# Patient Record
Sex: Male | Born: 1945
Health system: Southern US, Community
[De-identification: ages and names within clinical notes are randomized; demographics above are authoritative.]

## PROBLEM LIST (undated history)

## (undated) DIAGNOSIS — C44300 Unspecified malignant neoplasm of skin of unspecified part of face: Secondary | ICD-10-CM

## (undated) DIAGNOSIS — E785 Hyperlipidemia, unspecified: Secondary | ICD-10-CM

## (undated) DIAGNOSIS — I509 Heart failure, unspecified: Secondary | ICD-10-CM

## (undated) DIAGNOSIS — I4891 Unspecified atrial fibrillation: Secondary | ICD-10-CM

## (undated) DIAGNOSIS — N529 Male erectile dysfunction, unspecified: Secondary | ICD-10-CM

## (undated) DIAGNOSIS — I10 Essential (primary) hypertension: Secondary | ICD-10-CM

## (undated) DIAGNOSIS — K7460 Unspecified cirrhosis of liver: Secondary | ICD-10-CM

## (undated) DIAGNOSIS — R739 Hyperglycemia, unspecified: Secondary | ICD-10-CM

## (undated) DIAGNOSIS — K648 Other hemorrhoids: Secondary | ICD-10-CM

## (undated) DIAGNOSIS — C4492 Squamous cell carcinoma of skin, unspecified: Secondary | ICD-10-CM

## (undated) DIAGNOSIS — I219 Acute myocardial infarction, unspecified: Secondary | ICD-10-CM

## (undated) DIAGNOSIS — E079 Disorder of thyroid, unspecified: Secondary | ICD-10-CM

## (undated) HISTORY — DX: Hyperglycemia, unspecified: R73.9

## (undated) HISTORY — DX: Squamous cell carcinoma of skin, unspecified: C44.92

## (undated) HISTORY — DX: Essential (primary) hypertension: I10

## (undated) HISTORY — DX: Unspecified cirrhosis of liver: K74.60

## (undated) HISTORY — DX: Male erectile dysfunction, unspecified: N52.9

## (undated) HISTORY — DX: Hyperlipidemia, unspecified: E78.5

## (undated) HISTORY — DX: Acute myocardial infarction, unspecified: I21.9

## (undated) HISTORY — DX: Heart failure, unspecified: I50.9

## (undated) HISTORY — DX: Disorder of thyroid, unspecified: E07.9

## (undated) HISTORY — DX: Other hemorrhoids: K64.8

## (undated) HISTORY — DX: Unspecified malignant neoplasm of skin of unspecified part of face: C44.300

## (undated) HISTORY — DX: Unspecified atrial fibrillation: I48.91

---

## 1999-02-06 ENCOUNTER — Encounter: Payer: Self-pay | Admitting: Family Medicine

## 1999-02-06 LAB — CONVERTED CEMR LAB: PSA: 1.7 ng/mL

## 1999-03-31 LAB — HM COLONOSCOPY: HM Colonoscopy: NEGATIVE

## 1999-04-10 ENCOUNTER — Other Ambulatory Visit: Admission: RE | Admit: 1999-04-10 | Discharge: 1999-04-10 | Payer: Self-pay | Admitting: Gastroenterology

## 1999-04-10 ENCOUNTER — Encounter (INDEPENDENT_AMBULATORY_CARE_PROVIDER_SITE_OTHER): Payer: Self-pay | Admitting: Specialist

## 2006-10-29 HISTORY — PX: OTHER SURGICAL HISTORY: SHX169

## 2006-10-30 ENCOUNTER — Other Ambulatory Visit: Payer: Self-pay

## 2006-10-30 ENCOUNTER — Inpatient Hospital Stay: Payer: Self-pay | Admitting: Internal Medicine

## 2006-10-31 ENCOUNTER — Other Ambulatory Visit: Payer: Self-pay

## 2006-10-31 HISTORY — PX: DOPPLER ECHOCARDIOGRAPHY: SHX263

## 2006-11-08 ENCOUNTER — Ambulatory Visit: Payer: Self-pay | Admitting: Family Medicine

## 2006-11-08 LAB — CONVERTED CEMR LAB
Albumin: 3.8 g/dL (ref 3.5–5.2)
Basophils Absolute: 0.1 10*3/uL (ref 0.0–0.1)
Bilirubin, Direct: 0.2 mg/dL (ref 0.0–0.3)
Creatinine, Ser: 1.2 mg/dL (ref 0.4–1.5)
Eosinophils Absolute: 0.3 10*3/uL (ref 0.0–0.6)
Eosinophils Relative: 3.7 % (ref 0.0–5.0)
Glucose, Bld: 102 mg/dL — ABNORMAL HIGH (ref 70–99)
HCT: 44.9 % (ref 39.0–52.0)
HDL: 23.4 mg/dL — ABNORMAL LOW (ref >39.0)
Hemoglobin: 15.2 g/dL (ref 13.0–17.0)
MCHC: 33.9 g/dL (ref 30.0–36.0)
MCV: 89.9 fL (ref 78.0–100.0)
Magnesium: 2.3 mg/dL (ref 1.5–2.5)
Monocytes Absolute: 0.7 10*3/uL (ref 0.2–0.7)
Neutrophils Relative %: 61.3 % (ref 43.0–77.0)
PSA: 0.87 ng/mL (ref 0.10–4.00)
Potassium: 4.7 meq/L (ref 3.5–5.1)
RDW: 12.7 % (ref 11.5–14.6)
Sodium: 140 meq/L (ref 135–145)
Total Bilirubin: 1.4 mg/dL — ABNORMAL HIGH (ref 0.3–1.2)
Total Protein: 6.6 g/dL (ref 6.0–8.3)
Triglycerides: 129 mg/dL (ref 0–149)

## 2007-01-12 ENCOUNTER — Encounter: Payer: Self-pay | Admitting: Family Medicine

## 2007-01-12 DIAGNOSIS — I509 Heart failure, unspecified: Secondary | ICD-10-CM | POA: Insufficient documentation

## 2007-01-12 DIAGNOSIS — F528 Other sexual dysfunction not due to a substance or known physiological condition: Secondary | ICD-10-CM | POA: Insufficient documentation

## 2007-01-12 DIAGNOSIS — I4891 Unspecified atrial fibrillation: Secondary | ICD-10-CM | POA: Insufficient documentation

## 2007-02-01 ENCOUNTER — Ambulatory Visit: Payer: Self-pay | Admitting: Family Medicine

## 2007-02-01 DIAGNOSIS — I10 Essential (primary) hypertension: Secondary | ICD-10-CM | POA: Insufficient documentation

## 2007-02-22 ENCOUNTER — Ambulatory Visit: Payer: Self-pay | Admitting: Family Medicine

## 2007-02-22 LAB — CONVERTED CEMR LAB
CO2: 31 meq/L (ref 19–32)
Chloride: 108 meq/L (ref 96–112)
Creatinine, Ser: 1.2 mg/dL (ref 0.4–1.5)
Glucose, Bld: 100 mg/dL — ABNORMAL HIGH (ref 70–99)
Potassium: 4.7 meq/L (ref 3.5–5.1)
Sodium: 143 meq/L (ref 135–145)

## 2007-03-29 ENCOUNTER — Encounter: Payer: Self-pay | Admitting: Family Medicine

## 2007-05-24 ENCOUNTER — Ambulatory Visit: Payer: Self-pay | Admitting: Family Medicine

## 2007-08-16 ENCOUNTER — Ambulatory Visit: Payer: Self-pay | Admitting: Family Medicine

## 2007-08-16 LAB — CONVERTED CEMR LAB
CO2: 31 meq/L (ref 19–32)
Creatinine, Ser: 1 mg/dL (ref 0.4–1.5)
GFR calc Af Amer: 98 mL/min
Glucose, Bld: 105 mg/dL — ABNORMAL HIGH (ref 70–99)
Potassium: 4 meq/L (ref 3.5–5.1)
Sodium: 140 meq/L (ref 135–145)

## 2007-08-23 ENCOUNTER — Ambulatory Visit: Payer: Self-pay | Admitting: Family Medicine

## 2007-08-23 DIAGNOSIS — D485 Neoplasm of uncertain behavior of skin: Secondary | ICD-10-CM | POA: Insufficient documentation

## 2007-11-01 ENCOUNTER — Encounter: Payer: Self-pay | Admitting: Family Medicine

## 2007-12-20 ENCOUNTER — Ambulatory Visit: Payer: Self-pay | Admitting: Family Medicine

## 2007-12-20 LAB — CONVERTED CEMR LAB
ALT: 25 units/L (ref 0–53)
AST: 19 units/L (ref 0–37)
Albumin: 3.7 g/dL (ref 3.5–5.2)
Alkaline Phosphatase: 60 units/L (ref 39–117)
BUN: 18 mg/dL (ref 6–23)
Basophils Absolute: 0.1 10*3/uL (ref 0.0–0.1)
Basophils Relative: 0.9 % (ref 0.0–1.0)
Bilirubin, Direct: 0.2 mg/dL (ref 0.0–0.3)
CO2: 33 meq/L — ABNORMAL HIGH (ref 19–32)
Calcium: 9.2 mg/dL (ref 8.4–10.5)
Chloride: 100 meq/L (ref 96–112)
Cholesterol: 210 mg/dL (ref 0–200)
Creatinine, Ser: 1 mg/dL (ref 0.4–1.5)
Direct LDL: 127.3 mg/dL
Eosinophils Absolute: 0.4 10*3/uL (ref 0.0–0.6)
Eosinophils Relative: 5.1 % — ABNORMAL HIGH (ref 0.0–5.0)
GFR calc Af Amer: 98 mL/min
GFR calc non Af Amer: 81 mL/min
Glucose, Bld: 96 mg/dL (ref 70–99)
HCT: 42.5 % (ref 39.0–52.0)
HDL: 30 mg/dL — ABNORMAL LOW (ref >39.0)
Hemoglobin: 14 g/dL (ref 13.0–17.0)
Lymphocytes Relative: 26.3 % (ref 12.0–46.0)
MCHC: 33 g/dL (ref 30.0–36.0)
MCV: 89.2 fL (ref 78.0–100.0)
Monocytes Absolute: 0.6 10*3/uL (ref 0.2–0.7)
Monocytes Relative: 8.1 % (ref 3.0–11.0)
Neutro Abs: 4.6 10*3/uL (ref 1.4–7.7)
Neutrophils Relative %: 59.6 % (ref 43.0–77.0)
Platelets: 178 10*3/uL (ref 150–400)
Potassium: 3.9 meq/L (ref 3.5–5.1)
RBC: 4.77 M/uL (ref 4.22–5.81)
RDW: 12.6 % (ref 11.5–14.6)
Sodium: 139 meq/L (ref 135–145)
TSH: 5.1 microintl units/mL (ref 0.35–5.50)
Total Bilirubin: 2 mg/dL — ABNORMAL HIGH (ref 0.3–1.2)
Total CHOL/HDL Ratio: 7
Total Protein: 6.5 g/dL (ref 6.0–8.3)
Triglycerides: 149 mg/dL (ref 0–149)
VLDL: 30 mg/dL (ref 0–40)
WBC: 7.8 10*3/uL (ref 4.5–10.5)

## 2007-12-26 ENCOUNTER — Ambulatory Visit: Payer: Self-pay | Admitting: Family Medicine

## 2008-02-06 ENCOUNTER — Ambulatory Visit: Payer: Self-pay | Admitting: Family Medicine

## 2008-03-27 ENCOUNTER — Ambulatory Visit: Payer: Self-pay | Admitting: Family Medicine

## 2008-03-27 LAB — CONVERTED CEMR LAB
ALT: 26 units/L (ref 0–53)
AST: 21 units/L (ref 0–37)
HDL: 29.6 mg/dL — ABNORMAL LOW (ref >39.0)
Total CHOL/HDL Ratio: 5.6
VLDL: 37 mg/dL (ref 0–40)

## 2008-04-03 ENCOUNTER — Ambulatory Visit: Payer: Self-pay | Admitting: Family Medicine

## 2008-04-03 DIAGNOSIS — E7800 Pure hypercholesterolemia, unspecified: Secondary | ICD-10-CM | POA: Insufficient documentation

## 2008-05-07 ENCOUNTER — Encounter: Payer: Self-pay | Admitting: Family Medicine

## 2008-08-06 ENCOUNTER — Ambulatory Visit: Payer: Self-pay | Admitting: Family Medicine

## 2008-11-26 ENCOUNTER — Telehealth: Payer: Self-pay | Admitting: Family Medicine

## 2008-12-18 ENCOUNTER — Encounter: Payer: Self-pay | Admitting: Family Medicine

## 2009-01-01 ENCOUNTER — Encounter (INDEPENDENT_AMBULATORY_CARE_PROVIDER_SITE_OTHER): Payer: Self-pay | Admitting: *Deleted

## 2009-01-15 ENCOUNTER — Ambulatory Visit: Payer: Self-pay | Admitting: Family Medicine

## 2009-01-16 LAB — CONVERTED CEMR LAB
AST: 19 units/L (ref 0–37)
Albumin: 3.9 g/dL (ref 3.5–5.2)
BUN: 18 mg/dL (ref 6–23)
Basophils Absolute: 0 10*3/uL (ref 0.0–0.1)
CO2: 32 meq/L (ref 19–32)
Chloride: 103 meq/L (ref 96–112)
Direct LDL: 135.7 mg/dL
GFR calc non Af Amer: 72 mL/min (ref >60)
Glucose, Bld: 114 mg/dL — ABNORMAL HIGH (ref 70–99)
HCT: 42.6 % (ref 39.0–52.0)
Hemoglobin: 14.5 g/dL (ref 13.0–17.0)
Lymphs Abs: 2 10*3/uL (ref 0.7–4.0)
MCHC: 34 g/dL (ref 30.0–36.0)
MCV: 90.5 fL (ref 78.0–100.0)
Monocytes Absolute: 0.5 10*3/uL (ref 0.1–1.0)
Monocytes Relative: 6.6 % (ref 3.0–12.0)
Neutro Abs: 4.2 10*3/uL (ref 1.4–7.7)
Platelets: 166 10*3/uL (ref 150.0–400.0)
Potassium: 4 meq/L (ref 3.5–5.1)
Pro B Natriuretic peptide (BNP): 21 pg/mL (ref 0.0–100.0)
RDW: 12.4 % (ref 11.5–14.6)
Sodium: 140 meq/L (ref 135–145)
TSH: 5.65 microintl units/mL — ABNORMAL HIGH (ref 0.35–5.50)

## 2009-01-22 ENCOUNTER — Ambulatory Visit: Payer: Self-pay | Admitting: Family Medicine

## 2009-01-22 DIAGNOSIS — R739 Hyperglycemia, unspecified: Secondary | ICD-10-CM | POA: Insufficient documentation

## 2009-07-30 ENCOUNTER — Ambulatory Visit: Payer: Self-pay | Admitting: Family Medicine

## 2009-07-30 DIAGNOSIS — M65839 Other synovitis and tenosynovitis, unspecified forearm: Secondary | ICD-10-CM | POA: Insufficient documentation

## 2009-07-30 DIAGNOSIS — M65849 Other synovitis and tenosynovitis, unspecified hand: Secondary | ICD-10-CM

## 2010-01-21 ENCOUNTER — Encounter (INDEPENDENT_AMBULATORY_CARE_PROVIDER_SITE_OTHER): Payer: Self-pay | Admitting: *Deleted

## 2010-01-27 ENCOUNTER — Ambulatory Visit: Payer: Self-pay | Admitting: Family Medicine

## 2010-01-27 DIAGNOSIS — D126 Benign neoplasm of colon, unspecified: Secondary | ICD-10-CM | POA: Insufficient documentation

## 2010-02-04 ENCOUNTER — Ambulatory Visit: Payer: Self-pay | Admitting: Family Medicine

## 2010-02-05 LAB — CONVERTED CEMR LAB
ALT: 31 units/L (ref 0–53)
AST: 24 units/L (ref 0–37)
Albumin: 4.1 g/dL (ref 3.5–5.2)
Alkaline Phosphatase: 46 units/L (ref 39–117)
Chloride: 102 meq/L (ref 96–112)
Eosinophils Relative: 4.7 % (ref 0.0–5.0)
GFR calc non Af Amer: 77.41 mL/min (ref >60)
Glucose, Bld: 107 mg/dL — ABNORMAL HIGH (ref 70–99)
HCT: 40.8 % (ref 39.0–52.0)
Hemoglobin: 14.1 g/dL (ref 13.0–17.0)
LDL Cholesterol: 110 mg/dL — ABNORMAL HIGH (ref 0–99)
Lymphs Abs: 1.9 10*3/uL (ref 0.7–4.0)
Monocytes Relative: 8 % (ref 3.0–12.0)
Neutro Abs: 4.1 10*3/uL (ref 1.4–7.7)
Potassium: 4.1 meq/L (ref 3.5–5.1)
Sodium: 143 meq/L (ref 135–145)
Total Protein: 6.6 g/dL (ref 6.0–8.3)
VLDL: 29.8 mg/dL (ref 0.0–40.0)
WBC: 7 10*3/uL (ref 4.5–10.5)

## 2010-03-19 ENCOUNTER — Telehealth: Payer: Self-pay | Admitting: Family Medicine

## 2010-05-05 ENCOUNTER — Encounter (INDEPENDENT_AMBULATORY_CARE_PROVIDER_SITE_OTHER): Payer: Self-pay | Admitting: *Deleted

## 2010-06-30 ENCOUNTER — Encounter: Payer: Self-pay | Admitting: Internal Medicine

## 2010-09-25 ENCOUNTER — Telehealth: Payer: Self-pay | Admitting: Family Medicine

## 2010-10-28 NOTE — Letter (Signed)
Summary: Nadara Eaton letter  [facility]  [address] [place], [place] [address]   Phone: [phone]  Fax: [phone]       05/05/2010 MRN: [id number]  [facility] [address] [place], [place]  [address]  Dear Mr. Bluett,  [facility], and Eva announce the retirement of Arta Silence, M.D., from full-time practice at the [facility] office effective March 27, 2010 and his plans of returning part-time.  It is important to Dr. Hetty Ely and to our practice that you understand that [facility] has seven physicians in our office for your health care needs.  We will continue to offer the same exceptional care that you have today.    Dr. Hetty Ely has spoken to many of you about his plans for retirement and returning part-time in the fall.   We will continue to work with you through the transition to schedule appointments for you in the office and meet the high standards that [place] Dezza is committed to.   Again, it is with great pleasure that we share the news that Dr. Hetty Ely will return to [facility] in October of 2011 with a reduced schedule.    If you have any questions, or would like to request an appointment with one of our physicians, please call us at [phone] and press the option for Scheduling an appointment.  We take pleasure in providing you with excellent patient care and look forward to seeing you at your next office visit.  Our [facility] Physicians are:  Tillman Abide, M.D. Laurita Quint, M.D. Roxy Manns, M.D. Kerby Nora, M.D. Hannah Beat, M.D. Ruthe Mannan, M.D. We proudly welcomed Raechel Ache, M.D. and Eustaquio Boyden, M.D. to the practice in July/August 2011.  Sincerely,  [facility]

## 2010-10-28 NOTE — Assessment & Plan Note (Signed)
Summary: CPX/DLO   Vital Signs:  Patient profile:   65 year old male Height:      69 inches Weight:      193.50 pounds BMI:     28.68 Temp:     98.0 degrees F oral Pulse rate:   72 / minute Pulse rhythm:   irregular BP sitting:   120 / 70  (left arm) Cuff size:   regular  Vitals Entered By: Sydell Axon LPN (Jan 27, 1609 1:51 PM) CC: 30 Minute checkup, had a colonoscopy 10 years ago, due now   History of Present Illness: Pt here for Compm Exam. His son died last 2023-08-13..Marland KitchenLymphoma.  He feels well and has no complaints except his thumbs.   Preventive Screening-Counseling & Management  Alcohol-Tobacco     Alcohol drinks/day: <1     Alcohol type: Beer, occas wine     Smoking Status: never     Packs/Day: teen 2pyh     Passive Smoke Exposure: no  Caffeine-Diet-Exercise     Caffeine use/day: 2     Does Patient Exercise: yes     Type of exercise: walks 3-5 miles     Times/week: 4  Problems Prior to Update: 1)  Tendinitis, Left Thumb  (ICD-727.05) 2)  Hyperglycemia  (ICD-790.29) 3)  Hypercholesterolemia, Mixed  (ICD-272.0) 4)  Health Maintenance Exam  (ICD-V70.0) 5)  Neoplasm of Uncertain Behavior of Skin  (ICD-238.2) 6)  Hypertension, Benign Essential  (ICD-401.1) 7)  Alcohol Abuse, Hx Of, in Past, Quit 2/08  (ICD-V11.3) 8)  Erectile Dysfunction  (ICD-302.72) 9)  Hypertriglyceridemia/chol. 154/trig129/hdl28.4/ldl105  (ICD-272.1) 10)  Cardiomyopathy, Ef 25-35 %  (ICD-425.4) 11)  Congestive Heart Failure  (ICD-428.0) 12)  Atrial Fibrillation  (ICD-427.31)  Medications Prior to Update: 1)  Coreg 25 Mg Tabs (Carvedilol) .... One Tab By Mouth Two Times A Day 2)  Viagra 100 Mg  Tabs (Sildenafil Citrate) .... One Tab By Mouth One Hour Prior 3)  Hyzaar 100-25 Mg  Tabs (Losartan Potassium-Hctz) .[name].. 1 Daily By Mouth 4)  Furosemide 40 Mg  Tabs (Furosemide) .[name].. 1 Once Daily As Needed Edema 5)  Cardura 4 Mg  Tabs (Doxazosin Mesylate) .... Take One By Mouth Daily 6)  Eql Aspirin Ec  325 Mg  Tbec (Aspirin) .[name].. 1 Daily By Mouth 7)  Fish Oil 1200 Mg  Caps (Omega-3 Fatty Acids) .[name].. 1 Daily By Mouth 8)  Pravachol 40 Mg  Tabs (Pravastatin Sodium) .... One Tab By Mouth At Night 9)  Multivitamins   Tabs (Multiple Vitamin) .[name].. 1 Daily By Mouth 10)  Anusol-Hc 25 Mg Supp (Hydrocortisone Acetate) .... One Supp Per Rectum Three Times A Day As Needed.  Allergies: No Known Drug Allergies  Past History:  Past Medical History: Last updated: 01/12/2007 Atrial fibrillation Congestive heart failure  Past Surgical History: Last updated: 01/22/2009 Colonoscopy Polyp Bx. Negative, int. hemorrhoids 03/31/99 [facility] A-Fib Cardiomyopathy EF 25-35% 02/02-02/05/08 ECHO EF 25-35% Mod. M.R., mild-mod.TR 10/31/06  Family History: Last updated: 01/27/2010 Father dec 79 CHF S/P CABG(67) Pacer Incr BP Mother A 89 Htn Bilat Breast DZ s/p Mastectomy Bilat Colon Ca Brother A 60  Kathlene November)  Social History: Last updated: 01/27/2010 Occupation: [employer], Masonry business   [employer], [employer] Retired,Semi Married 2 sons   Son died 08-12-2009 from Lymphoma Former Smoker Alcohol use-yes abuse quit 10/2006 Alcohol use-no Drug use-no  Risk Factors: Alcohol Use: <1 (01/27/2010) Caffeine Use: 2 (01/27/2010) Exercise: yes (01/27/2010)  Risk Factors: Smoking Status: never (01/27/2010) Packs/Day:  teen 2pyh (01/27/2010) Passive Smoke Exposure: no (01/27/2010)  Family History: Father dec 79 CHF S/P CABG(67) Pacer Incr BP Mother A 89 Htn Bilat Breast DZ s/p Mastectomy Bilat Colon Ca Brother A 60  Kathlene November)  Social History: Occupation: Actor, Medical sales representative, [employer] Retired,Semi Married 2 sons   Son died 08/27/2009 from Lymphoma Former Smoker Alcohol use-yes abuse quit 10/2006 Alcohol use-no Drug use-no  Review of Systems General:  Complains of fatigue; denies chills, fever, sweats, weakness, and weight loss; mild. Eyes:   Complains of blurring; denies discharge and eye pain. ENT:  Denies decreased hearing, ear discharge, earache, and ringing in ears. CV:  Complains of shortness of breath with exertion; denies chest pain or discomfort, fainting, fatigue, palpitations, swelling of feet, and swelling of hands; depending on how much exertion. Resp:  Denies cough, shortness of breath, and wheezing. GI:  Complains of hemorrhoids; denies abdominal pain, bloody stools, change in bowel habits, constipation, dark tarry stools, diarrhea, indigestion, loss of appetite, nausea, vomiting, vomiting blood, and yellowish skin color; blood with hemms occas. GU:  Complains of nocturia; denies discharge, dysuria, and urinary frequency; stable. MS:  Denies joint pain, low back pain, muscle aches, and stiffness. Derm:  Denies dryness, itching, and rash; sees Derm. Neuro:  Denies numbness, poor balance, tingling, and tremors.  Physical Exam  General:  Well-developed,well-nourished,in no acute distress; alert,appropriate and cooperative throughout examination, pleasnt and conversant. Head:  Normocephalic and atraumatic without obvious abnormalities. No apparent alopecia or balding. Eyes:  Conjunctiva clear bilaterally.  Ears:  External ear exam shows no significant lesions or deformities.  Otoscopic examination reveals clear canals, tympanic membranes are intact bilaterally without bulging, retraction, inflammation or discharge. Hearing is grossly normal bilaterally. Nose:  External nasal examination shows no deformity or inflammation. Nasal mucosa are pink and moist without lesions or exudates. Mouth:  Oral mucosa and oropharynx without lesions or exudates.  Teeth in good repair. Neck:  No deformities, masses, or tenderness noted. Chest Wall:  No deformities, masses, tenderness or gynecomastia noted. Breasts:  No masses or gynecomastia noted Lungs:  Normal respiratory effort, chest expands symmetrically. Lungs are clear to  auscultation, no crackles or wheezes. Heart:  Irreg  rate and irregular rhythm. S1 and S2 normal without gallop, murmur, click, rub or other extra sounds. Abdomen:  Bowel sounds positive,abdomen soft and non-tender without masses, organomegaly or hernias noted. Rectal:  No external abnormalities noted. Normal sphincter tone. No rectal masses or tenderness. Mildly G pos. Mildly distended ext hemms. Genitalia:  Testes bilaterally descended without nodularity, tenderness or masses. No scrotal masses or lesions. No penis lesions or urethral discharge. Prostate:  Prostate gland firm and smooth, no enlargement, nodularity, tenderness, mass, asymmetry or induration. 20-30 gms. Msk:  No deformity or scoliosis noted of thoracic or lumbar spine.   Pulses:  R and L carotid,radial,femoral,dorsalis pedis and posterior tibial pulses are full and equal bilaterally Extremities:  Left thumb with prominence but no pain of the proximal head of the metacarpal. Tendons surrounding nonswollen but mildly tender. Neurologic:  No cranial nerve deficits noted. Station and gait are normal. Sensory, motor and coordinative functions appear intact. Skin:  Intact with multiple suspicious lesions. No  rash. He sees a Engineer, mining. Cervical Nodes:  No lymphadenopathy noted Inguinal Nodes:  No significant adenopathy Psych:  Cognition and judgment appear intact. Alert and cooperative with normal attention span and concentration. No apparent delusions, illusions, hallucinations. Mildly tearful talking about his son who died last Aug 28, 2023.  Impression & Recommendations:  Problem # 1:  HEALTH MAINTENANCE EXAM (ICD-V70.0) Assessment Comment Only  Reviewed preventive care protocols, scheduled due services, and updated immunizations. Will have labs done next week, report via phone tree.  Problem # 2:  COLONIC POLYPS (ICD-211.3) Assessment: Unchanged With mother's h/o colon ca and G pos today, refer to GI for redo  colonoscopy. Orders: Gastroenterology Referral (GI)  Problem # 3:  HYPERGLYCEMIA (ICD-790.29) Assessment: Unchanged  Will recheck fasting next week.  Labs Reviewed: Creat: 1.1 (01/15/2009)     Problem # 4:  HYPERCHOLESTEROLEMIA, MIXED (ICD-272.0) Assessment: Unchanged Will recheck next week. His updated medication list for this problem includes:    Pravachol 40 Mg Tabs (Pravastatin sodium) ..... One tab by mouth at night  Labs Reviewed: SGOT: 19 (01/15/2009)   SGPT: 26 (01/15/2009)   HDL:37.20 (01/15/2009), 29.6 (03/27/2008)  LDL:100 (03/27/2008), DEL (12/20/2007)  Chol:220 (01/15/2009), 166 (03/27/2008)  Trig:202.0 (01/15/2009), 184 (03/27/2008)  Problem # 5:  HYPERTENSION, BENIGN ESSENTIAL (ICD-401.1) Assessment: Improved Adequate. His updated medication list for this problem includes:    Coreg 25 Mg Tabs (Carvedilol) ..... One tab by mouth two times a day    Hyzaar 100-25 Mg Tabs (Losartan potassium-hctz) .[name]... 1 daily by mouth    Furosemide 40 Mg Tabs (Furosemide) .[name]... 1 once daily as needed edema    Cardura 4 Mg Tabs (Doxazosin mesylate) .[name]... Take one by mouth daily  BP today: 120/70 Prior BP: 150/84 (07/30/2009)  Labs Reviewed: K+: 4.0 (01/15/2009) Creat: : 1.1 (01/15/2009)   Chol: 220 (01/15/2009)   HDL: 37.20 (01/15/2009)   LDL: 100 (03/27/2008)   TG: 202.0 (01/15/2009)  Problem # 6:  HYPERTRIGLYCERIDEMIA/CHOL. 154/TRIG129/HDL28.4/LDL105 (ICD-272.1) Assessment: Unchanged Will recheck next week. His updated medication list for this problem includes:    Pravachol 40 Mg Tabs (Pravastatin sodium) ..... One tab by mouth at night  Labs Reviewed: SGOT: 19 (01/15/2009)   SGPT: 26 (01/15/2009)   HDL:37.20 (01/15/2009), 29.6 (03/27/2008)  LDL:100 (03/27/2008), DEL (12/20/2007)  Chol:220 (01/15/2009), 166 (03/27/2008)  Trig:202.0 (01/15/2009), 184 (03/27/2008)  Problem # 7:  CARDIOMYOPATHY, EF 25-35 % (ICD-425.4) Assessment: Unchanged Irreg rhythm today. EKG  done.  Problem # 8:  ATRIAL FIBRILLATION (ICD-427.31)  Appears back in Afib. Will send EKG to Dr Lady Gary...he is being seen next week. His updated medication list for this problem includes:    Coreg 25 Mg Tabs (Carvedilol) ..... One tab by mouth two times a day    Eql Aspirin Ec 325 Mg Tbec (Aspirin) .[name]... 1 daily by mouth  Complete Medication List: 1)  Coreg 25 Mg Tabs (Carvedilol) .... One tab by mouth two times a day 2)  Viagra 100 Mg Tabs (Sildenafil citrate) .... One tab by mouth one hour prior 3)  Hyzaar 100-25 Mg Tabs (Losartan potassium-hctz) .[name].. 1 daily by mouth 4)  Furosemide 40 Mg Tabs (Furosemide) .[name].. 1 once daily as needed edema 5)  Cardura 4 Mg Tabs (Doxazosin mesylate) .... Take one by mouth daily 6)  Eql Aspirin Ec 325 Mg Tbec (Aspirin) .[name].. 1 daily by mouth 7)  Fish Oil 1200 Mg Caps (Omega-3 fatty acids) .[name].. 1 daily by mouth 8)  Pravachol 40 Mg Tabs (Pravastatin sodium) .... One tab by mouth at night 9)  Multivitamins Tabs (Multiple vitamin) .[name].. 1 daily by mouth 10)  Anusol-hc 25 Mg Supp (Hydrocortisone acetate) .... One supp per rectum three times a day as needed.  Patient Instructions: 1)  Lab appt next Tues AM fasting. Report via phone tree. 2)  Refer  to GI  Current Allergies (reviewed today): No known allergies

## 2010-10-28 NOTE — Letter (Signed)
Summary: Pre Visit Letter Revised  [facility]  [address] [place], [place] [address]   Phone: [phone]  Fax: [phone]        06/30/2010 MRN: [id number]  [facility] [address] [place], [place]  [address]             Procedure Date:  11-21 AT 10:30am   Welcome to the Gastroenterology Division at [facility].    You are scheduled to see a nurse for your pre-procedure visit on 08-04-10 at 10:30am  on the 3rd floor at [facility], [address]  We ask that you try to arrive at our office 15 minutes prior to your appointment time to allow for check-in.  Please take a minute to review the attached form.  If you answer "Yes" to one or more of the questions on the first page, we ask that you call the person listed at your earliest opportunity.  If you answer "No" to all of the questions, please complete the rest of the form and bring it to your appointment.    Your nurse visit will consist of discussing your medical and surgical history, your immediate family medical history, and your medications.   If you are unable to list all of your medications on the form, please bring the medication bottles to your appointment and we will list them.  We will need to be aware of both prescribed and over the counter drugs.  We will need to know exact dosage information as well.    Please be prepared to read and sign documents such as consent forms, a financial agreement, and acknowledgement forms.  If necessary, and with your consent, a friend or relative is welcome to sit-in on the nurse visit with you.  Please bring your insurance card so that we may make a copy of it.  If your insurance requires a referral to see a specialist, please bring your referral form from your primary care physician.  No co-pay is required for this nurse visit.     If you cannot keep your appointment, please call [phone] to cancel or reschedule prior to your appointment date.  This allows  [place] the opportunity to schedule an appointment for another patient in need of care.    Thank you for choosing [facility] for your medical needs.  We appreciate the opportunity to care for you.  Please visit [place] at our website  to learn more about our practice.  Sincerely, The Gastroenterology Division

## 2010-10-28 NOTE — Letter (Signed)
Summary: [place] No Show Letter  [facility] [place]  [address] [place], [place] [address]   Phone: [phone]  Fax: [phone]    01/21/2010 MRN: [id number]  [facility] [address] [place], [place]  [address]   Dear [name] [name],   Our records indicate that you missed your scheduled appointment with ___lab__________________ on __4.26.11__________.  Please contact this office to reschedule your appointment as soon as possible.  It is important that you keep your scheduled appointments with your physician, so we can provide you the best care possible.  Please be advised that there may be a charge for "no show" appointments.    Sincerely,   [place] at [facility]

## 2010-10-28 NOTE — Progress Notes (Signed)
  Phone Note Call from Patient   Caller: Patient Details for Reason: Due for Colonoscopy Summary of Call: Patient was not ready back in MAY to schedule his Colonoscopy and asked me to call him back to schedule in August. I called patient to set up for August and now he wants to put it off a couple more months. Says he has some face cancers that he is dealing with right now and also Dr Lady Gary put him back on coumadin also for his Afib. I told him I would forward this back to me to get schedule in a month or two but that we really needed to get this scheduled. Will forward referral back and I will call the patient in one more month. Initial call taken by: Carlton Adam,  March 19, 2010 11:23 AM  Follow-up for Phone Call        Noted. Follow-up by: Shaune Leeks MD,  March 19, 2010 11:27 AM

## 2010-10-30 NOTE — Progress Notes (Signed)
Summary: Colonscopic appt declined...  Phone Note Outgoing Call   Summary of Call: Pt cancelled the Nov 2011 appt for Colonscopic.  Pt does not want to schedule the colonscopic.Marland KitchenMarland KitchenFYI to Dr. Hetty Ely.Daine Gip  September 25, 2010 4:20 PM  Initial call taken by: Daine Gip,  September 25, 2010 4:20 PM  Follow-up for Phone Call        noted. Follow-up by: Shaune Leeks MD,  September 25, 2010 4:53 PM

## 2010-11-26 ENCOUNTER — Encounter: Payer: Self-pay | Admitting: Family Medicine

## 2010-11-28 ENCOUNTER — Encounter: Payer: Self-pay | Admitting: Family Medicine

## 2010-12-09 NOTE — Letter (Signed)
Summary: [facility]  [facility]   Imported By: Maryln Gottron 12/04/2010 14:04:13  _____________________________________________________________________  External Attachment:    Type:   Image     Comment:   External Document

## 2010-12-09 NOTE — Letter (Signed)
Summary: [facility]   Imported By: Kassie Mends 12/01/2010 10:15:45  _____________________________________________________________________  External Attachment:    Type:   Image     Comment:   External Document

## 2011-02-13 NOTE — Assessment & Plan Note (Signed)
[facility]                           [place] OFFICE NOTE   NAME:[name] [name]                         MRN:          [id number]  DATE:11/08/2006                            DOB:          1946/01/21    A 65 year old, white male, here to reestablish, has not been for some  time now and is being seen in hospital followup. He was discharged from  [facility] recently for a cardiomyopathy with A fib presumably  secondary to alcohol use. He had congestive heart failure acutely when  he was seen. He was hospitalized February 2 and discharged on the 5th. I  was called by the hospitalist last week concerning him to let me know  that he would be coming here to followup. He has not been seen here  since August 2000. He has cough at night and has had it since he was  discharged from the hospital especially with lying down. He has  decreased his weight since being home and has been compliant with  everything he has been told to do including no alcohol intake since  discharge.   PAST MEDICAL HISTORY:  Significant for no hospitalizations or surgeries  prior. He denies rheumatic fever, Scarlet fever, strep infection,  pneumonia, diabetes, liver disease or thyroid disease. He has had  hypertension since 05-20-00diagnosed here and untreated until his  hospitalization. He has had asthma as a child. He has no known drug  allergies. Quit smoking as a teen and smoked minimally. He was drinking  6-8 beers a day prior to his hospitalization, none since, and does not  use drugs.   MEDICATIONS:  1. Altace 10 mg a day.  2. Coreg 6.25 mg b.i.d.  3. Lasix 40 mg a day as needed.  4. Coumadin 5 mg a day.  All started in the hospital. He also takes a baby aspirin and Tussionex  at night p.r.n.   REVIEW OF SYSTEMS:  Significant for wearing reading glasses for a long  time, over the counter. Had shortness of breath prior to his  hospitalization and palpitations  prior to knowing that he had atrial  fibrillation. He has occasional wheezing, has decreased appetite since  his hospitalization and has had bleeding from the rectum in the past  with known hemorrhoids. He has occasional rare reflux disease, uses  antacids, had a colonoscopy in 02-14-2002 which showed polyps and occasional  nocturia with decreased stream. Otherwise head, eyes, ears, nose and  throat, teeth and gums, cardiorespiratory, gastrointestinal and  genitourinary systems are noncontributory.   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 He is married, lives with his wife and has 2  children.   FAMILY HISTORY:  Father died at the age of 33 in 15-Feb-2003 of CHF. He had had  a bypass graft at 65 years of age. He had a pacer placed and had  elevated blood pressure. Mother is still alive at the  age of 67 with  hypertension and bilateral breast cancer in the past.   His last tetanus shot was March 2000.   PHYSICAL EXAMINATION:  GENERAL:  He is a well-developed, well-nourished,  65 year old, white male in no acute distress when seen.  VITAL SIGNS:  Temperature 96.7, pulse of 88, blood pressure 120/80,  weight of 182 at 69-1/4 inches with no known drug allergies.  HEENT:  Within normal limits except for mild cerumen, worse in the right  versus the left ear.  NECK:  Without adenopathy, thyroid is without nodularity.  LUNGS:  Clear to auscultation.  BACK:  Straight, nontender with no CVA tenderness.  HEART:  Regular rate and rhythm without murmur. Pulses are 2+  throughout. Carotids are without bruits. He does have a click and a left  upper sternal murmur heard as a 2/6 systolic murmur.  CHEST:  Symmetric.  ABDOMEN:  Soft, nontender, normal bowel sounds, no masses and mildly  obese.  Testicles are descended bilaterally without nodularity. No hernias or  adenopathy are  noted.  RECTAL:  Normal tone, guaiac negative stool and no mass with a  significant external hemorrhoid that does not bother him, 2-3  centimeters in circumference. Prostate is 30-40 grams, soft, smooth,  symmetric, raphe is intact without nodularity.  Muscle strength is 5/5 with range of motion, gait and mobility normal.  SKIN:  Without lesion except for actinic keratoses occasionally.  EXTREMITIES:  Without clubbing, cyanosis or edema.   ASSESSMENT:  Health maintenance physical examination with  cardiomyopathy, congestive heart failure, atrial fibrillation, alcohol  abuse now off all alcohol, elevated triglycerides and erectile  dysfunction.   PLAN:  Fasting labs today to be drawn. Will check his protime as well.To  ask Dr. Lady [name] about changing his Altace to an ARB for a possible ACE-  induced cough. Avoid salt. May use Viagra or Levitra if he so desires  and return here in 1-2 months. Return sooner if symptoms require.     [name] Silence, MD  Electronically Signed    RNS/MedQ  DD: 11/08/2006  DT: 11/09/2006  Job #: [id number]

## 2011-03-03 ENCOUNTER — Other Ambulatory Visit: Payer: Self-pay | Admitting: Family Medicine

## 2011-04-14 ENCOUNTER — Other Ambulatory Visit: Payer: Self-pay | Admitting: Family Medicine

## 2011-04-17 ENCOUNTER — Other Ambulatory Visit: Payer: Self-pay | Admitting: Family Medicine

## 2011-05-12 ENCOUNTER — Other Ambulatory Visit: Payer: Self-pay | Admitting: Family Medicine

## 2011-05-24 ENCOUNTER — Other Ambulatory Visit: Payer: Self-pay | Admitting: Family Medicine

## 2011-05-25 NOTE — Telephone Encounter (Signed)
Received refill request electronically from pharmacy. Is it okay to refill? Patient has not been seen in over a year. Patient has scheduled an appointment to see Dr. Para March and have lab work in October.

## 2011-05-25 NOTE — Telephone Encounter (Signed)
rx sent

## 2011-06-02 ENCOUNTER — Other Ambulatory Visit: Payer: Self-pay | Admitting: Family Medicine

## 2011-06-03 NOTE — Telephone Encounter (Signed)
OK for one month as long as pt keeps appt with Dr Para March next month for get aquainted and PE.

## 2011-06-29 ENCOUNTER — Other Ambulatory Visit: Payer: Self-pay

## 2011-07-07 ENCOUNTER — Encounter: Payer: Self-pay | Admitting: Family Medicine

## 2011-07-10 ENCOUNTER — Other Ambulatory Visit: Payer: Self-pay | Admitting: Family Medicine

## 2011-08-05 ENCOUNTER — Other Ambulatory Visit: Payer: Self-pay | Admitting: Family Medicine

## 2011-08-05 DIAGNOSIS — I4891 Unspecified atrial fibrillation: Secondary | ICD-10-CM

## 2011-08-11 ENCOUNTER — Other Ambulatory Visit (INDEPENDENT_AMBULATORY_CARE_PROVIDER_SITE_OTHER): Payer: Medicare Other

## 2011-08-11 DIAGNOSIS — Z79899 Other long term (current) drug therapy: Secondary | ICD-10-CM

## 2011-08-11 DIAGNOSIS — I4891 Unspecified atrial fibrillation: Secondary | ICD-10-CM

## 2011-08-11 LAB — COMPREHENSIVE METABOLIC PANEL
Alkaline Phosphatase: 46 U/L (ref 39–117)
BUN: 18 mg/dL (ref 6–23)
CO2: 31 mEq/L (ref 19–32)
Creatinine, Ser: 1 mg/dL (ref 0.4–1.5)
GFR: 81.6 mL/min (ref >60.00)
Glucose, Bld: 100 mg/dL — ABNORMAL HIGH (ref 70–99)
Total Bilirubin: 1.6 mg/dL — ABNORMAL HIGH (ref 0.3–1.2)
Total Protein: 6.9 g/dL (ref 6.0–8.3)

## 2011-08-11 LAB — LIPID PANEL
Cholesterol: 174 mg/dL (ref 0–200)
HDL: 44.9 mg/dL (ref >39.00)
Triglycerides: 133 mg/dL (ref 0.0–149.0)
VLDL: 26.6 mg/dL (ref 0.0–40.0)

## 2011-08-11 LAB — TSH: TSH: 4.55 u[IU]/mL (ref 0.35–5.50)

## 2011-08-13 ENCOUNTER — Encounter: Payer: Self-pay | Admitting: Family Medicine

## 2011-08-18 ENCOUNTER — Ambulatory Visit (INDEPENDENT_AMBULATORY_CARE_PROVIDER_SITE_OTHER): Payer: Medicare Other | Admitting: Family Medicine

## 2011-08-18 ENCOUNTER — Encounter: Payer: Self-pay | Admitting: Family Medicine

## 2011-08-18 ENCOUNTER — Encounter: Payer: Self-pay | Admitting: Gastroenterology

## 2011-08-18 VITALS — BP 144/80 | HR 55 | Temp 97.8°F | Wt 195.0 lb

## 2011-08-18 DIAGNOSIS — E7800 Pure hypercholesterolemia, unspecified: Secondary | ICD-10-CM

## 2011-08-18 DIAGNOSIS — F528 Other sexual dysfunction not due to a substance or known physiological condition: Secondary | ICD-10-CM

## 2011-08-18 DIAGNOSIS — Z0000 Encounter for general adult medical examination without abnormal findings: Secondary | ICD-10-CM | POA: Insufficient documentation

## 2011-08-18 DIAGNOSIS — D126 Benign neoplasm of colon, unspecified: Secondary | ICD-10-CM

## 2011-08-18 DIAGNOSIS — I10 Essential (primary) hypertension: Secondary | ICD-10-CM

## 2011-08-18 DIAGNOSIS — I4891 Unspecified atrial fibrillation: Secondary | ICD-10-CM

## 2011-08-18 DIAGNOSIS — Z1211 Encounter for screening for malignant neoplasm of colon: Secondary | ICD-10-CM

## 2011-08-18 DIAGNOSIS — R7309 Other abnormal glucose: Secondary | ICD-10-CM

## 2011-08-18 DIAGNOSIS — I509 Heart failure, unspecified: Secondary | ICD-10-CM

## 2011-08-18 DIAGNOSIS — N400 Enlarged prostate without lower urinary tract symptoms: Secondary | ICD-10-CM

## 2011-08-18 MED ORDER — PRAVASTATIN SODIUM 40 MG PO TABS: 40.0000 mg | ORAL_TABLET | Freq: Every day | ORAL | Status: DC

## 2011-08-18 MED ORDER — DOXAZOSIN MESYLATE 4 MG PO TABS: 4.0000 mg | ORAL_TABLET | Freq: Every day | ORAL | Status: DC

## 2011-08-18 NOTE — Assessment & Plan Note (Signed)
No change in meds.  Continue diet and exercise.

## 2011-08-18 NOTE — Assessment & Plan Note (Signed)
Rate controlled, feeling well.  Per cards.

## 2011-08-18 NOTE — Assessment & Plan Note (Signed)
Mild elevation, will follow, d/w pt about diet.

## 2011-08-18 NOTE — Assessment & Plan Note (Signed)
Referred.

## 2011-08-18 NOTE — Patient Instructions (Addendum)
See Shirlee Limerick about your referral before you leave today. Check with your insurance to see if they will cover the shingles shot. Check on the last date of your pneumonia shot.  You'll need it once after age 65.  Call me when you need refills.  Take care. Glad to see you.

## 2011-08-18 NOTE — Assessment & Plan Note (Addendum)
Flu and td up to date.  PNA at 65.  He'll check on shingles shot.  Refer for colonoscopy.  PSA declined.  Healthy habits encouraged.  I think the grief sx related to child's death are wnl and he doesn't appear truly depressed.

## 2011-08-18 NOTE — Assessment & Plan Note (Signed)
Sx controlled, mild enlargement on exam.  PSA declined and I think this is reasonable.

## 2011-08-18 NOTE — Progress Notes (Signed)
Welcome to medicare physical.    PMH and SH reviewed  ROS: See HPI, otherwise noncontributory.  Meds, vitals, and allergies reviewed.   Risk if depression reviewed with patient.  He doesn't appear to have depressive sx, but he does have grief sx related to the anniversary of his child's death this month.  Fall/safety/ADL data reviewed and he is low risk.  No vision or hearing problems known. Advance directive d/w pt.  He had talked with wife but doesn't have papers done.  I advised him to talk with her.  He's a full code based on our conversation.   BPH.  Minimal nocturia.  PSA prev neg.  No FH prostate cancer.  PSA options were discussed along with recent recs.  No indication for psa at this point, since patient is low risk and there is no FH of prosate CA.  He declined testing of PSA.  I clarifed with patient.  No FH colon cancer.  He had passed blood with known hemorrhoids.  He didn't have repeat colonoscopy last year.  We talked about this.  He would prefer referral to [facility] for convenience.    Hypertension:    Using medication without problems or lightheadedness: yes Chest pain with exertion:no Edema:no Short of breath:no  CHF.  1 pillow.  Doing well.  No CP/sob/ble edema.  Coumadin per cards.  Has f/u with cards in 3/12.    Elevated Cholesterol: Using medications without problems:yes Muscle aches: no Diet compliance:yes Exercise:yes  Hyperglycemia.  Mild, no dx DM2.  D/w pt about diet and exercise.    Meds, vitals, and allergies reviewed.   PMH and SH reviewed  ROS: See HPI.  Otherwise negative.    GEN: nad, alert and oriented, briefly tearful discussing his son but then regains composure.  HEENT: mucous membranes moist NECK: supple w/o LA CV: IRR. PULM: ctab, no inc wob ABD: soft, +bs EXT: no edema SKIN: no acute rash Prostate gland firm and smooth, mild enlargement, but nodularity, tenderness, mass, asymmetry or induration. Ext hemorrhoid noted.

## 2011-08-18 NOTE — Assessment & Plan Note (Signed)
No change in meds.  Continue diet and exercise.  

## 2011-08-18 NOTE — Assessment & Plan Note (Signed)
Cont current meds.

## 2011-08-18 NOTE — Assessment & Plan Note (Signed)
Compensated, per cards.

## 2011-09-08 ENCOUNTER — Encounter: Payer: Self-pay | Admitting: Gastroenterology

## 2011-09-08 ENCOUNTER — Ambulatory Visit (INDEPENDENT_AMBULATORY_CARE_PROVIDER_SITE_OTHER): Payer: Medicare Other | Admitting: Gastroenterology

## 2011-09-08 VITALS — BP 136/74 | HR 60 | Ht 71.0 in | Wt 195.0 lb

## 2011-09-08 DIAGNOSIS — Z800 Family history of malignant neoplasm of digestive organs: Secondary | ICD-10-CM

## 2011-09-08 DIAGNOSIS — K921 Melena: Secondary | ICD-10-CM

## 2011-09-08 DIAGNOSIS — Z7901 Long term (current) use of anticoagulants: Secondary | ICD-10-CM

## 2011-09-08 MED ORDER — PEG-KCL-NACL-NASULF-NA ASC-C 100 G PO SOLR: 1.0000 | Freq: Once | ORAL | Status: DC

## 2011-09-08 NOTE — Progress Notes (Signed)
History of Present Illness: This is a 65 year old male here today with his wife. He relates many years of intermittent bright red blood per rectum associated with bowel movements that he attributes to hemorrhoids. He previously underwent colonoscopy in July 2000 which revealed moderate size internal hemorrhoids, which were injected, and 2 sigmoid colon polyps-one hyperplastic and one hamartoma. He is maintained on warfarin anticoagulation for stable atrial fibrillation. His mother developed colon cancer in her late 46s. Denies weight loss, abdominal pain, constipation, diarrhea, change in stool caliber, melena, nausea, vomiting, dysphagia, reflux symptoms, chest pain.  Review of Systems: Pertinent positive and negative review of systems were noted in the above HPI section. All other review of systems were otherwise negative.  Current Medications, Allergies, Past Medical History, Past Surgical History, Family History and Social History were reviewed in [employer] record.  Physical Exam: General: Well developed , well nourished, no acute distress Head: Normocephalic and atraumatic Eyes:  sclerae anicteric, EOMI Ears: Normal auditory acuity Mouth: No deformity or lesions Neck: Supple, no masses or thyromegaly Lungs: Clear throughout to auscultation Heart: Regular rate and rhythm; no murmurs, rubs or bruits Abdomen: Soft, non tender and non distended. No masses, hepatosplenomegaly or hernias noted. Normal Bowel sounds Rectal: Deferred to colonoscopy Musculoskeletal: Symmetrical with no gross deformities  Skin: No lesions on visible extremities Pulses:  Normal pulses noted Extremities: No clubbing, cyanosis, edema or deformities noted Neurological: Alert oriented x 4, grossly nonfocal Cervical Nodes:  No significant cervical adenopathy Inguinal Nodes: No significant inguinal adenopathy Psychological:  Alert and cooperative. Normal mood and affect  Assessment and  Recommendations:  1. Long-term small-volume hematochezia likely secondary to hemorrhoids. Family history of colon cancer in his mother in her late 61s. Need to rule out colorectal neoplasms, proctitis and other disorders. The risks, benefits, and alternatives to colonoscopy with possible biopsy, possible destruction of internal hemorrhoids and possible polypectomy were discussed with the patient and they consent to proceed.   2. Long-term warfarin anticoagulation for atrial fibrillation. The risks, benefits and alternatives to a five-day hold of warfarin were discussed with the patient and he consents to proceed. Will obtain clearance for a five-day hold of warfarin from his prescribing cardiologist.

## 2011-09-08 NOTE — Patient Instructions (Addendum)
You have been scheduled for a Colonoscopy. See separate instructions.  Pick up your prep kit from your pharmacy.  You will be contaced by our office prior to your procedure for directions on holding your Coumadin/Warfarin.  If you do not hear from our office 1 week prior to your scheduled procedure, please call [phone] to discuss. cc: Crawford Givens, MD

## 2011-09-18 ENCOUNTER — Telehealth: Payer: Self-pay

## 2011-09-18 DIAGNOSIS — K921 Melena: Secondary | ICD-10-CM

## 2011-09-18 NOTE — Telephone Encounter (Signed)
We received a fax from Dr Lady Gary that it is ok to hold coumadin for 5 days prior to the colon scheduled for 10/27/11.  I have left him a voicemail to call back to discuss

## 2011-09-25 NOTE — Telephone Encounter (Signed)
Number busy

## 2011-09-28 MED ORDER — PEG-KCL-NACL-NASULF-NA ASC-C 100 G PO SOLR: 1.0000 | Freq: Once | ORAL | Status: DC

## 2011-09-28 NOTE — Telephone Encounter (Signed)
Notified patient's wife that her husband can come off coumadin 5 days prior to his procedure. She verbalized understanding and will tell her husband. Also she wanted [place] to sent the Movi prep to his pharmacy again or if we haven't already because the pharmacy states they have not received the request. Sent the prescription again to [facility].

## 2011-10-22 ENCOUNTER — Other Ambulatory Visit: Payer: Self-pay | Admitting: *Deleted

## 2011-10-22 ENCOUNTER — Other Ambulatory Visit: Payer: Self-pay

## 2011-10-22 MED ORDER — DOXAZOSIN MESYLATE 4 MG PO TABS: 4.0000 mg | ORAL_TABLET | Freq: Every day | ORAL | Status: DC

## 2011-10-22 MED ORDER — PRAVASTATIN SODIUM 40 MG PO TABS: 40.0000 mg | ORAL_TABLET | Freq: Every day | ORAL | Status: DC

## 2011-10-22 NOTE — Telephone Encounter (Signed)
Wife advised. 

## 2011-10-22 NOTE — Telephone Encounter (Signed)
pts wife said has changed insurance co and needs pravastatin 40 mg and doxazosin 4 mg (90 day supply ) faxed to [facility] pharmacy at [phone]. Must include member # [id number]. Needs to be sent today if possible. Explained to pts wife we request 24 hr call return. Pts wife wants call back at [phone] when sent in.

## 2011-10-26 ENCOUNTER — Other Ambulatory Visit: Payer: Self-pay | Admitting: Family Medicine

## 2011-10-26 MED ORDER — DOXAZOSIN MESYLATE 4 MG PO TABS: 4.0000 mg | ORAL_TABLET | Freq: Every day | ORAL | Status: DC

## 2011-10-26 MED ORDER — PRAVASTATIN SODIUM 40 MG PO TABS: 40.0000 mg | ORAL_TABLET | Freq: Every day | ORAL | Status: DC

## 2011-10-26 NOTE — Telephone Encounter (Signed)
Patient said called last week to ask about refills being sent to [facility] and that she called back and confirmed it had been faxed but [facility] stated they did not receive Friday or today.  Please call back at [phone]

## 2011-10-26 NOTE — Telephone Encounter (Signed)
Rx's re-sent.  Possibly could have been sent incorrectly to [facility].

## 2011-10-27 ENCOUNTER — Encounter: Payer: Self-pay | Admitting: Gastroenterology

## 2011-10-27 ENCOUNTER — Ambulatory Visit (AMBULATORY_SURGERY_CENTER): Payer: Medicare Other | Admitting: Gastroenterology

## 2011-10-27 DIAGNOSIS — Z800 Family history of malignant neoplasm of digestive organs: Secondary | ICD-10-CM

## 2011-10-27 DIAGNOSIS — K921 Melena: Secondary | ICD-10-CM

## 2011-10-27 DIAGNOSIS — K648 Other hemorrhoids: Secondary | ICD-10-CM

## 2011-10-27 DIAGNOSIS — K644 Residual hemorrhoidal skin tags: Secondary | ICD-10-CM

## 2011-10-27 MED ORDER — SODIUM CHLORIDE 0.9 % IV SOLN: 500.0000 mL | INTRAVENOUS | Status: DC

## 2011-10-27 NOTE — Op Note (Signed)
[facility] [address] [place], [place]  [address]  COLONOSCOPY PROCEDURE REPORT PATIENT:  [name], [name]  MR#:  [id number] BIRTHDATE:  1946-08-27, 65 yrs. old  GENDER:  male ENDOSCOPIST:  Judie Petit T. Russella Dar, MD, [facility]  PROCEDURE DATE:  10/27/2011 PROCEDURE:  Colon w/ inj sclerosis of hemorrhoids ASA CLASS:  Class III INDICATIONS:  1) hematochezia  2) family history of colon cancer: mother at 8. MEDICATIONS:   These medications were titrated to patient response per physician's verbal order, Fentanyl 100 mcg IV, Versed 10 mg IV DESCRIPTION OF PROCEDURE:   After the risks benefits and alternatives of the procedure were thoroughly explained, informed consent was obtained.  Digital rectal exam was performed and revealed external hemorrhoids.   The [id number] endoscope was introduced through the anus and advanced to the cecum, which was identified by both the appendix and ileocecal valve, without limitations.  The quality of the prep was excellent, using MoviPrep.  The instrument was then slowly withdrawn as the colon was fully examined. <<PROCEDUREIMAGES>> FINDINGS:  Moderate diverticulosis was found in the sigmoid colon. Otherwise normal colonoscopy without other polyps, masses, vascular ectasias, or inflammatory changes.  Internal hemorrhoids were found. They were moderately sized. Injection sclerosis of internal hemorrhoids with 4 cc of 23.4% saline injected well above the dentate line. Retroflexed views in the rectum revealed no other findings other than those already described.  The time to cecum =  1.75  minutes. The scope was then withdrawn (time = 10.25  min) from the patient and the procedure completed.  COMPLICATIONS:  None  ENDOSCOPIC IMPRESSION: 1) Moderate diverticulosis in the sigmoid colon 2) Internal and external hemorrhoids  RECOMMENDATIONS: 1) Hold aspirin, aspirin products, and anti-inflammatory medication for 2 weeks. 2) High fiber diet with  liberal fluid intake. 3) Resume Coumadin (warfarin) today and have your PT/INR checked within 1 week. 4) Repeat Colonoscopy in 5 years.  Venita Lick. Russella Dar, MD, [facility]  n. eSIGNED:   Venita Lick. Stark at 10/27/2011 09:39 AM  [facility], [phone]

## 2011-10-27 NOTE — Progress Notes (Signed)
Dr. Russella Dar notified that strip of a fib given to carepartner to take to cardiologist since pts wife thought pt had not been in a fib in a while. No orders received.Patient did not experience any of the following events: a burn prior to discharge; a fall within the facility; wrong site/side/patient/procedure/implant event; or a hospital transfer or hospital admission upon discharge from the facility. [phone]) Patient did not have preoperative order for IV antibiotic SSI prophylaxis. [phone])

## 2011-10-27 NOTE — Patient Instructions (Signed)
Hold aspirin, aspiring products and anti-inflam. meds fot 2 weeks.  Resume Coumadin today and have PT/INR checked within one week. FOLLOW DISCHARGE INSTRUCTIONS (BLUE AND GREEN SHEETS).[name]

## 2011-10-28 ENCOUNTER — Telehealth: Payer: Self-pay | Admitting: *Deleted

## 2011-10-28 NOTE — Telephone Encounter (Signed)
  Follow up Call-  Call back number 10/27/2011  Post procedure Call Back phone  # [phone]  Permission to leave phone message Yes     Patient questions:  Do you have a fever, pain , or abdominal swelling? no Pain Score  0 *  Have you tolerated food without any problems? yes  Have you been able to return to your normal activities? yes  Do you have any questions about your discharge instructions: Diet   no Medications  no Follow up visit  no  Do you have questions or concerns about your Care? no  Actions: * If pain score is 4 or above:

## 2012-01-12 ENCOUNTER — Encounter: Payer: Self-pay | Admitting: Family Medicine

## 2012-01-24 ENCOUNTER — Encounter: Payer: Self-pay | Admitting: Family Medicine

## 2012-01-27 ENCOUNTER — Telehealth: Payer: Self-pay

## 2012-01-27 NOTE — Telephone Encounter (Signed)
[name] [name] wife called to see what info she needed to get for [name] [name] to get shingles and pneumonia injections and does pneumonia shot have to be given prior to shingles injection. At [name] [name] 08/18/11 visit Dr Para March requested [name] [name] to ck with insurance co. to verify coverage of shingles injection and needed date of last pneumonia immunization. [name] [name] wife will get info and call back and ask at that time does pneumonia vaccine have to be given first.

## 2012-09-19 ENCOUNTER — Other Ambulatory Visit: Payer: Self-pay | Admitting: Family Medicine

## 2012-09-19 DIAGNOSIS — I4891 Unspecified atrial fibrillation: Secondary | ICD-10-CM

## 2012-09-19 DIAGNOSIS — I10 Essential (primary) hypertension: Secondary | ICD-10-CM

## 2012-09-27 ENCOUNTER — Other Ambulatory Visit: Payer: Medicare Other

## 2012-09-27 ENCOUNTER — Other Ambulatory Visit (INDEPENDENT_AMBULATORY_CARE_PROVIDER_SITE_OTHER): Payer: Medicare Other

## 2012-09-27 DIAGNOSIS — I10 Essential (primary) hypertension: Secondary | ICD-10-CM

## 2012-09-27 DIAGNOSIS — I4891 Unspecified atrial fibrillation: Secondary | ICD-10-CM

## 2012-09-27 LAB — LIPID PANEL
LDL Cholesterol: 117 mg/dL — ABNORMAL HIGH (ref 0–99)
Total CHOL/HDL Ratio: 5
Triglycerides: 131 mg/dL (ref 0.0–149.0)

## 2012-09-27 LAB — CBC WITH DIFFERENTIAL/PLATELET
Basophils Relative: 0.7 % (ref 0.0–3.0)
Eosinophils Relative: 4.4 % (ref 0.0–5.0)
Lymphocytes Relative: 31.2 % (ref 12.0–46.0)
Neutrophils Relative %: 54.3 % (ref 43.0–77.0)
Platelets: 148 10*3/uL — ABNORMAL LOW (ref 150.0–400.0)
RBC: 4.74 Mil/uL (ref 4.22–5.81)
WBC: 7.1 10*3/uL (ref 4.5–10.5)

## 2012-09-27 LAB — COMPREHENSIVE METABOLIC PANEL
ALT: 39 U/L (ref 0–53)
AST: 27 U/L (ref 0–37)
Calcium: 9.3 mg/dL (ref 8.4–10.5)
Chloride: 102 mEq/L (ref 96–112)
Creatinine, Ser: 1.2 mg/dL (ref 0.4–1.5)

## 2012-09-27 LAB — TSH: TSH: 3.2 u[IU]/mL (ref 0.35–5.50)

## 2012-10-04 ENCOUNTER — Encounter: Payer: Self-pay | Admitting: Family Medicine

## 2012-10-04 ENCOUNTER — Ambulatory Visit (INDEPENDENT_AMBULATORY_CARE_PROVIDER_SITE_OTHER): Payer: Medicare Other | Admitting: Family Medicine

## 2012-10-04 VITALS — BP 104/78 | HR 90 | Temp 97.7°F | Ht 69.25 in | Wt 197.8 lb

## 2012-10-04 DIAGNOSIS — Z0000 Encounter for general adult medical examination without abnormal findings: Secondary | ICD-10-CM

## 2012-10-04 DIAGNOSIS — I509 Heart failure, unspecified: Secondary | ICD-10-CM

## 2012-10-04 DIAGNOSIS — I10 Essential (primary) hypertension: Secondary | ICD-10-CM

## 2012-10-04 DIAGNOSIS — Z125 Encounter for screening for malignant neoplasm of prostate: Secondary | ICD-10-CM

## 2012-10-04 DIAGNOSIS — R7309 Other abnormal glucose: Secondary | ICD-10-CM

## 2012-10-04 DIAGNOSIS — E7800 Pure hypercholesterolemia, unspecified: Secondary | ICD-10-CM

## 2012-10-04 DIAGNOSIS — N400 Enlarged prostate without lower urinary tract symptoms: Secondary | ICD-10-CM

## 2012-10-04 LAB — PSA, MEDICARE: PSA: 1.04 ng/ml (ref 0.10–4.00)

## 2012-10-04 MED ORDER — PRAVASTATIN SODIUM 40 MG PO TABS: 40.0000 mg | ORAL_TABLET | Freq: Every day | ORAL | Status: DC

## 2012-10-04 MED ORDER — DOXAZOSIN MESYLATE 4 MG PO TABS: 4.0000 mg | ORAL_TABLET | Freq: Every day | ORAL | Status: DC

## 2012-10-04 MED ORDER — PNEUMOCOCCAL VAC POLYVALENT 25 MCG/0.5ML IJ INJ: 0.5000 mL | INJECTION | Freq: Once | INTRAMUSCULAR | Status: DC

## 2012-10-04 MED ORDER — ZOSTER VACCINE LIVE 19400 UNT/0.65ML ~~LOC~~ SOLR: 0.6500 mL | Freq: Once | SUBCUTANEOUS | Status: DC

## 2012-10-04 NOTE — Progress Notes (Signed)
I have personally reviewed the Medicare Annual Wellness questionnaire and have noted 1. The patient's medical and social history 2. Their use of alcohol, tobacco or illicit drugs 3. Their current medications and supplements 4. The patient's functional ability including ADL's, fall risks, home safety risks and hearing or visual             impairment. 5. Diet and physical activities 6. Evidence for depression or mood disorders  The patients weight, height, BMI have been recorded in the chart and visual acuity is per eye clinic.  I have made referrals, counseling and provided education to the patient based review of the above and I have provided the pt with a written personalized care plan for preventive services.  See scanned forms.  Routine anticipatory guidance given to patient.  See health maintenance. Flu prev done Shingles to be done at pharmacy PNA to be done at pharmacy Tetanus 2010 Colonoscopy 2013 Prostate cancer screening d/w pt.  Nocturia is at baseline, 0-1 per night.  Stream is slightly weaker overall, but not changed much in the last year.  It isn't more bothersome than it was.   Advance directive.  D/w pt about this.  Wife would be designated if incapacitated.   Cognitive function addressed- see scanned forms- and if abnormal then additional documentation follows.   Hyperglycemia d/w pt.  He'll work on cutting out sweets.  Labs d/w pt.    AF.  Per cards.  On coumadin.  No sig bleeding.  Hemoglobin wnl.  INRs per cards.    CHF, lasix added and much improved.  He takes it twice a week usually.  No CP, SOB, BLE edema.    Elevated Cholesterol: Using medications without problems:yes Muscle aches: no Diet compliance:yes Exercise: discussed, walking some, encouraged a little more  PMH and SH reviewed  Meds, vitals, and allergies reviewed.   ROS: See HPI.  Otherwise negative.    GEN: nad, alert and oriented HEENT: mucous membranes moist NECK: supple w/o LA CV: IRR,  not tachy PULM: ctab, no inc wob ABD: soft, +bs EXT: no edema SKIN: no acute rash

## 2012-10-04 NOTE — Patient Instructions (Addendum)
Go to the lab on the way out.  We'll contact you with your lab report.  Take care.  Work on cutting out sugars/sweets.  Glad to see you.  Recheck in 1 year.

## 2012-10-05 NOTE — Assessment & Plan Note (Signed)
Per cards.  Doing well.  No edema now.

## 2012-10-05 NOTE — Assessment & Plan Note (Signed)
Controlled, continue current meds.  Encouraged diet and exercise.

## 2012-10-05 NOTE — Assessment & Plan Note (Addendum)
D/w pt.  No meds.  Encouraged diet and exercise.  He understood.

## 2012-10-05 NOTE — Assessment & Plan Note (Signed)
Will continue current meds.  Encouraged diet and exercise.  He agrees.

## 2012-10-05 NOTE — Assessment & Plan Note (Signed)
  See scanned forms.  Routine anticipatory guidance given to patient.  See health maintenance. Flu prev done Shingles to be done at pharmacy PNA to be done at pharmacy Tetanus 2010 Colonoscopy 2013 Prostate cancer screening d/w pt.  Nocturia is at baseline, 0-1 per night.  Stream is slightly weaker overall, but not changed much in the last year.  It isn't more bothersome than it was.  PSA wnl.  Advance directive.  D/w pt about this.  Wife would be designated if incapacitated.   Cognitive function addressed- see scanned forms- and if abnormal then additional documentation follows.

## 2012-10-05 NOTE — Assessment & Plan Note (Signed)
psa wnl.  

## 2012-11-21 ENCOUNTER — Encounter: Payer: Self-pay | Admitting: Family Medicine

## 2013-07-04 ENCOUNTER — Encounter: Payer: Self-pay | Admitting: Family Medicine

## 2013-07-04 DIAGNOSIS — C4492 Squamous cell carcinoma of skin, unspecified: Secondary | ICD-10-CM | POA: Insufficient documentation

## 2013-08-21 ENCOUNTER — Encounter: Payer: Self-pay | Admitting: Family Medicine

## 2013-09-01 ENCOUNTER — Encounter: Payer: Self-pay | Admitting: Family Medicine

## 2013-09-24 ENCOUNTER — Other Ambulatory Visit: Payer: Self-pay | Admitting: Family Medicine

## 2013-09-24 DIAGNOSIS — I10 Essential (primary) hypertension: Secondary | ICD-10-CM

## 2013-09-24 DIAGNOSIS — I4891 Unspecified atrial fibrillation: Secondary | ICD-10-CM

## 2013-09-25 ENCOUNTER — Other Ambulatory Visit: Payer: Self-pay

## 2013-09-25 MED ORDER — DOXAZOSIN MESYLATE 4 MG PO TABS: 4.0000 mg | ORAL_TABLET | Freq: Every day | ORAL | Status: DC

## 2013-09-25 MED ORDER — PRAVASTATIN SODIUM 40 MG PO TABS: 40.0000 mg | ORAL_TABLET | Freq: Every day | ORAL | Status: DC

## 2013-09-25 NOTE — Telephone Encounter (Signed)
Pt's appt was changed to 10/31/2013, pt request refill # 30 of doxazosin and pravastatin to [facility] [address]. Advised pt done.

## 2013-09-29 ENCOUNTER — Other Ambulatory Visit: Payer: Self-pay | Admitting: *Deleted

## 2013-10-02 ENCOUNTER — Other Ambulatory Visit: Payer: Medicare Other

## 2013-10-09 ENCOUNTER — Encounter: Payer: Medicare Other | Admitting: Family Medicine

## 2013-10-17 ENCOUNTER — Ambulatory Visit: Payer: Self-pay | Admitting: Cardiology

## 2013-10-17 LAB — PROTIME-INR
INR: 1.7
PROTHROMBIN TIME: 19.8 s — AB (ref 11.5–14.7)

## 2013-10-24 ENCOUNTER — Other Ambulatory Visit (INDEPENDENT_AMBULATORY_CARE_PROVIDER_SITE_OTHER): Payer: Medicare HMO

## 2013-10-24 DIAGNOSIS — I10 Essential (primary) hypertension: Secondary | ICD-10-CM

## 2013-10-24 DIAGNOSIS — I4891 Unspecified atrial fibrillation: Secondary | ICD-10-CM

## 2013-10-24 LAB — COMPREHENSIVE METABOLIC PANEL
ALT: 24 U/L (ref 0–53)
AST: 20 U/L (ref 0–37)
Albumin: 4 g/dL (ref 3.5–5.2)
Alkaline Phosphatase: 112 U/L (ref 39–117)
BUN: 21 mg/dL (ref 6–23)
CHLORIDE: 103 meq/L (ref 96–112)
CO2: 31 meq/L (ref 19–32)
CREATININE: 1.3 mg/dL (ref 0.4–1.5)
Calcium: 9.2 mg/dL (ref 8.4–10.5)
GFR: 59.55 mL/min — AB (ref >60.00)
Glucose, Bld: 111 mg/dL — ABNORMAL HIGH (ref 70–99)
Potassium: 3.6 mEq/L (ref 3.5–5.1)
Sodium: 141 mEq/L (ref 135–145)
Total Bilirubin: 2.7 mg/dL — ABNORMAL HIGH (ref 0.3–1.2)
Total Protein: 7.3 g/dL (ref 6.0–8.3)

## 2013-10-24 LAB — LIPID PANEL
Cholesterol: 160 mg/dL (ref 0–200)
HDL: 38.9 mg/dL — AB (ref >39.00)
LDL Cholesterol: 106 mg/dL — ABNORMAL HIGH (ref 0–99)
TRIGLYCERIDES: 75 mg/dL (ref 0.0–149.0)
Total CHOL/HDL Ratio: 4
VLDL: 15 mg/dL (ref 0.0–40.0)

## 2013-10-24 LAB — CBC WITH DIFFERENTIAL/PLATELET
BASOS PCT: 0.7 % (ref 0.0–3.0)
Basophils Absolute: 0.1 10*3/uL (ref 0.0–0.1)
Eosinophils Absolute: 0.3 10*3/uL (ref 0.0–0.7)
Eosinophils Relative: 4.2 % (ref 0.0–5.0)
HCT: 37.6 % — ABNORMAL LOW (ref 39.0–52.0)
HEMOGLOBIN: 12.8 g/dL — AB (ref 13.0–17.0)
Lymphocytes Relative: 20.7 % (ref 12.0–46.0)
Lymphs Abs: 1.6 10*3/uL (ref 0.7–4.0)
MCHC: 34 g/dL (ref 30.0–36.0)
MCV: 89.5 fl (ref 78.0–100.0)
MONOS PCT: 8.2 % (ref 3.0–12.0)
Monocytes Absolute: 0.6 10*3/uL (ref 0.1–1.0)
NEUTROS ABS: 5.2 10*3/uL (ref 1.4–7.7)
Neutrophils Relative %: 66.2 % (ref 43.0–77.0)
Platelets: 144 10*3/uL — ABNORMAL LOW (ref 150.0–400.0)
RBC: 4.2 Mil/uL — AB (ref 4.22–5.81)
RDW: 14.6 % (ref 11.5–14.6)
WBC: 7.9 10*3/uL (ref 4.5–10.5)

## 2013-10-24 LAB — TSH: TSH: 12.96 u[IU]/mL — AB (ref 0.35–5.50)

## 2013-10-31 ENCOUNTER — Encounter: Payer: Self-pay | Admitting: Family Medicine

## 2013-10-31 ENCOUNTER — Ambulatory Visit (INDEPENDENT_AMBULATORY_CARE_PROVIDER_SITE_OTHER): Payer: Medicare HMO | Admitting: Family Medicine

## 2013-10-31 VITALS — BP 122/66 | HR 52 | Temp 97.9°F | Ht 68.25 in | Wt 191.2 lb

## 2013-10-31 DIAGNOSIS — I509 Heart failure, unspecified: Secondary | ICD-10-CM

## 2013-10-31 DIAGNOSIS — R7309 Other abnormal glucose: Secondary | ICD-10-CM

## 2013-10-31 DIAGNOSIS — I4891 Unspecified atrial fibrillation: Secondary | ICD-10-CM

## 2013-10-31 DIAGNOSIS — E7800 Pure hypercholesterolemia, unspecified: Secondary | ICD-10-CM

## 2013-10-31 DIAGNOSIS — Z0000 Encounter for general adult medical examination without abnormal findings: Secondary | ICD-10-CM

## 2013-10-31 DIAGNOSIS — I10 Essential (primary) hypertension: Secondary | ICD-10-CM

## 2013-10-31 MED ORDER — DOXAZOSIN MESYLATE 4 MG PO TABS: 4.0000 mg | ORAL_TABLET | Freq: Every day | ORAL | Status: DC

## 2013-10-31 MED ORDER — PRAVASTATIN SODIUM 40 MG PO TABS: 40.0000 mg | ORAL_TABLET | Freq: Every day | ORAL | Status: DC

## 2013-10-31 NOTE — Progress Notes (Signed)
Pre-visit discussion using our clinic review tool. No additional management support is needed unless otherwise documented below in the visit note.  I have personally reviewed the Medicare Annual Wellness questionnaire and have noted 1. The patient's medical and social history 2. Their use of alcohol, tobacco or illicit drugs 3. Their current medications and supplements 4. The patient's functional ability including ADL's, fall risks, home safety risks and hearing or visual             impairment. 5. Diet and physical activities 6. Evidence for depression or mood disorders  The patients weight, height, BMI have been recorded in the chart and visual acuity is per eye clinic.  I have made referrals, counseling and provided education to the patient based review of the above and I have provided the pt with a written personalized care plan for preventive services.  See scanned forms.  Routine anticipatory guidance given to patient.  See health maintenance. Flu 2014 Shingles 2014 PNA 2014 Tetanus 2010 Colonoscopy 2013 Prostate cancer screening and PSA options (with potential risks and benefits of testing vs not testing) were discussed along with recent recs/guidelines.  He declined testing PSA at this point. Advance directive d/w pt. Wife designated if incapacitated.  Cognitive function addressed- see scanned forms- and if abnormal then additional documentation follows.   Hypertension:    Using medication without problems or lightheadedness: yes Chest pain with exertion:no Edema:no Short of breath:no  AF s/p cardioversion and in NSR as far as patient knows. On amiodarone per cards, TSH d/w pt. No sx of hypothyroidism.   Sugar elevation, noted prev, pt knows he need to work on diet.  Discussed.    PMH and SH reviewed  Meds, vitals, and allergies reviewed.   ROS: See HPI.  Otherwise negative.    GEN: nad, alert and oriented HEENT: mucous membranes moist NECK: supple w/o LA CV:  rrr. PULM: ctab, no inc wob ABD: soft, +bs EXT: no edema SKIN: no acute rash

## 2013-10-31 NOTE — Patient Instructions (Signed)
Don't change your meds for now and I'll check on your thyroid test.   Glad to see you.

## 2013-11-01 ENCOUNTER — Telehealth: Payer: Self-pay | Admitting: Family Medicine

## 2013-11-01 NOTE — Telephone Encounter (Signed)
Relevant patient education mailed to patient.  

## 2013-11-01 NOTE — Assessment & Plan Note (Signed)
Appears euvolemic  

## 2013-11-01 NOTE — Assessment & Plan Note (Signed)
See scanned forms.  Routine anticipatory guidance given to patient.  See health maintenance. Flu 2014 Shingles 2014 PNA 2014 Tetanus 2010 Colonoscopy 2013 Prostate cancer screening and PSA options (with potential risks and benefits of testing vs not testing) were discussed along with recent recs/guidelines.  He declined testing PSA at this point. Advance directive d/w pt. Wife designated if incapacitated.  Cognitive function addressed- see scanned forms- and if abnormal then additional documentation follows.

## 2013-11-01 NOTE — Assessment & Plan Note (Signed)
Encouraged diet.  D/w pt.

## 2013-11-01 NOTE — Assessment & Plan Note (Signed)
Controlled, continue as is.  

## 2013-11-01 NOTE — Assessment & Plan Note (Signed)
Sounds to be NSR today, will d/w cards.  Pt aware. No TMG on exam.

## 2013-11-01 NOTE — Assessment & Plan Note (Signed)
Reasonable control, d/w pt.

## 2013-11-01 NOTE — Telephone Encounter (Signed)
Called and left message with staff for Dr. Ubaldo Glassing re:TSH.

## 2013-11-05 ENCOUNTER — Telehealth: Payer: Self-pay | Admitting: Family Medicine

## 2013-11-05 DIAGNOSIS — E039 Hypothyroidism, unspecified: Secondary | ICD-10-CM | POA: Insufficient documentation

## 2013-11-05 MED ORDER — LEVOTHYROXINE SODIUM 25 MCG PO TABS: 25.0000 ug | ORAL_TABLET | Freq: Every day | ORAL | Status: DC

## 2013-11-05 NOTE — Telephone Encounter (Signed)
Call pt. D/w cards. Would start treatment for hypothyroidism.  Start with 1 pill a day.  Recheck TSH in 8 weeks at a lab visit.  We'll likely have to adjust his dose mult times, but would start slowly.  Thanks.  rx sent.

## 2013-11-06 NOTE — Telephone Encounter (Signed)
Patient notified as instructed by telephone. Patient stated that he will call back after he gets the medication from his mail order and schedule the follow-up lab appointment. Patient stated that it takes about 10 days to get the medication.

## 2013-11-16 ENCOUNTER — Encounter: Payer: Self-pay | Admitting: Internal Medicine

## 2013-11-16 ENCOUNTER — Ambulatory Visit (INDEPENDENT_AMBULATORY_CARE_PROVIDER_SITE_OTHER): Payer: Medicare HMO | Admitting: Internal Medicine

## 2013-11-16 VITALS — BP 130/82 | HR 85 | Temp 98.2°F | Wt 194.8 lb

## 2013-11-16 DIAGNOSIS — J209 Acute bronchitis, unspecified: Secondary | ICD-10-CM

## 2013-11-16 MED ORDER — ALBUTEROL SULFATE HFA 108 (90 BASE) MCG/ACT IN AERS: 2.0000 | INHALATION_SPRAY | Freq: Four times a day (QID) | RESPIRATORY_TRACT | Status: DC | PRN

## 2013-11-16 MED ORDER — AMOXICILLIN 875 MG PO TABS: 875.0000 mg | ORAL_TABLET | Freq: Two times a day (BID) | ORAL | Status: DC

## 2013-11-16 MED ORDER — HYDROCODONE-HOMATROPINE 5-1.5 MG/5ML PO SYRP: 5.0000 mL | ORAL_SOLUTION | Freq: Three times a day (TID) | ORAL | Status: DC | PRN

## 2013-11-16 MED ORDER — PREDNISONE 10 MG PO TABS: ORAL_TABLET | ORAL | Status: DC

## 2013-11-16 MED ORDER — IPRATROPIUM-ALBUTEROL 0.5-2.5 (3) MG/3ML IN SOLN
3.0000 mL | Freq: Once | RESPIRATORY_TRACT | Status: AC
  Administered 2013-11-16: 3 mL via RESPIRATORY_TRACT

## 2013-11-16 NOTE — Progress Notes (Signed)
Pre-visit discussion using our clinic review tool. No additional management support is needed unless otherwise documented below in the visit note.  

## 2013-11-16 NOTE — Progress Notes (Signed)
HPI  Pt presents to the clinic today with concerns of chest congestion. Symptoms started yesterday with productive cough and shortness of breath. Over the nights symptoms increased with chest tightness, difficulty breathing, sore throat, and wheezing. He denies fever, chills, fatigue, malaise, nasal discharge, or nasal congestion. He did try taking an OTC tylenol with little relief.      Past Medical History  Diagnosis Date  . Atrial fibrillation   . CHF (congestive heart failure)   . Hyperlipidemia   . Hypertension   . Hyperglycemia   . ED (erectile dysfunction)   . Skin cancer of face     2012 basal cell  . Internal hemorrhoids   . SCCA (squamous cell carcinoma) of skin     Left ear.  MOHS surgery 2014 [facility]    Family History  Problem Relation Age of Onset  . Hypertension Mother   . Cancer Mother     Bilat. breast dz, s/p mastectomy, Bilat  . Colon cancer Mother   . Colon polyps Mother   . Heart disease Father     CHF, S/P CABG (67) Pacer Incr BP  . Prostate cancer Neg Hx   . Heart disease Brother     CAD- stent and ICD/pacer    History   Social History  . Marital Status: Married    Spouse Name: N/A    Number of Children: 2  . Years of Education: N/A   Occupational History  . [employer], [employer] business   . 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 Smoking status: Never Smoker   . Smokeless tobacco: Never Used  . Alcohol Use: 7.2 oz/week    12 Glasses of wine per week     Comment: h/o abuse, quit 2008, rare use as of 2014  . Drug Use: No  . Sexual Activity: Not on file   Other Topics Concern  . Not on file   Social History Narrative   2 sons initially, 1 died 2009-08-13 from [place].   From [place], [place]   Works 3 days a week at [employer]   Enjoys working in the yard   Married 1967   Caffeine daily    No Known Allergies   Constitutional: Denies headache, fatigue and fever.  Denies abrupt weight changes.  HEENT:  Positive sore throat. Denies eye redness, eye pain, pressure behind the eyes, facial pain, nasal congestion, ear pain, ringing in the ears, wax buildup, runny nose or bloody nose. Respiratory: Positive productive cough with yellow sputum , shortness of breath, difficulty breathing. Cardiovascular:Endorses chest tightness. Denies chest pain, palpitations or swelling in the hands or feet.   No other specific complaints in a complete review of systems (except as listed in HPI above).  Objective:   BP 130/82  Pulse 85  Temp(Src) 98.2 F (36.8 C) (Oral)  Wt 194 lb 12 oz (88.338 kg)  SpO2 98% Wt Readings from Last 3 Encounters:  11/16/13 194 lb 12 oz (88.338 kg)  10/31/13 191 lb 4 oz (86.75 kg)  10/04/12 197 lb 12 oz (89.699 kg)     General: Appears his stated age, well developed, well nourished in NAD. HEENT: Head: normal shape and size; Eyes: sclera white, no icterus, conjunctiva pink, PERRLA and EOMs intact; Ears: Tm's gray and intact, normal light reflex; Nose: mucosa pink and moist, septum midline; Throat/Mouth: + PND. Teeth present, mucosa erythematous and moist, no exudate noted, no lesions or  ulcerations noted.  Neck: Mild cervical lymphadenopathy. Neck supple, trachea midline. No massses, lumps or thyromegaly present.  Cardiovascular: Normal rate and rhythm. S1,S2 noted.  No murmur, rubs or gallops noted. No JVD or BLE edema. No carotid bruits noted. Pulmonary/Chest: Normal effort and positive vesicular breath sounds. Dsypnea at rest. Inspiratory and Expiratory wheezing throughout all lobes. Rhonchi/crackles to bilateral upper lobes.  No respiratory distress.      Assessment & Plan:   Acute Bronchitis: Duoneb in office for acute wheezing Rx for Amoxicillin times 10 days Rx Albuterol inhaler 2 puffs every 6hrs as needed for wheezing Rx Prednisone taper for 6 days Get some rest and drink plenty of water Follow up in 2-3 days if no symptom  improvement  Ertha Nabor S, Student-NP

## 2013-11-16 NOTE — Patient Instructions (Addendum)
Acute Bronchitis Bronchitis is inflammation of the airways that extend from the windpipe into the lungs (bronchi). The inflammation often causes mucus to develop. This leads to a cough, which is the most common symptom of bronchitis.  In acute bronchitis, the condition usually develops suddenly and goes away over time, usually in a couple weeks. Smoking, allergies, and asthma can make bronchitis worse. Repeated episodes of bronchitis may cause further lung problems.  CAUSES Acute bronchitis is most often caused by the same virus that causes a cold. The virus can spread from person to person (contagious).  SIGNS AND SYMPTOMS   Cough.   Fever.   Coughing up mucus.   Body aches.   Chest congestion.   Chills.   Shortness of breath.   Sore throat.  DIAGNOSIS  Acute bronchitis is usually diagnosed through a physical exam. Tests, such as chest X-rays, are sometimes done to rule out other conditions.  TREATMENT  Acute bronchitis usually goes away in a couple weeks. Often times, no medical treatment is necessary. Medicines are sometimes given for relief of fever or cough. Antibiotics are usually not needed but may be prescribed in certain situations. In some cases, an inhaler may be recommended to help reduce shortness of breath and control the cough. A cool mist vaporizer may also be used to help thin bronchial secretions and make it easier to clear the chest.  HOME CARE INSTRUCTIONS  Get plenty of rest.   Drink enough fluids to keep your urine clear or pale yellow (unless you have a medical condition that requires fluid restriction). Increasing fluids may help thin your secretions and will prevent dehydration.   Only take over-the-counter or prescription medicines as directed by your health care provider.   Avoid smoking and secondhand smoke. Exposure to cigarette smoke or irritating chemicals will make bronchitis worse. If you are a smoker, consider using nicotine gum or skin  patches to help control withdrawal symptoms. Quitting smoking will help your lungs heal faster.   Reduce the chances of another bout of acute bronchitis by washing your hands frequently, avoiding people with cold symptoms, and trying not to touch your hands to your mouth, nose, or eyes.   Follow up with your health care provider as directed.  SEEK MEDICAL CARE IF: Your symptoms do not improve after 1 week of treatment.  SEEK IMMEDIATE MEDICAL CARE IF:  You develop an increased fever or chills.   You have chest pain.   You have severe shortness of breath.  You have bloody sputum.   You develop dehydration.  You develop fainting.  You develop repeated vomiting.  You develop a severe headache. MAKE SURE YOU:   Understand these instructions.  Will watch your condition.  Will get help right away if you are not doing well or get worse. Document Released: 10/22/2004 Document Revised: 05/17/2013 Document Reviewed: 03/07/2013 ExitCare Patient Information 2014 [employer].  

## 2013-11-16 NOTE — Progress Notes (Signed)
HPI  Pt presents to the clinic today with c/o cough, chest congestion, wheezing and sore throat. He reports this started about 2 days ago. The cough is productive of yellow mucous. He does report that his chest feels heavy. He has taken OTC tylenol with some relief. He has no history of allergies or asthma. He does have a history of CHF.  Review of Systems      Past Medical History  Diagnosis Date  . Atrial fibrillation   . CHF (congestive heart failure)   . Hyperlipidemia   . Hypertension   . Hyperglycemia   . ED (erectile dysfunction)   . Skin cancer of face     2012 basal cell  . Internal hemorrhoids   . SCCA (squamous cell carcinoma) of skin     Left ear.  MOHS surgery 2014 [facility]    Family History  Problem Relation Age of Onset  . Hypertension Mother   . Cancer Mother     Bilat. breast dz, s/p mastectomy, Bilat  . Colon cancer Mother   . Colon polyps Mother   . Heart disease Father     CHF, S/P CABG (67) Pacer Incr BP  . Prostate cancer Neg Hx   . Heart disease Brother     CAD- stent and ICD/pacer    History   Social History  . Marital Status: Married    Spouse Name: N/A    Number of Children: 2  . Years of Education: N/A   Occupational History  . [employer], [employer] business   . 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 Smoking status: Never Smoker   . Smokeless tobacco: Never Used  . Alcohol Use: 7.2 oz/week    12 Glasses of wine per week     Comment: h/o abuse, quit 2008, rare use as of 2014  . Drug Use: No  . Sexual Activity: Not on file   Other Topics Concern  . Not on file   Social History Narrative   2 sons initially, 1 died 2009/08/20 from [place].   From [place], [place]   Works 3 days a week at [employer]   Enjoys working in the yard   Married 1967   Caffeine daily    No Known Allergies   Constitutional: Positive headache, fatigue. Denies fever or abrupt weight  changes.  HEENT:  Positive sore throat. Denies eye redness, eye pain, pressure behind the eyes, facial pain, nasal congestion, ear pain, ringing in the ears, wax buildup, runny nose or bloody nose. Respiratory: Positive cough. Denies difficulty breathing or shortness of breath.  Cardiovascular: Pt reports chest tightness. Denies chest pain,  palpitations or swelling in the hands or feet.   No other specific complaints in a complete review of systems (except as listed in HPI above).  Objective:   BP 130/82  Pulse 85  Temp(Src) 98.2 F (36.8 C) (Oral)  Wt 194 lb 12 oz (88.338 kg)  SpO2 98% Wt Readings from Last 3 Encounters:  11/16/13 194 lb 12 oz (88.338 kg)  10/31/13 191 lb 4 oz (86.75 kg)  10/04/12 197 lb 12 oz (89.699 kg)     General: Appears his stated age, well developed, well nourished in NAD. HEENT: Head: normal shape and size; Eyes: sclera white, no icterus, conjunctiva pink, PERRLA and EOMs intact; Ears: Tm's gray and intact, normal light reflex; Nose: mucosa pink and moist, septum midline; Throat/Mouth: + PND. Teeth present,  mucosa erythematous and moist, no exudate noted, no lesions or ulcerations noted.  Neck: Neck supple, trachea midline. No massses, lumps or thyromegaly present.  Cardiovascular: Normal rate and rhythm. S1,S2 noted.  No murmur, rubs or gallops noted. No JVD or BLE edema. No carotid bruits noted. Pulmonary/Chest: Normal effort and bilateral inspiratory and expiratory wheezing, rhonchi noted in the RUL. No respiratory distress.      Assessment & Plan:   Acute Bronchitis:  Get some rest and drink plenty of water Do salt water gargles for the sore throat eRx for Amoxil BID x 10 days eRx for albuterol inhaler Neb treatment in office eRx for pred taper  RTC as needed or if symptoms persist.

## 2013-11-20 NOTE — Telephone Encounter (Signed)
Pt called back and scheduled lab test TSH on 12/25/13 at 8:50 am.

## 2013-12-25 ENCOUNTER — Other Ambulatory Visit: Payer: Self-pay | Admitting: Family Medicine

## 2013-12-25 ENCOUNTER — Other Ambulatory Visit (INDEPENDENT_AMBULATORY_CARE_PROVIDER_SITE_OTHER): Payer: Medicare HMO

## 2013-12-25 DIAGNOSIS — E039 Hypothyroidism, unspecified: Secondary | ICD-10-CM

## 2013-12-25 LAB — TSH: TSH: 8.66 u[IU]/mL — AB (ref 0.35–5.50)

## 2013-12-25 MED ORDER — LEVOTHYROXINE SODIUM 25 MCG PO TABS: 50.0000 ug | ORAL_TABLET | Freq: Every day | ORAL | Status: DC

## 2013-12-26 ENCOUNTER — Other Ambulatory Visit: Payer: Self-pay | Admitting: *Deleted

## 2013-12-26 DIAGNOSIS — E039 Hypothyroidism, unspecified: Secondary | ICD-10-CM

## 2013-12-26 MED ORDER — LEVOTHYROXINE SODIUM 25 MCG PO TABS: 50.0000 ug | ORAL_TABLET | Freq: Every day | ORAL | Status: DC

## 2014-01-15 ENCOUNTER — Telehealth: Payer: Self-pay | Admitting: Family Medicine

## 2014-01-15 DIAGNOSIS — E039 Hypothyroidism, unspecified: Secondary | ICD-10-CM

## 2014-01-15 NOTE — Telephone Encounter (Signed)
Recheck TSH in 2 months.  Continue the levothyroxine as is for now.  Thanks. Order is in.

## 2014-01-15 NOTE — Telephone Encounter (Signed)
Pt's cardiologist took him off Amodarone. He is wanting to know if he needs to change his levothyroxine dosage because Amodarone is the medication that ran his thyroid up so high. Please advise.

## 2014-01-16 NOTE — Telephone Encounter (Signed)
Patient advised.  Lab appt scheduled.  

## 2014-02-20 ENCOUNTER — Other Ambulatory Visit: Payer: Medicare HMO

## 2014-03-19 ENCOUNTER — Other Ambulatory Visit (INDEPENDENT_AMBULATORY_CARE_PROVIDER_SITE_OTHER): Payer: Medicare HMO

## 2014-03-19 DIAGNOSIS — E039 Hypothyroidism, unspecified: Secondary | ICD-10-CM

## 2014-03-19 LAB — TSH: TSH: 11.93 u[IU]/mL — ABNORMAL HIGH (ref 0.35–4.50)

## 2014-03-21 ENCOUNTER — Other Ambulatory Visit: Payer: Self-pay | Admitting: *Deleted

## 2014-03-21 ENCOUNTER — Other Ambulatory Visit: Payer: Self-pay | Admitting: Family Medicine

## 2014-03-21 DIAGNOSIS — E039 Hypothyroidism, unspecified: Secondary | ICD-10-CM

## 2014-03-21 MED ORDER — LEVOTHYROXINE SODIUM 75 MCG PO TABS: 75.0000 ug | ORAL_TABLET | Freq: Every day | ORAL | Status: DC

## 2014-03-21 NOTE — Telephone Encounter (Signed)
Rx sent to pharmacy per Dr. Damita Dunnings and patient's request.

## 2014-05-15 ENCOUNTER — Other Ambulatory Visit (INDEPENDENT_AMBULATORY_CARE_PROVIDER_SITE_OTHER): Payer: Medicare HMO

## 2014-05-15 DIAGNOSIS — E039 Hypothyroidism, unspecified: Secondary | ICD-10-CM

## 2014-05-15 LAB — TSH: TSH: 9.23 u[IU]/mL — ABNORMAL HIGH (ref 0.35–4.50)

## 2014-05-16 ENCOUNTER — Other Ambulatory Visit: Payer: Self-pay | Admitting: Family Medicine

## 2014-05-16 DIAGNOSIS — E039 Hypothyroidism, unspecified: Secondary | ICD-10-CM

## 2014-05-16 MED ORDER — LEVOTHYROXINE SODIUM 75 MCG PO TABS: 75.0000 ug | ORAL_TABLET | Freq: Every day | ORAL | Status: DC

## 2014-06-15 ENCOUNTER — Ambulatory Visit (INDEPENDENT_AMBULATORY_CARE_PROVIDER_SITE_OTHER): Payer: Medicare HMO | Admitting: Family Medicine

## 2014-06-15 ENCOUNTER — Encounter: Payer: Self-pay | Admitting: Family Medicine

## 2014-06-15 VITALS — BP 124/84 | HR 82 | Temp 97.7°F | Wt 195.8 lb

## 2014-06-15 DIAGNOSIS — R5381 Other malaise: Secondary | ICD-10-CM

## 2014-06-15 DIAGNOSIS — R5383 Other fatigue: Secondary | ICD-10-CM

## 2014-06-15 DIAGNOSIS — E039 Hypothyroidism, unspecified: Secondary | ICD-10-CM

## 2014-06-15 LAB — CBC WITH DIFFERENTIAL/PLATELET
BASOS PCT: 0.5 % (ref 0.0–3.0)
Basophils Absolute: 0 10*3/uL (ref 0.0–0.1)
EOS ABS: 0.2 10*3/uL (ref 0.0–0.7)
Eosinophils Relative: 2.6 % (ref 0.0–5.0)
HCT: 38.1 % — ABNORMAL LOW (ref 39.0–52.0)
Hemoglobin: 12.7 g/dL — ABNORMAL LOW (ref 13.0–17.0)
LYMPHS ABS: 1.3 10*3/uL (ref 0.7–4.0)
Lymphocytes Relative: 21.4 % (ref 12.0–46.0)
MCHC: 33.4 g/dL (ref 30.0–36.0)
MCV: 93.5 fl (ref 78.0–100.0)
MONO ABS: 0.5 10*3/uL (ref 0.1–1.0)
Monocytes Relative: 7.6 % (ref 3.0–12.0)
Neutro Abs: 4.1 10*3/uL (ref 1.4–7.7)
Neutrophils Relative %: 67.9 % (ref 43.0–77.0)
Platelets: 111 10*3/uL — ABNORMAL LOW (ref 150.0–400.0)
RBC: 4.07 Mil/uL — AB (ref 4.22–5.81)
RDW: 14.4 % (ref 11.5–15.5)
WBC: 6 10*3/uL (ref 4.0–10.5)

## 2014-06-15 LAB — TSH: TSH: 11.03 u[IU]/mL — ABNORMAL HIGH (ref 0.35–4.50)

## 2014-06-15 NOTE — Progress Notes (Signed)
Pre visit review using our clinic review tool, if applicable. No additional management support is needed unless otherwise documented below in the visit note.  H/o hypothyroidism. Off amiodarone.  Less energy in the last month.  Sleepy.  Fatigued.  Less exercise tolerance.  This doesn't seem to be a fluid overload, since he had increased his lasix prev and his weight didn't change much. He has back on his regular dose, of lasix, 20mg  a day.  No CP.    He is back in AF and is off amiodarone.  He is considering ablation with cards.    No blood in stool other than from hemorrhoids, small amount of episodic blood loss with that.    Meds, vitals, and allergies reviewed.   ROS: See HPI.  Otherwise, noncontributory.  GEN: nad, alert and oriented HEENT: mucous membranes moist NECK: supple w/o LA CV: IRR, not tachy. PULM: ctab, no inc wob ABD: soft, +bs EXT: trace edema in BLE SKIN: no acute rash

## 2014-06-15 NOTE — Patient Instructions (Signed)
Don't change your meds for now.  Go to the lab on the way out.  We'll contact you with your lab report. We'll go from there. Take care.

## 2014-06-17 DIAGNOSIS — R5381 Other malaise: Secondary | ICD-10-CM | POA: Insufficient documentation

## 2014-06-17 DIAGNOSIS — R5383 Other fatigue: Secondary | ICD-10-CM

## 2014-06-17 MED ORDER — LEVOTHYROXINE SODIUM 75 MCG PO TABS: 75.0000 ug | ORAL_TABLET | Freq: Every day | ORAL | Status: DC

## 2014-06-17 NOTE — Assessment & Plan Note (Signed)
Could be a combination of Afib and thyroid disease. He'll f/u with cards re: afib.  See notes on labs re: TSH.  No other obvious sign/sx noted.  D/w pt.  He agrees.

## 2014-06-20 ENCOUNTER — Telehealth: Payer: Self-pay | Admitting: *Deleted

## 2014-06-20 NOTE — Telephone Encounter (Signed)
See TSH results and instructions from Dr. Damita Dunnings on 06/17/14. Results given to patient and follow-up lab appointment scheduled.

## 2014-06-28 ENCOUNTER — Other Ambulatory Visit: Payer: Self-pay

## 2014-06-28 MED ORDER — LEVOTHYROXINE SODIUM 75 MCG PO TABS: 75.0000 ug | ORAL_TABLET | Freq: Every day | ORAL | Status: DC

## 2014-07-02 ENCOUNTER — Other Ambulatory Visit: Payer: Self-pay | Admitting: Family Medicine

## 2014-07-02 MED ORDER — LEVOTHYROXINE SODIUM 75 MCG PO TABS: 75.0000 ug | ORAL_TABLET | Freq: Every day | ORAL | Status: DC

## 2014-07-13 ENCOUNTER — Other Ambulatory Visit: Payer: Medicare HMO

## 2014-07-13 ENCOUNTER — Other Ambulatory Visit (INDEPENDENT_AMBULATORY_CARE_PROVIDER_SITE_OTHER): Payer: Medicare HMO

## 2014-07-13 DIAGNOSIS — E039 Hypothyroidism, unspecified: Secondary | ICD-10-CM

## 2014-07-13 LAB — TSH: TSH: 7.54 u[IU]/mL — AB (ref 0.35–4.50)

## 2014-07-16 ENCOUNTER — Other Ambulatory Visit: Payer: Self-pay | Admitting: Family Medicine

## 2014-07-16 DIAGNOSIS — E039 Hypothyroidism, unspecified: Secondary | ICD-10-CM

## 2014-07-16 MED ORDER — LEVOTHYROXINE SODIUM 112 MCG PO TABS: 112.0000 ug | ORAL_TABLET | Freq: Every day | ORAL | Status: DC

## 2014-08-17 ENCOUNTER — Other Ambulatory Visit: Payer: Medicare HMO

## 2014-09-13 ENCOUNTER — Other Ambulatory Visit (INDEPENDENT_AMBULATORY_CARE_PROVIDER_SITE_OTHER): Payer: Medicare HMO

## 2014-09-13 DIAGNOSIS — E039 Hypothyroidism, unspecified: Secondary | ICD-10-CM

## 2014-09-13 LAB — TSH: TSH: 7.29 u[IU]/mL — ABNORMAL HIGH (ref 0.35–4.50)

## 2014-09-16 ENCOUNTER — Other Ambulatory Visit: Payer: Self-pay | Admitting: Family Medicine

## 2014-09-16 DIAGNOSIS — E039 Hypothyroidism, unspecified: Secondary | ICD-10-CM

## 2014-09-16 MED ORDER — LEVOTHYROXINE SODIUM 112 MCG PO TABS: 112.0000 ug | ORAL_TABLET | Freq: Every day | ORAL | Status: DC

## 2014-10-24 ENCOUNTER — Other Ambulatory Visit: Payer: Self-pay | Admitting: Family Medicine

## 2014-10-24 DIAGNOSIS — I10 Essential (primary) hypertension: Secondary | ICD-10-CM

## 2014-10-25 ENCOUNTER — Other Ambulatory Visit (INDEPENDENT_AMBULATORY_CARE_PROVIDER_SITE_OTHER): Payer: Commercial Managed Care - HMO

## 2014-10-25 DIAGNOSIS — I10 Essential (primary) hypertension: Secondary | ICD-10-CM

## 2014-10-25 LAB — CBC WITH DIFFERENTIAL/PLATELET
BASOS ABS: 0 10*3/uL (ref 0.0–0.1)
BASOS PCT: 0.5 % (ref 0.0–3.0)
Eosinophils Absolute: 0.1 10*3/uL (ref 0.0–0.7)
Eosinophils Relative: 2.6 % (ref 0.0–5.0)
HCT: 38.7 % — ABNORMAL LOW (ref 39.0–52.0)
HEMOGLOBIN: 13.2 g/dL (ref 13.0–17.0)
Lymphocytes Relative: 21.4 % (ref 12.0–46.0)
Lymphs Abs: 1.2 10*3/uL (ref 0.7–4.0)
MCHC: 34 g/dL (ref 30.0–36.0)
MCV: 87.8 fl (ref 78.0–100.0)
Monocytes Absolute: 0.5 10*3/uL (ref 0.1–1.0)
Monocytes Relative: 8.1 % (ref 3.0–12.0)
Neutro Abs: 3.8 10*3/uL (ref 1.4–7.7)
Neutrophils Relative %: 67.4 % (ref 43.0–77.0)
PLATELETS: 110 10*3/uL — AB (ref 150.0–400.0)
RBC: 4.42 Mil/uL (ref 4.22–5.81)
RDW: 16.3 % — AB (ref 11.5–15.5)
WBC: 5.7 10*3/uL (ref 4.0–10.5)

## 2014-10-25 LAB — LIPID PANEL
CHOLESTEROL: 120 mg/dL (ref 0–200)
HDL: 32.7 mg/dL — AB (ref >39.00)
LDL CALC: 67 mg/dL (ref 0–99)
NonHDL: 87.3
Total CHOL/HDL Ratio: 4
Triglycerides: 100 mg/dL (ref 0.0–149.0)
VLDL: 20 mg/dL (ref 0.0–40.0)

## 2014-10-25 LAB — COMPREHENSIVE METABOLIC PANEL
ALK PHOS: 123 U/L — AB (ref 39–117)
ALT: 17 U/L (ref 0–53)
AST: 18 U/L (ref 0–37)
Albumin: 4.3 g/dL (ref 3.5–5.2)
BUN: 17 mg/dL (ref 6–23)
CALCIUM: 9.6 mg/dL (ref 8.4–10.5)
CO2: 30 mEq/L (ref 19–32)
CREATININE: 1.22 mg/dL (ref 0.40–1.50)
Chloride: 102 mEq/L (ref 96–112)
GFR: 62.75 mL/min (ref >60.00)
Glucose, Bld: 108 mg/dL — ABNORMAL HIGH (ref 70–99)
Potassium: 3.6 mEq/L (ref 3.5–5.1)
Sodium: 143 mEq/L (ref 135–145)
Total Bilirubin: 4.5 mg/dL — ABNORMAL HIGH (ref 0.2–1.2)
Total Protein: 6.9 g/dL (ref 6.0–8.3)

## 2014-10-25 LAB — TSH: TSH: 6.83 u[IU]/mL — ABNORMAL HIGH (ref 0.35–4.50)

## 2014-10-29 LAB — HM DIABETES EYE EXAM

## 2014-11-01 ENCOUNTER — Ambulatory Visit (INDEPENDENT_AMBULATORY_CARE_PROVIDER_SITE_OTHER): Payer: Commercial Managed Care - HMO | Admitting: Family Medicine

## 2014-11-01 ENCOUNTER — Encounter: Payer: Self-pay | Admitting: Family Medicine

## 2014-11-01 VITALS — BP 110/60 | HR 69 | Temp 98.3°F | Ht 71.0 in | Wt 191.5 lb

## 2014-11-01 DIAGNOSIS — Z0000 Encounter for general adult medical examination without abnormal findings: Secondary | ICD-10-CM

## 2014-11-01 DIAGNOSIS — R739 Hyperglycemia, unspecified: Secondary | ICD-10-CM

## 2014-11-01 DIAGNOSIS — E039 Hypothyroidism, unspecified: Secondary | ICD-10-CM | POA: Diagnosis not present

## 2014-11-01 DIAGNOSIS — R7989 Other specified abnormal findings of blood chemistry: Secondary | ICD-10-CM

## 2014-11-01 DIAGNOSIS — R945 Abnormal results of liver function studies: Secondary | ICD-10-CM

## 2014-11-01 DIAGNOSIS — Z7189 Other specified counseling: Secondary | ICD-10-CM

## 2014-11-01 DIAGNOSIS — I10 Essential (primary) hypertension: Secondary | ICD-10-CM

## 2014-11-01 DIAGNOSIS — I482 Chronic atrial fibrillation, unspecified: Secondary | ICD-10-CM

## 2014-11-01 LAB — COMPREHENSIVE METABOLIC PANEL
ALT: 17 U/L (ref 0–53)
AST: 17 U/L (ref 0–37)
Albumin: 4.3 g/dL (ref 3.5–5.2)
Alkaline Phosphatase: 128 U/L — ABNORMAL HIGH (ref 39–117)
BUN: 24 mg/dL — ABNORMAL HIGH (ref 6–23)
CALCIUM: 9.7 mg/dL (ref 8.4–10.5)
CO2: 31 mEq/L (ref 19–32)
Chloride: 99 mEq/L (ref 96–112)
Creatinine, Ser: 1.15 mg/dL (ref 0.40–1.50)
GFR: 67.18 mL/min (ref >60.00)
Glucose, Bld: 109 mg/dL — ABNORMAL HIGH (ref 70–99)
POTASSIUM: 3.5 meq/L (ref 3.5–5.1)
Sodium: 138 mEq/L (ref 135–145)
TOTAL PROTEIN: 6.9 g/dL (ref 6.0–8.3)
Total Bilirubin: 4.7 mg/dL — ABNORMAL HIGH (ref 0.2–1.2)

## 2014-11-01 MED ORDER — DOXAZOSIN MESYLATE 4 MG PO TABS: 4.0000 mg | ORAL_TABLET | Freq: Every day | ORAL | Status: DC

## 2014-11-01 MED ORDER — PRAVASTATIN SODIUM 40 MG PO TABS: 40.0000 mg | ORAL_TABLET | Freq: Every day | ORAL | Status: DC

## 2014-11-01 MED ORDER — LEVOTHYROXINE SODIUM 112 MCG PO TABS: 112.0000 ug | ORAL_TABLET | Freq: Every day | ORAL | Status: DC

## 2014-11-01 NOTE — Patient Instructions (Signed)
I'll check with cardiology to see if you need a referral for the ablation.  Go to the lab on the way out.  We'll contact you with your lab report. Increase your thyroid replacement, 8 pills in 7 days.  Recheck TSH in about 2 months at a lab visit.  Take care.  Glad to see you.

## 2014-11-01 NOTE — Progress Notes (Signed)
Pre visit review using our clinic review tool, if applicable. No additional management support is needed unless otherwise documented below in the visit note.  I have personally reviewed the Medicare Annual Wellness questionnaire and have noted 1. The patient's medical and social history 2. Their use of alcohol, tobacco or illicit drugs 3. Their current medications and supplements 4. The patient's functional ability including ADL's, fall risks, home safety risks and hearing or visual             impairment. 5. Diet and physical activities 6. Evidence for depression or mood disorders  The patients weight, height, BMI have been recorded in the chart and visual acuity is per eye clinic.  I have made referrals, counseling and provided education to the patient based review of the above and I have provided the pt with a written personalized care plan for preventive services.  Provider list updated- see scanned forms.  Routine anticipatory guidance given to patient.  See health maintenance.  Flu 06/2014 Shingles 2014 PNA prev done Tetanus 2010 Colonoscopy 2013 Prostate cancer screening and PSA options (with potential risks and benefits of testing vs not testing) were discussed along with recent recs/guidelines.  He declined testing PSA at this point. Advance directive- wife designated if patient were incapacitated.  Cognitive function addressed- see scanned forms- and if abnormal then additional documentation follows.  Sugar elevation d/w pt.  D/w pt about diet and exercise.  Hypothyroid.  TSH some better, no neck mass, no dysphagia.  He has needed escalating dose of replacement.  D/w pt.  Complaint.  No neck pain.   TBIL and Alk phos elevation.  No abd pain, jaundice, NAV, stool changes.  No abd sx.  No known cause, he doesn't feel poorly.   Hypertension:    Using medication without problems or lightheadedness: yes Chest pain with exertion:no Edema:no Short of breath: likely from AF, with  exertion, at baseline.  Other issues: he had his AF ablation cancelled and he may need another referral.  D/w pt.  Followed by Dr. Ubaldo Glassing with cards.   H/o low PLT, at baseline, no bleeding. D/w pt.  Labs reviewed with patient.   PMH and SH reviewed  Meds, vitals, and allergies reviewed.   ROS: See HPI.  Otherwise negative.    GEN: nad, alert and oriented HEENT: mucous membranes moist NECK: supple w/o LA CV: IRR, not tachy PULM: ctab, no inc wob ABD: soft, +bs, not ttp EXT: no edema SKIN: no acute rash

## 2014-11-02 ENCOUNTER — Encounter: Payer: Self-pay | Admitting: Family Medicine

## 2014-11-02 DIAGNOSIS — R945 Abnormal results of liver function studies: Secondary | ICD-10-CM

## 2014-11-02 DIAGNOSIS — Z7189 Other specified counseling: Secondary | ICD-10-CM | POA: Insufficient documentation

## 2014-11-02 DIAGNOSIS — R7989 Other specified abnormal findings of blood chemistry: Secondary | ICD-10-CM | POA: Insufficient documentation

## 2014-11-02 NOTE — Assessment & Plan Note (Signed)
Inc replacement, recheck in about 2 months.  No tmg on exam.  He agrees.

## 2014-11-02 NOTE — Assessment & Plan Note (Signed)
D/w pt about diet and exercise, no new meds today.

## 2014-11-02 NOTE — Assessment & Plan Note (Signed)
I can check with Dr. Ubaldo Glassing about his referral for ablation, but we need to resolve the LFT issues first, see notes on repeat labs.  INR per cards.

## 2014-11-02 NOTE — Assessment & Plan Note (Signed)
Controlled, no change in meds.  D/w pt.  He agrees.  

## 2014-11-02 NOTE — Assessment & Plan Note (Signed)
Check abd u/s, since repeat TBIL still up.  Await imaging results and proceed from there.  Unclear source at this point.

## 2014-11-02 NOTE — Assessment & Plan Note (Signed)
Flu 06/2014 Shingles 2014 PNA prev done Tetanus 2010 Colonoscopy 2013 Prostate cancer screening and PSA options (with potential risks and benefits of testing vs not testing) were discussed along with recent recs/guidelines.  He declined testing PSA at this point. Advance directive- wife designated if patient were incapacitated.  Cognitive function addressed- see scanned forms- and if abnormal then additional documentation follows.  Sugar elevation d/w pt.  D/w pt about diet and exercise.  Hypothyroid.  TSH some better, no neck mass, no dysphagia.  He has needed escalating dose of replacement.  D/w pt.  Complaint.  No neck pain.

## 2014-11-05 ENCOUNTER — Encounter: Payer: Self-pay | Admitting: Family Medicine

## 2014-11-05 ENCOUNTER — Ambulatory Visit: Payer: Self-pay | Admitting: Family Medicine

## 2014-11-06 ENCOUNTER — Other Ambulatory Visit: Payer: Self-pay | Admitting: Family Medicine

## 2014-11-06 DIAGNOSIS — R7989 Other specified abnormal findings of blood chemistry: Secondary | ICD-10-CM

## 2014-11-06 DIAGNOSIS — R945 Abnormal results of liver function studies: Principal | ICD-10-CM

## 2014-11-08 ENCOUNTER — Other Ambulatory Visit (INDEPENDENT_AMBULATORY_CARE_PROVIDER_SITE_OTHER): Payer: Commercial Managed Care - HMO

## 2014-11-08 DIAGNOSIS — R7989 Other specified abnormal findings of blood chemistry: Secondary | ICD-10-CM

## 2014-11-08 DIAGNOSIS — R945 Abnormal results of liver function studies: Principal | ICD-10-CM

## 2014-11-08 LAB — HEPATIC FUNCTION PANEL
ALT: 20 U/L (ref 0–53)
AST: 18 U/L (ref 0–37)
Albumin: 4.2 g/dL (ref 3.5–5.2)
Alkaline Phosphatase: 126 U/L — ABNORMAL HIGH (ref 39–117)
BILIRUBIN DIRECT: 1 mg/dL — AB (ref 0.0–0.3)
Total Bilirubin: 4.4 mg/dL — ABNORMAL HIGH (ref 0.2–1.2)
Total Protein: 6.9 g/dL (ref 6.0–8.3)

## 2014-11-09 ENCOUNTER — Other Ambulatory Visit: Payer: Self-pay | Admitting: Family Medicine

## 2014-11-09 DIAGNOSIS — R945 Abnormal results of liver function studies: Principal | ICD-10-CM

## 2014-11-09 DIAGNOSIS — R7989 Other specified abnormal findings of blood chemistry: Secondary | ICD-10-CM

## 2014-11-09 LAB — HEPATITIS PANEL, ACUTE
HCV AB: NEGATIVE
Hep A IgM: NONREACTIVE
Hep B C IgM: NONREACTIVE
Hepatitis B Surface Ag: NEGATIVE

## 2014-11-20 ENCOUNTER — Encounter: Payer: Self-pay | Admitting: Family Medicine

## 2014-11-26 ENCOUNTER — Ambulatory Visit (INDEPENDENT_AMBULATORY_CARE_PROVIDER_SITE_OTHER): Payer: Commercial Managed Care - HMO | Admitting: Nurse Practitioner

## 2014-11-26 ENCOUNTER — Encounter: Payer: Self-pay | Admitting: Nurse Practitioner

## 2014-11-26 ENCOUNTER — Other Ambulatory Visit (INDEPENDENT_AMBULATORY_CARE_PROVIDER_SITE_OTHER): Payer: Commercial Managed Care - HMO

## 2014-11-26 VITALS — BP 108/68 | HR 60 | Ht 69.5 in | Wt 195.0 lb

## 2014-11-26 DIAGNOSIS — R945 Abnormal results of liver function studies: Principal | ICD-10-CM

## 2014-11-26 DIAGNOSIS — R160 Hepatomegaly, not elsewhere classified: Secondary | ICD-10-CM

## 2014-11-26 DIAGNOSIS — R7989 Other specified abnormal findings of blood chemistry: Secondary | ICD-10-CM

## 2014-11-26 LAB — PROTIME-INR
INR: 2 ratio — ABNORMAL HIGH (ref 0.8–1.0)
PROTHROMBIN TIME: 21.7 s — AB (ref 9.6–13.1)

## 2014-11-26 NOTE — Patient Instructions (Addendum)
Please go to the basement level to have your labs drawn.    You have been scheduled for a CT scan of the abdomen and pelvis at [facility] ([address]---this is in the same building as [facility]).   You are scheduled on Wed 11-28-2014 at 11:30 am . You should arrive at 11:15 AM  to your appointment time for registration. Please follow the written instructions below on the day of your exam:  WARNING: IF YOU ARE ALLERGIC TO IODINE/X-RAY DYE, PLEASE NOTIFY RADIOLOGY IMMEDIATELY AT [phone]! YOU WILL BE GIVEN A 13 HOUR PREMEDICATION PREP.  1) Do not eat or drink anything after 7:30 am  (4 hours prior to your test) 2) You have been given 2 bottles of oral contrast to drink. The solution may taste  better if refrigerated, but do NOT add ice or any other liquid to this solution. Shake  well before drinking.    Drink 1 bottle of contrast @ 9:30 am  (2 hours prior to your exam)  Drink 1 bottle of contrast @ 10:30 am  (1 hour prior to your exam)  You may take any medications as prescribed with a small amount of water except for the following: Metformin, Glucophage, Glucovance, Avandamet, Riomet, Fortamet, Actoplus Met, Janumet, Glumetza or Metaglip. The above medications must be held the day of the exam AND 48 hours after the exam.  The purpose of you drinking the oral contrast is to aid in the visualization of your intestinal tract. The contrast solution may cause some diarrhea. Before your exam is started, you will be given a small amount of fluid to drink. Depending on your individual set of symptoms, you may also receive an intravenous injection of x-ray contrast/dye. Plan on being at [facility] for 30 minutes or long, depending on the type of exam you are having performed.  If you have any questions regarding your exam or if you need to reschedule, you may call the CT department at [phone] between the hours of 8:00 am and 5:00 pm, Monday-Friday.  __We will  call you with the lab and CT scan results.______________________________________________________________________

## 2014-11-27 LAB — ANA: ANA: NEGATIVE

## 2014-11-28 ENCOUNTER — Encounter: Payer: Self-pay | Admitting: Nurse Practitioner

## 2014-11-28 ENCOUNTER — Ambulatory Visit (INDEPENDENT_AMBULATORY_CARE_PROVIDER_SITE_OTHER)
Admission: RE | Admit: 2014-11-28 | Discharge: 2014-11-28 | Disposition: A | Discharge status: Home / Self Care | Payer: Commercial Managed Care - HMO | Source: Ambulatory Visit | Attending: Nurse Practitioner | Admitting: Nurse Practitioner

## 2014-11-28 DIAGNOSIS — R7989 Other specified abnormal findings of blood chemistry: Secondary | ICD-10-CM

## 2014-11-28 DIAGNOSIS — R160 Hepatomegaly, not elsewhere classified: Secondary | ICD-10-CM | POA: Insufficient documentation

## 2014-11-28 DIAGNOSIS — R945 Abnormal results of liver function studies: Principal | ICD-10-CM

## 2014-11-28 MED ORDER — IOHEXOL 300 MG/ML  SOLN
100.0000 mL | Freq: Once | INTRAMUSCULAR | Status: AC | PRN
  Administered 2014-11-28: 100 mL via INTRAVENOUS

## 2014-11-28 NOTE — Progress Notes (Signed)
Reviewed and agree with management plan.  Jalaiyah Throgmorton T. Kentravious Lipford, MD FACG 

## 2014-11-28 NOTE — Progress Notes (Signed)
     History of Present Illness:   Patient is a 69 year old male known to Dr. Fuller Plan from previous colonoscopies. He is referred by PCP for evaluation of abnormal LFTs. Patient has no abdominal pain. He drank moderate to heavy amounts of ETOH for several years but much less ETOH over the last few years.   Current Medications, Allergies, Past Medical History, Past Surgical History, Family History and Social History were reviewed in [employer] record.  Physical Exam: General: Pleasant, well developed , white male in no acute distress Head: Normocephalic and atraumatic Eyes:  sclerae anicteric, conjunctiva pink  Ears: Normal auditory acuity Lungs: Clear throughout to auscultation Heart: Regular rate and rhythm Abdomen: Soft, non distended, non-tender. Left lobe of liver enlarged. Normal bowel sounds Musculoskeletal: Symmetrical with no gross deformities  Extremities: No edema  Neurological: Alert oriented x 4, grossly nonfocal Psychological:  Alert and cooperative. Normal mood and affect  Assessment and Recommendations:  69 year old male with abnormal LFTs. His alk phos is minimally elevated. Total bilirubin is 4.4 but mainly indirect. Though LFTs not overly impressive his ultrasound does suggest portal hypertension with hepatomegaly and splenomegaly. His INR is 2.0 but may not be reliable given recent anti-platelet medications. Viral hepatitis serologies negative.  Will obtain labs to evaluate for immune / genetic liver disease but ETOH and heart failure are definite risk factors   Additionally, will obtain CTscan to look for possible causes of portal HTN or even infiltrating disease of liver. Will call him with CTscan results and further recommendations. Encouraged him to discontinue all ETOH.

## 2014-11-30 LAB — MITOCHONDRIAL/SMOOTH MUSCLE AB PNL
Mitochondrial M2 Ab, IgG: 1.13 — ABNORMAL HIGH (ref <0.91)
Smooth Muscle Ab: 8 U (ref <20)

## 2014-12-04 ENCOUNTER — Telehealth: Payer: Self-pay | Admitting: Gastroenterology

## 2014-12-04 NOTE — Telephone Encounter (Signed)
Patient gave me verbal consent to speak with his wife.  All questions about CT results answered.  They are asked that he keep the appt for 01/02/15 with Dr. Fuller Plan

## 2015-01-02 ENCOUNTER — Encounter: Payer: Self-pay | Admitting: Gastroenterology

## 2015-01-02 ENCOUNTER — Ambulatory Visit (INDEPENDENT_AMBULATORY_CARE_PROVIDER_SITE_OTHER): Payer: Commercial Managed Care - HMO | Admitting: Gastroenterology

## 2015-01-02 VITALS — BP 118/68 | HR 68 | Ht 69.5 in | Wt 189.2 lb

## 2015-01-02 DIAGNOSIS — R7989 Other specified abnormal findings of blood chemistry: Secondary | ICD-10-CM

## 2015-01-02 DIAGNOSIS — R945 Abnormal results of liver function studies: Secondary | ICD-10-CM

## 2015-01-02 DIAGNOSIS — K7460 Unspecified cirrhosis of liver: Secondary | ICD-10-CM | POA: Diagnosis not present

## 2015-01-02 NOTE — Patient Instructions (Signed)
Please have labs drawn every 6 months with your Primary Care Physician: Hepatic function panel, PT/INR, CBC.  cc: Elsie Stain, MD

## 2015-01-02 NOTE — Progress Notes (Signed)
History of Present Illness: This is a 69 year old male accompanied by his wife. Recently diagnosed with cirrhosis based on imaging studies and blood work. I reviewed his abdominal ultrasound, CT scan and recent blood work. INR=2.0 on warfarin, AMA minimally elevated and other serologies were negative. Longstanding cardiomyopathy with CHF and EF of 30-35 % on echo in 2014. His appetite is good, his weight is stable.  Abd/pelvic CT IMPRESSION: 1. Fatty, enlarged and cirrhotic liver. 2. Small ascites. 3. Coronary artery calcification.  No Known Allergies Outpatient Prescriptions Prior to Visit  Medication Sig Dispense Refill  . carvedilol (COREG) 25 MG tablet Take 25 mg by mouth 2 (two) times daily with a meal.      . digoxin (LANOXIN) 0.125 MG tablet Take 0.125 mg by mouth daily.    [name] doxazosin (CARDURA) 4 MG tablet Take 1 tablet (4 mg total) by mouth at bedtime. 90 tablet 3  . fish oil-omega-3 fatty acids 1000 MG capsule Take 1 g by mouth daily.      . furosemide (LASIX) 20 MG tablet Take 20 mg by mouth daily as needed.    [name] levothyroxine (SYNTHROID, LEVOTHROID) 112 MCG tablet Take 1 tablet (112 mcg total) by mouth daily. Except for 2 tabs on Sundays.  8 tabs in 7 days.    . losartan-hydrochlorothiazide (HYZAAR) 100-25 MG per tablet Take 1 tablet by mouth daily.      . Multiple Vitamin (MULTIVITAMIN) tablet Take 1 tablet by mouth daily.      . pravastatin (PRAVACHOL) 40 MG tablet Take 1 tablet (40 mg total) by mouth daily. 90 tablet 3  . warfarin (COUMADIN) 5 MG tablet Take 5 mg by mouth daily.     No facility-administered medications prior to visit.   Past Medical History  Diagnosis Date  . Atrial fibrillation   . CHF (congestive heart failure)   . Hyperlipidemia   . Hypertension   . Hyperglycemia   . ED (erectile dysfunction)   . Skin cancer of face     20 12 basal cell  . Internal hemorrhoids   . SCCA (squamous cell carcinoma) of skin     Left ear.  MOHS surgery 2014 [facility]  . Cirrhosis    Past Surgical History  Procedure Laterality Date  . A-fib cardiomyopathy ([facility])  10/2006    EF 25 - 35%  . Doppler echocardiography  10/31/2006    EF 25-35% Mod. M.R., mild -mod TR  . Cardioversion  2015   History   Social History  . Marital Status: Married    Spouse Name: N/A  . Number of Children: 2  . Years of Education: N/A   Occupational History  . [employer], [employer] business   . 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 Smoking status: Never Smoker   . Smokeless tobacco: Never Used  . Alcohol Use: 7.2 oz/week    12 Glasses of wine per week     Comment: h/o abuse, quit 2008, rare use as of 2014  . Drug Use: No  . Sexual Activity: Not on file   Other Topics Concern  . None   Social History Narrative   2 sons initially, 1 died September 04, 2009 from [place].   From [place], [place]   Works some at [employer]   Enjoys working in the yard   Married 1967   Caffeine daily   Family History  Problem Relation Age of Onset  .  Hypertension Mother   . Cancer Mother     Bilat. breast dz, s/p mastectomy, Bilat  . Colon cancer Mother     possible  . Colon polyps Mother   . Heart disease Father     CHF, S/P CABG (67) Pacer Incr BP  . Prostate cancer Neg Hx   . Heart disease Brother     CAD- stent and ICD/pacer      Physical Exam: General: Well developed , well nourished, no acute distress Head: Normocephalic and atraumatic Eyes:  sclerae icteric, EOMI Ears: Normal auditory acuity Mouth: No deformity or lesions Lungs: Clear throughout to auscultation Heart: Regular rate and rhythm; no murmurs, rubs or bruits Abdomen: Soft, non tender and non distended. No masses, hepatosplenomegaly or hernias noted. Normal Bowel sounds Musculoskeletal: Symmetrical with no gross deformities  Pulses:  Normal pulses noted Extremities: No clubbing, cyanosis, edema or deformities noted Neurological:  Alert oriented x 4, grossly nonfocal Psychological:  Alert and cooperative. Normal mood and affect  Assessment and Recommendations:  1. Cirrhosis. Presumed secondary to EtOH, NASH or hepatic congestion from CHF.  Avoid all alcohol use. Only have a maximum of 2 g of acetaminophen daily. Active follow-up with his cardiologist for optimal management of CHF and afib. The minimally elevated AMA is not likely clinically significant however liver biopsy could be performed to further evaluate. We discussed the possibility of a liver biopsy but we will not pursue that at this time. We had a long discussion about cirrhosis, its prognosis and management. I recommended an endoscopy to assess for varices however he would like to postpone this for a few months. He should have LFTs, CBC and PT/INR performed every 6 months to assess liver disease. He will have PT/INRs performed routinely for warfarin management. He would like to have routine blood work performed through his primary care's office and see me at least once each year, every 6 months would be better. I spent 25 minutes of face-to-face time with the patient and his wife. Greater than 50% of the time was spent counseling and coordinating care.  2. Atrial fibrillation managed on long-term warfarin.  3. CHF, cardiomyopathy with EF 30-35%. Ongoing, active follow up with his Cardiologist at the [facility].

## 2015-01-03 ENCOUNTER — Other Ambulatory Visit (INDEPENDENT_AMBULATORY_CARE_PROVIDER_SITE_OTHER): Payer: Commercial Managed Care - HMO

## 2015-01-03 DIAGNOSIS — E039 Hypothyroidism, unspecified: Secondary | ICD-10-CM | POA: Diagnosis not present

## 2015-01-03 LAB — TSH: TSH: 3.77 u[IU]/mL (ref 0.35–4.50)

## 2015-01-13 ENCOUNTER — Telehealth: Payer: Self-pay | Admitting: Family Medicine

## 2015-01-13 NOTE — Telephone Encounter (Signed)
Please send a note or call to Dr. Ubaldo Glassing with cards.  Patient had his GI f/u recently.  What is Dr. Bethanne Ginger opinion about referral for ablation at this point?  Thanks.

## 2015-01-14 NOTE — Telephone Encounter (Signed)
Sent message via in-basket and also faxed copy of GI notes to Dr. Ubaldo Glassing office.

## 2015-01-14 NOTE — Telephone Encounter (Signed)
Thanks

## 2015-02-03 ENCOUNTER — Telehealth: Payer: Self-pay | Admitting: Family Medicine

## 2015-02-03 NOTE — Telephone Encounter (Signed)
Will await input from Dr. Ubaldo Glassing. See below.

## 2015-02-03 NOTE — Telephone Encounter (Signed)
-----   Message from Despina Hidden, [place] sent at 01/28/2015 10:07 AM EDT ----- Regarding: FW: OV note from GI and note from Dr. Damita Dunnings Please see note below from Dr. Ubaldo Glassing  ----- Message -----    From: Teodoro Spray, MD    Sent: 01/28/2015   9:59 AM      To: Despina Hidden, CMA Subject: RE: OV note from GI and note from Dr. Damita Dunnings   Discussing this as an outpatient ----- Message -----    From: Despina Hidden, CMA    Sent: 01/14/2015  12:09 PM      To: Teodoro Spray, MD, Tonia Ghent, MD Subject: OV note from GI and note from Dr. Damita Dunnings       Please see phone note from Dr. Damita Dunnings dated 01/13/15. I have also faxed a copy of the GI note to your office.  "Please send a note or call to Dr. Ubaldo Glassing with cards. Patient had his GI f/u recently. What is Dr. Bethanne Ginger opinion about referral for ablation at this point? Thanks. "

## 2015-03-26 ENCOUNTER — Other Ambulatory Visit: Payer: Self-pay | Admitting: *Deleted

## 2015-05-22 ENCOUNTER — Other Ambulatory Visit: Payer: Self-pay | Admitting: Family Medicine

## 2015-05-22 NOTE — Telephone Encounter (Signed)
Received refill request electronically from pharmacy Refill request does not match medication list which shows 2 tabs on Sunday Last office visit 11/01/14, last TSH 01/03/15 Is it okay to refill as it was requested from the pharmacy?

## 2015-05-23 NOTE — Telephone Encounter (Signed)
rx sent.  Needs 1 day except for 2 on Sundays.  Thanks.

## 2015-07-08 ENCOUNTER — Other Ambulatory Visit: Payer: Self-pay | Admitting: Family Medicine

## 2015-07-08 DIAGNOSIS — R945 Abnormal results of liver function studies: Principal | ICD-10-CM

## 2015-07-08 DIAGNOSIS — R7989 Other specified abnormal findings of blood chemistry: Secondary | ICD-10-CM

## 2015-07-08 NOTE — Progress Notes (Signed)
Patient notified by telephone and lab appointment scheduled.

## 2015-07-08 NOTE — Progress Notes (Signed)
Call pt.  Due for f/u labs.  Orders are in.  Thanks.

## 2015-07-10 ENCOUNTER — Other Ambulatory Visit (INDEPENDENT_AMBULATORY_CARE_PROVIDER_SITE_OTHER): Payer: Commercial Managed Care - HMO

## 2015-07-10 DIAGNOSIS — R7989 Other specified abnormal findings of blood chemistry: Secondary | ICD-10-CM

## 2015-07-10 DIAGNOSIS — R945 Abnormal results of liver function studies: Principal | ICD-10-CM

## 2015-07-10 LAB — CBC WITH DIFFERENTIAL/PLATELET
BASOS PCT: 0.7 % (ref 0.0–3.0)
Basophils Absolute: 0 10*3/uL (ref 0.0–0.1)
Eosinophils Absolute: 0.1 10*3/uL (ref 0.0–0.7)
Eosinophils Relative: 2.2 % (ref 0.0–5.0)
HEMATOCRIT: 36.7 % — AB (ref 39.0–52.0)
Hemoglobin: 12.2 g/dL — ABNORMAL LOW (ref 13.0–17.0)
Lymphocytes Relative: 19.5 % (ref 12.0–46.0)
Lymphs Abs: 1 10*3/uL (ref 0.7–4.0)
MCHC: 33.3 g/dL (ref 30.0–36.0)
MCV: 92 fl (ref 78.0–100.0)
Monocytes Absolute: 0.4 10*3/uL (ref 0.1–1.0)
Monocytes Relative: 7.8 % (ref 3.0–12.0)
NEUTROS ABS: 3.7 10*3/uL (ref 1.4–7.7)
Neutrophils Relative %: 69.8 % (ref 43.0–77.0)
Platelets: 90 10*3/uL — ABNORMAL LOW (ref 150.0–400.0)
RBC: 3.99 Mil/uL — AB (ref 4.22–5.81)
RDW: 16.2 % — ABNORMAL HIGH (ref 11.5–15.5)
WBC: 5.3 10*3/uL (ref 4.0–10.5)

## 2015-07-10 LAB — HEPATIC FUNCTION PANEL
ALT: 12 U/L (ref 0–53)
AST: 17 U/L (ref 0–37)
Albumin: 4.3 g/dL (ref 3.5–5.2)
Alkaline Phosphatase: 135 U/L — ABNORMAL HIGH (ref 39–117)
BILIRUBIN TOTAL: 4.6 mg/dL — AB (ref 0.2–1.2)
Bilirubin, Direct: 1.1 mg/dL — ABNORMAL HIGH (ref 0.0–0.3)
Total Protein: 7.2 g/dL (ref 6.0–8.3)

## 2015-07-10 LAB — PROTIME-INR
INR: 2.7 ratio — ABNORMAL HIGH (ref 0.8–1.0)
Prothrombin Time: 29 s — ABNORMAL HIGH (ref 9.6–13.1)

## 2015-07-11 ENCOUNTER — Encounter: Payer: Self-pay | Admitting: *Deleted

## 2015-09-11 ENCOUNTER — Encounter: Payer: Self-pay | Admitting: Gastroenterology

## 2015-10-03 ENCOUNTER — Ambulatory Visit (INDEPENDENT_AMBULATORY_CARE_PROVIDER_SITE_OTHER): Payer: PPO | Admitting: Family Medicine

## 2015-10-03 ENCOUNTER — Encounter: Payer: Self-pay | Admitting: Family Medicine

## 2015-10-03 VITALS — BP 106/56 | HR 101 | Temp 98.5°F | Wt 186.8 lb

## 2015-10-03 DIAGNOSIS — R05 Cough: Secondary | ICD-10-CM

## 2015-10-03 DIAGNOSIS — R059 Cough, unspecified: Secondary | ICD-10-CM | POA: Insufficient documentation

## 2015-10-03 DIAGNOSIS — R052 Subacute cough: Secondary | ICD-10-CM | POA: Insufficient documentation

## 2015-10-03 MED ORDER — WARFARIN SODIUM 5 MG PO TABS: 2.5000 mg | ORAL_TABLET | Freq: Every day | ORAL | Status: DC

## 2015-10-03 MED ORDER — DOXYCYCLINE HYCLATE 100 MG PO TABS: 100.0000 mg | ORAL_TABLET | Freq: Two times a day (BID) | ORAL | Status: DC

## 2015-10-03 MED ORDER — PREDNISONE 20 MG PO TABS: ORAL_TABLET | ORAL | Status: DC

## 2015-10-03 MED ORDER — BENZONATATE 200 MG PO CAPS: 200.0000 mg | ORAL_CAPSULE | Freq: Three times a day (TID) | ORAL | Status: DC | PRN

## 2015-10-03 NOTE — Assessment & Plan Note (Signed)
Likely bronchitis with presumed second sickening.   He'll call the cardiology clinic and notify them about the doxycycline use.  In the meantime, cut back to 2mg  a day of coumadin while on antibiotics.  Prednisone use: 1 tab a day for 4 days, then 1/2 tab a day for 4 days. Take with food. For wheezing.  If he has 3lb weight/fluid gain on the medicine, then stop the pred and update me.  Tessalon for cough, prn.  Take care. Rest and fluids for now.  Nontoxic.  Okay for outpatient fu.  He agrees.

## 2015-10-03 NOTE — Progress Notes (Signed)
Pre visit review using our clinic review tool, if applicable. No additional management support is needed unless otherwise documented below in the visit note.  Started with sx about 9 days ago. Scratchy throat initially.  Then had rhinorrhea, mild.  In the meantime he was feeling better.  Then got worse again by the weekend.  He rested all weekend.  Cough and wheeze at that point.  In the meantime, he was still able to work Tuesday and Wednesday.  No fevers.  Still with cough.  Taking tylenol BID prn.  Still with sputum, scant.  Coughing fits, "like I'm on the verge of getting it up."    Coumadin per Dr Ubaldo Glassing.    Meds, vitals, and allergies reviewed.   ROS: See HPI.  Otherwise, noncontributory.  GEN: nad, alert and oriented HEENT: mucous membranes moist, tm w/o erythema, nasal exam w/o erythema, clear discharge noted,  OP with cobblestoning NECK: supple w/o LA CV: IRR.   PULM: coarse but ctab o/w, no inc wob EXT: 1+ BLE edema SKIN: no acute rash

## 2015-10-03 NOTE — Patient Instructions (Signed)
Likely bronchitis.  Call the cardiology clinic and notify them about the doxycycline use.  In the meantime, cut back to 2mg  a day of coumadin while on antibiotics.  Prednisone use: 1 tab a day for 4 days, then 1/2 tab a day for 4 days.  Take with food.  See if that helps the wheezing.   If you have 3lb weight/fluid gain on the medicine, then stop the medicine and update me.   Tessalon for cough.  Take care.  Rest and fluids for now.  Glad to see you.

## 2015-10-08 DIAGNOSIS — L570 Actinic keratosis: Secondary | ICD-10-CM | POA: Diagnosis not present

## 2015-10-08 DIAGNOSIS — X32XXXA Exposure to sunlight, initial encounter: Secondary | ICD-10-CM | POA: Diagnosis not present

## 2015-10-15 DIAGNOSIS — I480 Paroxysmal atrial fibrillation: Secondary | ICD-10-CM | POA: Diagnosis not present

## 2015-10-30 DIAGNOSIS — I480 Paroxysmal atrial fibrillation: Secondary | ICD-10-CM | POA: Diagnosis not present

## 2015-11-05 DIAGNOSIS — X32XXXA Exposure to sunlight, initial encounter: Secondary | ICD-10-CM | POA: Diagnosis not present

## 2015-11-05 DIAGNOSIS — L570 Actinic keratosis: Secondary | ICD-10-CM | POA: Diagnosis not present

## 2015-11-13 DIAGNOSIS — I480 Paroxysmal atrial fibrillation: Secondary | ICD-10-CM | POA: Diagnosis not present

## 2015-11-14 ENCOUNTER — Other Ambulatory Visit: Payer: Self-pay | Admitting: Family Medicine

## 2015-11-14 ENCOUNTER — Other Ambulatory Visit (INDEPENDENT_AMBULATORY_CARE_PROVIDER_SITE_OTHER): Payer: PPO

## 2015-11-14 DIAGNOSIS — E7800 Pure hypercholesterolemia, unspecified: Secondary | ICD-10-CM

## 2015-11-14 DIAGNOSIS — I482 Chronic atrial fibrillation, unspecified: Secondary | ICD-10-CM

## 2015-11-14 LAB — COMPREHENSIVE METABOLIC PANEL
ALT: 13 U/L (ref 0–53)
AST: 17 U/L (ref 0–37)
Albumin: 4.3 g/dL (ref 3.5–5.2)
Alkaline Phosphatase: 124 U/L — ABNORMAL HIGH (ref 39–117)
BUN: 22 mg/dL (ref 6–23)
CHLORIDE: 102 meq/L (ref 96–112)
CO2: 32 mEq/L (ref 19–32)
Calcium: 9.7 mg/dL (ref 8.4–10.5)
Creatinine, Ser: 1.12 mg/dL (ref 0.40–1.50)
GFR: 69.05 mL/min (ref >60.00)
Glucose, Bld: 96 mg/dL (ref 70–99)
Potassium: 3.8 mEq/L (ref 3.5–5.1)
SODIUM: 141 meq/L (ref 135–145)
TOTAL PROTEIN: 7 g/dL (ref 6.0–8.3)
Total Bilirubin: 5 mg/dL — ABNORMAL HIGH (ref 0.2–1.2)

## 2015-11-14 LAB — LIPID PANEL
Cholesterol: 114 mg/dL (ref 0–200)
HDL: 38.2 mg/dL — ABNORMAL LOW (ref >39.00)
LDL CALC: 62 mg/dL (ref 0–99)
NonHDL: 75.72
Total CHOL/HDL Ratio: 3
Triglycerides: 67 mg/dL (ref 0.0–149.0)
VLDL: 13.4 mg/dL (ref 0.0–40.0)

## 2015-11-14 LAB — CBC WITH DIFFERENTIAL/PLATELET
Basophils Absolute: 0 10*3/uL (ref 0.0–0.1)
Basophils Relative: 0.7 % (ref 0.0–3.0)
EOS ABS: 0.1 10*3/uL (ref 0.0–0.7)
EOS PCT: 2.5 % (ref 0.0–5.0)
HCT: 34.5 % — ABNORMAL LOW (ref 39.0–52.0)
HEMOGLOBIN: 11.7 g/dL — AB (ref 13.0–17.0)
Lymphocytes Relative: 22.5 % (ref 12.0–46.0)
Lymphs Abs: 0.9 10*3/uL (ref 0.7–4.0)
MCHC: 33.9 g/dL (ref 30.0–36.0)
MCV: 89.3 fl (ref 78.0–100.0)
MONO ABS: 0.4 10*3/uL (ref 0.1–1.0)
Monocytes Relative: 10.1 % (ref 3.0–12.0)
Neutro Abs: 2.7 10*3/uL (ref 1.4–7.7)
Neutrophils Relative %: 64.2 % (ref 43.0–77.0)
Platelets: 92 10*3/uL — ABNORMAL LOW (ref 150.0–400.0)
RBC: 3.86 Mil/uL — ABNORMAL LOW (ref 4.22–5.81)
RDW: 15.9 % — ABNORMAL HIGH (ref 11.5–15.5)
WBC: 4.2 10*3/uL (ref 4.0–10.5)

## 2015-11-14 LAB — TSH: TSH: 0.08 u[IU]/mL — ABNORMAL LOW (ref 0.35–4.50)

## 2015-11-15 LAB — DIGOXIN LEVEL: DIGOXIN LVL: 0.7 ug/L — AB (ref 0.8–2.0)

## 2015-11-21 ENCOUNTER — Ambulatory Visit (INDEPENDENT_AMBULATORY_CARE_PROVIDER_SITE_OTHER): Payer: PPO | Admitting: Family Medicine

## 2015-11-21 ENCOUNTER — Encounter: Payer: Self-pay | Admitting: Family Medicine

## 2015-11-21 VITALS — BP 102/62 | HR 48 | Temp 97.7°F | Ht 70.0 in | Wt 186.0 lb

## 2015-11-21 DIAGNOSIS — E039 Hypothyroidism, unspecified: Secondary | ICD-10-CM | POA: Diagnosis not present

## 2015-11-21 DIAGNOSIS — Z23 Encounter for immunization: Secondary | ICD-10-CM | POA: Diagnosis not present

## 2015-11-21 DIAGNOSIS — I5022 Chronic systolic (congestive) heart failure: Secondary | ICD-10-CM

## 2015-11-21 DIAGNOSIS — I4891 Unspecified atrial fibrillation: Secondary | ICD-10-CM

## 2015-11-21 DIAGNOSIS — I10 Essential (primary) hypertension: Secondary | ICD-10-CM

## 2015-11-21 DIAGNOSIS — R7989 Other specified abnormal findings of blood chemistry: Secondary | ICD-10-CM

## 2015-11-21 DIAGNOSIS — E7800 Pure hypercholesterolemia, unspecified: Secondary | ICD-10-CM

## 2015-11-21 DIAGNOSIS — Z0000 Encounter for general adult medical examination without abnormal findings: Secondary | ICD-10-CM

## 2015-11-21 DIAGNOSIS — Z7189 Other specified counseling: Secondary | ICD-10-CM

## 2015-11-21 DIAGNOSIS — R945 Abnormal results of liver function studies: Secondary | ICD-10-CM

## 2015-11-21 MED ORDER — DOXAZOSIN MESYLATE 4 MG PO TABS: 4.0000 mg | ORAL_TABLET | Freq: Every day | ORAL | Status: DC

## 2015-11-21 MED ORDER — PRAVASTATIN SODIUM 40 MG PO TABS: 40.0000 mg | ORAL_TABLET | Freq: Every day | ORAL | Status: DC

## 2015-11-21 MED ORDER — LEVOTHYROXINE SODIUM 112 MCG PO TABS: ORAL_TABLET | ORAL | Status: DC

## 2015-11-21 MED ORDER — FUROSEMIDE 20 MG PO TABS: 40.0000 mg | ORAL_TABLET | Freq: Every day | ORAL | Status: DC | PRN

## 2015-11-21 NOTE — Patient Instructions (Signed)
We'll send your labs to Dr. Ubaldo Glassing.  Cut back on the thyroid medicine on Sundays.  Recheck labs in about 2 months.   Take care.  Glad to see you.

## 2015-11-21 NOTE — Assessment & Plan Note (Signed)
Controlled, continue as is.  Labs d/w pt.  

## 2015-11-21 NOTE — Progress Notes (Signed)
Pre visit review using our clinic review tool, if applicable. No additional management support is needed unless otherwise documented below in the visit note.  I have personally reviewed the Medicare Annual Wellness questionnaire and have noted 1. The patient's medical and social history 2. Their use of alcohol, tobacco or illicit drugs 3. Their current medications and supplements 4. The patient's functional ability including ADL's, fall risks, home safety risks and hearing or visual             impairment. 5. Diet and physical activities 6. Evidence for depression or mood disorders  The patients weight, height, BMI have been recorded in the chart and visual acuity is per eye clinic.  I have made referrals, counseling and provided education to the patient based review of the above and I have provided the pt with a written personalized care plan for preventive services.  Provider list updated- see scanned forms.  Routine anticipatory guidance given to patient.  See health maintenance.  Flu 2016 Shingles 2014 PNA 2017 (PNA 23 done at age 47 at pharmacy per patient report) Tetanus 2010 Colonoscopy 2013 Prostate cancer screening and PSA options (with potential risks and benefits of testing vs not testing) were discussed along with recent recs/guidelines.  He declined testing PSA at this point. Advance directive-wife or son equally designated if patient were incapacitated.   Cognitive function addressed- see scanned forms- and if abnormal then additional documentation follows.   Hypertension:    Using medication without problems or lightheadedness: yes Chest pain with exertion:no Edema: some worse prev, but stable now with lasix, better in the AM Short of breath: with hard exertion only, stable.    Elevated Cholesterol: Using medications without problems: yes Muscle aches: no Diet compliance:yes Exercise:yes Labs d/w pt.    D/w pt re: LFTs and his h/o low PLT.  No bleeding.  No abd  pain.    AF per cards.  Still on anticoagulation.  No bleeding.    Hypothyroidism.  No ADE on med.  Compliant.  TSH low.  D/w pt.  No neck mass.   PMH and SH reviewed  Meds, vitals, and allergies reviewed.   ROS: See HPI.  Otherwise negative.    GEN: nad, alert and oriented HEENT: mucous membranes moist NECK: supple w/o LA, no tmg CV: IRR PULM: ctab, no inc wob ABD: soft, +bs EXT: 1+ BLE edema SKIN: no acute rash

## 2015-11-21 NOTE — Assessment & Plan Note (Signed)
Bili stable, no jaundice, no abd sx.  plt stable, d/w pt.  Continue off Etoh.  D/w pt.  Doing well overall.

## 2015-11-21 NOTE — Assessment & Plan Note (Addendum)
Per cards.  Will route labs to cards as FYI. He has no known blood loss.  With minimal dec in HGB, would recheck later in 2017.  He agrees.

## 2015-11-21 NOTE — Assessment & Plan Note (Addendum)
Controlled, continue as is.  Labs d/w pt.  

## 2015-11-21 NOTE — Assessment & Plan Note (Signed)
Some BLE, but o/w appearing euvolemic, continue as is. D/w pt.  Last EF 35 per cards notes, reviewed with patient.

## 2015-11-21 NOTE — Assessment & Plan Note (Signed)
Overreplaced by TSH. Dec to 1.5 tabs on Sunday, 1 tab a day o/w.  Recheck in a few months.  He agrees.

## 2015-11-21 NOTE — Assessment & Plan Note (Signed)
Flu 2016 Shingles 2014 PNA 2017 (PNA 23 done at age 70 at pharmacy per patient report) Tetanus 2010 Colonoscopy 2013 Prostate cancer screening and PSA options (with potential risks and benefits of testing vs not testing) were discussed along with recent recs/guidelines.  He declined testing PSA at this point. Advance directive-wife or son equally designated if patient were incapacitated.   Cognitive function addressed- see scanned forms- and if abnormal then additional documentation follows.

## 2015-12-11 DIAGNOSIS — I480 Paroxysmal atrial fibrillation: Secondary | ICD-10-CM | POA: Diagnosis not present

## 2015-12-16 ENCOUNTER — Ambulatory Visit (INDEPENDENT_AMBULATORY_CARE_PROVIDER_SITE_OTHER): Payer: PPO | Admitting: Internal Medicine

## 2015-12-16 ENCOUNTER — Encounter: Payer: Self-pay | Admitting: Internal Medicine

## 2015-12-16 VITALS — BP 122/68 | HR 65 | Temp 97.4°F | Wt 185.0 lb

## 2015-12-16 DIAGNOSIS — J208 Acute bronchitis due to other specified organisms: Secondary | ICD-10-CM

## 2015-12-16 MED ORDER — HYDROCOD POLST-CPM POLST ER 10-8 MG/5ML PO SUER: 5.0000 mL | Freq: Every evening | ORAL | Status: DC | PRN

## 2015-12-16 MED ORDER — ALBUTEROL SULFATE HFA 108 (90 BASE) MCG/ACT IN AERS: 2.0000 | INHALATION_SPRAY | Freq: Four times a day (QID) | RESPIRATORY_TRACT | Status: DC | PRN

## 2015-12-16 MED ORDER — PREDNISONE 10 MG PO TABS: ORAL_TABLET | ORAL | Status: DC

## 2015-12-16 NOTE — Patient Instructions (Signed)

## 2015-12-16 NOTE — Progress Notes (Signed)
Pre visit review using our clinic review tool, if applicable. No additional management support is needed unless otherwise documented below in the visit note. 

## 2015-12-16 NOTE — Progress Notes (Signed)
HPI  Pt presents to the clinic today with c/o runny nose and cough. This started 2 days ago. He is blowing clear mucous out of his nose. The cough is nonproductive. He has noticed some wheezing. He denies fever, chills or body aches. He has taken Tessalon with minimal relief. He has a history of CHF but takes Carvedilol, Losartan HCT and Lasix as prescribed. He has had sick contacts. He is UTD on his flu and pneumonia vaccines.  Review of Systems      Past Medical History  Diagnosis Date  . Atrial fibrillation ([place])   . CHF (congestive heart failure) ([place])   . Hyperlipidemia   . Hypertension   . Hyperglycemia   . ED (erectile dysfunction)   . Skin cancer of face     2012 basal cell  . Internal hemorrhoids   . SCCA (squamous cell carcinoma) of skin     Left ear.  MOHS surgery 2014 [facility]  . Cirrhosis ([place])     Family History  Problem Relation Age of Onset  . Hypertension Mother   . Cancer Mother     Bilat. breast dz, s/p mastectomy, Bilat  . Colon cancer Mother     possible  . Colon polyps Mother   . Heart disease Father     CHF, S/P CABG (67) Pacer Incr BP  . Prostate cancer Neg Hx   . Heart disease Brother     CAD- stent and ICD/pacer    Social History   Social History  . Marital Status: Married    Spouse Name: N/A  . Number of Children: 2  . Years of Education: N/A   Occupational History  . [employer], [employer] business   . 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 Smoking status: Never Smoker   . Smokeless tobacco: Never Used  . Alcohol Use: Yes     Comment: h/o abuse, quit 2008, rare use as of 2014, none since 09/2014  . Drug Use: No  . Sexual Activity: Not on file   Other Topics Concern  . Not on file   Social History Narrative   2 sons initially, 1 died Aug 16, 2009 from [place].   From [place], [place]   Works some at [employer]   Enjoys working in the yard   Married 1967   Caffeine  daily    No Known Allergies   Constitutional:  Denies headache, fatigue, fever or abrupt weight changes.  HEENT:  Positive runny nose. Denies eye redness, eye pain, pressure behind the eyes, facial pain, nasal congestion, ear pain, ringing in the ears, wax buildup, or sore throat. Respiratory: Positive cough. Denies difficulty breathing or shortness of breath.  Cardiovascular: Denies chest pain, chest tightness, palpitations or swelling in the hands or feet.   No other specific complaints in a complete review of systems (except as listed in HPI above).  Objective:  BP 122/68 mmHg  Pulse 65  Temp(Src) 97.4 F (36.3 C) (Oral)  Wt 185 lb (83.915 kg)  SpO2 98%  Wt Readings from Last 3 Encounters:  12/16/15 185 lb (83.915 kg)  11/21/15 186 lb (84.369 kg)  10/03/15 186 lb 12 oz (84.709 kg)     General: Appears his stated age, well developed, well nourished in NAD. HEENT: Head: normal shape and size, no sinus tenderness noted; Eyes: sclera white, no icterus, conjunctiva pink; Ears: Tm's gray and intact, normal light reflex; Nose: mucosa pink and  moist, septum midline; Throat/Mouth: Teeth present, mucosa pink and moist, no exudate noted, no lesions or ulcerations noted.  Neck: Cervical lymphadenopathy, R>L.  Cardiovascular: Normal rate with irregular rhythm. S1,S2 noted.  No murmur, rubs or gallops noted.  Pulmonary/Chest: Normal effort with bilateral expiratory and inspiratory wheezing note. No respiratory distress. No rales or ronchi noted.      Assessment & Plan:   Viral Bronchitis:  Get some rest and drink plenty of water eRx for Prednisone x 6 days eRx for Tussionex cough syrup eRx for Albuterol inhaler prn Return precautions given- fever, SOB, coughing up colored mucous  RTC as needed or if symptoms persist or worsen.

## 2016-01-08 DIAGNOSIS — I480 Paroxysmal atrial fibrillation: Secondary | ICD-10-CM | POA: Diagnosis not present

## 2016-01-15 DIAGNOSIS — I426 Alcoholic cardiomyopathy: Secondary | ICD-10-CM | POA: Diagnosis not present

## 2016-01-15 DIAGNOSIS — I4891 Unspecified atrial fibrillation: Secondary | ICD-10-CM | POA: Diagnosis not present

## 2016-01-15 DIAGNOSIS — I10 Essential (primary) hypertension: Secondary | ICD-10-CM | POA: Diagnosis not present

## 2016-01-15 DIAGNOSIS — E785 Hyperlipidemia, unspecified: Secondary | ICD-10-CM | POA: Diagnosis not present

## 2016-01-16 ENCOUNTER — Telehealth: Payer: Self-pay | Admitting: Family Medicine

## 2016-01-16 NOTE — Telephone Encounter (Signed)
Left message on patient's voicemail to return call

## 2016-01-16 NOTE — Telephone Encounter (Signed)
Call pt.  He had seen GI last year.  GI still wanted to see him yearly.  Please ask him to call about follow up.  Thanks.

## 2016-01-16 NOTE — Telephone Encounter (Signed)
Wife advised. 

## 2016-01-20 ENCOUNTER — Other Ambulatory Visit (INDEPENDENT_AMBULATORY_CARE_PROVIDER_SITE_OTHER): Payer: PPO

## 2016-01-20 DIAGNOSIS — E039 Hypothyroidism, unspecified: Secondary | ICD-10-CM | POA: Diagnosis not present

## 2016-01-20 DIAGNOSIS — I4891 Unspecified atrial fibrillation: Secondary | ICD-10-CM

## 2016-01-20 LAB — CBC WITH DIFFERENTIAL/PLATELET
Basophils Absolute: 0 10*3/uL (ref 0.0–0.1)
Basophils Relative: 0.5 % (ref 0.0–3.0)
EOS PCT: 4.2 % (ref 0.0–5.0)
Eosinophils Absolute: 0.2 10*3/uL (ref 0.0–0.7)
HEMATOCRIT: 34.1 % — AB (ref 39.0–52.0)
Hemoglobin: 11.6 g/dL — ABNORMAL LOW (ref 13.0–17.0)
LYMPHS ABS: 1.1 10*3/uL (ref 0.7–4.0)
LYMPHS PCT: 24.2 % (ref 12.0–46.0)
MCHC: 34 g/dL (ref 30.0–36.0)
MCV: 87.7 fl (ref 78.0–100.0)
MONOS PCT: 9.1 % (ref 3.0–12.0)
Monocytes Absolute: 0.4 10*3/uL (ref 0.1–1.0)
NEUTROS ABS: 2.9 10*3/uL (ref 1.4–7.7)
NEUTROS PCT: 62 % (ref 43.0–77.0)
PLATELETS: 96 10*3/uL — AB (ref 150.0–400.0)
RBC: 3.89 Mil/uL — ABNORMAL LOW (ref 4.22–5.81)
RDW: 16.1 % — ABNORMAL HIGH (ref 11.5–15.5)
WBC: 4.7 10*3/uL (ref 4.0–10.5)

## 2016-01-20 LAB — TSH: TSH: 0.23 u[IU]/mL — ABNORMAL LOW (ref 0.35–4.50)

## 2016-01-21 DIAGNOSIS — I480 Paroxysmal atrial fibrillation: Secondary | ICD-10-CM | POA: Diagnosis not present

## 2016-02-19 DIAGNOSIS — I480 Paroxysmal atrial fibrillation: Secondary | ICD-10-CM | POA: Diagnosis not present

## 2016-03-11 DIAGNOSIS — I4891 Unspecified atrial fibrillation: Secondary | ICD-10-CM | POA: Diagnosis not present

## 2016-03-18 DIAGNOSIS — D044 Carcinoma in situ of skin of scalp and neck: Secondary | ICD-10-CM | POA: Diagnosis not present

## 2016-03-18 DIAGNOSIS — X32XXXA Exposure to sunlight, initial encounter: Secondary | ICD-10-CM | POA: Diagnosis not present

## 2016-03-18 DIAGNOSIS — Z85828 Personal history of other malignant neoplasm of skin: Secondary | ICD-10-CM | POA: Diagnosis not present

## 2016-03-18 DIAGNOSIS — D485 Neoplasm of uncertain behavior of skin: Secondary | ICD-10-CM | POA: Diagnosis not present

## 2016-03-18 DIAGNOSIS — L570 Actinic keratosis: Secondary | ICD-10-CM | POA: Diagnosis not present

## 2016-03-18 DIAGNOSIS — D0439 Carcinoma in situ of skin of other parts of face: Secondary | ICD-10-CM | POA: Diagnosis not present

## 2016-03-18 DIAGNOSIS — D045 Carcinoma in situ of skin of trunk: Secondary | ICD-10-CM | POA: Diagnosis not present

## 2016-03-18 DIAGNOSIS — C44329 Squamous cell carcinoma of skin of other parts of face: Secondary | ICD-10-CM | POA: Diagnosis not present

## 2016-04-08 DIAGNOSIS — I4891 Unspecified atrial fibrillation: Secondary | ICD-10-CM | POA: Diagnosis not present

## 2016-05-05 DIAGNOSIS — I4891 Unspecified atrial fibrillation: Secondary | ICD-10-CM | POA: Diagnosis not present

## 2016-05-12 DIAGNOSIS — C44329 Squamous cell carcinoma of skin of other parts of face: Secondary | ICD-10-CM | POA: Diagnosis not present

## 2016-05-12 DIAGNOSIS — L905 Scar conditions and fibrosis of skin: Secondary | ICD-10-CM | POA: Diagnosis not present

## 2016-05-19 DIAGNOSIS — D044 Carcinoma in situ of skin of scalp and neck: Secondary | ICD-10-CM | POA: Diagnosis not present

## 2016-05-19 DIAGNOSIS — D045 Carcinoma in situ of skin of trunk: Secondary | ICD-10-CM | POA: Diagnosis not present

## 2016-05-19 DIAGNOSIS — D0439 Carcinoma in situ of skin of other parts of face: Secondary | ICD-10-CM | POA: Diagnosis not present

## 2016-05-20 DIAGNOSIS — I4891 Unspecified atrial fibrillation: Secondary | ICD-10-CM | POA: Diagnosis not present

## 2016-06-17 DIAGNOSIS — I4891 Unspecified atrial fibrillation: Secondary | ICD-10-CM | POA: Diagnosis not present

## 2016-07-13 DIAGNOSIS — I10 Essential (primary) hypertension: Secondary | ICD-10-CM | POA: Diagnosis not present

## 2016-07-13 DIAGNOSIS — I4891 Unspecified atrial fibrillation: Secondary | ICD-10-CM | POA: Diagnosis not present

## 2016-07-13 DIAGNOSIS — E785 Hyperlipidemia, unspecified: Secondary | ICD-10-CM | POA: Diagnosis not present

## 2016-07-14 DIAGNOSIS — I4891 Unspecified atrial fibrillation: Secondary | ICD-10-CM | POA: Diagnosis not present

## 2016-08-11 ENCOUNTER — Encounter: Payer: Self-pay | Admitting: Gastroenterology

## 2016-08-11 DIAGNOSIS — I4891 Unspecified atrial fibrillation: Secondary | ICD-10-CM | POA: Diagnosis not present

## 2016-08-31 DIAGNOSIS — H353111 Nonexudative age-related macular degeneration, right eye, early dry stage: Secondary | ICD-10-CM | POA: Diagnosis not present

## 2016-09-08 DIAGNOSIS — I4891 Unspecified atrial fibrillation: Secondary | ICD-10-CM | POA: Diagnosis not present

## 2016-09-11 ENCOUNTER — Other Ambulatory Visit: Payer: Self-pay | Admitting: Pharmacist

## 2016-09-11 NOTE — Patient Outreach (Signed)
Outreach call to [name] [name] regarding his request for follow up from the [facility] Medication Adherence Campaign. Called and spoke with patient. Left a HIPAA compliant message on the patient's voicemail.   Harlow Asa, PharmD, [facility] [phone]

## 2016-09-17 ENCOUNTER — Telehealth: Payer: Self-pay

## 2016-09-17 DIAGNOSIS — E039 Hypothyroidism, unspecified: Secondary | ICD-10-CM

## 2016-09-17 NOTE — Telephone Encounter (Signed)
[facility] has switched generic manufacturers for levothyroxine and need MDs approval to fill with new manufacturer. Please advise

## 2016-09-21 NOTE — Telephone Encounter (Signed)
Okay to make the switch. Please give the order. Recheck TSH 2 months after the change. Order is in. Thanks.

## 2016-09-22 NOTE — Telephone Encounter (Signed)
[facility] notified, patient notified and lab appt scheduled.

## 2016-10-06 DIAGNOSIS — I4891 Unspecified atrial fibrillation: Secondary | ICD-10-CM | POA: Diagnosis not present

## 2016-10-07 DIAGNOSIS — Z85828 Personal history of other malignant neoplasm of skin: Secondary | ICD-10-CM | POA: Diagnosis not present

## 2016-10-07 DIAGNOSIS — X32XXXA Exposure to sunlight, initial encounter: Secondary | ICD-10-CM | POA: Diagnosis not present

## 2016-10-07 DIAGNOSIS — L570 Actinic keratosis: Secondary | ICD-10-CM | POA: Diagnosis not present

## 2016-10-07 DIAGNOSIS — D485 Neoplasm of uncertain behavior of skin: Secondary | ICD-10-CM | POA: Diagnosis not present

## 2016-10-07 DIAGNOSIS — D044 Carcinoma in situ of skin of scalp and neck: Secondary | ICD-10-CM | POA: Diagnosis not present

## 2016-10-07 DIAGNOSIS — C44229 Squamous cell carcinoma of skin of left ear and external auricular canal: Secondary | ICD-10-CM | POA: Diagnosis not present

## 2016-10-07 DIAGNOSIS — Z08 Encounter for follow-up examination after completed treatment for malignant neoplasm: Secondary | ICD-10-CM | POA: Diagnosis not present

## 2016-10-07 DIAGNOSIS — D045 Carcinoma in situ of skin of trunk: Secondary | ICD-10-CM | POA: Diagnosis not present

## 2016-10-07 DIAGNOSIS — C44529 Squamous cell carcinoma of skin of other part of trunk: Secondary | ICD-10-CM | POA: Diagnosis not present

## 2016-10-07 DIAGNOSIS — C44329 Squamous cell carcinoma of skin of other parts of face: Secondary | ICD-10-CM | POA: Diagnosis not present

## 2016-10-07 DIAGNOSIS — C44729 Squamous cell carcinoma of skin of left lower limb, including hip: Secondary | ICD-10-CM | POA: Diagnosis not present

## 2016-11-04 DIAGNOSIS — I4891 Unspecified atrial fibrillation: Secondary | ICD-10-CM | POA: Diagnosis not present

## 2016-11-19 ENCOUNTER — Other Ambulatory Visit (INDEPENDENT_AMBULATORY_CARE_PROVIDER_SITE_OTHER): Payer: PPO

## 2016-11-19 ENCOUNTER — Other Ambulatory Visit: Payer: Self-pay | Admitting: Family Medicine

## 2016-11-19 DIAGNOSIS — I482 Chronic atrial fibrillation, unspecified: Secondary | ICD-10-CM

## 2016-11-19 DIAGNOSIS — I10 Essential (primary) hypertension: Secondary | ICD-10-CM

## 2016-11-19 DIAGNOSIS — C44529 Squamous cell carcinoma of skin of other part of trunk: Secondary | ICD-10-CM | POA: Diagnosis not present

## 2016-11-19 LAB — CBC WITH DIFFERENTIAL/PLATELET
BASOS ABS: 0.1 10*3/uL (ref 0.0–0.1)
BASOS PCT: 1.1 % (ref 0.0–3.0)
EOS PCT: 3.2 % (ref 0.0–5.0)
Eosinophils Absolute: 0.2 10*3/uL (ref 0.0–0.7)
HEMATOCRIT: 37.3 % — AB (ref 39.0–52.0)
Hemoglobin: 12.6 g/dL — ABNORMAL LOW (ref 13.0–17.0)
Lymphocytes Relative: 26.5 % (ref 12.0–46.0)
Lymphs Abs: 1.3 10*3/uL (ref 0.7–4.0)
MCHC: 33.7 g/dL (ref 30.0–36.0)
MCV: 90 fl (ref 78.0–100.0)
MONOS PCT: 8.1 % (ref 3.0–12.0)
Monocytes Absolute: 0.4 10*3/uL (ref 0.1–1.0)
Neutro Abs: 3 10*3/uL (ref 1.4–7.7)
Neutrophils Relative %: 61.1 % (ref 43.0–77.0)
PLATELETS: 100 10*3/uL — AB (ref 150.0–400.0)
RBC: 4.15 Mil/uL — ABNORMAL LOW (ref 4.22–5.81)
RDW: 14.9 % (ref 11.5–15.5)
WBC: 5 10*3/uL (ref 4.0–10.5)

## 2016-11-19 LAB — LIPID PANEL
CHOL/HDL RATIO: 3
Cholesterol: 142 mg/dL (ref 0–200)
HDL: 43.7 mg/dL (ref >39.00)
LDL Cholesterol: 82 mg/dL (ref 0–99)
NONHDL: 97.85
Triglycerides: 81 mg/dL (ref 0.0–149.0)
VLDL: 16.2 mg/dL (ref 0.0–40.0)

## 2016-11-19 LAB — COMPREHENSIVE METABOLIC PANEL
ALT: 14 U/L (ref 0–53)
AST: 17 U/L (ref 0–37)
Albumin: 4.1 g/dL (ref 3.5–5.2)
Alkaline Phosphatase: 126 U/L — ABNORMAL HIGH (ref 39–117)
BILIRUBIN TOTAL: 3.3 mg/dL — AB (ref 0.2–1.2)
BUN: 18 mg/dL (ref 6–23)
CALCIUM: 9.1 mg/dL (ref 8.4–10.5)
CHLORIDE: 103 meq/L (ref 96–112)
CO2: 32 meq/L (ref 19–32)
Creatinine, Ser: 1.2 mg/dL (ref 0.40–1.50)
GFR: 63.57 mL/min (ref >60.00)
GLUCOSE: 99 mg/dL (ref 70–99)
Potassium: 3.6 mEq/L (ref 3.5–5.1)
Sodium: 140 mEq/L (ref 135–145)
Total Protein: 6.5 g/dL (ref 6.0–8.3)

## 2016-11-19 LAB — TSH: TSH: 10.55 u[IU]/mL — AB (ref 0.35–4.50)

## 2016-11-20 LAB — DIGOXIN LEVEL: Digoxin Level: 0.5 ug/L — ABNORMAL LOW (ref 0.8–2.0)

## 2016-11-26 ENCOUNTER — Encounter (INDEPENDENT_AMBULATORY_CARE_PROVIDER_SITE_OTHER): Payer: Self-pay

## 2016-11-26 ENCOUNTER — Encounter: Payer: Self-pay | Admitting: Family Medicine

## 2016-11-26 ENCOUNTER — Ambulatory Visit (INDEPENDENT_AMBULATORY_CARE_PROVIDER_SITE_OTHER): Payer: PPO | Admitting: Family Medicine

## 2016-11-26 VITALS — BP 124/78 | HR 67 | Temp 97.6°F | Ht 68.25 in | Wt 192.2 lb

## 2016-11-26 DIAGNOSIS — E039 Hypothyroidism, unspecified: Secondary | ICD-10-CM | POA: Diagnosis not present

## 2016-11-26 DIAGNOSIS — I5022 Chronic systolic (congestive) heart failure: Secondary | ICD-10-CM | POA: Diagnosis not present

## 2016-11-26 DIAGNOSIS — E7800 Pure hypercholesterolemia, unspecified: Secondary | ICD-10-CM | POA: Diagnosis not present

## 2016-11-26 DIAGNOSIS — I482 Chronic atrial fibrillation, unspecified: Secondary | ICD-10-CM

## 2016-11-26 DIAGNOSIS — Z0000 Encounter for general adult medical examination without abnormal findings: Secondary | ICD-10-CM

## 2016-11-26 MED ORDER — PRAVASTATIN SODIUM 40 MG PO TABS: 40.0000 mg | ORAL_TABLET | Freq: Every day | ORAL | 3 refills | Status: DC

## 2016-11-26 MED ORDER — DOXAZOSIN MESYLATE 4 MG PO TABS: 4.0000 mg | ORAL_TABLET | Freq: Every day | ORAL | 3 refills | Status: DC

## 2016-11-26 MED ORDER — LEVOTHYROXINE SODIUM 137 MCG PO TABS: 137.0000 ug | ORAL_TABLET | Freq: Every day | ORAL | 3 refills | Status: DC

## 2016-11-26 NOTE — Progress Notes (Signed)
I have personally reviewed the Medicare Annual Wellness questionnaire and have noted 1. The patient's medical and social history 2. Their use of alcohol, tobacco or illicit drugs 3. Their current medications and supplements 4. The patient's functional ability including ADL's, fall risks, home safety risks and hearing or visual             impairment. 5. Diet and physical activities 6. Evidence for depression or mood disorders  The patients weight, height, BMI have been recorded in the chart and visual acuity is per eye clinic.  I have made referrals, counseling and provided education to the patient based review of the above and I have provided the pt with a written personalized care plan for preventive services.  Provider list updated- see scanned forms.  Routine anticipatory guidance given to patient.  See health maintenance.  Flu 2017 Shingles 2014 PNA 2017 (PNA 23 done at age 56 at pharmacy per patient report) Tetanus 2010 Colonoscopy 2013, d/w pt.  I told patient I would check with GI about possible cologuard use.   Prostate cancer screening and PSA options (with potential risks and benefits of testing vs not testing) were discussed along with recent recs/guidelines.  He declined testing PSA at this point.   Advance directive-wife or son equally designated if patient were incapacitated.   Cognitive function addressed- see scanned forms- and if abnormal then additional documentation follows.   AF and CHF.  Still on coumadin.  No bleeding except for hemorrhoid irritation.  No CP.  SOB has slowly gotten worse in the last 10 years per patient report.  He has followed with cardiology.  He paces himself but still cuts wood, works, Social research officer, government.    Elevated Cholesterol: Using medications without problems:yes Muscle aches: no Diet compliance:yes Exercise:yes Some occ leg cramps at night.  Not with walking.  Noted on the back of the legs, occ medial thigh.  Mustard helps.    Hypothyroidism.  No  neck mass.  Manufacturer change with medicine.  No change in labelled dose.  TSH up.    PMH and SH reviewed  Meds, vitals, and allergies reviewed.   ROS: Per HPI.  Unless specifically indicated otherwise in HPI, the patient denies:  General: fever. Eyes: acute vision changes ENT: sore throat Cardiovascular: chest pain Respiratory: SOB GI: vomiting GU: dysuria Musculoskeletal: acute back pain Derm: acute rash Neuro: acute motor dysfunction Psych: worsening mood Endocrine: polydipsia Heme: bleeding Allergy: hayfever  GEN: nad, alert and oriented HEENT: mucous membranes moist NECK: supple w/o LA CV: IRR PULM: ctab, no inc wob ABD: soft, +bs EXT: 1+ BLE edema SKIN: no acute rash

## 2016-11-26 NOTE — Progress Notes (Signed)
Pre visit review using our clinic review tool, if applicable. No additional management support is needed unless otherwise documented below in the visit note. 

## 2016-11-26 NOTE — Patient Instructions (Addendum)
Increase thyroid replacement.  Recheck TSH in about 2 months at a nonfasting labs visit.  Take care.  Glad to see you.  Update me as needed.   I'll check with GI in the meantime.

## 2016-11-27 NOTE — Assessment & Plan Note (Signed)
C congestive heart failure discussion. Continue as is. Rate controlled.

## 2016-11-27 NOTE — Assessment & Plan Note (Addendum)
TSH elevated. He did have a drug manufacturer change. In any event we still need to increase his replacement. Increased 137 g per day. Recheck TSH in about 2 months. He agrees. No thyromegaly on exam.

## 2016-11-27 NOTE — Assessment & Plan Note (Signed)
He has had gradual worsening shortness of breath over the last 10 years but no acute changes. He is followed with cardiology. Continue current medications as is. At this point his functional status is still pretty good, he still is able to cut wood and work outside. He agrees with plan.

## 2016-11-27 NOTE — Assessment & Plan Note (Signed)
Lipids goal. Continue statin. Labs discussed with patient. He agrees.

## 2016-11-27 NOTE — Assessment & Plan Note (Signed)
Flu 2017 Shingles 2014 PNA 2017 (PNA 23 done at age 71 at pharmacy per patient report) Tetanus 2010 Colonoscopy 2013, d/w pt.  I told patient I would check with GI about possible cologuard use.   Prostate cancer screening and PSA options (with potential risks and benefits of testing vs not testing) were discussed along with recent recs/guidelines.  He declined testing PSA at this point.   Advance directive-wife or son equally designated if patient were incapacitated.   Cognitive function addressed- see scanned forms- and if abnormal then additional documentation follows.

## 2016-12-01 DIAGNOSIS — C44329 Squamous cell carcinoma of skin of other parts of face: Secondary | ICD-10-CM | POA: Diagnosis not present

## 2016-12-01 DIAGNOSIS — L905 Scar conditions and fibrosis of skin: Secondary | ICD-10-CM | POA: Diagnosis not present

## 2016-12-02 DIAGNOSIS — I4891 Unspecified atrial fibrillation: Secondary | ICD-10-CM | POA: Diagnosis not present

## 2016-12-07 ENCOUNTER — Telehealth: Payer: Self-pay | Admitting: Family Medicine

## 2016-12-07 NOTE — Telephone Encounter (Signed)
Call pt.  I checked with GI. cologuard is reasonable.  He should know that it has not been studied in higher risk patients like him with a family history of colon cancer. However, it is a reasonable offer because it would be better than not having any screening test done. He should know that if cologuard positive, it becomes a diagnostic rather than screening colonoscopy, in case that makes a difference with his insurance coverage.   Thanks.

## 2016-12-07 NOTE — Telephone Encounter (Signed)
Patient notified as instructed by telephone and verbalized understanding. Patient stated that he will contact his [employer] and find out if they cover the cologuard and will call back and let Dr. Damita Dunnings know.

## 2016-12-08 NOTE — Telephone Encounter (Signed)
Patient checked with his insurance company about Cologuard.  Patient said it is covered and 0 co-pay.

## 2016-12-09 ENCOUNTER — Other Ambulatory Visit: Payer: PPO

## 2016-12-09 NOTE — Telephone Encounter (Signed)
Signed order [facility]ed to [facility] as instructed.

## 2016-12-09 NOTE — Telephone Encounter (Signed)
Completed order form is on your desk for signature.

## 2016-12-09 NOTE — Telephone Encounter (Signed)
Thanks

## 2016-12-11 DIAGNOSIS — L905 Scar conditions and fibrosis of skin: Secondary | ICD-10-CM | POA: Diagnosis not present

## 2016-12-11 DIAGNOSIS — C44729 Squamous cell carcinoma of skin of left lower limb, including hip: Secondary | ICD-10-CM | POA: Diagnosis not present

## 2016-12-28 DIAGNOSIS — L905 Scar conditions and fibrosis of skin: Secondary | ICD-10-CM | POA: Diagnosis not present

## 2016-12-28 DIAGNOSIS — D044 Carcinoma in situ of skin of scalp and neck: Secondary | ICD-10-CM | POA: Diagnosis not present

## 2016-12-28 DIAGNOSIS — D045 Carcinoma in situ of skin of trunk: Secondary | ICD-10-CM | POA: Diagnosis not present

## 2016-12-30 DIAGNOSIS — I4891 Unspecified atrial fibrillation: Secondary | ICD-10-CM | POA: Diagnosis not present

## 2017-01-10 DIAGNOSIS — Z1211 Encounter for screening for malignant neoplasm of colon: Secondary | ICD-10-CM | POA: Diagnosis not present

## 2017-01-10 DIAGNOSIS — Z1212 Encounter for screening for malignant neoplasm of rectum: Secondary | ICD-10-CM | POA: Diagnosis not present

## 2017-01-11 DIAGNOSIS — I10 Essential (primary) hypertension: Secondary | ICD-10-CM | POA: Diagnosis not present

## 2017-01-11 DIAGNOSIS — I4891 Unspecified atrial fibrillation: Secondary | ICD-10-CM | POA: Diagnosis not present

## 2017-01-11 DIAGNOSIS — I426 Alcoholic cardiomyopathy: Secondary | ICD-10-CM | POA: Diagnosis not present

## 2017-01-11 DIAGNOSIS — E785 Hyperlipidemia, unspecified: Secondary | ICD-10-CM | POA: Diagnosis not present

## 2017-01-14 LAB — COLOGUARD: Cologuard: NEGATIVE

## 2017-01-19 ENCOUNTER — Encounter: Payer: Self-pay | Admitting: Family Medicine

## 2017-01-27 DIAGNOSIS — I4891 Unspecified atrial fibrillation: Secondary | ICD-10-CM | POA: Diagnosis not present

## 2017-01-29 ENCOUNTER — Other Ambulatory Visit (INDEPENDENT_AMBULATORY_CARE_PROVIDER_SITE_OTHER): Payer: PPO

## 2017-01-29 DIAGNOSIS — E039 Hypothyroidism, unspecified: Secondary | ICD-10-CM | POA: Diagnosis not present

## 2017-01-29 LAB — TSH: TSH: 2.76 u[IU]/mL (ref 0.35–4.50)

## 2017-02-02 ENCOUNTER — Telehealth: Payer: Self-pay

## 2017-02-02 NOTE — Telephone Encounter (Signed)
faxed notes to [facility]

## 2017-02-10 ENCOUNTER — Ambulatory Visit: Payer: PPO | Admitting: Internal Medicine

## 2017-02-11 ENCOUNTER — Ambulatory Visit (INDEPENDENT_AMBULATORY_CARE_PROVIDER_SITE_OTHER): Payer: PPO | Admitting: Family Medicine

## 2017-02-11 ENCOUNTER — Encounter: Payer: Self-pay | Admitting: Family Medicine

## 2017-02-11 ENCOUNTER — Ambulatory Visit (INDEPENDENT_AMBULATORY_CARE_PROVIDER_SITE_OTHER)
Admission: RE | Admit: 2017-02-11 | Discharge: 2017-02-11 | Disposition: A | Discharge status: Home / Self Care | Payer: PPO | Source: Ambulatory Visit | Attending: Family Medicine | Admitting: Family Medicine

## 2017-02-11 VITALS — BP 110/50 | HR 95 | Temp 98.9°F | Wt 176.8 lb

## 2017-02-11 DIAGNOSIS — R05 Cough: Secondary | ICD-10-CM

## 2017-02-11 DIAGNOSIS — R17 Unspecified jaundice: Secondary | ICD-10-CM

## 2017-02-11 DIAGNOSIS — R059 Cough, unspecified: Secondary | ICD-10-CM

## 2017-02-11 MED ORDER — ALBUTEROL SULFATE HFA 108 (90 BASE) MCG/ACT IN AERS: 1.0000 | INHALATION_SPRAY | Freq: Four times a day (QID) | RESPIRATORY_TRACT | 1 refills | Status: DC | PRN

## 2017-02-11 NOTE — Patient Instructions (Signed)
Use the inhaler as needed.   Go to the lab on the way out.  We'll contact you with your lab and xray report. Take care.  Glad to see you.  Update me tomorrow about how you feel.

## 2017-02-11 NOTE — Progress Notes (Signed)
Sx started 02/07/17 with ST, rhinorrhea.  Then had diarrhea.  02/08/17.  Dec solid PO intake in the meantime.  Weight loss noted. Still with some diarrhea.  Was able to eat a muffin this AM.  Still with some cough.  ST and rhinorrhea improved in the meantime.  Had leftover SABA and that has helped.  Still with some wheeze and chest tightness.  No fevers.  He doesn't awful but he doesn't feel great.  He feels some better today than yesterday, with SABA use.    Jaundice noted.  Prev liver imaging with  1. Fatty, enlarged and cirrhotic liver. 2. Small ascites.  History of mild elevation in bilirubin but this had been improved on most recent check in February 2018.   PMH and SH reviewed  ROS: Per HPI unless specifically indicated in ROS section   Meds, vitals, and allergies reviewed.   nad ncat Conjunctiva are yellowish.   Mmm OP wnl Neck supple, no LA Some wheeze and cough.  No focal dec in BS IRR, not tachy abd soft, not ttp, normal BS Skin is jaundiced.

## 2017-02-12 ENCOUNTER — Telehealth: Payer: Self-pay | Admitting: *Deleted

## 2017-02-12 ENCOUNTER — Other Ambulatory Visit: Payer: Self-pay

## 2017-02-12 ENCOUNTER — Encounter: Payer: Self-pay | Admitting: Family Medicine

## 2017-02-12 DIAGNOSIS — R17 Unspecified jaundice: Secondary | ICD-10-CM | POA: Insufficient documentation

## 2017-02-12 DIAGNOSIS — K7460 Unspecified cirrhosis of liver: Secondary | ICD-10-CM

## 2017-02-12 LAB — CBC WITH DIFFERENTIAL/PLATELET
Basophils Absolute: 0.1 10*3/uL (ref 0.0–0.1)
Basophils Relative: 1.2 % (ref 0.0–3.0)
Eosinophils Absolute: 0.4 10*3/uL (ref 0.0–0.7)
Eosinophils Relative: 4.2 % (ref 0.0–5.0)
HCT: 40 % (ref 39.0–52.0)
Hemoglobin: 13.6 g/dL (ref 13.0–17.0)
Lymphocytes Relative: 15 % (ref 12.0–46.0)
Lymphs Abs: 1.3 10*3/uL (ref 0.7–4.0)
MCHC: 34 g/dL (ref 30.0–36.0)
MCV: 90.2 fl (ref 78.0–100.0)
Monocytes Absolute: 0.9 10*3/uL (ref 0.1–1.0)
Monocytes Relative: 10.2 % (ref 3.0–12.0)
Neutro Abs: 6.1 10*3/uL (ref 1.4–7.7)
Neutrophils Relative %: 69.4 % (ref 43.0–77.0)
Platelets: 130 10*3/uL — ABNORMAL LOW (ref 150.0–400.0)
RBC: 4.44 Mil/uL (ref 4.22–5.81)
RDW: 14.4 % (ref 11.5–15.5)
WBC: 8.8 10*3/uL (ref 4.0–10.5)

## 2017-02-12 LAB — COMPREHENSIVE METABOLIC PANEL
ALBUMIN: 4.7 g/dL (ref 3.5–5.2)
ALK PHOS: 144 U/L — AB (ref 39–117)
ALT: 15 U/L (ref 0–53)
AST: 19 U/L (ref 0–37)
BUN: 18 mg/dL (ref 6–23)
CALCIUM: 9.7 mg/dL (ref 8.4–10.5)
CO2: 31 mEq/L (ref 19–32)
CREATININE: 1.16 mg/dL (ref 0.40–1.50)
Chloride: 99 mEq/L (ref 96–112)
GFR: 66.07 mL/min (ref >60.00)
Glucose, Bld: 91 mg/dL (ref 70–99)
POTASSIUM: 3.7 meq/L (ref 3.5–5.1)
Sodium: 138 mEq/L (ref 135–145)
TOTAL PROTEIN: 7.7 g/dL (ref 6.0–8.3)
Total Bilirubin: 6.7 mg/dL — ABNORMAL HIGH (ref 0.2–1.2)

## 2017-02-12 LAB — HEPATITIS PANEL, ACUTE
HCV Ab: NEGATIVE
Hep A IgM: NONREACTIVE
Hep B C IgM: NONREACTIVE
Hepatitis B Surface Ag: NEGATIVE

## 2017-02-12 MED ORDER — ALBUTEROL SULFATE HFA 108 (90 BASE) MCG/ACT IN AERS: 1.0000 | INHALATION_SPRAY | Freq: Four times a day (QID) | RESPIRATORY_TRACT | 1 refills | Status: DC | PRN

## 2017-02-12 NOTE — Telephone Encounter (Signed)
I would use the inhaler in the meantime.  I wouldn't start abx in the meantime.  Rest and fluids over the weekend.  Thanks for changing the inhaler to proair.

## 2017-02-12 NOTE — Telephone Encounter (Signed)
Spoke to pts wife, who states pt was seen on 5/17 and given an inhaler, which requires a PA. The pharmacy will be sending notification of the denial, but pts wife is wondering if he will need any additional medication such an abx to get thru the weekend. pls advise

## 2017-02-12 NOTE — Telephone Encounter (Signed)
1. What is the status on his PA for albuterol? 2. What is the status of the patient? Better or worse than yesterday? 3. Bilirubin increased, as expected, but other LFTs similar to prev. Other labs okay.   Assuming patient doesn't feel worse, would have patient see GI (referral done) to re-est care, recheck u/s (ordrered), and recheck LFTs early next week (ordered) with the plan for him to go to ER or call on-call MD this weekend if worsening.    Please let me know about the above. Thanks.

## 2017-02-12 NOTE — Assessment & Plan Note (Signed)
See notes on labs. Needs workup. This is clearly different from baseline, compared to previous exams. My hope is that he has a benign viral process causing the cough and jaundice, and that both will soon improved.  Routine cautions given to patient. At this point still okay for outpatient follow-up.

## 2017-02-12 NOTE — Assessment & Plan Note (Signed)
Check chest x-ray. Use albuterol as needed. This looks to be a viral process and should resolve. He feels some better today than he does yesterday. Would not start antibiotics at this point. Still okay for outpatient follow-up. Not in distress.

## 2017-02-12 NOTE — Telephone Encounter (Signed)
Wife says he is some better today, has eaten a sandwich for lunch which is better than he has done of late.  His cough, though is just as bad as ever, maybe worse.  Wife advised of lab results and the referral and [place] that is ordered.  Lab appt scheduled for Monday.

## 2017-02-12 NOTE — Telephone Encounter (Signed)
Change made and sent electronically

## 2017-02-12 NOTE — Telephone Encounter (Signed)
Wife advised. 

## 2017-02-12 NOTE — Telephone Encounter (Signed)
I changed his albuterol to Proair so his insurance would cover it.

## 2017-02-15 ENCOUNTER — Other Ambulatory Visit (INDEPENDENT_AMBULATORY_CARE_PROVIDER_SITE_OTHER): Payer: PPO

## 2017-02-15 DIAGNOSIS — K7460 Unspecified cirrhosis of liver: Secondary | ICD-10-CM

## 2017-02-15 DIAGNOSIS — I426 Alcoholic cardiomyopathy: Secondary | ICD-10-CM | POA: Diagnosis not present

## 2017-02-15 LAB — HEPATIC FUNCTION PANEL
ALK PHOS: 143 U/L — AB (ref 39–117)
ALT: 17 U/L (ref 0–53)
AST: 19 U/L (ref 0–37)
Albumin: 4.5 g/dL (ref 3.5–5.2)
BILIRUBIN DIRECT: 0.8 mg/dL — AB (ref 0.0–0.3)
TOTAL PROTEIN: 7.3 g/dL (ref 6.0–8.3)
Total Bilirubin: 2.7 mg/dL — ABNORMAL HIGH (ref 0.2–1.2)

## 2017-02-16 ENCOUNTER — Ambulatory Visit
Admission: RE | Admit: 2017-02-16 | Discharge: 2017-02-16 | Disposition: A | Discharge status: Home / Self Care | Payer: PPO | Source: Ambulatory Visit | Attending: Family Medicine | Admitting: Family Medicine

## 2017-02-16 DIAGNOSIS — K7460 Unspecified cirrhosis of liver: Secondary | ICD-10-CM | POA: Insufficient documentation

## 2017-02-17 ENCOUNTER — Telehealth: Payer: Self-pay | Admitting: Family Medicine

## 2017-02-17 NOTE — Telephone Encounter (Signed)
Patient called to get the results of his ultrasound.  Patient said if he's not home a detailed message can be left on the voice mail.

## 2017-02-17 NOTE — Telephone Encounter (Signed)
See result note.  Thanks!

## 2017-02-17 NOTE — Telephone Encounter (Signed)
See result note. Patient advised per Larene Beach.

## 2017-02-18 ENCOUNTER — Ambulatory Visit: Payer: PPO

## 2017-02-18 ENCOUNTER — Encounter: Payer: Self-pay | Admitting: Internal Medicine

## 2017-02-18 ENCOUNTER — Ambulatory Visit (INDEPENDENT_AMBULATORY_CARE_PROVIDER_SITE_OTHER): Payer: PPO | Admitting: Internal Medicine

## 2017-02-18 VITALS — BP 128/78 | HR 87 | Ht 69.5 in | Wt 177.8 lb

## 2017-02-18 DIAGNOSIS — I482 Chronic atrial fibrillation, unspecified: Secondary | ICD-10-CM

## 2017-02-18 DIAGNOSIS — I426 Alcoholic cardiomyopathy: Secondary | ICD-10-CM | POA: Diagnosis not present

## 2017-02-18 NOTE — Patient Instructions (Signed)
Medication Instructions: - Your physician recommends that you continue on your current medications as directed. Please refer to the Current Medication list given to you today.  Labwork: - none ordered  Procedures/Testing: - none ordered  Follow-Up: - Your physician recommends that you schedule a follow-up appointment in: 5-6 weeks with Dr. Caryl Comes.   Any Additional Special Instructions Will Be Listed Below (If Applicable).     If you need a refill on your cardiac medications before your next appointment, please call your pharmacy.

## 2017-02-18 NOTE — Progress Notes (Signed)
ELECTROPHYSIOLOGY CONSULT NOTE  Patient ID: [name] [name], MRN: [id number], DOB/AGE: 1946/08/28 71 y.o. Admit date: (Not on file) Date of Consult: 02/18/2017  Primary Physician: Tonia Ghent, MD Primary Cardiologist: Mickel Baas is being seen today for the evaluation of AFib at the request of  Dr Ubaldo Glassing   HPI [name] [name] is a 71 y.o. male  Referred because of left ventricular dysfunction and what is described as permanent atrial fibrillation.  He was last cardioverted with  amio support about 3-4 years ago, the amio stopped subsequently 2/2 hypothyroidism  He is not as strongly impressed That he was better in sinus rhythm than is suggested by the notes from Dr. Ubaldo Glassing.  He has exercise intolerance. He does not have nocturnal dyspnea or orthopnea. He has peripheral edema for which she takes diuretics.  He has irregular palpitations. He has not had syncope.  Holter monitor 2015 demonstrated mean heart rate of 76 with a range of 53--107; frequent PVCs were reported  Echocardiogram 5/18 demonstrated EF of 35% moderate MR, severe TR, with severe RV dysfunction a left atrial dimension of 60. Severe LVE is the major change from an echocardiogram 5/16  His cardiomyopathy has been ascribed to alcohol. He has not been drinking for 2 years.  No evaluation for coronary disease is evident  Labs are notable for normal TSH w treated hypothyroidism; CBC was normal liver function was normal alkaline phosphatase is elevated.     Past Medical History:  Diagnosis Date  . Atrial fibrillation ([place])   . CHF (congestive heart failure) ([place])   . Cirrhosis ([place])   . ED (erectile dysfunction)   . Hyperglycemia   . Hyperlipidemia   . Hypertension   . Internal hemorrhoids   . SCCA (squamous cell carcinoma) of skin    Left ear.  MOHS surgery 2014 [facility]  . Skin cancer of face    2012 basal cell      Surgical History:  Past Surgical History:  Procedure Laterality Date    . A-Fib Cardiomyopathy ([place])  10/2006   EF 25 - 35%  . DOPPLER ECHOCARDIOGRAPHY  10/31/2006   EF 25-35% Mod. M.R., mild -mod TR     Home Meds: Prior to Admission medications   Medication Sig Start Date End Date Taking? Authorizing Provider  albuterol (PROAIR HFA) 108 (90 Base) MCG/ACT inhaler Inhale 1-2 puffs into the lungs every 6 (six) hours as needed for wheezing or shortness of breath. 02/12/17   Tonia Ghent, MD  carvedilol (COREG) 25 MG tablet Take 25 mg by mouth 2 (two) times daily with a meal.      [provider]  digoxin (LANOXIN) 0.125 MG tablet Take 0.125 mg by mouth daily.    [provider]  doxazosin (CARDURA) 4 MG tablet Take 1 tablet (4 mg total) by mouth at bedtime. 11/26/16   Tonia Ghent, MD  fish oil-omega-3 fatty acids 1000 MG capsule Take 1 g by mouth daily.      [provider]  furosemide (LASIX) 20 MG tablet Take 2 tablets (40 mg total) by mouth daily as needed. 11/21/15   Tonia Ghent, MD  levothyroxine (SYNTHROID, LEVOTHROID) 137 MCG tablet Take 1 tablet (137 mcg total) by mouth daily before breakfast. 11/26/16   Tonia Ghent, MD  losartan-hydrochlorothiazide (HYZAAR) 100-25 MG per tablet Take 1 tablet by mouth daily.      [provider]  Melatonin 5 MG  CAPS Take 5 mg by mouth daily.  12/18/14   [provider]  Multiple Vitamin (MULTIVITAMIN) tablet Take 1 tablet by mouth daily.      [provider]  pravastatin (PRAVACHOL) 40 MG tablet Take 1 tablet (40 mg total) by mouth daily. 11/26/16   Tonia Ghent, MD  warfarin (COUMADIN) 6 MG tablet Take 6 mg by mouth. Take as directed by coumadin clinic    [provider]    Allergies: No Known Allergies  Social History   Social History  . Marital status: Married    Spouse name: N/A  . Number of children: 2  . Years of education: N/A   Occupational History  . [employer], [employer] business Retired  . 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 Semi-retired    Social History Main Topics  . Smoking status: Never Smoker  . Smokeless tobacco: Never Used  . Alcohol use Yes     Comment: h/o abuse, quit 2008, rare use as of 2014, none since 09/2014  . Drug use: No  . Sexual activity: Not on file   Other Topics Concern  . Not on file   Social History Narrative   Left handed   2 sons initially, 1 died 2009-09-19 from [place], his other son is local   From [place], [place]   Works some at [employer]   Enjoys working in the yard   Married 1967   Caffeine daily     Family History  Problem Relation Age of Onset  . Hypertension Mother   . Cancer Mother        Bilat. breast dz, s/p mastectomy, Bilat  . Colon cancer Mother        possible  . Colon polyps Mother   . Heart disease Father        CHF, S/P CABG (67) Pacer Incr BP  . Heart disease Brother        CAD- stent and ICD/pacer  . Prostate cancer Neg Hx      ROS:  Please see the history of present illness.     All other systems reviewed and negative.    Physical Exam:  Blood pressure 128/78, pulse 87, height 5' 9.5" (1.765 m), weight 177 lb 12 oz (80.6 kg). General: Well developed, well nourished male in no acute distress. Head: Normocephalic, atraumatic, sclera non-icteric, no xanthomas, nares are without discharge. EENT: normal  Lymph Nodes:  none Neck: Negative for carotid bruits. JVD8 cm  Back:without scoliosis kyphosis Lungs: Clear bilaterally to auscultation without wheezes, rales, or rhonchi. Breathing is unlabored. Heart: RRR with S1 S2. 2/6 murmur . Displaced and sustained PMI  Abdomen: Soft, non-tender, non-distended with normoactive bowel sounds. No hepatomegaly. No rebound/guarding. No obvious abdominal masses. Msk:  Strength and tone appear normal for age. Extremities: No clubbing or cyanosis. tr  edema.  Distal pedal pulses are 2+ and equal bilaterally. Skin: Warm and Dry Neuro: Alert and oriented X 3. CN III-XII intact Grossly  normal sensory and motor function . Psych:  Responds to questions appropriately with a normal affect.      Labs: Cardiac Enzymes No results for input(s): CKTOTAL, CKMB, TROPONINI in the last 72 hours. CBC Lab Results  Component Value Date   WBC 8.8 02/11/2017   HGB 13.6 02/11/2017   HCT 40.0 02/11/2017   MCV 90.2 02/11/2017   PLT 130.0 (L) 02/11/2017   PROTIME: No results for input(s): LABPROT, INR in the last 72 hours. Chemistry  Recent Labs Lab 02/11/17 1611 02/15/17 1139  NA 138  --   K 3.7  --   CL 99  --   CO2 31  --   BUN 18  --   CREATININE 1.16  --   CALCIUM 9.7  --   PROT 7.7 7.3  BILITOT 6.7* 2.7*  ALKPHOS 144* 143*  ALT 15 17  AST 19 19  GLUCOSE 91  --    Lipids Lab Results  Component Value Date   CHOL 142 11/19/2016   HDL 43.70 11/19/2016   LDLCALC 82 11/19/2016   TRIG 81.0 11/19/2016   BNP Pro B Natriuretic peptide (BNP)  Date/Time Value Ref Range Status  01/15/2009 11:15 AM 21.0 0.0 - 100.0 pg/mL Final   Thyroid Function Tests: No results for input(s): TSH, T4TOTAL, T3FREE, THYROIDAB in the last 72 hours.  Invalid input(s): FREET3 Miscellaneous No results found for: DDIMER  Radiology/Studies:  Dg Chest 2 View  Result Date: 02/11/2017 CLINICAL DATA:  Cough and jaundice EXAM: CHEST  2 VIEW COMPARISON:  October 31, 2006 FINDINGS: There is no edema or consolidation. There is generalized cardiomegaly with pulmonary venous hypertension. There is no evident adenopathy. There is aortic atherosclerosis. No bone lesions. IMPRESSION: Generalized cardiomegaly; underlying pericardial effusion cannot be excluded by radiography. There is pulmonary venous hypertension. These findings are felt to be indicative of pulmonary vascular congestion. No edema or consolidation. There is aortic atherosclerosis. Electronically Signed   By: Lowella Grip III M.D.   On: 02/11/2017 16:47   US Abdomen Limited Ruq  Result Date: 02/16/2017 CLINICAL DATA:   Cirrhosis, jaundice EXAM: US ABDOMEN LIMITED - RIGHT UPPER QUADRANT COMPARISON:  11/28/2014 FINDINGS: Gallbladder: Small amount of sludge within the gallbladder. Mild gallbladder wall thickening at 4 mm. Small amount of pericholecystic fluid. Small stones. Common bile duct: Diameter: Normal caliber, 3-4 mm Liver: Nodular contours to the liver compatible with given history of cirrhosis. No focal hepatic abnormality. IMPRESSION: Changes of cirrhosis.  No focal hepatic abnormality. Mild gallbladder wall thickening and small amount of pericholecystic fluid is likely related to liver disease. No stones. Electronically Signed   By: Rolm Baptise M.D.   On: 02/16/2017 09:52    EKG: Atrial fibrillation at 87 Intervals-/16/39 Axis left -79 IVCD-left bundle branchlike without notching the anterior precordium   Assessment and Plan:    Congestive heart failure-chronic-systolic-class IIB   cardiomyopathy question mechanism    atrial fibrillation-permanent    IVCD-left bundle branchlike   The patient has permanent atrial fibrillation with a very large left atrium. I do not think that there is a procedural option to address his atrial fibrillation based on left atrial size. His congestive heart failure however may be attenuated by reversible factors. His resting heart rate both on evaluation today when he saw Dr. Ubaldo Glassing last month was rapid. It would be worth undertaking a Holter monitor to look at heart rate excursion.  In the event that is relatively rapid augmented rate control would be valuable. AV ablation and pacing may further help. Regularity seems to help in addition to rate control.  In addition, if his heart failure symptoms persist, with his IVCD being more than 150 ms, it might be reasonable to pursue CRT  I would be inclined also to undertake catheterization as ischemic contribution to his cardiomyopathy offers a potential target for therapy  His cardiomyopathy is significantly affecting his  AV valvular function probably related to chamber enlargement. In the event that we could slow his heart rate  or improve his cardiomyopathy valvular regurgitation may diminish  In addition, with his cardiomyopathy, I would add Aldactone and then transition his losartan/HCTZ-Entresto.  I discussed this with Dr. Macario Golds

## 2017-02-19 ENCOUNTER — Ambulatory Visit: Payer: PPO | Admitting: Physician Assistant

## 2017-03-03 DIAGNOSIS — I480 Paroxysmal atrial fibrillation: Secondary | ICD-10-CM | POA: Diagnosis not present

## 2017-03-22 DIAGNOSIS — E785 Hyperlipidemia, unspecified: Secondary | ICD-10-CM | POA: Diagnosis not present

## 2017-03-22 DIAGNOSIS — I10 Essential (primary) hypertension: Secondary | ICD-10-CM | POA: Diagnosis not present

## 2017-03-22 DIAGNOSIS — I426 Alcoholic cardiomyopathy: Secondary | ICD-10-CM | POA: Diagnosis not present

## 2017-03-22 DIAGNOSIS — I4891 Unspecified atrial fibrillation: Secondary | ICD-10-CM | POA: Diagnosis not present

## 2017-04-01 ENCOUNTER — Ambulatory Visit: Payer: PPO | Admitting: Internal Medicine

## 2017-04-07 DIAGNOSIS — I480 Paroxysmal atrial fibrillation: Secondary | ICD-10-CM | POA: Diagnosis not present

## 2017-04-19 DIAGNOSIS — Z5181 Encounter for therapeutic drug level monitoring: Secondary | ICD-10-CM | POA: Diagnosis not present

## 2017-04-19 DIAGNOSIS — Z7901 Long term (current) use of anticoagulants: Secondary | ICD-10-CM | POA: Diagnosis not present

## 2017-05-04 DIAGNOSIS — I480 Paroxysmal atrial fibrillation: Secondary | ICD-10-CM | POA: Diagnosis not present

## 2017-05-19 DIAGNOSIS — Z5181 Encounter for therapeutic drug level monitoring: Secondary | ICD-10-CM | POA: Diagnosis not present

## 2017-05-19 DIAGNOSIS — Z7901 Long term (current) use of anticoagulants: Secondary | ICD-10-CM | POA: Diagnosis not present

## 2017-06-07 DIAGNOSIS — D0471 Carcinoma in situ of skin of right lower limb, including hip: Secondary | ICD-10-CM | POA: Diagnosis not present

## 2017-06-07 DIAGNOSIS — D045 Carcinoma in situ of skin of trunk: Secondary | ICD-10-CM | POA: Diagnosis not present

## 2017-06-07 DIAGNOSIS — Z85828 Personal history of other malignant neoplasm of skin: Secondary | ICD-10-CM | POA: Diagnosis not present

## 2017-06-07 DIAGNOSIS — X32XXXA Exposure to sunlight, initial encounter: Secondary | ICD-10-CM | POA: Diagnosis not present

## 2017-06-07 DIAGNOSIS — L570 Actinic keratosis: Secondary | ICD-10-CM | POA: Diagnosis not present

## 2017-06-07 DIAGNOSIS — D485 Neoplasm of uncertain behavior of skin: Secondary | ICD-10-CM | POA: Diagnosis not present

## 2017-06-07 DIAGNOSIS — C44329 Squamous cell carcinoma of skin of other parts of face: Secondary | ICD-10-CM | POA: Diagnosis not present

## 2017-06-07 DIAGNOSIS — C44629 Squamous cell carcinoma of skin of left upper limb, including shoulder: Secondary | ICD-10-CM | POA: Diagnosis not present

## 2017-06-07 DIAGNOSIS — Z08 Encounter for follow-up examination after completed treatment for malignant neoplasm: Secondary | ICD-10-CM | POA: Diagnosis not present

## 2017-06-07 DIAGNOSIS — I480 Paroxysmal atrial fibrillation: Secondary | ICD-10-CM | POA: Diagnosis not present

## 2017-06-10 IMAGING — DX DG CHEST 2V
2 series · 2 of 2 positions shown · non-contrast
Comparison: October 31, 2006

CLINICAL DATA: Cough and jaundice

EXAM:
CHEST  2 VIEW

[chest pa]
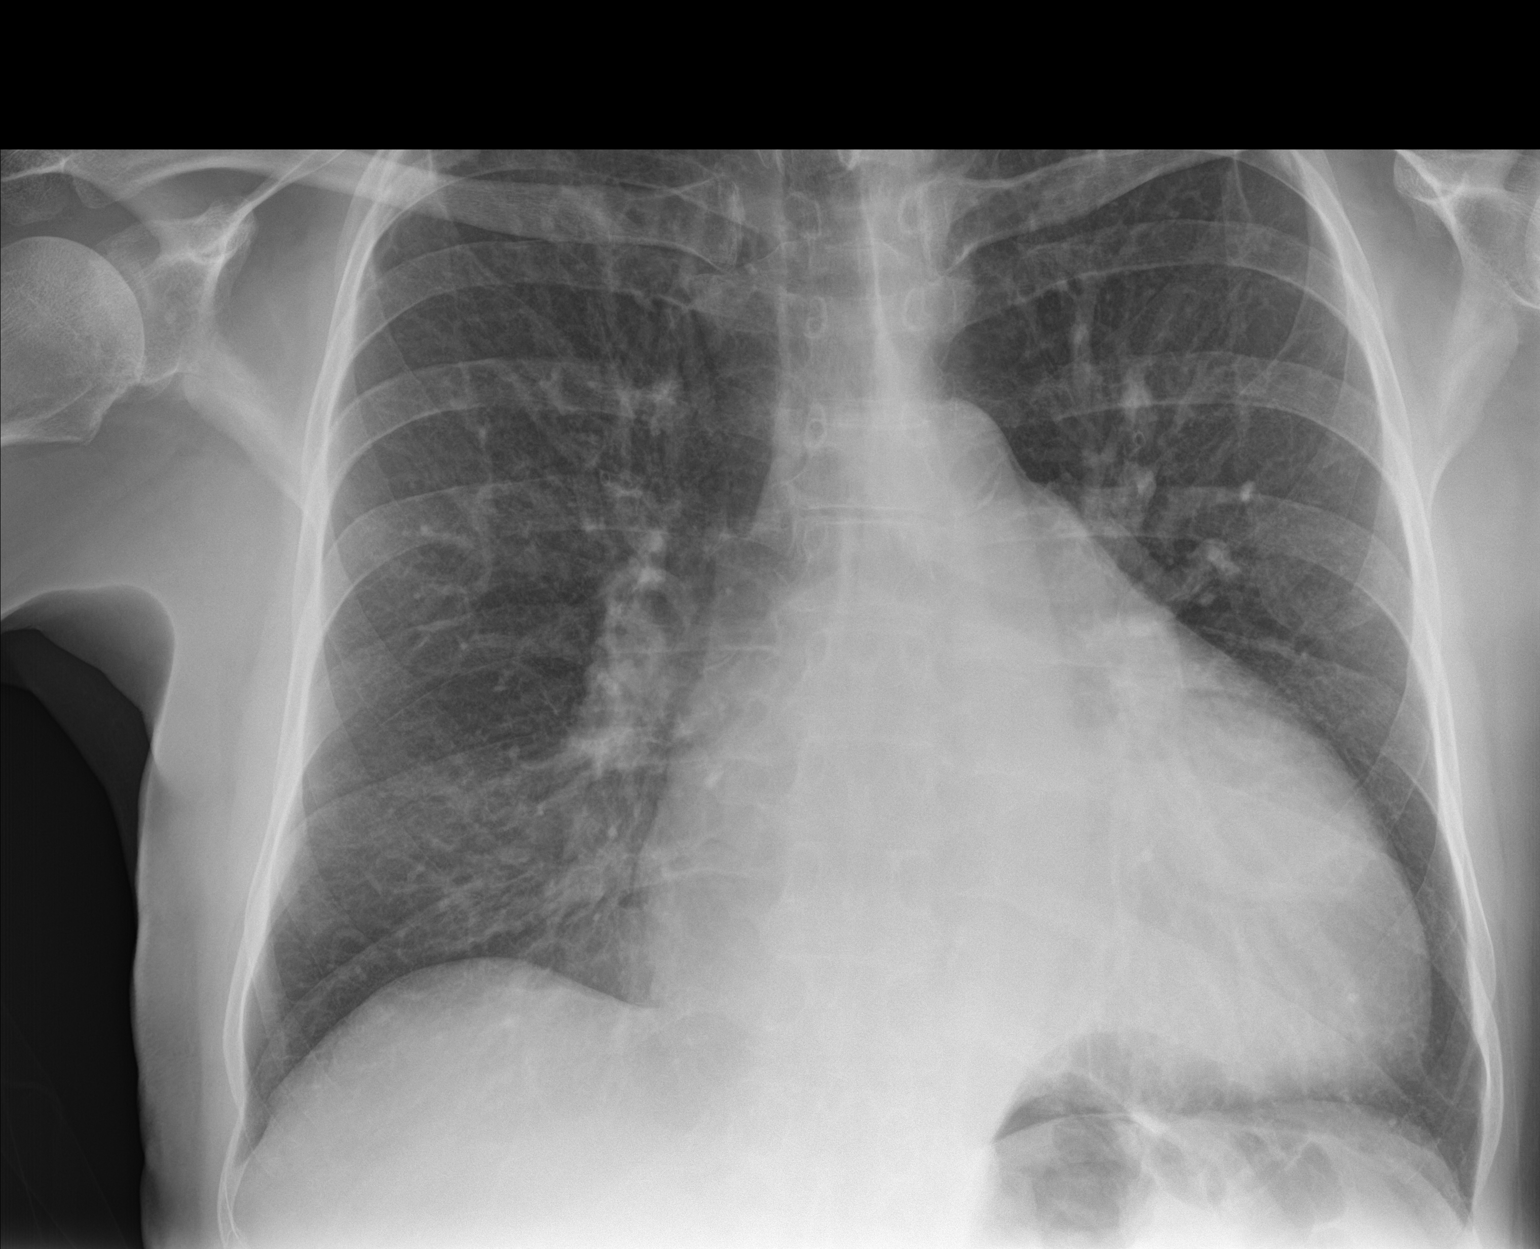

[chest lat]
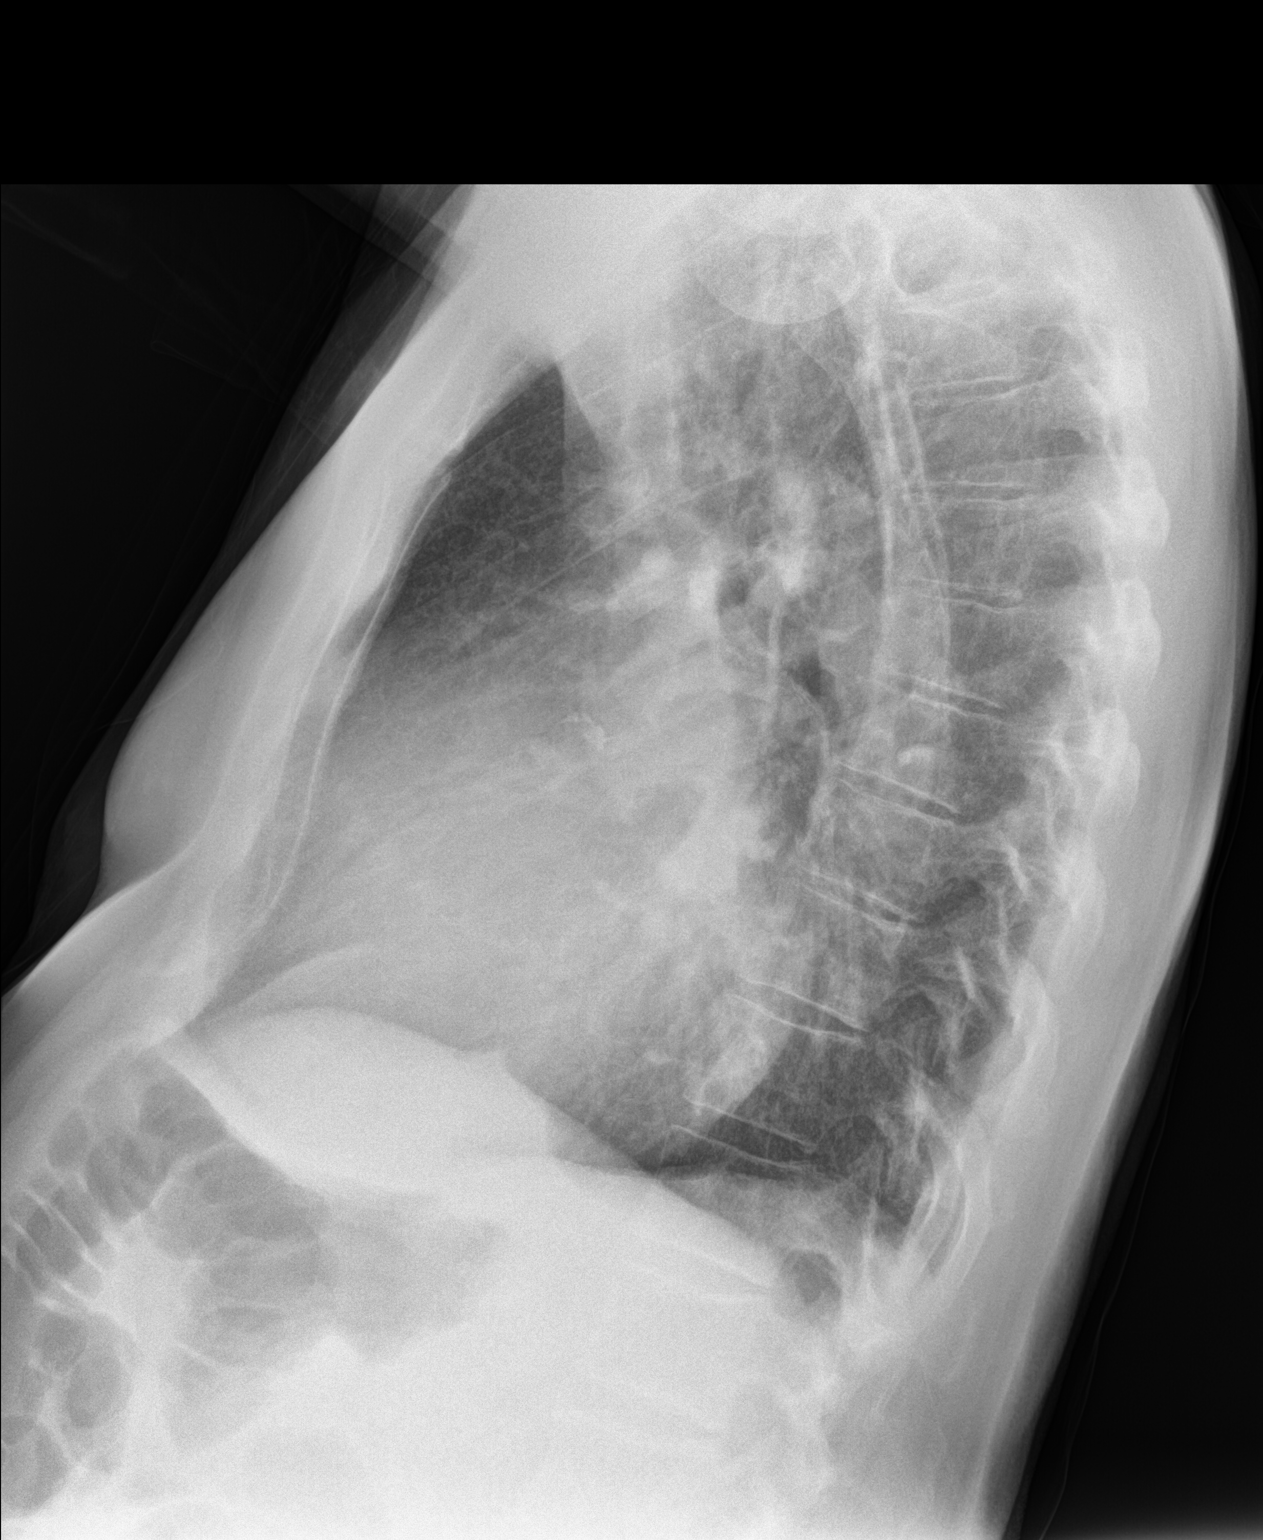

[2 of 2 positions shown; findings below may reference images not displayed]

FINDINGS: There is no edema or consolidation. There is generalized
cardiomegaly with pulmonary venous hypertension. There is no evident
adenopathy. There is aortic atherosclerosis. No bone lesions.
IMPRESSION: Generalized cardiomegaly; underlying pericardial effusion cannot be
excluded by radiography. There is pulmonary venous hypertension.
These findings are felt to be indicative of pulmonary vascular
congestion. No edema or consolidation. There is aortic
atherosclerosis.

## 2017-06-23 DIAGNOSIS — Z5181 Encounter for therapeutic drug level monitoring: Secondary | ICD-10-CM | POA: Diagnosis not present

## 2017-06-23 DIAGNOSIS — Z7901 Long term (current) use of anticoagulants: Secondary | ICD-10-CM | POA: Diagnosis not present

## 2017-07-05 DIAGNOSIS — I426 Alcoholic cardiomyopathy: Secondary | ICD-10-CM | POA: Diagnosis not present

## 2017-07-05 DIAGNOSIS — I10 Essential (primary) hypertension: Secondary | ICD-10-CM | POA: Diagnosis not present

## 2017-07-05 DIAGNOSIS — E785 Hyperlipidemia, unspecified: Secondary | ICD-10-CM | POA: Diagnosis not present

## 2017-07-05 DIAGNOSIS — I4891 Unspecified atrial fibrillation: Secondary | ICD-10-CM | POA: Diagnosis not present

## 2017-07-21 DIAGNOSIS — I480 Paroxysmal atrial fibrillation: Secondary | ICD-10-CM | POA: Diagnosis not present

## 2017-07-23 DIAGNOSIS — L905 Scar conditions and fibrosis of skin: Secondary | ICD-10-CM | POA: Diagnosis not present

## 2017-07-23 DIAGNOSIS — D0439 Carcinoma in situ of skin of other parts of face: Secondary | ICD-10-CM | POA: Diagnosis not present

## 2017-07-23 DIAGNOSIS — C44329 Squamous cell carcinoma of skin of other parts of face: Secondary | ICD-10-CM | POA: Diagnosis not present

## 2017-07-30 DIAGNOSIS — L905 Scar conditions and fibrosis of skin: Secondary | ICD-10-CM | POA: Diagnosis not present

## 2017-07-30 DIAGNOSIS — C44629 Squamous cell carcinoma of skin of left upper limb, including shoulder: Secondary | ICD-10-CM | POA: Diagnosis not present

## 2017-08-13 DIAGNOSIS — D045 Carcinoma in situ of skin of trunk: Secondary | ICD-10-CM | POA: Diagnosis not present

## 2017-08-13 DIAGNOSIS — D0471 Carcinoma in situ of skin of right lower limb, including hip: Secondary | ICD-10-CM | POA: Diagnosis not present

## 2017-08-25 DIAGNOSIS — I480 Paroxysmal atrial fibrillation: Secondary | ICD-10-CM | POA: Diagnosis not present

## 2017-09-22 DIAGNOSIS — I480 Paroxysmal atrial fibrillation: Secondary | ICD-10-CM | POA: Diagnosis not present

## 2017-10-20 DIAGNOSIS — I480 Paroxysmal atrial fibrillation: Secondary | ICD-10-CM | POA: Diagnosis not present

## 2017-10-31 ENCOUNTER — Other Ambulatory Visit: Payer: Self-pay | Admitting: Family Medicine

## 2017-10-31 DIAGNOSIS — Z79899 Other long term (current) drug therapy: Secondary | ICD-10-CM

## 2017-10-31 DIAGNOSIS — I482 Chronic atrial fibrillation, unspecified: Secondary | ICD-10-CM

## 2017-11-12 ENCOUNTER — Other Ambulatory Visit: Payer: Self-pay | Admitting: Family Medicine

## 2017-11-12 DIAGNOSIS — L821 Other seborrheic keratosis: Secondary | ICD-10-CM | POA: Diagnosis not present

## 2017-11-12 DIAGNOSIS — D0461 Carcinoma in situ of skin of right upper limb, including shoulder: Secondary | ICD-10-CM | POA: Diagnosis not present

## 2017-11-12 DIAGNOSIS — C44629 Squamous cell carcinoma of skin of left upper limb, including shoulder: Secondary | ICD-10-CM | POA: Diagnosis not present

## 2017-11-12 DIAGNOSIS — Z08 Encounter for follow-up examination after completed treatment for malignant neoplasm: Secondary | ICD-10-CM | POA: Diagnosis not present

## 2017-11-12 DIAGNOSIS — C44622 Squamous cell carcinoma of skin of right upper limb, including shoulder: Secondary | ICD-10-CM | POA: Diagnosis not present

## 2017-11-12 DIAGNOSIS — X32XXXA Exposure to sunlight, initial encounter: Secondary | ICD-10-CM | POA: Diagnosis not present

## 2017-11-12 DIAGNOSIS — Z85828 Personal history of other malignant neoplasm of skin: Secondary | ICD-10-CM | POA: Diagnosis not present

## 2017-11-12 DIAGNOSIS — L570 Actinic keratosis: Secondary | ICD-10-CM | POA: Diagnosis not present

## 2017-11-12 DIAGNOSIS — D485 Neoplasm of uncertain behavior of skin: Secondary | ICD-10-CM | POA: Diagnosis not present

## 2017-11-12 MED ORDER — LEVOTHYROXINE SODIUM 137 MCG PO TABS: 137.0000 ug | ORAL_TABLET | Freq: Every day | ORAL | 0 refills | Status: DC

## 2017-11-12 NOTE — Telephone Encounter (Signed)
Copied from [place] [id number]. Topic: General - Other >> Nov 12, 2017 10:03 AM Darl Householder, RMA wrote: Reason for CRM: Medication refill request for  levothyroxine (SYNTHROID, LEVOTHROID) 137 MCG tablet to be sent to [facility]

## 2017-11-12 NOTE — Telephone Encounter (Signed)
Rx sent to pharmacy   

## 2017-11-12 NOTE — Telephone Encounter (Signed)
synthroid  Refill   Will expire on 11/26/17 Last OV:   11/26/16   Med was discussed at this visit. Last Refill:  11/26/16 Pharmacy: [facility]

## 2017-11-17 DIAGNOSIS — I480 Paroxysmal atrial fibrillation: Secondary | ICD-10-CM | POA: Diagnosis not present

## 2017-12-02 ENCOUNTER — Other Ambulatory Visit: Payer: PPO

## 2017-12-02 ENCOUNTER — Ambulatory Visit (INDEPENDENT_AMBULATORY_CARE_PROVIDER_SITE_OTHER): Payer: Medicare HMO

## 2017-12-02 VITALS — BP 132/70 | HR 105 | Temp 97.9°F | Ht 68.5 in | Wt 200.0 lb

## 2017-12-02 DIAGNOSIS — I482 Chronic atrial fibrillation, unspecified: Secondary | ICD-10-CM

## 2017-12-02 DIAGNOSIS — Z0000 Encounter for general adult medical examination without abnormal findings: Secondary | ICD-10-CM | POA: Diagnosis not present

## 2017-12-02 LAB — CBC WITH DIFFERENTIAL/PLATELET
BASOS PCT: 0.8 % (ref 0.0–3.0)
Basophils Absolute: 0.1 10*3/uL (ref 0.0–0.1)
EOS PCT: 4.1 % (ref 0.0–5.0)
Eosinophils Absolute: 0.3 10*3/uL (ref 0.0–0.7)
HCT: 43 % (ref 39.0–52.0)
HEMOGLOBIN: 14.6 g/dL (ref 13.0–17.0)
Lymphocytes Relative: 23.5 % (ref 12.0–46.0)
Lymphs Abs: 1.8 10*3/uL (ref 0.7–4.0)
MCHC: 34.1 g/dL (ref 30.0–36.0)
MCV: 90.3 fl (ref 78.0–100.0)
MONO ABS: 0.6 10*3/uL (ref 0.1–1.0)
MONOS PCT: 7.3 % (ref 3.0–12.0)
Neutro Abs: 5 10*3/uL (ref 1.4–7.7)
Neutrophils Relative %: 64.3 % (ref 43.0–77.0)
Platelets: 137 10*3/uL — ABNORMAL LOW (ref 150.0–400.0)
RBC: 4.76 Mil/uL (ref 4.22–5.81)
RDW: 13.9 % (ref 11.5–15.5)
WBC: 7.8 10*3/uL (ref 4.0–10.5)

## 2017-12-02 LAB — LIPID PANEL
CHOLESTEROL: 180 mg/dL (ref 0–200)
HDL: 38.2 mg/dL — ABNORMAL LOW (ref >39.00)
LDL Cholesterol: 108 mg/dL — ABNORMAL HIGH (ref 0–99)
NONHDL: 141.93
Total CHOL/HDL Ratio: 5
Triglycerides: 170 mg/dL — ABNORMAL HIGH (ref 0.0–149.0)
VLDL: 34 mg/dL (ref 0.0–40.0)

## 2017-12-02 LAB — COMPREHENSIVE METABOLIC PANEL
ALBUMIN: 4.3 g/dL (ref 3.5–5.2)
ALK PHOS: 79 U/L (ref 39–117)
ALT: 26 U/L (ref 0–53)
AST: 21 U/L (ref 0–37)
BUN: 21 mg/dL (ref 6–23)
CALCIUM: 9.5 mg/dL (ref 8.4–10.5)
CHLORIDE: 102 meq/L (ref 96–112)
CO2: 31 mEq/L (ref 19–32)
Creatinine, Ser: 1.27 mg/dL (ref 0.40–1.50)
GFR: 59.37 mL/min — ABNORMAL LOW (ref >60.00)
Glucose, Bld: 108 mg/dL — ABNORMAL HIGH (ref 70–99)
POTASSIUM: 4.5 meq/L (ref 3.5–5.1)
SODIUM: 139 meq/L (ref 135–145)
TOTAL PROTEIN: 7.1 g/dL (ref 6.0–8.3)
Total Bilirubin: 1.5 mg/dL — ABNORMAL HIGH (ref 0.2–1.2)

## 2017-12-02 LAB — TSH: TSH: 4.35 u[IU]/mL (ref 0.35–4.50)

## 2017-12-02 NOTE — Progress Notes (Signed)
PCP notes:   Health maintenance:  No gaps identified.  Abnormal screenings:   Mini-Cog score: 19/20 MMSE - Mini Mental State Exam 12/02/2017 12/02/2017  Orientation to time 5 5  Orientation to Place 5 5  Registration 3 3  Attention/ Calculation 0 0  Recall 2 2  Recall-comments unable to recall 1 of 3 words unable to recall 1 of 3 words  Language- name 2 objects 0 0  Language- repeat 1 1  Language- follow 3 step command 3 3  Language- read & follow direction 0 0  Write a sentence 0 0  Copy design 0 0  Total score 19 19       Patient concerns:   None  Nurse concerns:  None  Next PCP appt:   12/09/17 @ 1400

## 2017-12-02 NOTE — Progress Notes (Signed)
Subjective:   [name] [name] is a 72 y.o. male who presents for Medicare Annual/Subsequent preventive examination.  Review of Systems:  N/A Cardiac Risk Factors include: advanced age (>44men, >21 women);male gender;dyslipidemia;hypertension     Objective:    Vitals: BP 132/70 (BP Location: Right Arm, Patient Position: Sitting, Cuff Size: Normal)   Pulse (!) 105   Temp 97.9 F (36.6 C) (Oral)   Ht 5' 8.5" (1.74 m) Comment: no shoes  Wt 200 lb (90.7 kg)   SpO2 97%   BMI 29.97 kg/m   Body mass index is 29.97 kg/m.  Advanced Directives 12/02/2017  Does Patient Have a Medical Advance Directive? Yes  Type of Paramedic of [place];Living will  Copy of [place] in Chart? No - copy requested    Tobacco Social History   Tobacco Use  Smoking Status Never Smoker  Smokeless Tobacco Never Used     Counseling given: No   Clinical Intake:  Pre-visit preparation completed: Yes  Pain : No/denies pain Pain Score: 0-No pain     Nutritional Status: BMI 25 -29 Overweight Nutritional Risks: None Diabetes: No  How often do you need to have someone help you when you read instructions, pamphlets, or other written materials from your doctor or pharmacy?: 1 - Never What is the last grade level you completed in school?: 12th grade  Interpreter Needed?: No  Comments: pt lives with spouse Information entered by :: LPinson, LPN  Past Medical History:  Diagnosis Date  . Atrial fibrillation ([place])   . CHF (congestive heart failure) ([place])   . Cirrhosis ([place])   . ED (erectile dysfunction)   . Hyperglycemia   . Hyperlipidemia   . Hypertension   . Internal hemorrhoids   . SCCA (squamous cell carcinoma) of skin    Left ear.  MOHS surgery 2014 [facility]  . Skin cancer of face    2012 basal cell   Past Surgical History:  Procedure Laterality Date  . A-Fib Cardiomyopathy ([place])  10/2006   EF 25 - 35%  . DOPPLER ECHOCARDIOGRAPHY   10/31/2006   EF 25-35% Mod. M.R., mild -mod TR   Family History  Problem Relation Age of Onset  . Hypertension Mother   . Cancer Mother        Bilat. breast dz, s/p mastectomy, Bilat  . Colon cancer Mother        possible  . Colon polyps Mother   . Heart disease Father        CHF, S/P CABG (67) Pacer Incr BP  . Heart disease Brother        CAD- stent and ICD/pacer  . Prostate cancer Neg Hx    Social History   Socioeconomic History  . Marital status: Married    Spouse name: None  . Number of children: 2  . Years of education: None  . Highest education level: None  Social Needs  . Financial resource strain: None  . Food insecurity - worry: None  . Food insecurity - inability: None  . Transportation needs - medical: None  . Transportation needs - non-medical: None  Occupational History  . Occupation: [employer], [place]  . Occupation: Building services engineer, Visual merchandiser  . Occupation: Semi-retired  Tobacco Use  . Smoking status: Never Smoker  . Smokeless tobacco: Never Used  Substance and Sexual Activity  . Alcohol use: Yes    Comment: h/o abuse, quit 2008, rare  use as of 2014, none since 09/2014  . Drug use: No  . Sexual activity: None  Other Topics Concern  . None  Social History Narrative   Left handed   2 sons initially, 1 died 2009-09-15 from [place], his other son is local   From [place], [place]   Works some at [employer]   Enjoys working in the yard   Married 1967   Caffeine daily    Outpatient Encounter Medications as of 12/02/2017  Medication Sig  . albuterol (PROAIR HFA) 108 (90 Base) MCG/ACT inhaler Inhale 1-2 puffs into the lungs every 6 (six) hours as needed for wheezing or shortness of breath.  . carvedilol (COREG) 25 MG tablet Take 25 mg by mouth 2 (two) times daily with a meal.    . digoxin (LANOXIN) 0.125 MG tablet Take 0.125 mg by mouth daily.  [name] doxazosin (CARDURA) 4 MG tablet Take 1 tablet (4 mg total)  by mouth at bedtime.  [name] ENTRESTO 24-26 MG   . fish oil-omega-3 fatty acids 1000 MG capsule Take 1 g by mouth daily.    . furosemide (LASIX) 20 MG tablet Take 2 tablets (40 mg total) by mouth daily as needed.  [name] levothyroxine (SYNTHROID, LEVOTHROID) 137 MCG tablet Take 1 tablet (137 mcg total) by mouth daily before breakfast.  . Melatonin 5 MG CAPS Take 5 mg by mouth daily.   . Multiple Vitamin (MULTIVITAMIN) tablet Take 1 tablet by mouth daily.    . pravastatin (PRAVACHOL) 40 MG tablet Take 1 tablet (40 mg total) by mouth daily.  [name] spironolactone (ALDACTONE) 25 MG tablet TAKE 1 TABLET BY MOUTH ONCE DAILY  . warfarin (COUMADIN) 2 MG tablet   . warfarin (COUMADIN) 6 MG tablet Take 6 mg by mouth. Take as directed by coumadin clinic  . [DISCONTINUED] losartan-hydrochlorothiazide (HYZAAR) 100-25 MG per tablet Take 1 tablet by mouth daily.     No facility-administered encounter medications on file as of 12/02/2017.     Activities of Daily Living In your present state of health, do you have any difficulty performing the following activities: 12/02/2017  Hearing? N  Vision? N  Difficulty concentrating or making decisions? N  Walking or climbing stairs? N  Dressing or bathing? N  Doing errands, shopping? N  Preparing Food and eating ? N  Using the Toilet? N  In the past six months, have you accidently leaked urine? N  Do you have problems with loss of bowel control? N  Managing your Medications? N  Managing your Finances? N  Housekeeping or managing your Housekeeping? N  Some recent data might be hidden    Patient Care Team: Tonia Ghent, MD as PCP - General (Family Medicine) Ubaldo Glassing Javier Docker, MD as Consulting Physician (Cardiology)   Assessment:   This is a routine wellness examination for Rolf.   Hearing Screening   125Hz  250Hz  500Hz  1000Hz  2000Hz  3000Hz  4000Hz  6000Hz  8000Hz   Right ear:   40 40 40  40    Left ear:   40 40 40  0      Visual Acuity Screening   Right eye Left eye  Both eyes  Without correction: 20/40 20/40 20/40   With correction:     Comments: Eye exam every 2 yrs; future appt scheduled 12/14/17 with Dr. Rick Duff   Exercise Activities and Dietary recommendations Current Exercise Habits: The patient has a physically strenous job, but has no regular exercise apart from work., Exercise limited by: None identified  Goals    .  Follow up with Primary Care Provider     Starting 12/02/2017, I will continue to take medications as prescribed and to keep appointments with PCP as scheduled.        Fall Risk Fall Risk  12/02/2017 11/26/2016 11/21/2015 11/01/2014 10/31/2013  Falls in the past year? No No No No No   Depression Screen PHQ 2/9 Scores 12/02/2017 11/26/2016 11/21/2015 11/01/2014  PHQ - 2 Score 0 0 0 0  PHQ- 9 Score 0 - - -    Cognitive Function MMSE - Mini Mental State Exam 12/02/2017 12/02/2017  Orientation to time 5 5  Orientation to Place 5 5  Registration 3 3  Attention/ Calculation 0 0  Recall 2 2  Recall-comments unable to recall 1 of 3 words unable to recall 1 of 3 words  Language- name 2 objects 0 0  Language- repeat 1 1  Language- follow 3 step command 3 3  Language- read & follow direction 0 0  Write a sentence 0 0  Copy design 0 0  Total score 19 19     PLEASE NOTE: A Mini-Cog screen was completed. Maximum score is 20. A value of 0 denotes this part of [place] MMSE was not completed or the patient failed this part of the Mini-Cog screening.   Mini-Cog Screening Orientation to Time - Max 5 pts Orientation to Place - Max 5 pts Registration - Max 3 pts Recall - Max 3 pts Language Repeat - Max 1 pts Language Follow 3 Step Command - Max 3 pts     Immunization History  Administered Date(s) Administered  . Influenza Whole 07/25/2009  . Influenza, High Dose Seasonal PF 06/28/2017  . Influenza-Unspecified 06/28/2013, 06/28/2014, 06/29/2015, 07/12/2016  . Pneumococcal Conjugate-13 11/21/2015  . Pneumococcal Polysaccharide-23 08/26/2011    . Td 12/23/1998, 07/30/2009  . Zoster 11/21/2012    Screening Tests Health Maintenance  Topic Date Due  . TETANUS/TDAP  07/31/2019  . Fecal DNA (Cologuard)  01/15/2020  . INFLUENZA VACCINE  Completed  . Hepatitis C Screening  Completed  . PNA vac Low Risk Adult  Completed    Plan:     I have personally reviewed, addressed, and noted the following in the patient's chart:  A. Medical and social history B. Use of alcohol, tobacco or illicit drugs  C. Current medications and supplements D. Functional ability and status E.  Nutritional status F.  Physical activity G. Advance directives H. List of other physicians I.  Hospitalizations, surgeries, and ER visits in previous 12 months J.  Staples to include hearing, vision, cognitive, depression L. Referrals and appointments - none  In addition, I have reviewed and discussed with patient certain preventive protocols, quality metrics, and best practice recommendations. A written personalized care plan for preventive services as well as general preventive health recommendations were provided to patient.  See attached scanned questionnaire for additional information.   Signed,   Lindell Noe, MHA, BS, LPN Health Coach

## 2017-12-02 NOTE — Patient Instructions (Signed)
[name] [name] , Thank you for taking time to come for your Medicare Wellness Visit. I appreciate your ongoing commitment to your health goals. Please review the following plan we discussed and let me know if I can assist you in the future.   These are the goals we discussed: Goals    . Follow up with Primary Care Provider     Starting 12/02/2017, I will continue to take medications as prescribed and to keep appointments with PCP as scheduled.        This is a list of the screening recommended for you and due dates:  Health Maintenance  Topic Date Due  . Tetanus Vaccine  07/31/2019  . Cologuard (Stool DNA test)  01/15/2020  . Flu Shot  Completed  .  Hepatitis C: One time screening is recommended by Center for Disease Control  (CDC) for  adults born from [birth year] through 1965.   Completed  . Pneumonia vaccines  Completed   Preventive Care for Adults  A healthy lifestyle and preventive care can promote health and wellness. Preventive health guidelines for adults include the following key practices.  . A routine yearly physical is a good way to check with your health care provider about your health and preventive screening. It is a chance to share any concerns and updates on your health and to receive a thorough exam.  . Visit your dentist for a routine exam and preventive care every 6 months. Brush your teeth twice a day and floss once a day. Good oral hygiene prevents tooth decay and gum disease.  . The frequency of eye exams is based on your age, health, family medical history, use  of contact lenses, and other factors. Follow your health care provider's recommendations for frequency of eye exams.  . Eat a healthy diet. Foods like vegetables, fruits, whole grains, low-fat dairy products, and lean protein foods contain the nutrients you need without too many calories. Decrease your intake of foods high in solid fats, added sugars, and salt. Eat the right amount of calories for you. Get information  about a proper diet from your health care provider, if necessary.  . Regular physical exercise is one of the most important things you can do for your health. Most adults should get at least 150 minutes of moderate-intensity exercise (any activity that increases your heart rate and causes you to sweat) each week. In addition, most adults need muscle-strengthening exercises on 2 or more days a week.  Silver Sneakers may be a benefit available to you. To determine eligibility, you may visit the website: www.silversneakers.com or contact program at [phone] Mon-Fri between 8AM-8PM.   . Maintain a healthy weight. The body mass index (BMI) is a screening tool to identify possible weight problems. It provides an estimate of body fat based on height and weight. Your health care provider can find your BMI and can help you achieve or maintain a healthy weight.   For adults 20 years and older: ? A BMI below 18.5 is considered underweight. ? A BMI of 18.5 to 24.9 is normal. ? A BMI of 25 to 29.9 is considered overweight. ? A BMI of 30 and above is considered obese.   . Maintain normal blood lipids and cholesterol levels by exercising and minimizing your intake of saturated fat. Eat a balanced diet with plenty of fruit and vegetables. Blood tests for lipids and cholesterol should begin at age 4 and be repeated every 5 years. If your lipid or cholesterol levels  are high, you are over 50, or you are at high risk for heart disease, you may need your cholesterol levels checked more frequently. Ongoing high lipid and cholesterol levels should be treated with medicines if diet and exercise are not working.  . If you smoke, find out from your health care provider how to quit. If you do not use tobacco, please do not start.  . If you choose to drink alcohol, please do not consume more than 2 drinks per day. One drink is considered to be 12 ounces (355 mL) of beer, 5 ounces (148 mL) of wine, or 1.5 ounces (44  mL) of liquor.  . If you are 81-68 years old, ask your health care provider if you should take aspirin to prevent strokes.  . Use sunscreen. Apply sunscreen liberally and repeatedly throughout the day. You should seek shade when your shadow is shorter than you. Protect yourself by wearing long sleeves, pants, a wide-brimmed hat, and sunglasses year round, whenever you are outdoors.  . Once a month, do a whole body skin exam, using a mirror to look at the skin on your back. Tell your health care provider of new moles, moles that have irregular borders, moles that are larger than a pencil eraser, or moles that have changed in shape or color.

## 2017-12-02 NOTE — Progress Notes (Signed)
I reviewed health advisor's note, was available for consultation, and agree with documentation and plan.  

## 2017-12-03 LAB — DIGOXIN LEVEL: Digoxin Level: 1.5 mcg/L (ref 0.8–2.0)

## 2017-12-04 NOTE — Progress Notes (Signed)
Noted. Thanks.

## 2017-12-09 ENCOUNTER — Encounter: Payer: Self-pay | Admitting: Family Medicine

## 2017-12-09 ENCOUNTER — Ambulatory Visit (INDEPENDENT_AMBULATORY_CARE_PROVIDER_SITE_OTHER): Payer: Medicare HMO | Admitting: Family Medicine

## 2017-12-09 VITALS — BP 132/70 | HR 73 | Temp 97.9°F | Ht 68.5 in | Wt 200.0 lb

## 2017-12-09 DIAGNOSIS — E039 Hypothyroidism, unspecified: Secondary | ICD-10-CM | POA: Diagnosis not present

## 2017-12-09 DIAGNOSIS — Z7189 Other specified counseling: Secondary | ICD-10-CM

## 2017-12-09 DIAGNOSIS — E7800 Pure hypercholesterolemia, unspecified: Secondary | ICD-10-CM

## 2017-12-09 DIAGNOSIS — I482 Chronic atrial fibrillation, unspecified: Secondary | ICD-10-CM

## 2017-12-09 DIAGNOSIS — D696 Thrombocytopenia, unspecified: Secondary | ICD-10-CM

## 2017-12-09 DIAGNOSIS — Z0000 Encounter for general adult medical examination without abnormal findings: Secondary | ICD-10-CM

## 2017-12-09 MED ORDER — PRAVASTATIN SODIUM 40 MG PO TABS: 40.0000 mg | ORAL_TABLET | Freq: Every day | ORAL | 3 refills | Status: DC

## 2017-12-09 MED ORDER — LEVOTHYROXINE SODIUM 137 MCG PO TABS: 137.0000 ug | ORAL_TABLET | Freq: Every day | ORAL | 3 refills | Status: DC

## 2017-12-09 MED ORDER — DOXAZOSIN MESYLATE 4 MG PO TABS: 4.0000 mg | ORAL_TABLET | Freq: Every day | ORAL | 3 refills | Status: DC

## 2017-12-09 NOTE — Progress Notes (Signed)
A fib.   Using medication without problems or lightheadedness: yes Chest pain with exertion:no Edema:no- not much edema since starting spironolactone.   Short of breath:no Still anticoagulated per cards.   Labs d/w pt.   No lasix use in the last few weeks.   He has cards f/u pending.  He isn't have sig tachy palpitations.    Mild thrombocytopenia noted.  No abnormal bleeding.  Labs d/w pt.   platelet level is stable.  Elevated Cholesterol: Using medications without problems: yes Muscle aches: no Diet compliance: encouraged.   Exercise: encouraged.  Labs d/w pt.  Weight is up so labs were worse as expected.    Hypothyroidism. TSH wnl, d/w pt.  Compliant.  No ADE on med.  No dysphagia.  No neck mass.    He has dermatology f/u pending for 12/2017.    Vaccines up to date, shingrix d/w pt.   Advance directive-wife or son equally designated if patient were incapacitated.  cologuard 2018 Prostate cancer screening and PSA options (with potential risks and benefits of testing vs not testing) were discussed along with recent recs/guidelines.  He declined testing PSA at this point. Diet and exercise d/w pt.  Weight is up, had been "cooped up in the house over the winter" and his diet is off.  Weight inc isn't just fluid, d/w pt.    Meds, vitals, and allergies reviewed.   PMH and SH reviewed  ROS: Per HPI unless specifically indicated in ROS section   GEN: nad, alert and oriented HEENT: mucous membranes moist NECK: supple w/o LA CV: IRR, not tachy. PULM: ctab, no inc wob ABD: soft, +bs EXT: no edema SKIN: no acute rash

## 2017-12-09 NOTE — Patient Instructions (Signed)
Don't change your meds for now.  Update me as needed.  Keep working on your weight.   Take care.  Glad to see you.

## 2017-12-10 DIAGNOSIS — D696 Thrombocytopenia, unspecified: Secondary | ICD-10-CM | POA: Insufficient documentation

## 2017-12-10 DIAGNOSIS — Z0000 Encounter for general adult medical examination without abnormal findings: Secondary | ICD-10-CM | POA: Insufficient documentation

## 2017-12-10 NOTE — Assessment & Plan Note (Signed)
Labs d/w pt.  Weight is up so labs were worse as expected.   Discussed with patient about diet and exercise.

## 2017-12-10 NOTE — Assessment & Plan Note (Signed)
No abnormal bleeding.  Labs d/w pt.   platelet level is stable. D/w pt.

## 2017-12-10 NOTE — Assessment & Plan Note (Addendum)
Doing well.  Continue as is. Still anticoagulated per cards.   Labs d/w pt.   No lasix use in the last few weeks.   He has cards f/u pending.  He isn't have sig tachy palpitations.   >25 minutes spent in face to face time with patient, >50% spent in counselling or coordination of care, discussing labs, A. fib, anticoagulation, diet, exercise, etc.

## 2017-12-10 NOTE — Assessment & Plan Note (Signed)
TSH wnl, d/w pt.  Compliant.  No ADE on med.  No dysphagia.  No neck mass.

## 2017-12-10 NOTE — Assessment & Plan Note (Signed)
Advance directive- wife or son equally designated if patient were incapacitated. 

## 2017-12-10 NOTE — Assessment & Plan Note (Signed)
Vaccines up to date, shingrix d/w pt.   Advance directive-wife or son equally designated if patient were incapacitated.  cologuard 2018 Prostate cancer screening and PSA options (with potential risks and benefits of testing vs not testing) were discussed along with recent recs/guidelines.  He declined testing PSA at this point. Diet and exercise d/w pt.  Weight is up, had been "cooped up in the house over the winter" and his diet is off.  Weight inc isn't just fluid, d/w pt.

## 2017-12-14 DIAGNOSIS — H353111 Nonexudative age-related macular degeneration, right eye, early dry stage: Secondary | ICD-10-CM | POA: Diagnosis not present

## 2017-12-14 DIAGNOSIS — H52223 Regular astigmatism, bilateral: Secondary | ICD-10-CM | POA: Diagnosis not present

## 2017-12-14 DIAGNOSIS — H2513 Age-related nuclear cataract, bilateral: Secondary | ICD-10-CM | POA: Diagnosis not present

## 2017-12-14 DIAGNOSIS — H5203 Hypermetropia, bilateral: Secondary | ICD-10-CM | POA: Diagnosis not present

## 2017-12-14 DIAGNOSIS — H524 Presbyopia: Secondary | ICD-10-CM | POA: Diagnosis not present

## 2017-12-14 DIAGNOSIS — H35372 Puckering of macula, left eye: Secondary | ICD-10-CM | POA: Diagnosis not present

## 2017-12-15 DIAGNOSIS — I480 Paroxysmal atrial fibrillation: Secondary | ICD-10-CM | POA: Diagnosis not present

## 2018-01-04 DIAGNOSIS — R69 Illness, unspecified: Secondary | ICD-10-CM | POA: Diagnosis not present

## 2018-01-04 DIAGNOSIS — I4891 Unspecified atrial fibrillation: Secondary | ICD-10-CM | POA: Diagnosis not present

## 2018-01-04 DIAGNOSIS — E785 Hyperlipidemia, unspecified: Secondary | ICD-10-CM | POA: Diagnosis not present

## 2018-01-04 DIAGNOSIS — I10 Essential (primary) hypertension: Secondary | ICD-10-CM | POA: Diagnosis not present

## 2018-01-05 DIAGNOSIS — L905 Scar conditions and fibrosis of skin: Secondary | ICD-10-CM | POA: Diagnosis not present

## 2018-01-05 DIAGNOSIS — C44622 Squamous cell carcinoma of skin of right upper limb, including shoulder: Secondary | ICD-10-CM | POA: Diagnosis not present

## 2018-01-12 DIAGNOSIS — I480 Paroxysmal atrial fibrillation: Secondary | ICD-10-CM | POA: Diagnosis not present

## 2018-01-20 DIAGNOSIS — C44629 Squamous cell carcinoma of skin of left upper limb, including shoulder: Secondary | ICD-10-CM | POA: Diagnosis not present

## 2018-01-20 DIAGNOSIS — L905 Scar conditions and fibrosis of skin: Secondary | ICD-10-CM | POA: Diagnosis not present

## 2018-02-03 DIAGNOSIS — D0461 Carcinoma in situ of skin of right upper limb, including shoulder: Secondary | ICD-10-CM | POA: Diagnosis not present

## 2018-02-09 DIAGNOSIS — I480 Paroxysmal atrial fibrillation: Secondary | ICD-10-CM | POA: Diagnosis not present

## 2018-03-09 DIAGNOSIS — I480 Paroxysmal atrial fibrillation: Secondary | ICD-10-CM | POA: Diagnosis not present

## 2018-03-21 DIAGNOSIS — M9903 Segmental and somatic dysfunction of lumbar region: Secondary | ICD-10-CM | POA: Diagnosis not present

## 2018-03-21 DIAGNOSIS — M9904 Segmental and somatic dysfunction of sacral region: Secondary | ICD-10-CM | POA: Diagnosis not present

## 2018-03-21 DIAGNOSIS — M545 Low back pain: Secondary | ICD-10-CM | POA: Diagnosis not present

## 2018-03-23 DIAGNOSIS — M9904 Segmental and somatic dysfunction of sacral region: Secondary | ICD-10-CM | POA: Diagnosis not present

## 2018-03-23 DIAGNOSIS — M545 Low back pain: Secondary | ICD-10-CM | POA: Diagnosis not present

## 2018-03-23 DIAGNOSIS — M9903 Segmental and somatic dysfunction of lumbar region: Secondary | ICD-10-CM | POA: Diagnosis not present

## 2018-04-11 DIAGNOSIS — I480 Paroxysmal atrial fibrillation: Secondary | ICD-10-CM | POA: Diagnosis not present

## 2018-05-12 DIAGNOSIS — I480 Paroxysmal atrial fibrillation: Secondary | ICD-10-CM | POA: Diagnosis not present

## 2018-05-26 DIAGNOSIS — I480 Paroxysmal atrial fibrillation: Secondary | ICD-10-CM | POA: Diagnosis not present

## 2018-06-16 DIAGNOSIS — R69 Illness, unspecified: Secondary | ICD-10-CM | POA: Diagnosis not present

## 2018-06-28 DIAGNOSIS — I480 Paroxysmal atrial fibrillation: Secondary | ICD-10-CM | POA: Diagnosis not present

## 2018-07-11 DIAGNOSIS — D0461 Carcinoma in situ of skin of right upper limb, including shoulder: Secondary | ICD-10-CM | POA: Diagnosis not present

## 2018-07-11 DIAGNOSIS — L821 Other seborrheic keratosis: Secondary | ICD-10-CM | POA: Diagnosis not present

## 2018-07-11 DIAGNOSIS — D485 Neoplasm of uncertain behavior of skin: Secondary | ICD-10-CM | POA: Diagnosis not present

## 2018-07-11 DIAGNOSIS — L570 Actinic keratosis: Secondary | ICD-10-CM | POA: Diagnosis not present

## 2018-07-11 DIAGNOSIS — X32XXXA Exposure to sunlight, initial encounter: Secondary | ICD-10-CM | POA: Diagnosis not present

## 2018-07-11 DIAGNOSIS — Z08 Encounter for follow-up examination after completed treatment for malignant neoplasm: Secondary | ICD-10-CM | POA: Diagnosis not present

## 2018-07-11 DIAGNOSIS — Z85828 Personal history of other malignant neoplasm of skin: Secondary | ICD-10-CM | POA: Diagnosis not present

## 2018-07-18 DIAGNOSIS — I10 Essential (primary) hypertension: Secondary | ICD-10-CM | POA: Diagnosis not present

## 2018-07-18 DIAGNOSIS — E785 Hyperlipidemia, unspecified: Secondary | ICD-10-CM | POA: Diagnosis not present

## 2018-07-18 DIAGNOSIS — R69 Illness, unspecified: Secondary | ICD-10-CM | POA: Diagnosis not present

## 2018-07-18 DIAGNOSIS — I4891 Unspecified atrial fibrillation: Secondary | ICD-10-CM | POA: Diagnosis not present

## 2018-08-02 DIAGNOSIS — I480 Paroxysmal atrial fibrillation: Secondary | ICD-10-CM | POA: Diagnosis not present

## 2018-08-22 DIAGNOSIS — D0461 Carcinoma in situ of skin of right upper limb, including shoulder: Secondary | ICD-10-CM | POA: Diagnosis not present

## 2018-08-22 DIAGNOSIS — L570 Actinic keratosis: Secondary | ICD-10-CM | POA: Diagnosis not present

## 2018-09-06 DIAGNOSIS — I480 Paroxysmal atrial fibrillation: Secondary | ICD-10-CM | POA: Diagnosis not present

## 2018-10-11 DIAGNOSIS — I480 Paroxysmal atrial fibrillation: Secondary | ICD-10-CM | POA: Diagnosis not present

## 2018-11-15 DIAGNOSIS — I480 Paroxysmal atrial fibrillation: Secondary | ICD-10-CM | POA: Diagnosis not present

## 2018-11-28 ENCOUNTER — Other Ambulatory Visit: Payer: Self-pay | Admitting: Family Medicine

## 2018-12-05 ENCOUNTER — Other Ambulatory Visit: Payer: Self-pay | Admitting: Family Medicine

## 2018-12-05 ENCOUNTER — Other Ambulatory Visit (INDEPENDENT_AMBULATORY_CARE_PROVIDER_SITE_OTHER): Payer: Medicare HMO

## 2018-12-05 DIAGNOSIS — E039 Hypothyroidism, unspecified: Secondary | ICD-10-CM

## 2018-12-05 DIAGNOSIS — E7800 Pure hypercholesterolemia, unspecified: Secondary | ICD-10-CM

## 2018-12-05 DIAGNOSIS — I482 Chronic atrial fibrillation, unspecified: Secondary | ICD-10-CM | POA: Diagnosis not present

## 2018-12-05 DIAGNOSIS — D696 Thrombocytopenia, unspecified: Secondary | ICD-10-CM

## 2018-12-05 LAB — LIPID PANEL
CHOLESTEROL: 180 mg/dL (ref 0–200)
HDL: 41.6 mg/dL (ref >39.00)
LDL Cholesterol: 104 mg/dL — ABNORMAL HIGH (ref 0–99)
NonHDL: 138.3
Total CHOL/HDL Ratio: 4
Triglycerides: 170 mg/dL — ABNORMAL HIGH (ref 0.0–149.0)
VLDL: 34 mg/dL (ref 0.0–40.0)

## 2018-12-05 LAB — COMPREHENSIVE METABOLIC PANEL
ALBUMIN: 4.4 g/dL (ref 3.5–5.2)
ALT: 21 U/L (ref 0–53)
AST: 16 U/L (ref 0–37)
Alkaline Phosphatase: 60 U/L (ref 39–117)
BUN: 18 mg/dL (ref 6–23)
CO2: 31 mEq/L (ref 19–32)
Calcium: 9.3 mg/dL (ref 8.4–10.5)
Chloride: 99 mEq/L (ref 96–112)
Creatinine, Ser: 1.22 mg/dL (ref 0.40–1.50)
GFR: 58.34 mL/min — ABNORMAL LOW (ref >60.00)
Glucose, Bld: 107 mg/dL — ABNORMAL HIGH (ref 70–99)
Potassium: 4.1 mEq/L (ref 3.5–5.1)
Sodium: 139 mEq/L (ref 135–145)
Total Bilirubin: 1.4 mg/dL — ABNORMAL HIGH (ref 0.2–1.2)
Total Protein: 7.1 g/dL (ref 6.0–8.3)

## 2018-12-05 LAB — CBC WITH DIFFERENTIAL/PLATELET
Basophils Absolute: 0.1 10*3/uL (ref 0.0–0.1)
Basophils Relative: 0.8 % (ref 0.0–3.0)
Eosinophils Absolute: 0.3 10*3/uL (ref 0.0–0.7)
Eosinophils Relative: 3.4 % (ref 0.0–5.0)
HCT: 42.7 % (ref 39.0–52.0)
Hemoglobin: 14.8 g/dL (ref 13.0–17.0)
LYMPHS ABS: 2.1 10*3/uL (ref 0.7–4.0)
Lymphocytes Relative: 23.7 % (ref 12.0–46.0)
MCHC: 34.7 g/dL (ref 30.0–36.0)
MCV: 87.6 fl (ref 78.0–100.0)
Monocytes Absolute: 0.6 10*3/uL (ref 0.1–1.0)
Monocytes Relative: 7.2 % (ref 3.0–12.0)
NEUTROS ABS: 5.7 10*3/uL (ref 1.4–7.7)
NEUTROS PCT: 64.9 % (ref 43.0–77.0)
Platelets: 145 10*3/uL — ABNORMAL LOW (ref 150.0–400.0)
RBC: 4.87 Mil/uL (ref 4.22–5.81)
RDW: 14 % (ref 11.5–15.5)
WBC: 8.8 10*3/uL (ref 4.0–10.5)

## 2018-12-05 LAB — TSH: TSH: 2.55 u[IU]/mL (ref 0.35–4.50)

## 2018-12-06 LAB — DIGOXIN LEVEL: Digoxin Level: 1.1 mcg/L (ref 0.8–2.0)

## 2018-12-12 ENCOUNTER — Telehealth: Payer: Self-pay

## 2018-12-12 ENCOUNTER — Encounter: Payer: Self-pay | Admitting: Family Medicine

## 2018-12-12 ENCOUNTER — Ambulatory Visit (INDEPENDENT_AMBULATORY_CARE_PROVIDER_SITE_OTHER)
Admission: RE | Admit: 2018-12-12 | Discharge: 2018-12-12 | Disposition: A | Discharge status: Home / Self Care | Payer: Medicare HMO | Source: Ambulatory Visit | Attending: Family Medicine | Admitting: Family Medicine

## 2018-12-12 ENCOUNTER — Ambulatory Visit (INDEPENDENT_AMBULATORY_CARE_PROVIDER_SITE_OTHER): Payer: Medicare HMO | Admitting: Family Medicine

## 2018-12-12 ENCOUNTER — Ambulatory Visit: Payer: Medicare HMO

## 2018-12-12 ENCOUNTER — Other Ambulatory Visit: Payer: Self-pay

## 2018-12-12 VITALS — BP 122/70 | HR 101 | Temp 98.9°F | Ht 68.5 in | Wt 201.0 lb

## 2018-12-12 DIAGNOSIS — R05 Cough: Secondary | ICD-10-CM | POA: Diagnosis not present

## 2018-12-12 DIAGNOSIS — I10 Essential (primary) hypertension: Secondary | ICD-10-CM | POA: Diagnosis not present

## 2018-12-12 DIAGNOSIS — Z7189 Other specified counseling: Secondary | ICD-10-CM

## 2018-12-12 DIAGNOSIS — K7460 Unspecified cirrhosis of liver: Secondary | ICD-10-CM

## 2018-12-12 DIAGNOSIS — R059 Cough, unspecified: Secondary | ICD-10-CM

## 2018-12-12 DIAGNOSIS — I4891 Unspecified atrial fibrillation: Secondary | ICD-10-CM | POA: Diagnosis not present

## 2018-12-12 DIAGNOSIS — E7800 Pure hypercholesterolemia, unspecified: Secondary | ICD-10-CM | POA: Diagnosis not present

## 2018-12-12 DIAGNOSIS — J069 Acute upper respiratory infection, unspecified: Secondary | ICD-10-CM

## 2018-12-12 DIAGNOSIS — Z0000 Encounter for general adult medical examination without abnormal findings: Secondary | ICD-10-CM

## 2018-12-12 DIAGNOSIS — E039 Hypothyroidism, unspecified: Secondary | ICD-10-CM | POA: Diagnosis not present

## 2018-12-12 MED ORDER — DOXAZOSIN MESYLATE 4 MG PO TABS: 4.0000 mg | ORAL_TABLET | Freq: Every day | ORAL | 3 refills | Status: DC

## 2018-12-12 MED ORDER — ALBUTEROL SULFATE HFA 108 (90 BASE) MCG/ACT IN AERS: 1.0000 | INHALATION_SPRAY | Freq: Four times a day (QID) | RESPIRATORY_TRACT | 1 refills | Status: DC | PRN

## 2018-12-12 MED ORDER — PRAVASTATIN SODIUM 40 MG PO TABS: 40.0000 mg | ORAL_TABLET | Freq: Every day | ORAL | 3 refills | Status: DC

## 2018-12-12 MED ORDER — DOXYCYCLINE HYCLATE 100 MG PO TABS: 100.0000 mg | ORAL_TABLET | Freq: Two times a day (BID) | ORAL | 0 refills | Status: DC

## 2018-12-12 MED ORDER — ENTRESTO 24-26 MG PO TABS: 1.0000 | ORAL_TABLET | Freq: Two times a day (BID) | ORAL | Status: DC

## 2018-12-12 MED ORDER — LEVOTHYROXINE SODIUM 137 MCG PO TABS: 137.0000 ug | ORAL_TABLET | Freq: Every day | ORAL | 3 refills | Status: DC

## 2018-12-12 NOTE — Telephone Encounter (Signed)
Pts wife said [facility] [address] did not get albuterol inhaler but did get others meds sent today. I called [facility] [address] and spoke with Crystal and she did get albuterol inhaler but ins will not approve the proair inhaler but will approve the ventolin inhaler. Need new rx. I spoke with pts wife and they are home now and request albuterol inhaler be sent to [facility]; Mrs Holsopple request cb when done.

## 2018-12-12 NOTE — Patient Instructions (Signed)
Go to the lab on the way out.  We'll contact you with your xray report. Use the inhaler as needed.  Start doxycycline.  Call the cardiology clinic today and tell them you are on doxycycline so they can address your coumadin use.  Take care.  Glad to see you.  Update [place] as needed.

## 2018-12-12 NOTE — Telephone Encounter (Signed)
Sent. Thanks.   

## 2018-12-12 NOTE — Progress Notes (Signed)
I have personally reviewed the Medicare Annual Wellness questionnaire and have noted 1. The patient's medical and social history 2. Their use of alcohol, tobacco or illicit drugs 3. Their current medications and supplements 4. The patient's functional ability including ADL's, fall risks, home safety risks and hearing or visual             impairment. 5. Diet and physical activities 6. Evidence for depression or mood disorders  The patients weight, height, BMI have been recorded in the chart and visual acuity is per eye clinic.  I have made referrals, counseling and provided education to the patient based review of the above and I have provided the pt with a written personalized care plan for preventive services.  Provider list updated- see scanned forms.  Routine anticipatory guidance given to patient.  See health maintenance. The possibility exists that previously documented standard health maintenance information may have been brought forward from a previous encounter into this note.  If needed, that same information has been updated to reflect the current situation based on today's encounter.    Flu 2019 Shingles 2014 PNA UTD Tetanus 2010 Cologuard 2018 Prostate cancer screening and PSA options(with potential risks and benefits of testing vs not testing) were discussed along with recent recs/guidelines. He declined testing PSAat this point.   Advance directive-wife or son equally designated if patient were incapacitated.  Cognitive function addressed- see scanned forms- and if abnormal then additional documentation follows.    AF.  On warfarin. He has cards f/u pending.  No chest pain.  No adverse effect on Coumadin.  Hypertension:    Using medication without problems or lightheadedness: yes Chest pain with exertion: No  Edema:minimal to none Short of breath: at baseline, with sig exertion, this is gradually worse over the years, but not in the last few months or year. Taking  lasix about once a week.    Elevated Cholesterol: Using medications without problems: yes Muscle aches: some cramps, episodically.  Tolerable.  Not exertional.   Diet compliance: encouraged.  Exercise: encouraged, he is motivated to get out in the yard.    Hypothyroidism.  No ADE on med.  Compliant.  No neck mass.    Recent URI w/o fever.  Sx started yesterday.  Some cough.  Some chest congestion.  Some discolored sputum.  No vomiting, some loose stools.  No ear pain.  Restarted prn SABA but it is out of date.  No facial pain.  Needed refill on albuterol.  H/o cirrhosis noted.  Prev GI eval done.  No recent jaundice.  No abd pain.  No etoh.    PMH and SH reviewed  Meds, vitals, and allergies reviewed.   ROS: Per HPI.  Unless specifically indicated otherwise in HPI, the patient denies:  General: fever. Eyes: acute vision changes ENT: sore throat Cardiovascular: chest pain Respiratory: SOB GI: vomiting GU: dysuria Musculoskeletal: acute back pain Derm: acute rash Neuro: acute motor dysfunction Psych: worsening mood Endocrine: polydipsia Heme: bleeding Allergy: hayfever  GEN: nad, alert and oriented HEENT: mucous membranes moist NECK: supple w/o LA CV: IRR, minimally tachy PULM: no wheeze, but scattered diffuse rhonchi, no inc wob ABD: soft, +bs EXT: trace BLE edema SKIN: no acute rash

## 2018-12-14 DIAGNOSIS — K7460 Unspecified cirrhosis of liver: Secondary | ICD-10-CM | POA: Insufficient documentation

## 2018-12-14 DIAGNOSIS — J069 Acute upper respiratory infection, unspecified: Secondary | ICD-10-CM | POA: Insufficient documentation

## 2018-12-14 NOTE — Assessment & Plan Note (Signed)
Flu 2019 Shingles 2014 PNA UTD Tetanus 2010 Cologuard 2018 Prostate cancer screening and PSA options(with potential risks and benefits of testing vs not testing) were discussed along with recent recs/guidelines. He declined testing PSAat this point.   Advance directive-wife or son equally designated if patient were incapacitated.  Cognitive function addressed- see scanned forms- and if abnormal then additional documentation follows.

## 2018-12-14 NOTE — Assessment & Plan Note (Signed)
On warfarin. He has cards f/u pending.  No chest pain.  No adverse effect on Coumadin.

## 2018-12-14 NOTE — Assessment & Plan Note (Signed)
Presumed bronchitis.   Use SABA as needed.  Start doxycycline.  He will call the cardiology clinic today and tell them he is on doxycycline so they can address his Coumadin use.  I discussed this with the patient in detail and he understands that he needs to make this phone call given the potential interaction between doxycycline and Coumadin. See notes on chest x-ray.  He can update me as needed.  Okay for outpatient follow-up.

## 2018-12-14 NOTE — Assessment & Plan Note (Signed)
Controlled.  Continue as is.  He agrees.

## 2018-12-14 NOTE — Assessment & Plan Note (Signed)
Advance directive- wife or son equally designated if patient were incapacitated. 

## 2018-12-14 NOTE — Assessment & Plan Note (Signed)
H/o cirrhosis noted.  Prev GI eval done.  No recent jaundice.  No abd pain.  No etoh.   Continue observation for now.  Update me as needed.  He agrees.

## 2018-12-14 NOTE — Assessment & Plan Note (Signed)
Labs discussed with patient.  No change in meds.  Needs to continue work on diet and exercise.  He agrees.

## 2018-12-14 NOTE — Assessment & Plan Note (Signed)
Continue as is.  He agrees.  No adverse effect on medications.

## 2018-12-22 DIAGNOSIS — I480 Paroxysmal atrial fibrillation: Secondary | ICD-10-CM | POA: Diagnosis not present

## 2019-01-02 DIAGNOSIS — E785 Hyperlipidemia, unspecified: Secondary | ICD-10-CM | POA: Diagnosis not present

## 2019-01-02 DIAGNOSIS — I10 Essential (primary) hypertension: Secondary | ICD-10-CM | POA: Diagnosis not present

## 2019-01-02 DIAGNOSIS — R69 Illness, unspecified: Secondary | ICD-10-CM | POA: Diagnosis not present

## 2019-01-02 DIAGNOSIS — I4891 Unspecified atrial fibrillation: Secondary | ICD-10-CM | POA: Diagnosis not present

## 2019-01-02 DIAGNOSIS — I480 Paroxysmal atrial fibrillation: Secondary | ICD-10-CM | POA: Diagnosis not present

## 2019-01-09 DIAGNOSIS — I480 Paroxysmal atrial fibrillation: Secondary | ICD-10-CM | POA: Diagnosis not present

## 2019-02-08 DIAGNOSIS — I4891 Unspecified atrial fibrillation: Secondary | ICD-10-CM | POA: Diagnosis not present

## 2019-03-07 DIAGNOSIS — I4891 Unspecified atrial fibrillation: Secondary | ICD-10-CM | POA: Diagnosis not present

## 2019-04-10 IMAGING — DX CHEST - 2 VIEW
2 series · 2 of 2 positions shown · non-contrast
Comparison: 02/11/2017

CLINICAL DATA: Cough.

EXAM:
CHEST - 2 VIEW

[chest pa]
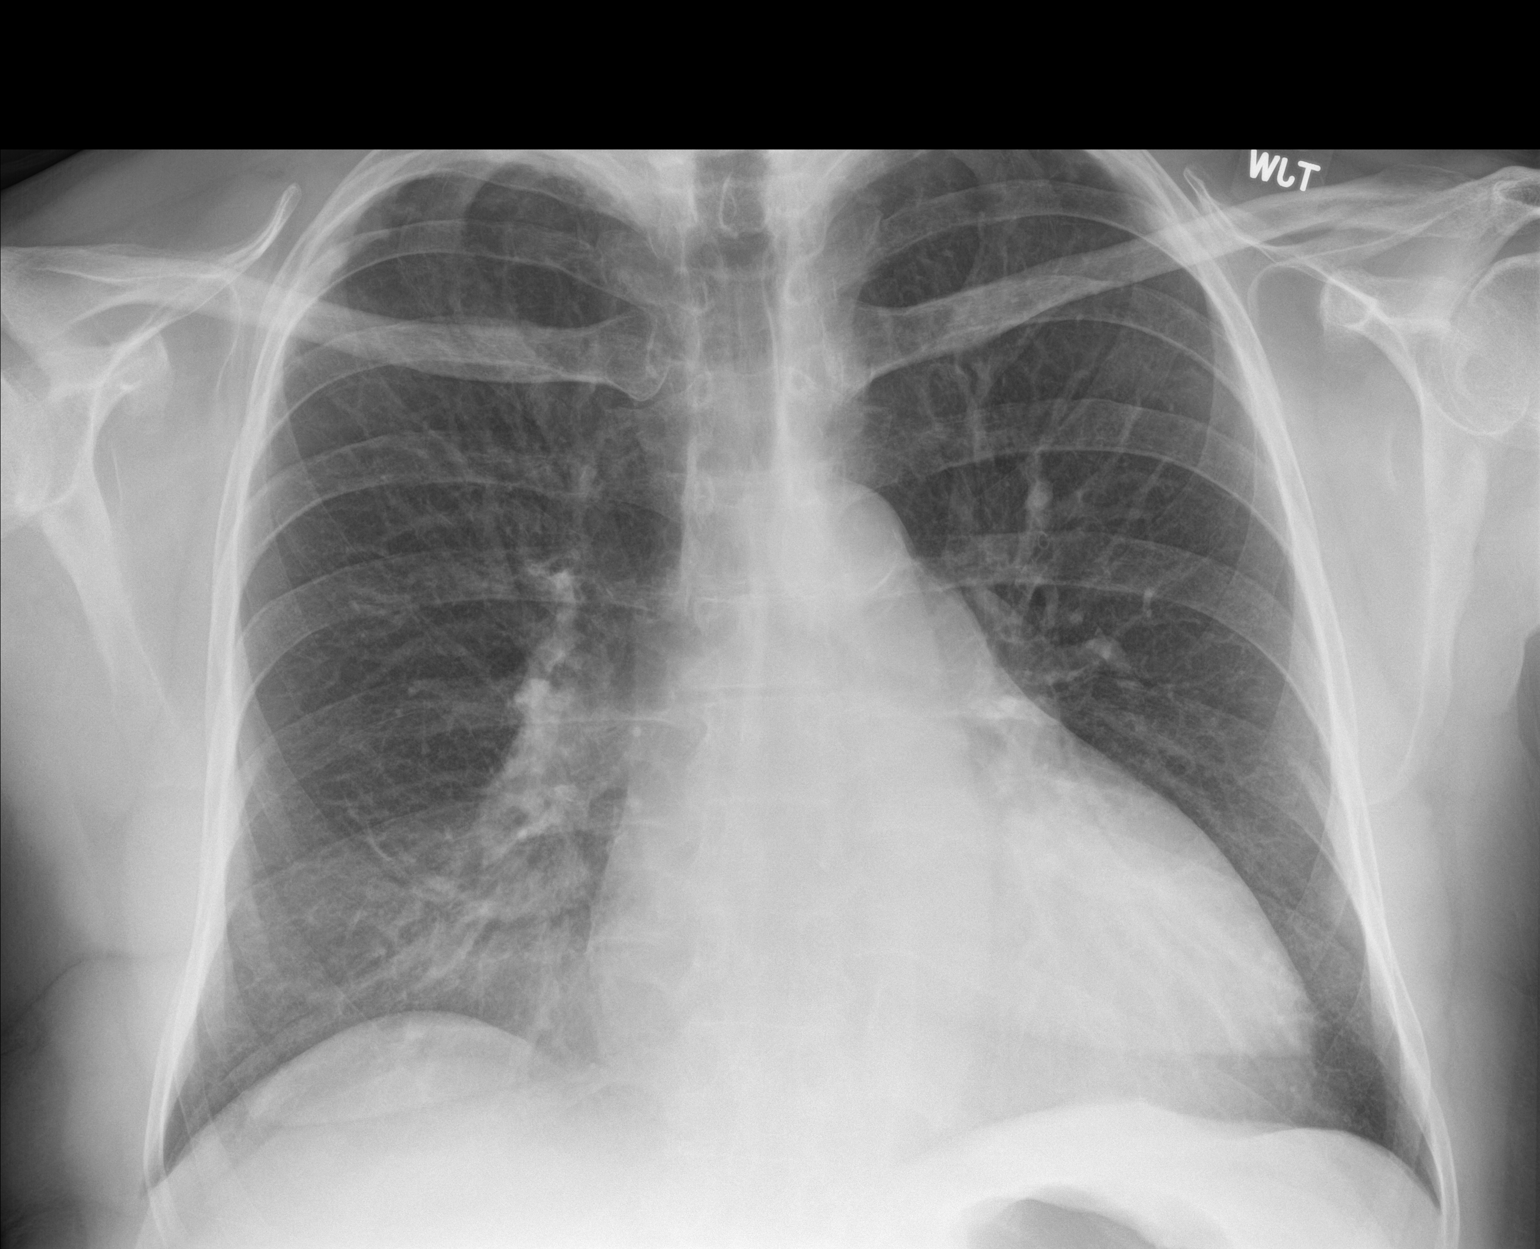

[chest lat]
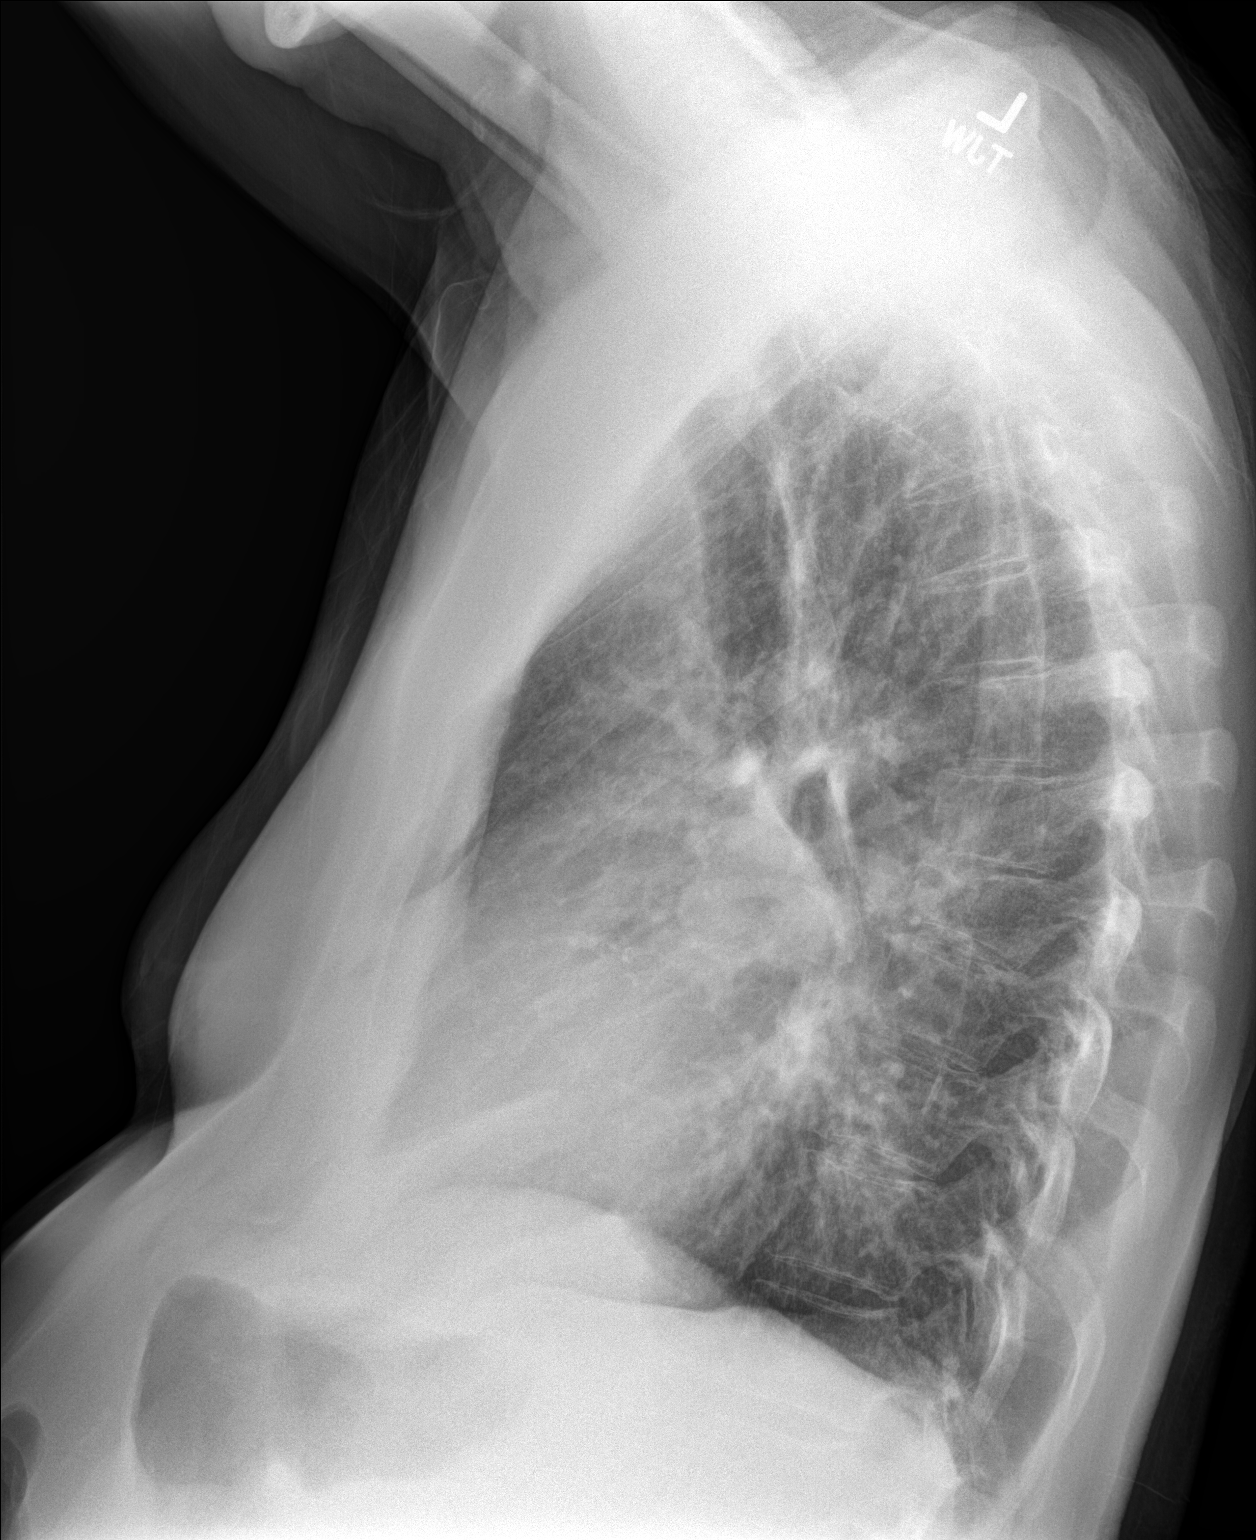

[2 of 2 positions shown; findings below may reference images not displayed]

FINDINGS: There is chronic cardiomegaly. Main pulmonary arteries are slightly
prominent. Aortic atherosclerosis.

No infiltrates or effusions. Subtle density at the right lung base
is felt to represent a small right pericardial fat pad demonstrated
on the CT scan of 11/28/2014.

There is a new mild compression fracture of 1 of the midthoracic
vertebra, probably T8.
IMPRESSION: 1. Chronic cardiomegaly.
2.  Aortic Atherosclerosis ([id number].J).
3. New mild compression fracture in the midthoracic spine, probably
T8.
4. The lungs are clear.

## 2019-04-13 DIAGNOSIS — I4891 Unspecified atrial fibrillation: Secondary | ICD-10-CM | POA: Diagnosis not present

## 2019-05-11 DIAGNOSIS — I4891 Unspecified atrial fibrillation: Secondary | ICD-10-CM | POA: Diagnosis not present

## 2019-05-26 DIAGNOSIS — R69 Illness, unspecified: Secondary | ICD-10-CM | POA: Diagnosis not present

## 2019-06-21 DIAGNOSIS — I4891 Unspecified atrial fibrillation: Secondary | ICD-10-CM | POA: Diagnosis not present

## 2019-07-04 DIAGNOSIS — R69 Illness, unspecified: Secondary | ICD-10-CM | POA: Diagnosis not present

## 2019-07-04 DIAGNOSIS — I4891 Unspecified atrial fibrillation: Secondary | ICD-10-CM | POA: Diagnosis not present

## 2019-07-04 DIAGNOSIS — E785 Hyperlipidemia, unspecified: Secondary | ICD-10-CM | POA: Diagnosis not present

## 2019-07-04 DIAGNOSIS — I10 Essential (primary) hypertension: Secondary | ICD-10-CM | POA: Diagnosis not present

## 2019-07-25 DIAGNOSIS — I4891 Unspecified atrial fibrillation: Secondary | ICD-10-CM | POA: Diagnosis not present

## 2019-08-30 DIAGNOSIS — I4891 Unspecified atrial fibrillation: Secondary | ICD-10-CM | POA: Diagnosis not present

## 2019-09-13 DIAGNOSIS — I4891 Unspecified atrial fibrillation: Secondary | ICD-10-CM | POA: Diagnosis not present

## 2019-10-17 DIAGNOSIS — I4891 Unspecified atrial fibrillation: Secondary | ICD-10-CM | POA: Diagnosis not present

## 2019-11-12 ENCOUNTER — Ambulatory Visit: Payer: Medicare HMO | Attending: Internal Medicine

## 2019-11-12 DIAGNOSIS — Z23 Encounter for immunization: Secondary | ICD-10-CM

## 2019-11-12 NOTE — Progress Notes (Signed)
   Covid-19 Vaccination Clinic  Name:  [name] [name]    MRN: [id number] DOB: Aug 02, 1946  11/12/2019  [name] [name] was observed post Covid-19 immunization for 15 minutes without incidence. He was provided with Vaccine Information Sheet and instruction to access the V-Safe system.   [name] [name] was instructed to call 911 with any severe reactions post vaccine: [name] Difficulty breathing  . Swelling of your face and throat  . A fast heartbeat  . A bad rash all over your body  . Dizziness and weakness    Immunizations Administered    Name Date Dose VIS Date Route   Pfizer COVID-19 Vaccine 11/12/2019  8:41 AM 0.3 mL 09/08/2019 Intramuscular   Manufacturer: [place]   Lot: [id number]   [place]: [id number]

## 2019-11-14 DIAGNOSIS — I4891 Unspecified atrial fibrillation: Secondary | ICD-10-CM | POA: Diagnosis not present

## 2019-12-03 ENCOUNTER — Other Ambulatory Visit: Payer: Self-pay | Admitting: Family Medicine

## 2019-12-03 DIAGNOSIS — I4891 Unspecified atrial fibrillation: Secondary | ICD-10-CM

## 2019-12-03 DIAGNOSIS — D696 Thrombocytopenia, unspecified: Secondary | ICD-10-CM

## 2019-12-03 DIAGNOSIS — I10 Essential (primary) hypertension: Secondary | ICD-10-CM

## 2019-12-05 ENCOUNTER — Ambulatory Visit: Payer: Medicare HMO | Attending: Internal Medicine

## 2019-12-05 DIAGNOSIS — Z23 Encounter for immunization: Secondary | ICD-10-CM | POA: Insufficient documentation

## 2019-12-05 NOTE — Progress Notes (Signed)
   Covid-19 Vaccination Clinic  Name:  [name] [name]    MRN: [id number] DOB: 02-19-1946  12/05/2019  [name] was observed post Covid-19 immunization for 15 minutes without incident. He was provided with Vaccine Information Sheet and instruction to access the V-Safe system.   [name] was instructed to call 911 with any severe reactions post vaccine: [name] Difficulty breathing  . Swelling of face and throat  . A fast heartbeat  . A bad rash all over body  . Dizziness and weakness   Immunizations Administered    Name Date Dose VIS Date Route   Pfizer COVID-19 Vaccine 12/05/2019  3:27 PM 0.3 mL 09/08/2019 Intramuscular   Manufacturer: [place]   Lot: [id number]   [place]: [id number]

## 2019-12-10 ENCOUNTER — Other Ambulatory Visit: Payer: Self-pay | Admitting: Family Medicine

## 2019-12-11 ENCOUNTER — Other Ambulatory Visit: Payer: Self-pay

## 2019-12-11 ENCOUNTER — Other Ambulatory Visit (INDEPENDENT_AMBULATORY_CARE_PROVIDER_SITE_OTHER): Payer: Medicare HMO

## 2019-12-11 DIAGNOSIS — I4891 Unspecified atrial fibrillation: Secondary | ICD-10-CM | POA: Diagnosis not present

## 2019-12-11 DIAGNOSIS — D696 Thrombocytopenia, unspecified: Secondary | ICD-10-CM | POA: Diagnosis not present

## 2019-12-11 DIAGNOSIS — I10 Essential (primary) hypertension: Secondary | ICD-10-CM

## 2019-12-11 LAB — CBC WITH DIFFERENTIAL/PLATELET
Basophils Absolute: 0.1 10*3/uL (ref 0.0–0.1)
Basophils Relative: 1 % (ref 0.0–3.0)
Eosinophils Absolute: 0.2 10*3/uL (ref 0.0–0.7)
Eosinophils Relative: 2.7 % (ref 0.0–5.0)
HCT: 42.4 % (ref 39.0–52.0)
Hemoglobin: 14.4 g/dL (ref 13.0–17.0)
Lymphocytes Relative: 24.3 % (ref 12.0–46.0)
Lymphs Abs: 1.9 10*3/uL (ref 0.7–4.0)
MCHC: 33.9 g/dL (ref 30.0–36.0)
MCV: 88.7 fl (ref 78.0–100.0)
Monocytes Absolute: 0.7 10*3/uL (ref 0.1–1.0)
Monocytes Relative: 8.5 % (ref 3.0–12.0)
Neutro Abs: 5 10*3/uL (ref 1.4–7.7)
Neutrophils Relative %: 63.5 % (ref 43.0–77.0)
Platelets: 141 10*3/uL — ABNORMAL LOW (ref 150.0–400.0)
RBC: 4.78 Mil/uL (ref 4.22–5.81)
RDW: 13.7 % (ref 11.5–15.5)
WBC: 7.9 10*3/uL (ref 4.0–10.5)

## 2019-12-11 LAB — LIPID PANEL
Cholesterol: 177 mg/dL (ref 0–200)
HDL: 39.6 mg/dL (ref >39.00)
LDL Cholesterol: 107 mg/dL — ABNORMAL HIGH (ref 0–99)
NonHDL: 137.28
Total CHOL/HDL Ratio: 4
Triglycerides: 151 mg/dL — ABNORMAL HIGH (ref 0.0–149.0)
VLDL: 30.2 mg/dL (ref 0.0–40.0)

## 2019-12-11 LAB — COMPREHENSIVE METABOLIC PANEL
ALT: 20 U/L (ref 0–53)
AST: 19 U/L (ref 0–37)
Albumin: 4.2 g/dL (ref 3.5–5.2)
Alkaline Phosphatase: 55 U/L (ref 39–117)
BUN: 22 mg/dL (ref 6–23)
CO2: 29 mEq/L (ref 19–32)
Calcium: 9.1 mg/dL (ref 8.4–10.5)
Chloride: 100 mEq/L (ref 96–112)
Creatinine, Ser: 1.27 mg/dL (ref 0.40–1.50)
GFR: 55.54 mL/min — ABNORMAL LOW (ref >60.00)
Glucose, Bld: 115 mg/dL — ABNORMAL HIGH (ref 70–99)
Potassium: 4.5 mEq/L (ref 3.5–5.1)
Sodium: 138 mEq/L (ref 135–145)
Total Bilirubin: 1.2 mg/dL (ref 0.2–1.2)
Total Protein: 6.8 g/dL (ref 6.0–8.3)

## 2019-12-11 LAB — TSH: TSH: 4.13 u[IU]/mL (ref 0.35–4.50)

## 2019-12-12 LAB — DIGOXIN LEVEL: Digoxin Level: 1.2 mcg/L (ref 0.8–2.0)

## 2019-12-18 ENCOUNTER — Encounter: Payer: Self-pay | Admitting: Family Medicine

## 2019-12-18 ENCOUNTER — Other Ambulatory Visit: Payer: Self-pay

## 2019-12-18 ENCOUNTER — Ambulatory Visit (INDEPENDENT_AMBULATORY_CARE_PROVIDER_SITE_OTHER): Payer: Medicare HMO | Admitting: Family Medicine

## 2019-12-18 VITALS — BP 122/60 | HR 104 | Temp 97.2°F | Ht 68.5 in | Wt 197.1 lb

## 2019-12-18 DIAGNOSIS — E7800 Pure hypercholesterolemia, unspecified: Secondary | ICD-10-CM | POA: Diagnosis not present

## 2019-12-18 DIAGNOSIS — Z0000 Encounter for general adult medical examination without abnormal findings: Secondary | ICD-10-CM | POA: Diagnosis not present

## 2019-12-18 DIAGNOSIS — L989 Disorder of the skin and subcutaneous tissue, unspecified: Secondary | ICD-10-CM | POA: Diagnosis not present

## 2019-12-18 DIAGNOSIS — I10 Essential (primary) hypertension: Secondary | ICD-10-CM

## 2019-12-18 DIAGNOSIS — Z1211 Encounter for screening for malignant neoplasm of colon: Secondary | ICD-10-CM

## 2019-12-18 DIAGNOSIS — Z7189 Other specified counseling: Secondary | ICD-10-CM

## 2019-12-18 DIAGNOSIS — I4891 Unspecified atrial fibrillation: Secondary | ICD-10-CM | POA: Diagnosis not present

## 2019-12-18 DIAGNOSIS — E039 Hypothyroidism, unspecified: Secondary | ICD-10-CM | POA: Diagnosis not present

## 2019-12-18 MED ORDER — PRAVASTATIN SODIUM 40 MG PO TABS: 40.0000 mg | ORAL_TABLET | Freq: Every day | ORAL | 3 refills | Status: DC

## 2019-12-18 MED ORDER — LEVOTHYROXINE SODIUM 137 MCG PO TABS: ORAL_TABLET | ORAL | 3 refills | Status: DC

## 2019-12-18 MED ORDER — CEPHALEXIN 500 MG PO CAPS: 500.0000 mg | ORAL_CAPSULE | Freq: Three times a day (TID) | ORAL | 0 refills | Status: DC

## 2019-12-18 MED ORDER — DOXAZOSIN MESYLATE 4 MG PO TABS: 4.0000 mg | ORAL_TABLET | Freq: Every day | ORAL | 3 refills | Status: DC

## 2019-12-18 NOTE — Progress Notes (Signed)
This visit occurred during the SARS-CoV-2 public health emergency.  Safety protocols were in place, including screening questions prior to the visit, additional usage of staff PPE, and extensive cleaning of exam room while observing appropriate contact time as indicated for disinfecting solutions.  I have personally reviewed the Medicare Annual Wellness questionnaire and have noted 1. The patient's medical and social history 2. Their use of alcohol, tobacco or illicit drugs 3. Their current medications and supplements 4. The patient's functional ability including ADL's, fall risks, home safety risks and hearing or visual             impairment. 5. Diet and physical activities 6. Evidence for depression or mood disorders  The patients weight, height, BMI have been recorded in the chart and visual acuity is per eye clinic.  I have made referrals, counseling and provided education to the patient based review of the above and I have provided the pt with a written personalized care plan for preventive services.  Provider list updated- see scanned forms.  Routine anticipatory guidance given to patient.  See health maintenance. The possibility exists that previously documented standard health maintenance information may have been brought forward from a previous encounter into this note.  If needed, that same information has been updated to reflect the current situation based on today's encounter.    Flu 2020 Shingles 2014 PNA UTD Tetanus 2010 Cologuard 2018 Prostate cancer screening and PSA options(with potential risks and benefits of testing vs not testing) were discussed along with recent recs/guidelines. He declined testing PSAat this point.  Advance directive-wife or son equally designated if patient were incapacitated.  Cognitive function addressed- see scanned forms- and if abnormal then additional documentation follows.   AF.  On warfarin. He has cards f/u yearly.  No chest pain.  No  adverse effect on Coumadin.  He doesn't usually have elevated pulse.    Hypertension:               Using medication without problems or lightheadedness: yes Chest pain with exertion: No  Edema: no Short of breath: at baseline, only with sig exertion, this is not new in the last year. Taking lasix about once a week.    Elevated Cholesterol: Using medications without problems: yes Muscle aches: no Diet compliance: encouraged.  Exercise: encouraged, he is motivated to get out in the yard.    Hypothyroidism.  No ADE on med.  Compliant.  No neck mass.    He scraped the top of his head on a tree limp.  Locally for and irritated, tender. No fevers.  Some local redness.    PMH and SH reviewed  Meds, vitals, and allergies reviewed.   ROS: Per HPI.  Unless specifically indicated otherwise in HPI, the patient denies:  General: fever. Eyes: acute vision changes ENT: sore throat Cardiovascular: chest pain Respiratory: SOB GI: vomiting GU: dysuria Musculoskeletal: acute back pain Derm: acute rash Neuro: acute motor dysfunction Psych: worsening mood Endocrine: polydipsia Heme: bleeding Allergy: hayfever  GEN: nad, alert and oriented HEENT: ncat NECK: supple w/o LA CV: IRR PULM: ctab, no inc wob ABD: soft, +bs EXT: no edema SKIN: irritated scrape on the crown of the scalp.  No fluctuant mass.   Health Maintenance  Topic Date Due  . INFLUENZA VACCINE  04/29/2019  . TETANUS/TDAP  07/31/2019  . Fecal DNA (Cologuard)  01/15/2020  . Hepatitis C Screening  Completed  . PNA vac Low Risk Adult  Completed   Cologuard ordered.  Discussed.

## 2019-12-18 NOTE — Patient Instructions (Addendum)
Check with your insurance to see if they will cover the shingrix and tetanus shots. Keep working on diet and exercise.  Update me as needed.  We'll get cologuard set up.   We'll update cardiology.  Take care.  Glad to see you. Start keflex and update me/dermatology if not getting better.

## 2019-12-20 DIAGNOSIS — L989 Disorder of the skin and subcutaneous tissue, unspecified: Secondary | ICD-10-CM | POA: Insufficient documentation

## 2019-12-20 NOTE — Assessment & Plan Note (Signed)
With irritation noted.  No fluctuant mass.  He should be able to take Keflex without that disrupting his other medications.  If he does not improve then want him to either follow-up here or with dermatology.  He agrees.

## 2019-12-20 NOTE — Assessment & Plan Note (Signed)
Flu 2020 Shingles 2014 PNA UTD Tetanus 2010 Cologuard 2018 Prostate cancer screening and PSA options(with potential risks and benefits of testing vs not testing) were discussed along with recent recs/guidelines. He declined testing PSAat this point.  Advance directive-wife or son equally designated if patient were incapacitated.  Cognitive function addressed- see scanned forms- and if abnormal then additional documentation follows.

## 2019-12-20 NOTE — Assessment & Plan Note (Signed)
Reasonable control.  Continue as is.  He agrees.

## 2019-12-20 NOTE — Assessment & Plan Note (Signed)
On warfarin. He has cards f/u yearly.  No chest pain.  No adverse effect on Coumadin.  He doesn't usually have elevated pulse.  Continue as is.  He agrees.

## 2019-12-20 NOTE — Assessment & Plan Note (Signed)
No ADE on med.  Compliant.  No neck mass.    TSH normal.  Continue levothyroxine.

## 2019-12-20 NOTE — Assessment & Plan Note (Signed)
Continue pravastatin.  Labs discussed with patient.  He agrees.  Diet and exercise discussed with patient.

## 2019-12-20 NOTE — Assessment & Plan Note (Signed)
Advance directive- wife or son equally designated if patient were incapacitated. 

## 2020-01-03 DIAGNOSIS — I4891 Unspecified atrial fibrillation: Secondary | ICD-10-CM | POA: Diagnosis not present

## 2020-01-03 DIAGNOSIS — E785 Hyperlipidemia, unspecified: Secondary | ICD-10-CM | POA: Diagnosis not present

## 2020-01-03 DIAGNOSIS — I10 Essential (primary) hypertension: Secondary | ICD-10-CM | POA: Diagnosis not present

## 2020-01-03 DIAGNOSIS — R69 Illness, unspecified: Secondary | ICD-10-CM | POA: Diagnosis not present

## 2020-01-03 DIAGNOSIS — E7800 Pure hypercholesterolemia, unspecified: Secondary | ICD-10-CM | POA: Diagnosis not present

## 2020-01-11 DIAGNOSIS — C44329 Squamous cell carcinoma of skin of other parts of face: Secondary | ICD-10-CM | POA: Diagnosis not present

## 2020-01-11 DIAGNOSIS — D485 Neoplasm of uncertain behavior of skin: Secondary | ICD-10-CM | POA: Diagnosis not present

## 2020-01-11 DIAGNOSIS — L821 Other seborrheic keratosis: Secondary | ICD-10-CM | POA: Diagnosis not present

## 2020-01-11 DIAGNOSIS — D2262 Melanocytic nevi of left upper limb, including shoulder: Secondary | ICD-10-CM | POA: Diagnosis not present

## 2020-01-11 DIAGNOSIS — D2261 Melanocytic nevi of right upper limb, including shoulder: Secondary | ICD-10-CM | POA: Diagnosis not present

## 2020-01-11 DIAGNOSIS — Z85828 Personal history of other malignant neoplasm of skin: Secondary | ICD-10-CM | POA: Diagnosis not present

## 2020-01-11 DIAGNOSIS — C44629 Squamous cell carcinoma of skin of left upper limb, including shoulder: Secondary | ICD-10-CM | POA: Diagnosis not present

## 2020-01-11 DIAGNOSIS — X32XXXA Exposure to sunlight, initial encounter: Secondary | ICD-10-CM | POA: Diagnosis not present

## 2020-01-11 DIAGNOSIS — C4442 Squamous cell carcinoma of skin of scalp and neck: Secondary | ICD-10-CM | POA: Diagnosis not present

## 2020-01-11 DIAGNOSIS — R208 Other disturbances of skin sensation: Secondary | ICD-10-CM | POA: Diagnosis not present

## 2020-01-11 DIAGNOSIS — D2272 Melanocytic nevi of left lower limb, including hip: Secondary | ICD-10-CM | POA: Diagnosis not present

## 2020-01-11 DIAGNOSIS — D2271 Melanocytic nevi of right lower limb, including hip: Secondary | ICD-10-CM | POA: Diagnosis not present

## 2020-01-11 DIAGNOSIS — L570 Actinic keratosis: Secondary | ICD-10-CM | POA: Diagnosis not present

## 2020-01-15 DIAGNOSIS — I4891 Unspecified atrial fibrillation: Secondary | ICD-10-CM | POA: Diagnosis not present

## 2020-01-25 DIAGNOSIS — C4442 Squamous cell carcinoma of skin of scalp and neck: Secondary | ICD-10-CM | POA: Diagnosis not present

## 2020-02-02 DIAGNOSIS — R208 Other disturbances of skin sensation: Secondary | ICD-10-CM | POA: Diagnosis not present

## 2020-02-02 DIAGNOSIS — D485 Neoplasm of uncertain behavior of skin: Secondary | ICD-10-CM | POA: Diagnosis not present

## 2020-02-02 DIAGNOSIS — C44329 Squamous cell carcinoma of skin of other parts of face: Secondary | ICD-10-CM | POA: Diagnosis not present

## 2020-02-02 DIAGNOSIS — C4441 Basal cell carcinoma of skin of scalp and neck: Secondary | ICD-10-CM | POA: Diagnosis not present

## 2020-02-09 DIAGNOSIS — C44629 Squamous cell carcinoma of skin of left upper limb, including shoulder: Secondary | ICD-10-CM | POA: Diagnosis not present

## 2020-02-09 DIAGNOSIS — D0462 Carcinoma in situ of skin of left upper limb, including shoulder: Secondary | ICD-10-CM | POA: Diagnosis not present

## 2020-02-09 DIAGNOSIS — Z1211 Encounter for screening for malignant neoplasm of colon: Secondary | ICD-10-CM | POA: Diagnosis not present

## 2020-02-09 LAB — COLOGUARD: Cologuard: NEGATIVE

## 2020-02-16 LAB — COLOGUARD: COLOGUARD: NEGATIVE

## 2020-02-19 ENCOUNTER — Encounter: Payer: Self-pay | Admitting: Family Medicine

## 2020-03-31 ENCOUNTER — Other Ambulatory Visit: Payer: Self-pay | Admitting: Family Medicine

## 2020-05-27 DIAGNOSIS — H52223 Regular astigmatism, bilateral: Secondary | ICD-10-CM | POA: Diagnosis not present

## 2020-05-27 DIAGNOSIS — H524 Presbyopia: Secondary | ICD-10-CM | POA: Diagnosis not present

## 2020-05-27 DIAGNOSIS — H5203 Hypermetropia, bilateral: Secondary | ICD-10-CM | POA: Diagnosis not present

## 2020-05-27 DIAGNOSIS — H35372 Puckering of macula, left eye: Secondary | ICD-10-CM | POA: Diagnosis not present

## 2020-05-27 DIAGNOSIS — H353131 Nonexudative age-related macular degeneration, bilateral, early dry stage: Secondary | ICD-10-CM | POA: Diagnosis not present

## 2020-05-27 DIAGNOSIS — H2513 Age-related nuclear cataract, bilateral: Secondary | ICD-10-CM | POA: Diagnosis not present

## 2020-06-20 DIAGNOSIS — R69 Illness, unspecified: Secondary | ICD-10-CM | POA: Diagnosis not present

## 2020-06-24 DIAGNOSIS — D2262 Melanocytic nevi of left upper limb, including shoulder: Secondary | ICD-10-CM | POA: Diagnosis not present

## 2020-06-24 DIAGNOSIS — D2271 Melanocytic nevi of right lower limb, including hip: Secondary | ICD-10-CM | POA: Diagnosis not present

## 2020-06-24 DIAGNOSIS — L570 Actinic keratosis: Secondary | ICD-10-CM | POA: Diagnosis not present

## 2020-06-24 DIAGNOSIS — X32XXXA Exposure to sunlight, initial encounter: Secondary | ICD-10-CM | POA: Diagnosis not present

## 2020-06-24 DIAGNOSIS — D485 Neoplasm of uncertain behavior of skin: Secondary | ICD-10-CM | POA: Diagnosis not present

## 2020-06-24 DIAGNOSIS — D044 Carcinoma in situ of skin of scalp and neck: Secondary | ICD-10-CM | POA: Diagnosis not present

## 2020-06-24 DIAGNOSIS — D225 Melanocytic nevi of trunk: Secondary | ICD-10-CM | POA: Diagnosis not present

## 2020-06-24 DIAGNOSIS — Z85828 Personal history of other malignant neoplasm of skin: Secondary | ICD-10-CM | POA: Diagnosis not present

## 2020-06-24 DIAGNOSIS — L821 Other seborrheic keratosis: Secondary | ICD-10-CM | POA: Diagnosis not present

## 2020-06-24 DIAGNOSIS — D2261 Melanocytic nevi of right upper limb, including shoulder: Secondary | ICD-10-CM | POA: Diagnosis not present

## 2020-06-24 DIAGNOSIS — D0461 Carcinoma in situ of skin of right upper limb, including shoulder: Secondary | ICD-10-CM | POA: Diagnosis not present

## 2020-07-04 DIAGNOSIS — I5022 Chronic systolic (congestive) heart failure: Secondary | ICD-10-CM | POA: Diagnosis not present

## 2020-07-04 DIAGNOSIS — I4891 Unspecified atrial fibrillation: Secondary | ICD-10-CM | POA: Diagnosis not present

## 2020-07-04 DIAGNOSIS — I10 Essential (primary) hypertension: Secondary | ICD-10-CM | POA: Diagnosis not present

## 2020-07-04 DIAGNOSIS — R69 Illness, unspecified: Secondary | ICD-10-CM | POA: Diagnosis not present

## 2020-08-01 DIAGNOSIS — D0461 Carcinoma in situ of skin of right upper limb, including shoulder: Secondary | ICD-10-CM | POA: Diagnosis not present

## 2020-08-15 DIAGNOSIS — D044 Carcinoma in situ of skin of scalp and neck: Secondary | ICD-10-CM | POA: Diagnosis not present

## 2020-11-30 ENCOUNTER — Other Ambulatory Visit: Payer: Self-pay | Admitting: Family Medicine

## 2020-11-30 DIAGNOSIS — I4891 Unspecified atrial fibrillation: Secondary | ICD-10-CM

## 2020-11-30 DIAGNOSIS — I10 Essential (primary) hypertension: Secondary | ICD-10-CM

## 2020-11-30 DIAGNOSIS — E039 Hypothyroidism, unspecified: Secondary | ICD-10-CM

## 2020-12-12 ENCOUNTER — Other Ambulatory Visit (INDEPENDENT_AMBULATORY_CARE_PROVIDER_SITE_OTHER): Payer: Medicare HMO

## 2020-12-12 ENCOUNTER — Other Ambulatory Visit: Payer: Self-pay

## 2020-12-12 DIAGNOSIS — I4891 Unspecified atrial fibrillation: Secondary | ICD-10-CM | POA: Diagnosis not present

## 2020-12-12 DIAGNOSIS — E039 Hypothyroidism, unspecified: Secondary | ICD-10-CM

## 2020-12-12 DIAGNOSIS — I10 Essential (primary) hypertension: Secondary | ICD-10-CM | POA: Diagnosis not present

## 2020-12-12 LAB — CBC WITH DIFFERENTIAL/PLATELET
Basophils Absolute: 0.1 10*3/uL (ref 0.0–0.1)
Basophils Relative: 0.8 % (ref 0.0–3.0)
Eosinophils Absolute: 0.3 10*3/uL (ref 0.0–0.7)
Eosinophils Relative: 3.6 % (ref 0.0–5.0)
HCT: 41.7 % (ref 39.0–52.0)
Hemoglobin: 14.3 g/dL (ref 13.0–17.0)
Lymphocytes Relative: 23.8 % (ref 12.0–46.0)
Lymphs Abs: 1.7 10*3/uL (ref 0.7–4.0)
MCHC: 34.3 g/dL (ref 30.0–36.0)
MCV: 89.1 fl (ref 78.0–100.0)
Monocytes Absolute: 0.6 10*3/uL (ref 0.1–1.0)
Monocytes Relative: 7.9 % (ref 3.0–12.0)
Neutro Abs: 4.5 10*3/uL (ref 1.4–7.7)
Neutrophils Relative %: 63.9 % (ref 43.0–77.0)
Platelets: 130 10*3/uL — ABNORMAL LOW (ref 150.0–400.0)
RBC: 4.68 Mil/uL (ref 4.22–5.81)
RDW: 13.8 % (ref 11.5–15.5)
WBC: 7.1 10*3/uL (ref 4.0–10.5)

## 2020-12-12 LAB — COMPREHENSIVE METABOLIC PANEL
ALT: 21 U/L (ref 0–53)
AST: 16 U/L (ref 0–37)
Albumin: 4.2 g/dL (ref 3.5–5.2)
Alkaline Phosphatase: 49 U/L (ref 39–117)
BUN: 21 mg/dL (ref 6–23)
CO2: 29 mEq/L (ref 19–32)
Calcium: 9.1 mg/dL (ref 8.4–10.5)
Chloride: 102 mEq/L (ref 96–112)
Creatinine, Ser: 1.26 mg/dL (ref 0.40–1.50)
GFR: 56.27 mL/min — ABNORMAL LOW (ref >60.00)
Glucose, Bld: 112 mg/dL — ABNORMAL HIGH (ref 70–99)
Potassium: 4.5 mEq/L (ref 3.5–5.1)
Sodium: 136 mEq/L (ref 135–145)
Total Bilirubin: 1.4 mg/dL — ABNORMAL HIGH (ref 0.2–1.2)
Total Protein: 6.8 g/dL (ref 6.0–8.3)

## 2020-12-12 LAB — LIPID PANEL
Cholesterol: 168 mg/dL (ref 0–200)
HDL: 39.5 mg/dL (ref >39.00)
LDL Cholesterol: 102 mg/dL — ABNORMAL HIGH (ref 0–99)
NonHDL: 128.66
Total CHOL/HDL Ratio: 4
Triglycerides: 132 mg/dL (ref 0.0–149.0)
VLDL: 26.4 mg/dL (ref 0.0–40.0)

## 2020-12-12 LAB — TSH: TSH: 3.25 u[IU]/mL (ref 0.35–4.50)

## 2020-12-13 LAB — DIGOXIN LEVEL: Digoxin Level: 0.7 mcg/L — ABNORMAL LOW (ref 0.8–2.0)

## 2020-12-19 ENCOUNTER — Other Ambulatory Visit: Payer: Self-pay

## 2020-12-19 ENCOUNTER — Encounter: Payer: Self-pay | Admitting: Family Medicine

## 2020-12-19 ENCOUNTER — Ambulatory Visit (INDEPENDENT_AMBULATORY_CARE_PROVIDER_SITE_OTHER): Payer: Medicare HMO | Admitting: Family Medicine

## 2020-12-19 VITALS — BP 118/64 | HR 76 | Temp 97.5°F | Ht 70.0 in | Wt 195.0 lb

## 2020-12-19 DIAGNOSIS — E039 Hypothyroidism, unspecified: Secondary | ICD-10-CM | POA: Diagnosis not present

## 2020-12-19 DIAGNOSIS — I509 Heart failure, unspecified: Secondary | ICD-10-CM

## 2020-12-19 DIAGNOSIS — E7800 Pure hypercholesterolemia, unspecified: Secondary | ICD-10-CM | POA: Diagnosis not present

## 2020-12-19 DIAGNOSIS — I4891 Unspecified atrial fibrillation: Secondary | ICD-10-CM | POA: Diagnosis not present

## 2020-12-19 DIAGNOSIS — Z0000 Encounter for general adult medical examination without abnormal findings: Secondary | ICD-10-CM | POA: Diagnosis not present

## 2020-12-19 DIAGNOSIS — Z7189 Other specified counseling: Secondary | ICD-10-CM

## 2020-12-19 MED ORDER — LEVOTHYROXINE SODIUM 137 MCG PO TABS: ORAL_TABLET | ORAL | 3 refills | Status: DC

## 2020-12-19 MED ORDER — DOXAZOSIN MESYLATE 4 MG PO TABS: 4.0000 mg | ORAL_TABLET | Freq: Every day | ORAL | 3 refills | Status: DC

## 2020-12-19 MED ORDER — ALBUTEROL SULFATE HFA 108 (90 BASE) MCG/ACT IN AERS: 1.0000 | INHALATION_SPRAY | Freq: Four times a day (QID) | RESPIRATORY_TRACT | 1 refills | Status: DC | PRN

## 2020-12-19 MED ORDER — PRAVASTATIN SODIUM 40 MG PO TABS: 40.0000 mg | ORAL_TABLET | Freq: Every day | ORAL | 3 refills | Status: DC

## 2020-12-19 NOTE — Patient Instructions (Signed)
Don't change your meds for now.  Thanks for your effort.   Update me as needed.  I'll update cardiology.  Take care.  Glad to see you.

## 2020-12-19 NOTE — Progress Notes (Signed)
This visit occurred during the SARS-CoV-2 public health emergency.  Safety protocols were in place, including screening questions prior to the visit, additional usage of staff PPE, and extensive cleaning of exam room while observing appropriate contact time as indicated for disinfecting solutions.  I have personally reviewed the Medicare Annual Wellness questionnaire and have noted 1. The patient's medical and social history 2. Their use of alcohol, tobacco or illicit drugs 3. Their current medications and supplements 4. The patient's functional ability including ADL's, fall risks, home safety risks and hearing or visual             impairment. 5. Diet and physical activities 6. Evidence for depression or mood disorders  The patients weight, height, BMI have been recorded in the chart and visual acuity is per eye clinic.  I have made referrals, counseling and provided education to the patient based review of the above and I have provided the pt with a written personalized care plan for preventive services.  Provider list updated- see scanned forms.  Routine anticipatory guidance given to patient.  See health maintenance. The possibility exists that previously documented standard health maintenance information may have been brought forward from a previous encounter into this note.  If needed, that same information has been updated to reflect the current situation based on today's encounter.    Flu 2021 Shingles 2021 PNA up-to-date Tetanus 2021 COVID vaccine up-to-date Cologuard 2021 Prostate cancer screening and PSA options (with potential risks and benefits of testing vs not testing) were discussed along with recent recs/guidelines.  He declined testing PSA at this point. Advance directive-wife and son both equally designated if patient were incapacitated. Cognitive function addressed- see scanned forms- and if abnormal then additional documentation follows.   CHF.  Lasix used about once  every 1-2 weeks.    Using medication without problems or lightheadedness: yes Chest pain with exertion:no Edema: none since starting spironolactone years ago.   Short of breath: only if sig exertion (cutting wood, pushing a loaded wheelbarrow up a hill), not acute worse.   Average home BPs: no  History of atrial fibrillation.  Anticoagulated, no bleeding except for occ hemorrhoids.  Low platelets at baseline.  No heart racing.    Elevated Cholesterol: Using medications without problems: yes Muscle aches: no  Diet compliance: yes  Exercise: yes   Hypothyroidism.  Still on replacement at baseline.  TSH wnl.  No ADE on med.  No neck mass.     Wife with memory loss. D/w pt.  Support offered.    PMH and SH reviewed  Meds, vitals, and allergies reviewed.   ROS: Per HPI.  Unless specifically indicated otherwise in HPI, the patient denies:  General: fever. Eyes: acute vision changes ENT: sore throat Cardiovascular: chest pain Respiratory: SOB GI: vomiting GU: dysuria Musculoskeletal: acute back pain Derm: acute rash Neuro: acute motor dysfunction Psych: worsening mood Endocrine: polydipsia Heme: bleeding Allergy: hayfever  GEN: nad, alert and oriented HEENT: NCAT NECK: supple w/o LA CV: IRR, not tachycardic PULM: ctab, no inc wob ABD: soft, +bs EXT: no edema SKIN: Well-perfused.

## 2020-12-22 NOTE — Assessment & Plan Note (Signed)
TSH normal ?- Continue levothyroxine ?

## 2020-12-22 NOTE — Assessment & Plan Note (Signed)
We will update cardiology regarding his labs.  No change in medications at this point.  Only needing Lasix episodically.  Continue Eliquis Coreg digoxin doxazosin Entresto Lasix and spironolactone.  Continue statin.

## 2020-12-22 NOTE — Assessment & Plan Note (Signed)
Continue Eliquis.  CBC discussed with patient.

## 2020-12-22 NOTE — Assessment & Plan Note (Signed)
Flu 2021 Shingles 2021 PNA up-to-date Tetanus 2021 COVID vaccine up-to-date Cologuard 2021 Prostate cancer screening and PSA options (with potential risks and benefits of testing vs not testing) were discussed along with recent recs/guidelines.  He declined testing PSA at this point. Advance directive-wife and son both equally designated if patient were incapacitated. Cognitive function addressed- see scanned forms- and if abnormal then additional documentation follows.

## 2020-12-22 NOTE — Assessment & Plan Note (Signed)
Advance directive-wife and son both equally designated if patient were incapacitated.

## 2020-12-22 NOTE — Assessment & Plan Note (Signed)
Continue pravastatin.  Continue work on diet and exercise. 

## 2020-12-30 DIAGNOSIS — E7800 Pure hypercholesterolemia, unspecified: Secondary | ICD-10-CM | POA: Diagnosis not present

## 2020-12-30 DIAGNOSIS — I5022 Chronic systolic (congestive) heart failure: Secondary | ICD-10-CM | POA: Diagnosis not present

## 2020-12-30 DIAGNOSIS — J449 Chronic obstructive pulmonary disease, unspecified: Secondary | ICD-10-CM | POA: Diagnosis not present

## 2020-12-30 DIAGNOSIS — I4891 Unspecified atrial fibrillation: Secondary | ICD-10-CM | POA: Diagnosis not present

## 2020-12-30 DIAGNOSIS — R69 Illness, unspecified: Secondary | ICD-10-CM | POA: Diagnosis not present

## 2020-12-30 DIAGNOSIS — E785 Hyperlipidemia, unspecified: Secondary | ICD-10-CM | POA: Diagnosis not present

## 2020-12-30 DIAGNOSIS — R0602 Shortness of breath: Secondary | ICD-10-CM | POA: Diagnosis not present

## 2020-12-31 DIAGNOSIS — L820 Inflamed seborrheic keratosis: Secondary | ICD-10-CM | POA: Diagnosis not present

## 2020-12-31 DIAGNOSIS — Z85828 Personal history of other malignant neoplasm of skin: Secondary | ICD-10-CM | POA: Diagnosis not present

## 2020-12-31 DIAGNOSIS — C44219 Basal cell carcinoma of skin of left ear and external auricular canal: Secondary | ICD-10-CM | POA: Diagnosis not present

## 2020-12-31 DIAGNOSIS — L570 Actinic keratosis: Secondary | ICD-10-CM | POA: Diagnosis not present

## 2020-12-31 DIAGNOSIS — X32XXXA Exposure to sunlight, initial encounter: Secondary | ICD-10-CM | POA: Diagnosis not present

## 2020-12-31 DIAGNOSIS — C44519 Basal cell carcinoma of skin of other part of trunk: Secondary | ICD-10-CM | POA: Diagnosis not present

## 2020-12-31 DIAGNOSIS — D2261 Melanocytic nevi of right upper limb, including shoulder: Secondary | ICD-10-CM | POA: Diagnosis not present

## 2020-12-31 DIAGNOSIS — D485 Neoplasm of uncertain behavior of skin: Secondary | ICD-10-CM | POA: Diagnosis not present

## 2020-12-31 DIAGNOSIS — D2262 Melanocytic nevi of left upper limb, including shoulder: Secondary | ICD-10-CM | POA: Diagnosis not present

## 2020-12-31 DIAGNOSIS — L538 Other specified erythematous conditions: Secondary | ICD-10-CM | POA: Diagnosis not present

## 2020-12-31 DIAGNOSIS — D2272 Melanocytic nevi of left lower limb, including hip: Secondary | ICD-10-CM | POA: Diagnosis not present

## 2021-02-05 DIAGNOSIS — C44219 Basal cell carcinoma of skin of left ear and external auricular canal: Secondary | ICD-10-CM | POA: Diagnosis not present

## 2021-02-12 DIAGNOSIS — C44519 Basal cell carcinoma of skin of other part of trunk: Secondary | ICD-10-CM | POA: Diagnosis not present

## 2021-03-29 ENCOUNTER — Other Ambulatory Visit: Payer: Self-pay | Admitting: Family Medicine

## 2021-08-12 DIAGNOSIS — I5022 Chronic systolic (congestive) heart failure: Secondary | ICD-10-CM | POA: Diagnosis not present

## 2021-08-12 DIAGNOSIS — R69 Illness, unspecified: Secondary | ICD-10-CM | POA: Diagnosis not present

## 2021-08-12 DIAGNOSIS — I10 Essential (primary) hypertension: Secondary | ICD-10-CM | POA: Diagnosis not present

## 2021-08-12 DIAGNOSIS — N522 Drug-induced erectile dysfunction: Secondary | ICD-10-CM | POA: Diagnosis not present

## 2021-08-12 DIAGNOSIS — E785 Hyperlipidemia, unspecified: Secondary | ICD-10-CM | POA: Diagnosis not present

## 2021-08-12 DIAGNOSIS — I4891 Unspecified atrial fibrillation: Secondary | ICD-10-CM | POA: Diagnosis not present

## 2021-09-09 DIAGNOSIS — H52223 Regular astigmatism, bilateral: Secondary | ICD-10-CM | POA: Diagnosis not present

## 2021-09-09 DIAGNOSIS — H5203 Hypermetropia, bilateral: Secondary | ICD-10-CM | POA: Diagnosis not present

## 2021-09-09 DIAGNOSIS — H353131 Nonexudative age-related macular degeneration, bilateral, early dry stage: Secondary | ICD-10-CM | POA: Diagnosis not present

## 2021-09-09 DIAGNOSIS — H524 Presbyopia: Secondary | ICD-10-CM | POA: Diagnosis not present

## 2021-09-09 DIAGNOSIS — H35372 Puckering of macula, left eye: Secondary | ICD-10-CM | POA: Diagnosis not present

## 2021-09-09 DIAGNOSIS — H2513 Age-related nuclear cataract, bilateral: Secondary | ICD-10-CM | POA: Diagnosis not present

## 2021-09-17 ENCOUNTER — Encounter: Payer: Self-pay | Admitting: Family Medicine

## 2021-10-01 ENCOUNTER — Encounter: Payer: Self-pay | Admitting: Family

## 2021-10-01 ENCOUNTER — Ambulatory Visit (INDEPENDENT_AMBULATORY_CARE_PROVIDER_SITE_OTHER): Payer: Medicare HMO | Admitting: Family

## 2021-10-01 ENCOUNTER — Other Ambulatory Visit: Payer: Self-pay

## 2021-10-01 ENCOUNTER — Ambulatory Visit (INDEPENDENT_AMBULATORY_CARE_PROVIDER_SITE_OTHER): Payer: Medicare HMO

## 2021-10-01 VITALS — BP 112/68 | HR 126 | Temp 96.5°F | Ht 70.0 in | Wt 176.0 lb

## 2021-10-01 DIAGNOSIS — J209 Acute bronchitis, unspecified: Secondary | ICD-10-CM | POA: Diagnosis not present

## 2021-10-01 DIAGNOSIS — R062 Wheezing: Secondary | ICD-10-CM

## 2021-10-01 DIAGNOSIS — I517 Cardiomegaly: Secondary | ICD-10-CM | POA: Diagnosis not present

## 2021-10-01 MED ORDER — AMOXICILLIN-POT CLAVULANATE 875-125 MG PO TABS: 1.0000 | ORAL_TABLET | Freq: Two times a day (BID) | ORAL | 0 refills | Status: DC

## 2021-10-01 MED ORDER — PREDNISONE 20 MG PO TABS: ORAL_TABLET | ORAL | 0 refills | Status: DC

## 2021-10-01 MED ORDER — DOXYCYCLINE HYCLATE 100 MG PO TABS: 100.0000 mg | ORAL_TABLET | Freq: Two times a day (BID) | ORAL | 0 refills | Status: AC

## 2021-10-01 NOTE — Progress Notes (Signed)
Established Patient Office Visit  Subjective:  Patient ID: [name] [name], male    DOB: 11/02/1945  Age: 76 y.o. MRN: [id number]  CC:  Chief Complaint  Patient presents with   Nasal Congestion    HPI [name] [name] is here today with c/o three week h/o Upper respiratory symptoms. Started as a sore throat, which has resolved, and has now since traveled to his chest. He today has c/o chest congestion, rhinorrhea, nasal congestion, some wheezing and sob. He is using albuterol inhaler 1-2 times daily.   Mucinex twice daily and inhaler Prn otc. No fever and or chills.   Past Medical History:  Diagnosis Date   Atrial fibrillation (HCC)    CHF (congestive heart failure) ([place])    Cirrhosis (HCC)    ED (erectile dysfunction)    Hyperglycemia    Hyperlipidemia    Hypertension    Internal hemorrhoids    SCCA (squamous cell carcinoma) of skin    Left ear.  MOHS surgery 2014 [facility]   Skin cancer of face    2012 basal cell    Past Surgical History:  Procedure Laterality Date   A-Fib Cardiomyopathy [facility])  10/2006   EF 25 - 35%   DOPPLER ECHOCARDIOGRAPHY  10/31/2006   EF 25-35% Mod. M.R., mild -mod TR    Family History  Problem Relation Age of Onset   Hypertension Mother    Cancer Mother        Bilat. 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[employer]   Occupation: Semi-retired  Tobacco Use   Smoking status: Never   Smokeless tobacco:  Never  Substance and Sexual Activity   Alcohol use: No    Comment: h/o abuse, quit 2008, rare use as of 2014, none since 09/2014   Drug use: No   Sexual activity: Not on file  Other Topics Concern   Not on file  Social History Narrative   Left handed   2 sons initially, 1 died 28-Aug-2009 from [place], his other son is local   From [place], [place]   Works some at [employer] as of 2022   Enjoys working in the yard   Married 1967   Caffeine daily      Wife with memory loss.    Social Determinants of Health   Financial Resource Strain: Not on file  Food Insecurity: Not on file  Transportation Needs: Not on file  Physical Activity: Not on file  Stress: Not on file  Social Connections: Not on file  Intimate Partner Violence: Not on file    Outpatient Medications Prior to Visit  Medication Sig Dispense Refill   albuterol (PROAIR HFA) 108 (90 Base) MCG/ACT inhaler Inhale 1-2 puffs into the lungs every 6 (six) hours as needed  for wheezing or shortness of breath. Fill with proair/albuterol/ventolin 1 each 1   apixaban (ELIQUIS) 5 MG TABS tablet Take by mouth.     carvedilol (COREG) 25 MG tablet Take 25 mg by mouth 2 (two) times daily with a meal.     digoxin (LANOXIN) 0.125 MG tablet Take 0.125 mg by mouth daily.     doxazosin (CARDURA) 4 MG tablet Take 1 tablet (4 mg total) by mouth at bedtime. 90 tablet 3   ENTRESTO 24-26 MG Take 1 tablet by mouth 2 (two) times daily.     fish oil-omega-3 fatty acids 1000 MG capsule Take 1 g by mouth daily.     furosemide (LASIX) 20 MG tablet Take 2 tablets (40 mg total) by mouth daily as needed.     levothyroxine (SYNTHROID) 137 MCG tablet TAKE 1 TABLET BY MOUTH ONCE DAILY BEFORE BREAKFAST 90 tablet 3   Melatonin 5 MG CAPS Take 5 mg by mouth daily.      Multiple Vitamin (MULTIVITAMIN) tablet Take 1 tablet by mouth daily.     pravastatin (PRAVACHOL) 40 MG tablet Take 1 tablet by mouth once daily 90 tablet 0   spironolactone (ALDACTONE) 25 MG  tablet TAKE 1 TABLET BY MOUTH ONCE DAILY     No facility-administered medications prior to visit.    No Known Allergies  ROS Review of Systems  Constitutional:  Negative for chills and fever.  HENT:  Positive for congestion. Negative for ear pain, sinus pain and sore throat.   Respiratory:  Positive for cough (wet productive cough with green/yellow productive), shortness of breath (green/yellow) and wheezing (using albuterol inhaler PRN, but finding using it in the am and sometimes at night).   Cardiovascular:  Negative for chest pain and palpitations.     Objective:    Physical Exam Constitutional:      General: He is awake. He is not in acute distress.    Appearance: Normal appearance. He is normal weight. He is not ill-appearing.  HENT:     Right Ear: Tympanic membrane normal.     Left Ear: Ear canal normal.  No middle ear effusion. Tympanic membrane is erythematous (mild erythema). Tympanic membrane is not bulging.     Nose: Nose normal.     Right Turbinates: Not enlarged or swollen.     Left Turbinates: Not enlarged or swollen.     Right Sinus: No maxillary sinus tenderness or frontal sinus tenderness.     Left Sinus: No maxillary sinus tenderness or frontal sinus tenderness.     Mouth/Throat:     Mouth: Mucous membranes are moist.     Pharynx: No pharyngeal swelling, oropharyngeal exudate or posterior oropharyngeal erythema.  Eyes:     Extraocular Movements: Extraocular movements intact.     Pupils: Pupils are equal, round, and reactive to light.  Cardiovascular:     Rate and Rhythm: Normal rate and regular rhythm.  Pulmonary:     Effort: Pulmonary effort is normal. No respiratory distress.     Breath sounds: No stridor. Wheezing and rhonchi present.     Comments: Decreased breath sounds all quadrants  Chest:     Chest wall: No tenderness.  Neurological:     Mental Status: He is alert.    BP 112/68    Pulse (!) 126    Temp (!) 96.5 F (35.8 C) (Temporal)    Ht  5\' 10"  (1.778 m)    Wt 176 lb (79.8 kg)    SpO2 96%  BMI 25.25 kg/m  Wt Readings from Last 3 Encounters:  10/01/21 176 lb (79.8 kg)  12/19/20 195 lb (88.5 kg)  12/18/19 197 lb 1 oz (89.4 kg)     Health Maintenance Due  Topic Date Due   Zoster Vaccines- Shingrix (1 of 2) Never done   TETANUS/TDAP  07/31/2019   COVID-19 Vaccine (3 - Pfizer risk series) 01/02/2020   INFLUENZA VACCINE  04/28/2021    There are no preventive care reminders to display for this patient.  Lab Results  Component Value Date   TSH 3.25 12/12/2020   Lab Results  Component Value Date   WBC 7.1 12/12/2020   HGB 14.3 12/12/2020   HCT 41.7 12/12/2020   MCV 89.1 12/12/2020   PLT 130.0 (L) 12/12/2020   Lab Results  Component Value Date   NA 136 12/12/2020   K 4.5 12/12/2020   CO2 29 12/12/2020   GLUCOSE 112 (H) 12/12/2020   BUN 21 12/12/2020   CREATININE 1.26 12/12/2020   BILITOT 1.4 (H) 12/12/2020   ALKPHOS 49 12/12/2020   AST 16 12/12/2020   ALT 21 12/12/2020   PROT 6.8 12/12/2020   ALBUMIN 4.2 12/12/2020   CALCIUM 9.1 12/12/2020   GFR 56.27 (L) 12/12/2020   No results found for: HGBA1C    Assessment & Plan:   Problem List Items Addressed This Visit   None   No orders of the defined types were placed in this encounter.   Follow-up: No follow-ups on file.    Eugenia Pancoast, FNP

## 2021-10-01 NOTE — Patient Instructions (Signed)
Antibiotic sent to preferred pharmacy. If any worsening shortness of breath/ harder to breath please go to the ER and or call 911.   Please increase oral fluids, steamy hot shower/humidifier prn.  Please follow up if no improvement in 2-3 days.   It was a pleasure seeing you today! Please do not hesitate to reach out with any questions and or concerns.  Regards,   Eugenia Pancoast

## 2021-10-01 NOTE — Assessment & Plan Note (Signed)
ddx suspected: pneumonia, ordered CXR stat pending results.  RX sent for doxycycline 100 mg po bid x 10 days as well as prednisone 40 mg po x 2 days then 20 mg po x 3 days. If with worsening sob /hard to breathe /decreasing o2 sat pt to go to er and or call 911.

## 2021-10-10 ENCOUNTER — Ambulatory Visit (INDEPENDENT_AMBULATORY_CARE_PROVIDER_SITE_OTHER): Payer: Medicare HMO

## 2021-10-10 ENCOUNTER — Encounter: Payer: Self-pay | Admitting: Family

## 2021-10-10 ENCOUNTER — Other Ambulatory Visit: Payer: Self-pay

## 2021-10-10 ENCOUNTER — Ambulatory Visit (INDEPENDENT_AMBULATORY_CARE_PROVIDER_SITE_OTHER): Payer: Medicare HMO | Admitting: Family

## 2021-10-10 VITALS — BP 102/64 | HR 56 | Temp 97.0°F | Ht 70.0 in | Wt 174.0 lb

## 2021-10-10 DIAGNOSIS — J180 Bronchopneumonia, unspecified organism: Secondary | ICD-10-CM | POA: Insufficient documentation

## 2021-10-10 DIAGNOSIS — I517 Cardiomegaly: Secondary | ICD-10-CM

## 2021-10-10 DIAGNOSIS — J189 Pneumonia, unspecified organism: Secondary | ICD-10-CM | POA: Diagnosis not present

## 2021-10-10 NOTE — Assessment & Plan Note (Signed)
Stable, patient to continue follow-up with cardiology as scheduled.

## 2021-10-10 NOTE — Patient Instructions (Signed)
Complete xray(s) prior to leaving today as we are repeating your xray for pneumonia. I will notify you of your results once received.  Please let me know if any symptoms start to worsen rather than improve. Continue duration of therapy for antibiotics.   It was a pleasure seeing you today! Please do not hesitate to reach out with any questions and or concerns.  Regards,   Eugenia Pancoast FNP-C

## 2021-10-10 NOTE — Assessment & Plan Note (Signed)
Repeat chest x-ray today.  Patient to continue duration of therapy as he has 1 dose left.  Advised patient if any worsening symptoms to immediately notify me otherwise, however at this point symptoms seem to be improving.

## 2021-10-10 NOTE — Progress Notes (Signed)
Established Patient Office Visit  Subjective:  Patient ID: [name] [name], male    DOB: 17-Aug-1946  Age: 76 y.o. MRN: [id number]  CC:  Chief Complaint  Patient presents with   Follow-up    HPI [name] [name] is here today for f/u   1/4 pt seen with me for 3 week h/o URI symptoms. CXR showed middle lobe opacities susp for developing bronchopneumonia. Noted cardiomegaly as well. Sent rx for doxycycyline 100 mg po bid x 10 days as well as augmentin 875/125 mg po bid x 10 days. Pt did start taking with probiotics which was helpful for his stomach to complete the duration of therapy. Has completed duration of prednisone therapy as well.   The last two nights has noted cough is improving and he has not had to take his cough medication prior to going to bed and hasn't had to use inhaler also. Coughing spells much improved, still with some rhinorrhea and nasal congestion but improving. He does have occasional chest tightness/sob but rare now in the past two days.   Cardiomegaly seen on cxr: pt closely followed by cardiologist. Next appt in April.   Past Medical History:  Diagnosis Date   Atrial fibrillation (HCC)    CHF (congestive heart failure) ([place])    Cirrhosis (HCC)    ED (erectile dysfunction)    Hyperglycemia    Hyperlipidemia    Hypertension    Internal hemorrhoids    SCCA (squamous cell carcinoma) of skin    Left ear.  MOHS surgery 2014 [facility]   Skin cancer of face    2012 basal cell    Past Surgical History:  Procedure Laterality Date   A-Fib Cardiomyopathy [facility])  10/2006   EF 25 - 35%   DOPPLER ECHOCARDIOGRAPHY  10/31/2006   EF 25-35% Mod. M.R., mild -mod TR    Family History  Problem Relation Age of Onset   Hypertension Mother    Cancer Mother        Bilat. 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    Social Determinants of Health   Financial Resource Strain: Not on file  Food Insecurity: Not on file  Transportation Needs: Not on file  Physical Activity: Not on file  Stress: Not on file  Social Connections: Not on file  Intimate Partner Violence: Not on file    Outpatient Medications Prior to Visit  Medication Sig Dispense Refill   albuterol (PROAIR HFA) 108 (90 Base) MCG/ACT inhaler Inhale 1-2 puffs into the lungs every 6 (six) hours as needed for wheezing or shortness of breath. Fill with proair/albuterol/ventolin 1 each 1   amoxicillin-clavulanate (AUGMENTIN) 875-125 MG tablet Take 1 tablet by mouth 2 (two) times daily. 20 tablet 0   apixaban (ELIQUIS) 5 MG TABS tablet Take by mouth.     carvedilol (COREG) 25 MG tablet Take 25 mg by mouth 2 (two) times daily with a meal.     digoxin (LANOXIN) 0.125 MG tablet Take 0.125 mg by mouth daily.     doxazosin (CARDURA) 4 MG tablet Take 1 tablet (4 mg total) by mouth at bedtime. 90  tablet 3   doxycycline (VIBRA-TABS) 100 MG tablet Take 1 tablet (100 mg total) by mouth 2 (two) times daily for 10 days. 20 tablet 0   ENTRESTO 24-26 MG Take 1 tablet by mouth 2 (two) times daily.     fish oil-omega-3 fatty acids 1000 MG capsule Take 1 g by mouth daily.     furosemide (LASIX) 20 MG tablet Take 2 tablets (40 mg total) by mouth daily as needed.     levothyroxine (SYNTHROID) 137 MCG tablet TAKE 1 TABLET BY MOUTH ONCE DAILY BEFORE BREAKFAST 90 tablet 3   Melatonin 5 MG CAPS Take 5 mg by mouth daily.      Multiple Vitamin (MULTIVITAMIN) tablet Take 1 tablet by mouth daily.     pravastatin (PRAVACHOL) 40 MG tablet Take 1 tablet by mouth once daily 90 tablet 0   predniSONE (DELTASONE) 20 MG tablet Take two tablets (total 40 mg) po qd for two days, then take one tablet po qd for three days (total 20 mg qd) 7 tablet 0   spironolactone (ALDACTONE) 25 MG tablet TAKE 1 TABLET BY MOUTH ONCE DAILY     No facility-administered medications prior to visit.    No Known Allergies  ROS Review of Systems  Constitutional:  Negative for chills and fever.  HENT:  Positive for congestion (improving). Negative for ear pain, sinus pain and sore throat.   Respiratory:  Positive for cough (wet productive improving with occsaional chest congesiton) and shortness of breath (improving). Negative for wheezing.   Cardiovascular:  Negative for chest pain and palpitations.     Objective:    Physical Exam Constitutional:      General: He is awake. He is not in acute distress.    Appearance: Normal appearance. He is not ill-appearing.  HENT:     Right Ear: Tympanic membrane normal.     Left Ear: Tympanic membrane normal.     Nose: Nose normal.     Right Turbinates: Not enlarged or swollen.     Left Turbinates: Not enlarged or swollen.     Right Sinus: No maxillary sinus tenderness or frontal sinus tenderness.     Left Sinus: No maxillary sinus tenderness or frontal sinus tenderness.      Mouth/Throat:     Mouth: Mucous membranes are moist.     Pharynx: No pharyngeal swelling, oropharyngeal exudate or posterior oropharyngeal erythema.  Eyes:     Extraocular Movements: Extraocular movements intact.     Pupils: Pupils are equal, round, and reactive to light.  Cardiovascular:     Rate and Rhythm: Normal rate. Rhythm irregularly irregular.     Heart sounds: S1 normal and S2 normal.     Comments: Afib rhythm, normal for him Pulmonary:     Effort: Pulmonary effort is normal.     Breath sounds: Normal breath sounds. No wheezing, rhonchi or rales.  Neurological:     Mental Status: He is alert.    BP 102/64    Pulse (!) 56    Temp (!) 97 F (36.1 C) (Temporal)    Ht 5\' 10"  (1.778 m)    Wt 174 lb (78.9 kg)    SpO2 95%    BMI 24.97 kg/m  Wt Readings from Last 3 Encounters:  10/10/21 174 lb (78.9 kg)  10/01/21 176 lb (79.8 kg)  12/19/20 195 lb (88.5 kg)     Health Maintenance Due  Topic Date Due   Zoster Vaccines- Shingrix (1 of 2) Never done   TETANUS/TDAP  07/31/2019   COVID-19 Vaccine (3 - Pfizer risk series) 01/02/2020   INFLUENZA VACCINE  04/28/2021    There are no preventive care reminders to display for this patient.  Lab Results  Component Value Date   TSH 3.25 12/12/2020   Lab Results  Component Value Date   WBC 7.1 12/12/2020   HGB 14.3 12/12/2020   HCT 41.7 12/12/2020   MCV 89.1 12/12/2020   PLT 130.0 (L) 12/12/2020   Lab Results  Component Value Date   NA 136 12/12/2020   K 4.5 12/12/2020   CO2 29 12/12/2020   GLUCOSE 112 (H) 12/12/2020   BUN 21 12/12/2020   CREATININE 1.26 12/12/2020   BILITOT 1.4 (H) 12/12/2020   ALKPHOS 49 12/12/2020   AST 16 12/12/2020   ALT 21 12/12/2020   PROT 6.8 12/12/2020   ALBUMIN 4.2 12/12/2020   CALCIUM 9.1 12/12/2020   GFR 56.27 (L) 12/12/2020   No results found for: HGBA1C    Assessment & Plan:   Problem List Items Addressed This Visit       Cardiovascular and Mediastinum   Cardiomegaly     Stable, patient to continue follow-up with cardiology as scheduled.        Respiratory   Bronchopneumonia - Primary    Repeat chest x-ray today.  Patient to continue duration of therapy as he has 1 dose left.  Advised patient if any worsening symptoms to immediately notify me otherwise, however at this point symptoms seem to be improving.      Relevant Orders   DG Chest 2 View    No orders of the defined types were placed in this encounter.   Follow-up: Return in about 3 months (around 12/29/2021), or if symptoms worsen or fail to improve, for with primary care for annual exam.    Eugenia Pancoast, FNP

## 2021-10-30 ENCOUNTER — Other Ambulatory Visit: Payer: Self-pay

## 2021-10-30 ENCOUNTER — Encounter: Payer: Self-pay | Admitting: Family

## 2021-10-30 ENCOUNTER — Ambulatory Visit (INDEPENDENT_AMBULATORY_CARE_PROVIDER_SITE_OTHER): Payer: Medicare HMO | Admitting: Family

## 2021-10-30 ENCOUNTER — Ambulatory Visit (INDEPENDENT_AMBULATORY_CARE_PROVIDER_SITE_OTHER): Payer: Medicare HMO

## 2021-10-30 VITALS — BP 126/78 | HR 74 | Temp 96.5°F | Ht 70.0 in | Wt 176.0 lb

## 2021-10-30 DIAGNOSIS — J811 Chronic pulmonary edema: Secondary | ICD-10-CM | POA: Diagnosis not present

## 2021-10-30 DIAGNOSIS — R052 Subacute cough: Secondary | ICD-10-CM

## 2021-10-30 DIAGNOSIS — R062 Wheezing: Secondary | ICD-10-CM | POA: Diagnosis not present

## 2021-10-30 DIAGNOSIS — I517 Cardiomegaly: Secondary | ICD-10-CM | POA: Diagnosis not present

## 2021-10-30 DIAGNOSIS — R0989 Other specified symptoms and signs involving the circulatory and respiratory systems: Secondary | ICD-10-CM

## 2021-10-30 DIAGNOSIS — R059 Cough, unspecified: Secondary | ICD-10-CM | POA: Diagnosis not present

## 2021-10-30 LAB — CBC WITH DIFFERENTIAL/PLATELET
Basophils Absolute: 0.1 10*3/uL (ref 0.0–0.1)
Basophils Relative: 0.8 % (ref 0.0–3.0)
Eosinophils Absolute: 0.5 10*3/uL (ref 0.0–0.7)
Eosinophils Relative: 6.1 % — ABNORMAL HIGH (ref 0.0–5.0)
HCT: 42.7 % (ref 39.0–52.0)
Hemoglobin: 14 g/dL (ref 13.0–17.0)
Lymphocytes Relative: 18 % (ref 12.0–46.0)
Lymphs Abs: 1.4 10*3/uL (ref 0.7–4.0)
MCHC: 32.8 g/dL (ref 30.0–36.0)
MCV: 92.8 fl (ref 78.0–100.0)
Monocytes Absolute: 0.6 10*3/uL (ref 0.1–1.0)
Monocytes Relative: 7.3 % (ref 3.0–12.0)
Neutro Abs: 5.1 10*3/uL (ref 1.4–7.7)
Neutrophils Relative %: 67.8 % (ref 43.0–77.0)
Platelets: 119 10*3/uL — ABNORMAL LOW (ref 150.0–400.0)
RBC: 4.6 Mil/uL (ref 4.22–5.81)
RDW: 14 % (ref 11.5–15.5)
WBC: 7.5 10*3/uL (ref 4.0–10.5)

## 2021-10-30 NOTE — Assessment & Plan Note (Signed)
CXR ordered pending results. May consider daily flovent.

## 2021-10-30 NOTE — Assessment & Plan Note (Signed)
Recommended daily antihistamine as well as starting flonase. Also pending CXR to r/o bacterial etiologies or other concerns. Suspect maybe allergy and or asthma exacerbated. Pending CXR, and then if negative for bacterial may consider prednisone as well as maintenance inhaler daily such as flovent.

## 2021-10-30 NOTE — Patient Instructions (Addendum)
Complete xray(s) prior to leaving today. I will notify you of your results once received.  I will let you know plan of care pending results of the chest xray and lab work.  I do recommend in the meantime you start using flonase daily to help with sneezing/allergy symptoms.   It was a pleasure seeing you today! Please do not hesitate to reach out with any questions and or concerns.  Regards,   Eugenia Pancoast FNP-C

## 2021-10-30 NOTE — Progress Notes (Signed)
Established Patient Office Visit  Subjective:  Patient ID: [name] [name], male    DOB: 08-09-1946  Age: 76 y.o. MRN: [id number]  CC:  Chief Complaint  Patient presents with   chest congestioin    HPI [name] [name] is here today with concerns.   1/4 pt seen with me for 3 week h/o URI symptoms. CXR showed middle lobe opacities susp for developing bronchopneumonia. Noted cardiomegaly as well. Sent rx for doxycycyline 100 mg po bid x 10 days as well as augmentin 875/125 mg po bid x 10 days. Pt did start taking with probiotics which was helpful for his stomach to complete the duration of therapy. Has completed duration of prednisone therapy as well.  1/13: bronchopneumonia resolved. Pt still with mild cough, however hasn't been much of an issue. Notices it more at night when he has increased coughing/chest tightness.   Pt states did start to have relief after that, however three days ago started again with worsening cough and having to use albuterol inhaler (three days ago X2, yesterday X3, and today not yet but woke up with a decent productive cough where he was able to get some stuff up. Occasionally his sputum in the last few days is green/clear. C/o rhinorrhea, sneezing occasionally with some mild nasal congestion. No ear pain. No fever or chills. No sob except with coughing fits. Pt states did have asthma in his youth.    Past Medical History:  Diagnosis Date   Atrial fibrillation (HCC)    CHF (congestive heart failure) ([place])    Cirrhosis (HCC)    ED (erectile dysfunction)    Hyperglycemia    Hyperlipidemia    Hypertension    Internal hemorrhoids    SCCA (squamous cell carcinoma) of skin    Left ear.  MOHS surgery 2014 [facility]   Skin cancer of face    2012 basal cell    Past Surgical History:  Procedure Laterality Date   A-Fib Cardiomyopathy [facility])  10/2006   EF 25 - 35%   DOPPLER ECHOCARDIOGRAPHY  10/31/2006   EF 25-35% Mod. M.R., mild -mod TR    Family History   Problem Relation Age of Onset   Hypertension Mother    Cancer Mother        Bilat. 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   Occupation: Building services engineer, Art therapist [employer]   Occupation: Semi-retired  Tobacco Use   Smoking status: Never   Smokeless tobacco: Never  Substance and Sexual Activity   Alcohol use: No    Comment: h/o abuse, quit 2008, rare use as of 2014, none since 09/2014   Drug use: No   Sexual activity: Not on file  Other Topics Concern   Not on file  Social History Narrative   Left handed  2 sons initially, 1 died 07/2009 from [place], his other son is local   From [place], [place]   Works some at [employer] as of 2022   Enjoys working in the yard   Married 1967   Caffeine daily      Wife with memory loss.    Social Determinants of Health   Financial Resource Strain: Not on file  Food Insecurity: Not on file  Transportation Needs: Not on file  Physical Activity: Not on file  Stress: Not on file  Social Connections: Not on file  Intimate Partner Violence: Not on file    Outpatient Medications Prior to Visit  Medication Sig Dispense Refill   albuterol (PROAIR HFA) 108 (90 Base) MCG/ACT inhaler Inhale 1-2 puffs into the lungs every 6 (six) hours as needed for wheezing or shortness of breath. Fill with proair/albuterol/ventolin 1 each 1   apixaban (ELIQUIS) 5 MG TABS tablet Take by mouth.     carvedilol (COREG) 25 MG tablet Take 25 mg by mouth 2 (two) times  daily with a meal.     digoxin (LANOXIN) 0.125 MG tablet Take 0.125 mg by mouth daily.     doxazosin (CARDURA) 4 MG tablet Take 1 tablet (4 mg total) by mouth at bedtime. 90 tablet 3   ENTRESTO 24-26 MG Take 1 tablet by mouth 2 (two) times daily.     fish oil-omega-3 fatty acids 1000 MG capsule Take 1 g by mouth daily.     furosemide (LASIX) 20 MG tablet Take 2 tablets (40 mg total) by mouth daily as needed.     levothyroxine (SYNTHROID) 137 MCG tablet TAKE 1 TABLET BY MOUTH ONCE DAILY BEFORE BREAKFAST 90 tablet 3   Melatonin 5 MG CAPS Take 5 mg by mouth daily.      Multiple Vitamin (MULTIVITAMIN) tablet Take 1 tablet by mouth daily.     pravastatin (PRAVACHOL) 40 MG tablet Take 1 tablet by mouth once daily 90 tablet 0   sildenafil (VIAGRA) 100 MG tablet Take 100 mg by mouth daily as needed.     spironolactone (ALDACTONE) 25 MG tablet TAKE 1 TABLET BY MOUTH ONCE DAILY     amoxicillin-clavulanate (AUGMENTIN) 875-125 MG tablet Take 1 tablet by mouth 2 (two) times daily. 20 tablet 0   predniSONE (DELTASONE) 20 MG tablet Take two tablets (total 40 mg) po qd for two days, then take one tablet po qd for three days (total 20 mg qd) 7 tablet 0   No facility-administered medications prior to visit.    No Known Allergies  ROS Review of Systems  Constitutional:  Negative for chills and fever.  HENT:  Positive for congestion, postnasal drip, rhinorrhea and sneezing. Negative for ear pain, sinus pain and sore throat.   Respiratory:  Positive for cough, shortness of breath (with coughing attacks) and wheezing.   Cardiovascular:  Negative for chest pain and palpitations.     Objective:    Physical Exam Constitutional:      General: He is awake. He is not in acute distress.    Appearance: Normal appearance. He is normal weight. He is not ill-appearing, toxic-appearing or diaphoretic.  HENT:     Right Ear: Tympanic membrane normal.     Left Ear: Tympanic membrane normal.     Nose: Nose normal.      Right Turbinates: Not enlarged or swollen.     Left Turbinates: Not enlarged or swollen.     Right Sinus: No maxillary sinus tenderness or frontal sinus  tenderness.     Left Sinus: No maxillary sinus tenderness or frontal sinus tenderness.     Mouth/Throat:     Mouth: Mucous membranes are moist.     Pharynx: No pharyngeal swelling, oropharyngeal exudate or posterior oropharyngeal erythema.  Eyes:     Extraocular Movements: Extraocular movements intact.     Pupils: Pupils are equal, round, and reactive to light.  Cardiovascular:     Rate and Rhythm: Normal rate and regular rhythm.  Pulmonary:     Effort: Pulmonary effort is normal.     Breath sounds: Examination of the right-upper field reveals wheezing. Examination of the right-lower field reveals wheezing. Wheezing present. No rhonchi or rales.     Comments: Fleeting wheezes resolved after a few breaths  Neurological:     Mental Status: He is alert.    BP 126/78    Pulse 74    Temp (!) 96.5 F (35.8 C) (Temporal)    Ht 5\' 10"  (1.778 m)    Wt 176 lb (79.8 kg)    SpO2 97%    BMI 25.25 kg/m  Wt Readings from Last 3 Encounters:  10/30/21 176 lb (79.8 kg)  10/10/21 174 lb (78.9 kg)  10/01/21 176 lb (79.8 kg)     Health Maintenance Due  Topic Date Due   Zoster Vaccines- Shingrix (1 of 2) Never done   TETANUS/TDAP  07/31/2019   COVID-19 Vaccine (3 - Pfizer risk series) 01/02/2020   INFLUENZA VACCINE  04/28/2021    There are no preventive care reminders to display for this patient.  Lab Results  Component Value Date   TSH 3.25 12/12/2020   Lab Results  Component Value Date   WBC 7.1 12/12/2020   HGB 14.3 12/12/2020   HCT 41.7 12/12/2020   MCV 89.1 12/12/2020   PLT 130.0 (L) 12/12/2020   Lab Results  Component Value Date   NA 136 12/12/2020   K 4.5 12/12/2020   CO2 29 12/12/2020   GLUCOSE 112 (H) 12/12/2020   BUN 21 12/12/2020   CREATININE 1.26 12/12/2020   BILITOT 1.4 (H) 12/12/2020   ALKPHOS 49 12/12/2020    AST 16 12/12/2020   ALT 21 12/12/2020   PROT 6.8 12/12/2020   ALBUMIN 4.2 12/12/2020   CALCIUM 9.1 12/12/2020   GFR 56.27 (L) 12/12/2020   No results found for: HGBA1C    Assessment & Plan:   Problem List Items Addressed This Visit       Other   Subacute cough - Primary    Recommended daily antihistamine as well as starting flonase. Also pending CXR to r/o bacterial etiologies or other concerns. Suspect maybe allergy and or asthma exacerbated. Pending CXR, and then if negative for bacterial may consider prednisone as well as maintenance inhaler daily such as flovent.      Relevant Orders   DG Chest 2 View   CBC w/Diff   Wheezing    CXR ordered pending results. May consider daily flovent.      Relevant Orders   DG Chest 2 View   CBC w/Diff    No orders of the defined types were placed in this encounter.   Follow-up: No follow-ups on file.    Eugenia Pancoast, FNP

## 2021-10-31 ENCOUNTER — Encounter: Payer: Self-pay | Admitting: Family

## 2021-10-31 ENCOUNTER — Other Ambulatory Visit: Payer: Self-pay | Admitting: Family

## 2021-10-31 ENCOUNTER — Other Ambulatory Visit (INDEPENDENT_AMBULATORY_CARE_PROVIDER_SITE_OTHER): Payer: Medicare HMO

## 2021-10-31 DIAGNOSIS — I509 Heart failure, unspecified: Secondary | ICD-10-CM | POA: Diagnosis not present

## 2021-10-31 DIAGNOSIS — Z5181 Encounter for therapeutic drug level monitoring: Secondary | ICD-10-CM

## 2021-10-31 DIAGNOSIS — R052 Subacute cough: Secondary | ICD-10-CM

## 2021-10-31 DIAGNOSIS — R0989 Other specified symptoms and signs involving the circulatory and respiratory systems: Secondary | ICD-10-CM | POA: Diagnosis not present

## 2021-10-31 LAB — BRAIN NATRIURETIC PEPTIDE: Pro B Natriuretic peptide (BNP): 385 pg/mL — ABNORMAL HIGH (ref 0.0–100.0)

## 2021-10-31 MED ORDER — FLUTICASONE PROPIONATE 50 MCG/ACT NA SUSP: 2.0000 | Freq: Every day | NASAL | 6 refills | Status: DC

## 2021-10-31 NOTE — Progress Notes (Signed)
With pulmonary vascular congestion, my concern is more with CHF.

## 2021-10-31 NOTE — Progress Notes (Signed)
Called again to Dr. Ubaldo Glassing (cardiologist) office, and asked for return call as they had not returned from this am for appt stat. Front desk stated everyone was gone for the day, but this request would again be sent to on call doc and  I should get a call back sometime by end of day. I will call pt to have him follow up with me next week to monitor him closely until seen by cardiology.

## 2021-10-31 NOTE — Progress Notes (Signed)
Pt scheduled for 1045 am with cardiologist Dr. Corky Sox. Pt called by Rachel Bo, MA and updated on info and given address. Pt to continue with lasix 40 mg once daily. Pt to f/u one week for lab only repeat to check potassium, pt aware.

## 2021-10-31 NOTE — Progress Notes (Signed)
Please call pt and let him know I made an appointment for him with cardiology with Dr. Corky Sox on Monday 2/5 at 1045 am. Address is [address], [place], [place].   Also let pt know his recent BNP from today is mildly elevated confirming CHF exacerbation. As instructed earlier from me have him take the furosemide 40 mg once daily now. Have him come in for lab only (next Friday, no need for earlier) again next week to repeat his potassium to make sure stable. Putting in order now.

## 2021-10-31 NOTE — Progress Notes (Signed)
Pt scheduled today for BNP/stat  Also called and spoke with pt on phone, pt states feeling better than yesterday/ denies current sob, only with coughing attacks that mainly occur upon lying down at night, no recent weight change he has noted, no DOE, still with cough, denies LE edema- not currently taking furosemide, had him restart 40 mg once daily.   Called Dr. Bethanne Ginger office (pts cardiologist) they are going to call me back today to try to fit him in.

## 2021-11-03 DIAGNOSIS — I480 Paroxysmal atrial fibrillation: Secondary | ICD-10-CM | POA: Diagnosis not present

## 2021-11-03 DIAGNOSIS — I429 Cardiomyopathy, unspecified: Secondary | ICD-10-CM | POA: Diagnosis not present

## 2021-11-03 DIAGNOSIS — J449 Chronic obstructive pulmonary disease, unspecified: Secondary | ICD-10-CM | POA: Diagnosis not present

## 2021-11-03 DIAGNOSIS — I10 Essential (primary) hypertension: Secondary | ICD-10-CM | POA: Diagnosis not present

## 2021-11-03 DIAGNOSIS — R0602 Shortness of breath: Secondary | ICD-10-CM | POA: Diagnosis not present

## 2021-11-03 DIAGNOSIS — R058 Other specified cough: Secondary | ICD-10-CM | POA: Diagnosis not present

## 2021-11-03 NOTE — Progress Notes (Signed)
Pt was notified Friday 2/3 of appt Monday. Called him again today to confirm that he knew about 1045 am, he does. He did start his lasix over the weekend. Still with cough, sometimes clear, sometimes colored /green/yellow. He did start flonase today to see if this will help as eosinophils also were elevated. Will await cardiac eval and move forward with cough prn

## 2021-11-07 ENCOUNTER — Other Ambulatory Visit: Payer: Medicare HMO

## 2021-11-11 ENCOUNTER — Other Ambulatory Visit: Payer: Self-pay

## 2021-11-11 ENCOUNTER — Other Ambulatory Visit (INDEPENDENT_AMBULATORY_CARE_PROVIDER_SITE_OTHER): Payer: Medicare HMO

## 2021-11-11 ENCOUNTER — Other Ambulatory Visit: Payer: Medicare HMO

## 2021-11-11 DIAGNOSIS — Z5181 Encounter for therapeutic drug level monitoring: Secondary | ICD-10-CM | POA: Diagnosis not present

## 2021-11-11 DIAGNOSIS — Z79899 Other long term (current) drug therapy: Secondary | ICD-10-CM | POA: Diagnosis not present

## 2021-11-12 LAB — BASIC METABOLIC PANEL
BUN: 20 mg/dL (ref 6–23)
CO2: 31 mEq/L (ref 19–32)
Calcium: 9.5 mg/dL (ref 8.4–10.5)
Chloride: 98 mEq/L (ref 96–112)
Creatinine, Ser: 1.24 mg/dL (ref 0.40–1.50)
GFR: 56.99 mL/min — ABNORMAL LOW (ref >60.00)
Glucose, Bld: 100 mg/dL — ABNORMAL HIGH (ref 70–99)
Potassium: 4.5 mEq/L (ref 3.5–5.1)
Sodium: 137 mEq/L (ref 135–145)

## 2021-11-14 ENCOUNTER — Other Ambulatory Visit: Payer: Self-pay | Admitting: Family Medicine

## 2021-11-21 ENCOUNTER — Other Ambulatory Visit: Payer: Self-pay

## 2021-11-21 ENCOUNTER — Ambulatory Visit (INDEPENDENT_AMBULATORY_CARE_PROVIDER_SITE_OTHER)
Admission: RE | Admit: 2021-11-21 | Discharge: 2021-11-21 | Disposition: A | Discharge status: Home / Self Care | Payer: Medicare HMO | Source: Ambulatory Visit | Attending: Family Medicine | Admitting: Family Medicine

## 2021-11-21 ENCOUNTER — Encounter: Payer: Self-pay | Admitting: Family Medicine

## 2021-11-21 ENCOUNTER — Ambulatory Visit (INDEPENDENT_AMBULATORY_CARE_PROVIDER_SITE_OTHER): Payer: Medicare HMO | Admitting: Family Medicine

## 2021-11-21 VITALS — BP 110/64 | HR 82 | Temp 98.6°F | Ht 70.0 in | Wt 168.0 lb

## 2021-11-21 DIAGNOSIS — R059 Cough, unspecified: Secondary | ICD-10-CM

## 2021-11-21 DIAGNOSIS — J209 Acute bronchitis, unspecified: Secondary | ICD-10-CM | POA: Diagnosis not present

## 2021-11-21 DIAGNOSIS — R062 Wheezing: Secondary | ICD-10-CM

## 2021-11-21 DIAGNOSIS — J180 Bronchopneumonia, unspecified organism: Secondary | ICD-10-CM

## 2021-11-21 DIAGNOSIS — J189 Pneumonia, unspecified organism: Secondary | ICD-10-CM | POA: Diagnosis not present

## 2021-11-21 DIAGNOSIS — I517 Cardiomegaly: Secondary | ICD-10-CM | POA: Diagnosis not present

## 2021-11-21 MED ORDER — LEVOFLOXACIN 750 MG PO TABS: 750.0000 mg | ORAL_TABLET | Freq: Every day | ORAL | 0 refills | Status: DC

## 2021-11-21 MED ORDER — PREDNISONE 10 MG PO TABS: ORAL_TABLET | ORAL | 0 refills | Status: DC

## 2021-11-21 NOTE — Patient Instructions (Signed)
Go to the lab on the way out.   If you have mychart we'll likely use that to update you.    Start prednisone with food.  Start levaquin daily. Use albuterol if needed.   Update me about your situation on Monday.  If worse in the meantime, then call the triage line at [phone].   Take care.  Glad to see you.

## 2021-11-21 NOTE — Progress Notes (Signed)
This visit occurred during the SARS-CoV-2 public health emergency.  Safety protocols were in place, including screening questions prior to the visit, additional usage of staff PPE, and extensive cleaning of exam room while observing appropriate contact time as indicated for disinfecting solutions.  No new meds but cough noted for the last 2 months.  Cough started middle of 08/2021.  ST and then chest congestion.  Cough with sputum, can be yellow vs clear.  Inc SABA use in the last few weeks.  Prev abx course done- then needed SABA less.  Then sx increased in the meantime.  No fevers.  Some wheeze, better with SABA use.  No vomiting, no diarrhea.    Meds, vitals, and allergies reviewed.   ROS: Per HPI unless specifically indicated in ROS section   [place]ck supple, no LA IRR, not tachy Occ cough.  Ronchi and wheeze on the R side but not on the L.  No focal dec in BS.   Abdomen soft.  Nontender.  Normal bowel sounds. Extremities well perfused.

## 2021-11-24 NOTE — Assessment & Plan Note (Addendum)
Given his history of pneumonia and his asymmetric lung findings, we talked about options  Start prednisone with food.  Start levaquin daily. Use albuterol if needed.  See notes on chest x-ray.  I asked him to update me in a few days.  See after visit summary.  Still okay for outpatient follow-up.  He agrees with plan.  Fluoroquinolone cautions discussed with patient.  Would use in this scenario given his previous antibiotic use and his exam findings.

## 2021-11-27 ENCOUNTER — Other Ambulatory Visit: Payer: Self-pay | Admitting: Family Medicine

## 2021-11-27 ENCOUNTER — Encounter: Payer: Self-pay | Admitting: Family Medicine

## 2021-12-08 MED ORDER — ALBUTEROL SULFATE HFA 108 (90 BASE) MCG/ACT IN AERS: INHALATION_SPRAY | RESPIRATORY_TRACT | 1 refills | Status: DC

## 2021-12-15 ENCOUNTER — Other Ambulatory Visit: Payer: Self-pay | Admitting: Family Medicine

## 2021-12-15 DIAGNOSIS — I4891 Unspecified atrial fibrillation: Secondary | ICD-10-CM

## 2021-12-15 DIAGNOSIS — E039 Hypothyroidism, unspecified: Secondary | ICD-10-CM

## 2021-12-15 DIAGNOSIS — I10 Essential (primary) hypertension: Secondary | ICD-10-CM

## 2021-12-15 DIAGNOSIS — I509 Heart failure, unspecified: Secondary | ICD-10-CM

## 2021-12-16 ENCOUNTER — Other Ambulatory Visit: Payer: Self-pay

## 2021-12-16 ENCOUNTER — Other Ambulatory Visit (INDEPENDENT_AMBULATORY_CARE_PROVIDER_SITE_OTHER): Payer: Medicare HMO

## 2021-12-16 DIAGNOSIS — I10 Essential (primary) hypertension: Secondary | ICD-10-CM

## 2021-12-16 DIAGNOSIS — E039 Hypothyroidism, unspecified: Secondary | ICD-10-CM | POA: Diagnosis not present

## 2021-12-16 DIAGNOSIS — I509 Heart failure, unspecified: Secondary | ICD-10-CM

## 2021-12-16 DIAGNOSIS — I4891 Unspecified atrial fibrillation: Secondary | ICD-10-CM

## 2021-12-16 LAB — COMPREHENSIVE METABOLIC PANEL
ALT: 14 U/L (ref 0–53)
AST: 13 U/L (ref 0–37)
Albumin: 4.2 g/dL (ref 3.5–5.2)
Alkaline Phosphatase: 42 U/L (ref 39–117)
BUN: 19 mg/dL (ref 6–23)
CO2: 31 mEq/L (ref 19–32)
Calcium: 9.2 mg/dL (ref 8.4–10.5)
Chloride: 102 mEq/L (ref 96–112)
Creatinine, Ser: 1.17 mg/dL (ref 0.40–1.50)
GFR: 61.07 mL/min (ref >60.00)
Glucose, Bld: 110 mg/dL — ABNORMAL HIGH (ref 70–99)
Potassium: 4.7 mEq/L (ref 3.5–5.1)
Sodium: 139 mEq/L (ref 135–145)
Total Bilirubin: 2 mg/dL — ABNORMAL HIGH (ref 0.2–1.2)
Total Protein: 6.3 g/dL (ref 6.0–8.3)

## 2021-12-16 LAB — CBC WITH DIFFERENTIAL/PLATELET
Basophils Absolute: 0.1 10*3/uL (ref 0.0–0.1)
Basophils Relative: 1.4 % (ref 0.0–3.0)
Eosinophils Absolute: 0.6 10*3/uL (ref 0.0–0.7)
Eosinophils Relative: 8.6 % — ABNORMAL HIGH (ref 0.0–5.0)
HCT: 41.2 % (ref 39.0–52.0)
Hemoglobin: 14 g/dL (ref 13.0–17.0)
Lymphocytes Relative: 19.9 % (ref 12.0–46.0)
Lymphs Abs: 1.3 10*3/uL (ref 0.7–4.0)
MCHC: 33.9 g/dL (ref 30.0–36.0)
MCV: 93.3 fl (ref 78.0–100.0)
Monocytes Absolute: 0.5 10*3/uL (ref 0.1–1.0)
Monocytes Relative: 7.7 % (ref 3.0–12.0)
Neutro Abs: 4.1 10*3/uL (ref 1.4–7.7)
Neutrophils Relative %: 62.4 % (ref 43.0–77.0)
Platelets: 117 10*3/uL — ABNORMAL LOW (ref 150.0–400.0)
RBC: 4.42 Mil/uL (ref 4.22–5.81)
RDW: 15.5 % (ref 11.5–15.5)
WBC: 6.6 10*3/uL (ref 4.0–10.5)

## 2021-12-16 LAB — LIPID PANEL
Cholesterol: 156 mg/dL (ref 0–200)
HDL: 57.5 mg/dL (ref >39.00)
LDL Cholesterol: 84 mg/dL (ref 0–99)
NonHDL: 98.17
Total CHOL/HDL Ratio: 3
Triglycerides: 69 mg/dL (ref 0.0–149.0)
VLDL: 13.8 mg/dL (ref 0.0–40.0)

## 2021-12-16 LAB — TSH: TSH: 2.09 u[IU]/mL (ref 0.35–5.50)

## 2021-12-17 DIAGNOSIS — L570 Actinic keratosis: Secondary | ICD-10-CM | POA: Diagnosis not present

## 2021-12-17 DIAGNOSIS — C434 Malignant melanoma of scalp and neck: Secondary | ICD-10-CM | POA: Diagnosis not present

## 2021-12-17 DIAGNOSIS — C44329 Squamous cell carcinoma of skin of other parts of face: Secondary | ICD-10-CM | POA: Diagnosis not present

## 2021-12-17 DIAGNOSIS — D2272 Melanocytic nevi of left lower limb, including hip: Secondary | ICD-10-CM | POA: Diagnosis not present

## 2021-12-17 DIAGNOSIS — R58 Hemorrhage, not elsewhere classified: Secondary | ICD-10-CM | POA: Diagnosis not present

## 2021-12-17 DIAGNOSIS — Z85828 Personal history of other malignant neoplasm of skin: Secondary | ICD-10-CM | POA: Diagnosis not present

## 2021-12-17 DIAGNOSIS — D2262 Melanocytic nevi of left upper limb, including shoulder: Secondary | ICD-10-CM | POA: Diagnosis not present

## 2021-12-17 DIAGNOSIS — D225 Melanocytic nevi of trunk: Secondary | ICD-10-CM | POA: Diagnosis not present

## 2021-12-17 DIAGNOSIS — X32XXXA Exposure to sunlight, initial encounter: Secondary | ICD-10-CM | POA: Diagnosis not present

## 2021-12-17 DIAGNOSIS — D485 Neoplasm of uncertain behavior of skin: Secondary | ICD-10-CM | POA: Diagnosis not present

## 2021-12-17 LAB — DIGOXIN LEVEL: Digoxin Level: 0.8 mcg/L (ref 0.8–2.0)

## 2021-12-22 ENCOUNTER — Ambulatory Visit (INDEPENDENT_AMBULATORY_CARE_PROVIDER_SITE_OTHER)
Admission: RE | Admit: 2021-12-22 | Discharge: 2021-12-22 | Disposition: A | Discharge status: Home / Self Care | Payer: Medicare HMO | Source: Ambulatory Visit | Attending: Family Medicine | Admitting: Family Medicine

## 2021-12-22 ENCOUNTER — Ambulatory Visit (INDEPENDENT_AMBULATORY_CARE_PROVIDER_SITE_OTHER): Payer: Medicare HMO | Admitting: Family Medicine

## 2021-12-22 ENCOUNTER — Other Ambulatory Visit: Payer: Self-pay

## 2021-12-22 ENCOUNTER — Encounter: Payer: Self-pay | Admitting: Family Medicine

## 2021-12-22 VITALS — BP 122/84 | HR 70 | Temp 97.7°F | Ht 70.0 in | Wt 169.0 lb

## 2021-12-22 DIAGNOSIS — R062 Wheezing: Secondary | ICD-10-CM

## 2021-12-22 DIAGNOSIS — J209 Acute bronchitis, unspecified: Secondary | ICD-10-CM

## 2021-12-22 DIAGNOSIS — R69 Illness, unspecified: Secondary | ICD-10-CM | POA: Diagnosis not present

## 2021-12-22 DIAGNOSIS — Z7189 Other specified counseling: Secondary | ICD-10-CM

## 2021-12-22 DIAGNOSIS — Z0000 Encounter for general adult medical examination without abnormal findings: Secondary | ICD-10-CM | POA: Diagnosis not present

## 2021-12-22 DIAGNOSIS — E7800 Pure hypercholesterolemia, unspecified: Secondary | ICD-10-CM | POA: Diagnosis not present

## 2021-12-22 DIAGNOSIS — R0602 Shortness of breath: Secondary | ICD-10-CM | POA: Diagnosis not present

## 2021-12-22 DIAGNOSIS — J180 Bronchopneumonia, unspecified organism: Secondary | ICD-10-CM | POA: Diagnosis not present

## 2021-12-22 DIAGNOSIS — I517 Cardiomegaly: Secondary | ICD-10-CM | POA: Diagnosis not present

## 2021-12-22 DIAGNOSIS — I509 Heart failure, unspecified: Secondary | ICD-10-CM

## 2021-12-22 DIAGNOSIS — R059 Cough, unspecified: Secondary | ICD-10-CM | POA: Diagnosis not present

## 2021-12-22 DIAGNOSIS — E039 Hypothyroidism, unspecified: Secondary | ICD-10-CM

## 2021-12-22 DIAGNOSIS — I10 Essential (primary) hypertension: Secondary | ICD-10-CM | POA: Diagnosis not present

## 2021-12-22 DIAGNOSIS — I480 Paroxysmal atrial fibrillation: Secondary | ICD-10-CM | POA: Diagnosis not present

## 2021-12-22 MED ORDER — LEVOFLOXACIN 750 MG PO TABS: 750.0000 mg | ORAL_TABLET | Freq: Every day | ORAL | 0 refills | Status: DC

## 2021-12-22 MED ORDER — PRAVASTATIN SODIUM 40 MG PO TABS: 40.0000 mg | ORAL_TABLET | Freq: Every day | ORAL | 3 refills | Status: DC

## 2021-12-22 MED ORDER — DOXAZOSIN MESYLATE 4 MG PO TABS: 4.0000 mg | ORAL_TABLET | Freq: Every day | ORAL | 3 refills | Status: DC

## 2021-12-22 MED ORDER — ALBUTEROL SULFATE HFA 108 (90 BASE) MCG/ACT IN AERS: INHALATION_SPRAY | RESPIRATORY_TRACT | 4 refills | Status: DC

## 2021-12-22 MED ORDER — PREDNISONE 10 MG PO TABS: ORAL_TABLET | ORAL | 0 refills | Status: DC

## 2021-12-22 NOTE — Patient Instructions (Signed)
Go to the lab on the way out.   If you have mychart we'll likely use that to update you.    ?Start levaquin and prednisone.  Take prednisone with food.  ?We'll call about seeing pulmonary.  ?Take care.  Glad to see you. ?

## 2021-12-22 NOTE — Progress Notes (Signed)
This visit occurred during the SARS-CoV-2 public health emergency.  Safety protocols were in place, including screening questions prior to the visit, additional usage of staff PPE, and extensive cleaning of exam room while observing appropriate contact time as indicated for disinfecting solutions. ? ?I have personally reviewed the Medicare Annual Wellness questionnaire and have noted ?1. The patient's medical and social history ?2. Their use of alcohol, tobacco or illicit drugs ?3. Their current medications and supplements ?4. The patient's functional ability including ADL's, fall risks, home safety risks and hearing or visual ?            impairment. ?5. Diet and physical activities ?6. Evidence for depression or mood disorders ? ?The patients weight, height, BMI have been recorded in the chart and visual acuity is per eye clinic.  ?I have made referrals, counseling and provided education to the patient based review of the above and I have provided the pt with a written personalized care plan for preventive services. ? ?Provider list updated- see scanned forms.  Routine anticipatory guidance given to patient.  See health maintenance. The possibility exists that previously documented standard health maintenance information may have been brought forward from a previous encounter into this note.  If needed, that same information has been updated to reflect the current situation based on today's encounter.   ? ?Flu prev done.  ?Shingles previously done at [facility] 2021 ?PNA previously done ?Tetanus 2010, discussed with patient.  Done at [facility] in 2021 ?COVID-vaccine previously done ?Cologuard -2021 ?Prostate cancer screening deferred.  Discussed with patient. ?Advance directive- son designated if patient were incapacitated. ?Cognitive function addressed- see scanned forms- and if abnormal then additional documentation follows.  ? ?In addition to [facility], follow up visit for the below conditions: ? ?CHF.  Taking lasix  about 2x/week, PRN.  No CP.  Not lightheaded.  Edema usually controlled.  No heart racing.  No bleeding on anticoagulation except for occ hemorrhoid irritation (chronic issue over the years). ? ?Hypothyroidism.  No neck mass, no dysphagia.  Compliant, early AM, fasting.   ? ?Elevated Cholesterol: ?Using medications without problems:yes ?Muscle aches: no ?Diet compliance: d/w pt.  ?Exercise: d/w pt.   ? ?Cough.  Most recent CXR from about 1 month ago w/o PNA.  He was clearly better with abx tx prev, sx resolved.  Sx restarted about 2-3 weeks ago.  Comes in fits, had a sig episode of coughing last night.  Sputum is green and yellow.  No fevers.  Some wheeze.  Using SABA, it helps some.  Quinolone cautions d/w pt.  Steroid cautions d/w pt.   ? ?Weight loss attributed to dec in appetite after death of wife and also healthier diet in general.  Condolences offered regarding the death of his wife.  Discussed. ? ?PMH and SH reviewed ? ?Meds, vitals, and allergies reviewed.  ? ?ROS: Per HPI.  Unless specifically indicated otherwise in HPI, the patient denies: ? ?General: fever. ?Eyes: acute vision changes ?ENT: sore throat ?Cardiovascular: chest pain ?Respiratory: SOB ?GI: vomiting ?GU: dysuria ?Musculoskeletal: acute back pain ?Derm: acute rash ?Neuro: acute motor dysfunction ?Psych: worsening mood ?Endocrine: polydipsia ?Heme: bleeding ?Allergy: hayfever ? ?GEN: nad, alert and oriented ?HEENT: ncat ?NECK: supple w/o LA ?CV: IRR, not tachy ?PULM: diffuse rhonchi with scattered wheeze, no focal dec in BS, no inc wob ?ABD: soft, +bs ?EXT: no edema ?SKIN: well perfused.   ?

## 2021-12-24 ENCOUNTER — Encounter: Payer: Self-pay | Admitting: Family Medicine

## 2021-12-24 NOTE — Assessment & Plan Note (Signed)
History of pneumonia noted.  Chest x-ray from 1 month ago without pneumonia.  Images reviewed with patient.  He clearly improved on antibiotics previously.  Quinolone steroid cautions discussed with patient.  Still okay for outpatient follow-up.  Would start Levaquin and prednisone, check chest x-ray, refer to pulmonology.  See notes on imaging.  ?

## 2021-12-24 NOTE — Assessment & Plan Note (Signed)
Taking lasix about 2x/week, PRN.  No CP.  Not lightheaded.  Edema usually controlled.  No heart racing.  No bleeding on anticoagulation except for occ hemorrhoid irritation (chronic issue over the years).  Continue Lasix Eliquis carvedilol digoxin doxazosin Entresto spironolactone.  Labs discussed with patient. ?

## 2021-12-24 NOTE — Assessment & Plan Note (Signed)
Flu prev done.  ?Shingles previously done at [facility] 2021 ?PNA previously done ?Tetanus 2010, discussed with patient.  Done at [facility] in 2021 ?COVID-vaccine previously done ?Cologuard -2021 ?Prostate cancer screening deferred.  Discussed with patient. ?Advance directive- son designated if patient were incapacitated. ?Cognitive function addressed- see scanned forms- and if abnormal then additional documentation follows.  ?

## 2021-12-24 NOTE — Assessment & Plan Note (Signed)
Continue levothyroxine 

## 2021-12-24 NOTE — Assessment & Plan Note (Signed)
Continue pravastatin 

## 2021-12-25 DIAGNOSIS — C44329 Squamous cell carcinoma of skin of other parts of face: Secondary | ICD-10-CM | POA: Diagnosis not present

## 2021-12-25 DIAGNOSIS — C434 Malignant melanoma of scalp and neck: Secondary | ICD-10-CM | POA: Diagnosis not present

## 2021-12-25 DIAGNOSIS — D485 Neoplasm of uncertain behavior of skin: Secondary | ICD-10-CM | POA: Diagnosis not present

## 2021-12-28 ENCOUNTER — Encounter: Payer: Self-pay | Admitting: Family Medicine

## 2021-12-28 DIAGNOSIS — C439 Malignant melanoma of skin, unspecified: Secondary | ICD-10-CM | POA: Insufficient documentation

## 2021-12-30 ENCOUNTER — Encounter: Payer: Self-pay | Admitting: Family Medicine

## 2021-12-30 NOTE — Telephone Encounter (Signed)
His referral has already been sent to [facility].  ? ?He is already aware of where his referral was being sent. He needs to reach out to them to schedule. I do not schedule appts for other offices. He does not need to be calling us, he should be calling the referred office to follow up.  ? ?Mychart message sent. ? ? ? ?

## 2022-01-14 DIAGNOSIS — C434 Malignant melanoma of scalp and neck: Secondary | ICD-10-CM | POA: Diagnosis not present

## 2022-01-15 ENCOUNTER — Other Ambulatory Visit: Payer: Self-pay | Admitting: Family Medicine

## 2022-01-15 ENCOUNTER — Telehealth: Payer: Self-pay | Admitting: Family Medicine

## 2022-01-15 DIAGNOSIS — R062 Wheezing: Secondary | ICD-10-CM

## 2022-01-15 DIAGNOSIS — J209 Acute bronchitis, unspecified: Secondary | ICD-10-CM

## 2022-01-15 MED ORDER — PREDNISONE 10 MG PO TABS: ORAL_TABLET | ORAL | 0 refills | Status: DC

## 2022-01-15 MED ORDER — LEVOFLOXACIN 750 MG PO TABS: 750.0000 mg | ORAL_TABLET | Freq: Every day | ORAL | 0 refills | Status: DC

## 2022-01-15 NOTE — Telephone Encounter (Signed)
Noted  

## 2022-01-15 NOTE — Telephone Encounter (Signed)
Please triage patient.  Given his history I sent a prescription for antibiotics and prednisone in the meantime but if he is clearly symptomatic then he likely needs evaluation in the meantime, potentially to urgent care.  Thanks. ?

## 2022-01-15 NOTE — Telephone Encounter (Addendum)
I spoke with [name] [name];[name] [name] notified as instructed and [name] [name] voiced understanding. [name] [name] continues with prod cough with clear to yellow green phlegm; [name] [name] has wheezing after coughing episodes; [name] [name] has SOB after coughing episodes and upon exertion and SOB is worse than usual. No fever. [name] [name] said he is in no distress with his breathing now and that it would be really difficult for him to go anywhere today. [name] [name] said has had these symptoms for 4 months.   [name] [name] wants appt scheduled for 01/16/22 in the morning time. I scheduled [name] [name] appt with Dr Diona Browner on 01/16/22 at 8:40 with UC & ED precautions which [name] [name] voiced understanding and said if he worsened tonight he would go to [facility] or ED otherwise will keep appt with Dr Diona Browner on 01/16/22. Sending note to Dr Diona Browner who is out of office and Dr Damita Dunnings who is out of office and Butch Penny CMA. ?

## 2022-01-16 ENCOUNTER — Encounter: Payer: Self-pay | Admitting: Family Medicine

## 2022-01-16 ENCOUNTER — Ambulatory Visit (INDEPENDENT_AMBULATORY_CARE_PROVIDER_SITE_OTHER): Payer: Medicare HMO | Admitting: Family Medicine

## 2022-01-16 VITALS — BP 128/82 | HR 73 | Temp 97.6°F | Ht 70.0 in | Wt 166.1 lb

## 2022-01-16 DIAGNOSIS — Z8701 Personal history of pneumonia (recurrent): Secondary | ICD-10-CM

## 2022-01-16 DIAGNOSIS — R059 Cough, unspecified: Secondary | ICD-10-CM

## 2022-01-16 DIAGNOSIS — R062 Wheezing: Secondary | ICD-10-CM | POA: Diagnosis not present

## 2022-01-16 DIAGNOSIS — R0602 Shortness of breath: Secondary | ICD-10-CM

## 2022-01-16 DIAGNOSIS — R0789 Other chest pain: Secondary | ICD-10-CM

## 2022-01-16 MED ORDER — FLUTICASONE-SALMETEROL 250-50 MCG/ACT IN AEPB: 1.0000 | INHALATION_SPRAY | Freq: Two times a day (BID) | RESPIRATORY_TRACT | 3 refills | Status: DC

## 2022-01-16 NOTE — Assessment & Plan Note (Addendum)
Subacute worsening ? ?Following bilateral pneumonia in January he has continued to have intermittent productive cough, wheeze and shortness of breath. ?He has had several courses of antibiotics and steroids.  His symptoms improved temporarily while on this treatment but have returned repeatedly. ? ?He is not in severe respiratory distress today but does have diffuse wheezing and chest tightness.  He states albuterol temporarily improves/reverses symptoms. ?At this point his symptoms appear to be reactive with elements of asthma which she had issues with as a child.  I encouraged him to repeat the course of prednisone, and use albuterol as needed.  At the end of his prednisone taper I have encouraged him to start a trial of Advair 250/50 mcg twice daily. ? ? ?Given the recurrent purulent mucus, bacterial infection is possible. Bronchiectasis is on the differential.  We will plan to repeat a course of Levaquin. ?We discussed the possibility of a chest CT to other evaluate given he cannot see pulmonary until June. ?I will forward this note to his PCP to get his input. ?

## 2022-01-16 NOTE — Progress Notes (Signed)
? ? Patient ID: [name] [name], male    DOB: 03-20-1946, 76 y.o.   MRN: [id number] ? ?This visit was conducted in person. ? ?BP 128/82 (BP Location: Left Arm, Patient Position: Sitting, Cuff Size: Normal)   Pulse 73   Temp 97.6 ?F (36.4 ?C) (Oral)   Ht '5\' 10"'$  (1.778 m)   Wt 166 lb 1.6 oz (75.3 kg)   SpO2 99%   BMI 23.83 kg/m?   ? ?CC: ?Chief Complaint  ?Patient presents with  ? Cough  ?  Wheezing coughing up mucus green and yellow in color  ? ? ?Subjective:  ? ?HPI: ?[name] [name] is a 76 y.o. male  with history of  chidhood asthma, CHF, hx of PNA on 2/2023presenting on 01/16/2022 for Cough (Wheezing coughing up mucus green and yellow in color) ? ? He started with  cold symptoms in mid December. ?Treated with prednisone, doxycycline x 10 days as well as Augmentin  x 10 days ? CXR 1/4//2023  IMPRESSION: ?1. Middle lobe opacities are suspicious for developing ?bronchopneumonia. ?2. Cardiomegaly. ?He reports recovery from PNA in 09/2021 with antibiotics and CXR repeats showed clearing of PNA. Improved symptoms when on antibiotics but return once antibiotics  stopped. ? ?Then at 3/27 wellness visit with PCP he reported cough x 2-3 weeks. ? CXR on 3/27 showed  IMPRESSION: ?Cardiomegaly with cephalization of the pulmonary vasculature as can ?be seen with pulmonary venous hypertension. This is similar to ?prior. ? ?Treated with levaquin  and prednisone, albuterol inhaler. ?Improved temporarily symptoms returned 1 week later. ?  ?New diagnosis of melanoma..  supposed have excision and lymph node removal 02/03/2022 ? ? In last he has noted recurrence of  yellow green productive cough and increase in SOB, SOB an wheeze. ?  PCP called in antibiotics and prednisone repeat but has not picked it up yet. ? Symptoms do improve with using albuterol inhaler.. short term. ? No fever, no body aches. ?No weakness, no flu like symptoms. ? ? He has pending referral to pulmonary for SOB/chronic cough ? Earliest appt he can get is 6/6...  [place]. ?  ?  ? ?  COVID 19 screen ?COVID testing: ?COVID vaccine: primary series ?COVID exposure: No recent travel or known exposure to [place] ? The importance of social distancing was discussed today.  ? ? ?Relevant past medical, surgical, family and social history reviewed and updated as indicated. Interim medical history since our last visit reviewed. ?Allergies and medications reviewed and updated. ?Outpatient Medications Prior to Visit  ?Medication Sig Dispense Refill  ? albuterol (VENTOLIN HFA) 108 (90 Base) MCG/ACT inhaler INHALE ONE TO TWO PUFFS INTO THE LUNGS EVERY SIX HOURS AS NEEDED FOR WHEEZING OR SHORTNESS OF BREATH. FILL WITH PROAIR/ALBUTEROL/VENTOLIN 8.5 each 4  ? apixaban (ELIQUIS) 5 MG TABS tablet Take by mouth.    ? carvedilol (COREG) 25 MG tablet Take 25 mg by mouth 2 (two) times daily with a meal.    ? digoxin (LANOXIN) 0.125 MG tablet Take 0.125 mg by mouth daily.    ? doxazosin (CARDURA) 4 MG tablet Take 1 tablet (4 mg total) by mouth at bedtime. 90 tablet 3  ? ENTRESTO 24-26 MG Take 1 tablet by mouth 2 (two) times daily.    ? fish oil-omega-3 fatty acids 1000 MG capsule Take 1 g by mouth daily.    ? fluticasone (FLONASE) 50 MCG/ACT nasal spray Place 2 sprays into both nostrils daily. 16 g 6  ? furosemide (LASIX) 20 MG tablet  Take 2 tablets (40 mg total) by mouth daily as needed.    ? levofloxacin (LEVAQUIN) 750 MG tablet Take 1 tablet (750 mg total) by mouth daily. 7 tablet 0  ? levothyroxine (SYNTHROID) 137 MCG tablet TAKE 1 TABLET BY MOUTH ONCE DAILY BEFORE BREAKFAST 90 tablet 3  ? Melatonin 5 MG CAPS Take 5 mg by mouth daily.     ? Multiple Vitamin (MULTIVITAMIN) tablet Take 1 tablet by mouth daily.    ? pravastatin (PRAVACHOL) 40 MG tablet Take 1 tablet (40 mg total) by mouth daily. 90 tablet 3  ? predniSONE (DELTASONE) 10 MG tablet Take 2 a day for 5 days, then 1 a day for 5 days, with food. Don't take with aleve/ibuprofen. 15 tablet 0  ? sildenafil (VIAGRA) 100 MG tablet Take 100  mg by mouth daily as needed.    ? spironolactone (ALDACTONE) 25 MG tablet TAKE 1 TABLET BY MOUTH ONCE DAILY    ? ?No facility-administered medications prior to visit.  ?  ? ?Per HPI unless specifically indicated in ROS section below ?Review of Systems  ?Constitutional:  Negative for fatigue and fever.  ?HENT:  Negative for congestion, ear pain, postnasal drip, sinus pain and sore throat.   ?Respiratory:  Positive for cough, chest tightness, shortness of breath and wheezing.   ?Cardiovascular:  Negative for chest pain and leg swelling.  ?Objective:  ?BP 128/82 (BP Location: Left Arm, Patient Position: Sitting, Cuff Size: Normal)   Pulse 73   Temp 97.6 ?F (36.4 ?C) (Oral)   Ht '5\' 10"'$  (1.778 m)   Wt 166 lb 1.6 oz (75.3 kg)   SpO2 99%   BMI 23.83 kg/m?   ?Wt Readings from Last 3 Encounters:  ?01/16/22 166 lb 1.6 oz (75.3 kg)  ?12/22/21 169 lb (76.7 kg)  ?11/21/21 168 lb (76.2 kg)  ?  ?  ?Physical Exam ?Constitutional:   ?   Appearance: He is well-developed.  ?HENT:  ?   Head: Normocephalic.  ?   Right Ear: Hearing normal.  ?   Left Ear: Hearing normal.  ?   Nose: Nose normal.  ?Neck:  ?   Thyroid: No thyroid mass or thyromegaly.  ?   Vascular: No carotid bruit.  ?   Trachea: Trachea normal.  ?Cardiovascular:  ?   Rate and Rhythm: Normal rate and regular rhythm.  ?   Pulses: Normal pulses.  ?   Heart sounds: Heart sounds not distant. No murmur heard. ?  No friction rub. No gallop.  ?   Comments: No peripheral edema ?Pulmonary:  ?   Effort: Pulmonary effort is normal. No respiratory distress.  ?   Breath sounds: Examination of the right-upper field reveals wheezing. Examination of the left-upper field reveals wheezing. Examination of the right-middle field reveals wheezing. Examination of the left-middle field reveals wheezing. Examination of the right-lower field reveals wheezing. Examination of the left-lower field reveals wheezing. Wheezing present. No decreased breath sounds, rhonchi or rales.  ?Skin: ?    General: Skin is warm and dry.  ?   Findings: No rash.  ?Psychiatric:     ?   Speech: Speech normal.     ?   Behavior: Behavior normal.     ?   Thought Content: Thought content normal.  ? ?   ?Results for orders placed or performed in visit on 12/16/21  ?Digoxin level  ?Result Value Ref Range  ? Digoxin Level 0.8 0.8 - 2.0 mcg/L  ?TSH  ?Result Value Ref  Range  ? TSH 2.09 0.35 - 5.50 uIU/mL  ?Lipid panel  ?Result Value Ref Range  ? Cholesterol 156 0 - 200 mg/dL  ? Triglycerides 69.0 0.0 - 149.0 mg/dL  ? HDL 57.50 >39.00 mg/dL  ? VLDL 13.8 0.0 - 40.0 mg/dL  ? LDL Cholesterol 84 0 - 99 mg/dL  ? Total CHOL/HDL Ratio 3   ? NonHDL 98.17   ?CBC with Differential/Platelet  ?Result Value Ref Range  ? WBC 6.6 4.0 - 10.5 K/uL  ? RBC 4.42 4.22 - 5.81 Mil/uL  ? Hemoglobin 14.0 13.0 - 17.0 g/dL  ? HCT 41.2 39.0 - 52.0 %  ? MCV 93.3 78.0 - 100.0 fl  ? MCHC 33.9 30.0 - 36.0 g/dL  ? RDW 15.5 11.5 - 15.5 %  ? Platelets 117.0 (L) 150.0 - 400.0 K/uL  ? Neutrophils Relative % 62.4 43.0 - 77.0 %  ? Lymphocytes Relative 19.9 12.0 - 46.0 %  ? Monocytes Relative 7.7 3.0 - 12.0 %  ? Eosinophils Relative 8.6 (H) 0.0 - 5.0 %  ? Basophils Relative 1.4 0.0 - 3.0 %  ? Neutro Abs 4.1 1.4 - 7.7 K/uL  ? Lymphs Abs 1.3 0.7 - 4.0 K/uL  ? Monocytes Absolute 0.5 0.1 - 1.0 K/uL  ? Eosinophils Absolute 0.6 0.0 - 0.7 K/uL  ? Basophils Absolute 0.1 0.0 - 0.1 K/uL  ?Comprehensive metabolic panel  ?Result Value Ref Range  ? Sodium 139 135 - 145 mEq/L  ? Potassium 4.7 3.5 - 5.1 mEq/L  ? Chloride 102 96 - 112 mEq/L  ? CO2 31 19 - 32 mEq/L  ? Glucose, Bld 110 (H) 70 - 99 mg/dL  ? BUN 19 6 - 23 mg/dL  ? Creatinine, Ser 1.17 0.40 - 1.50 mg/dL  ? Total Bilirubin 2.0 (H) 0.2 - 1.2 mg/dL  ? Alkaline Phosphatase 42 39 - 117 U/L  ? AST 13 0 - 37 U/L  ? ALT 14 0 - 53 U/L  ? Total Protein 6.3 6.0 - 8.3 g/dL  ? Albumin 4.2 3.5 - 5.2 g/dL  ? GFR 61.07 >60.00 mL/min  ? Calcium 9.2 8.4 - 10.5 mg/dL  ? ? ?This visit occurred during the SARS-CoV-2 public health emergency.   Safety protocols were in place, including screening questions prior to the visit, additional usage of staff PPE, and extensive cleaning of exam room while observing appropriate contact time as indicated for disinfecting solut

## 2022-01-16 NOTE — Patient Instructions (Signed)
Start and complete antibiotic and prednisone course. ? Toward end of prednisone course start a trial of Advair. ? I will discuss CT scan with Dr. Damita Dunnings as well as we will try to see if we can move the pulmonary appointment sooner. ?

## 2022-01-20 NOTE — Telephone Encounter (Signed)
Noted. Thanks.

## 2022-01-21 ENCOUNTER — Telehealth: Payer: Self-pay | Admitting: Family Medicine

## 2022-01-21 DIAGNOSIS — R059 Cough, unspecified: Secondary | ICD-10-CM

## 2022-01-21 NOTE — Telephone Encounter (Signed)
Called and spoke with patient and he is okay to do CT and new pulm referral.  ?

## 2022-01-21 NOTE — Telephone Encounter (Signed)
Noted. Thanks.

## 2022-01-21 NOTE — Telephone Encounter (Signed)
Please call pt.  I talked with Dr. Diona Browner about his situation. I want to move his pulmonary appointment sooner so I put in a new referral.  And I would like to check a CT of his chest.  I put in the order for that too.  Please let me know if patient disagrees.  I preemptively signed the orders.   ? ?I think it would be good to get the CT prior to seeing pulmonary.  Thanks.   ? ?

## 2022-01-29 DIAGNOSIS — I426 Alcoholic cardiomyopathy: Secondary | ICD-10-CM | POA: Diagnosis not present

## 2022-01-29 DIAGNOSIS — Z79899 Other long term (current) drug therapy: Secondary | ICD-10-CM | POA: Diagnosis not present

## 2022-01-29 DIAGNOSIS — E039 Hypothyroidism, unspecified: Secondary | ICD-10-CM | POA: Diagnosis not present

## 2022-01-29 DIAGNOSIS — I4811 Longstanding persistent atrial fibrillation: Secondary | ICD-10-CM | POA: Diagnosis not present

## 2022-01-29 DIAGNOSIS — K7030 Alcoholic cirrhosis of liver without ascites: Secondary | ICD-10-CM | POA: Diagnosis not present

## 2022-01-29 DIAGNOSIS — Z01818 Encounter for other preprocedural examination: Secondary | ICD-10-CM | POA: Diagnosis not present

## 2022-01-29 DIAGNOSIS — C434 Malignant melanoma of scalp and neck: Secondary | ICD-10-CM | POA: Diagnosis not present

## 2022-01-29 DIAGNOSIS — R69 Illness, unspecified: Secondary | ICD-10-CM | POA: Diagnosis not present

## 2022-01-29 DIAGNOSIS — I10 Essential (primary) hypertension: Secondary | ICD-10-CM | POA: Diagnosis not present

## 2022-01-30 ENCOUNTER — Ambulatory Visit: Payer: Medicare HMO | Admitting: Family Medicine

## 2022-02-03 DIAGNOSIS — K7030 Alcoholic cirrhosis of liver without ascites: Secondary | ICD-10-CM | POA: Diagnosis not present

## 2022-02-03 DIAGNOSIS — R2233 Localized swelling, mass and lump, upper limb, bilateral: Secondary | ICD-10-CM | POA: Diagnosis not present

## 2022-02-03 DIAGNOSIS — I110 Hypertensive heart disease with heart failure: Secondary | ICD-10-CM | POA: Diagnosis not present

## 2022-02-03 DIAGNOSIS — I4811 Longstanding persistent atrial fibrillation: Secondary | ICD-10-CM | POA: Diagnosis not present

## 2022-02-03 DIAGNOSIS — E039 Hypothyroidism, unspecified: Secondary | ICD-10-CM | POA: Diagnosis not present

## 2022-02-03 DIAGNOSIS — C434 Malignant melanoma of scalp and neck: Secondary | ICD-10-CM | POA: Diagnosis not present

## 2022-02-03 DIAGNOSIS — E7800 Pure hypercholesterolemia, unspecified: Secondary | ICD-10-CM | POA: Diagnosis not present

## 2022-02-03 DIAGNOSIS — R69 Illness, unspecified: Secondary | ICD-10-CM | POA: Diagnosis not present

## 2022-02-03 DIAGNOSIS — I426 Alcoholic cardiomyopathy: Secondary | ICD-10-CM | POA: Diagnosis not present

## 2022-02-11 DIAGNOSIS — D0439 Carcinoma in situ of skin of other parts of face: Secondary | ICD-10-CM | POA: Diagnosis not present

## 2022-02-11 DIAGNOSIS — C44329 Squamous cell carcinoma of skin of other parts of face: Secondary | ICD-10-CM | POA: Diagnosis not present

## 2022-02-18 DIAGNOSIS — C434 Malignant melanoma of scalp and neck: Secondary | ICD-10-CM | POA: Diagnosis not present

## 2022-02-26 DIAGNOSIS — C434 Malignant melanoma of scalp and neck: Secondary | ICD-10-CM | POA: Diagnosis not present

## 2022-03-03 ENCOUNTER — Institutional Professional Consult (permissible substitution): Payer: Medicare HMO | Admitting: Pulmonary Disease

## 2022-03-11 ENCOUNTER — Encounter: Payer: Self-pay | Admitting: *Deleted

## 2022-03-20 IMAGING — DX DG CHEST 2V
2 series · 2 of 2 positions shown · non-contrast
Comparison: Radiograph 10/30/2021, 10/10/2021, additional priors

CLINICAL DATA: Follow-up pneumonia.  Persistent cough.

EXAM:
CHEST - 2 VIEW

[chest pa]
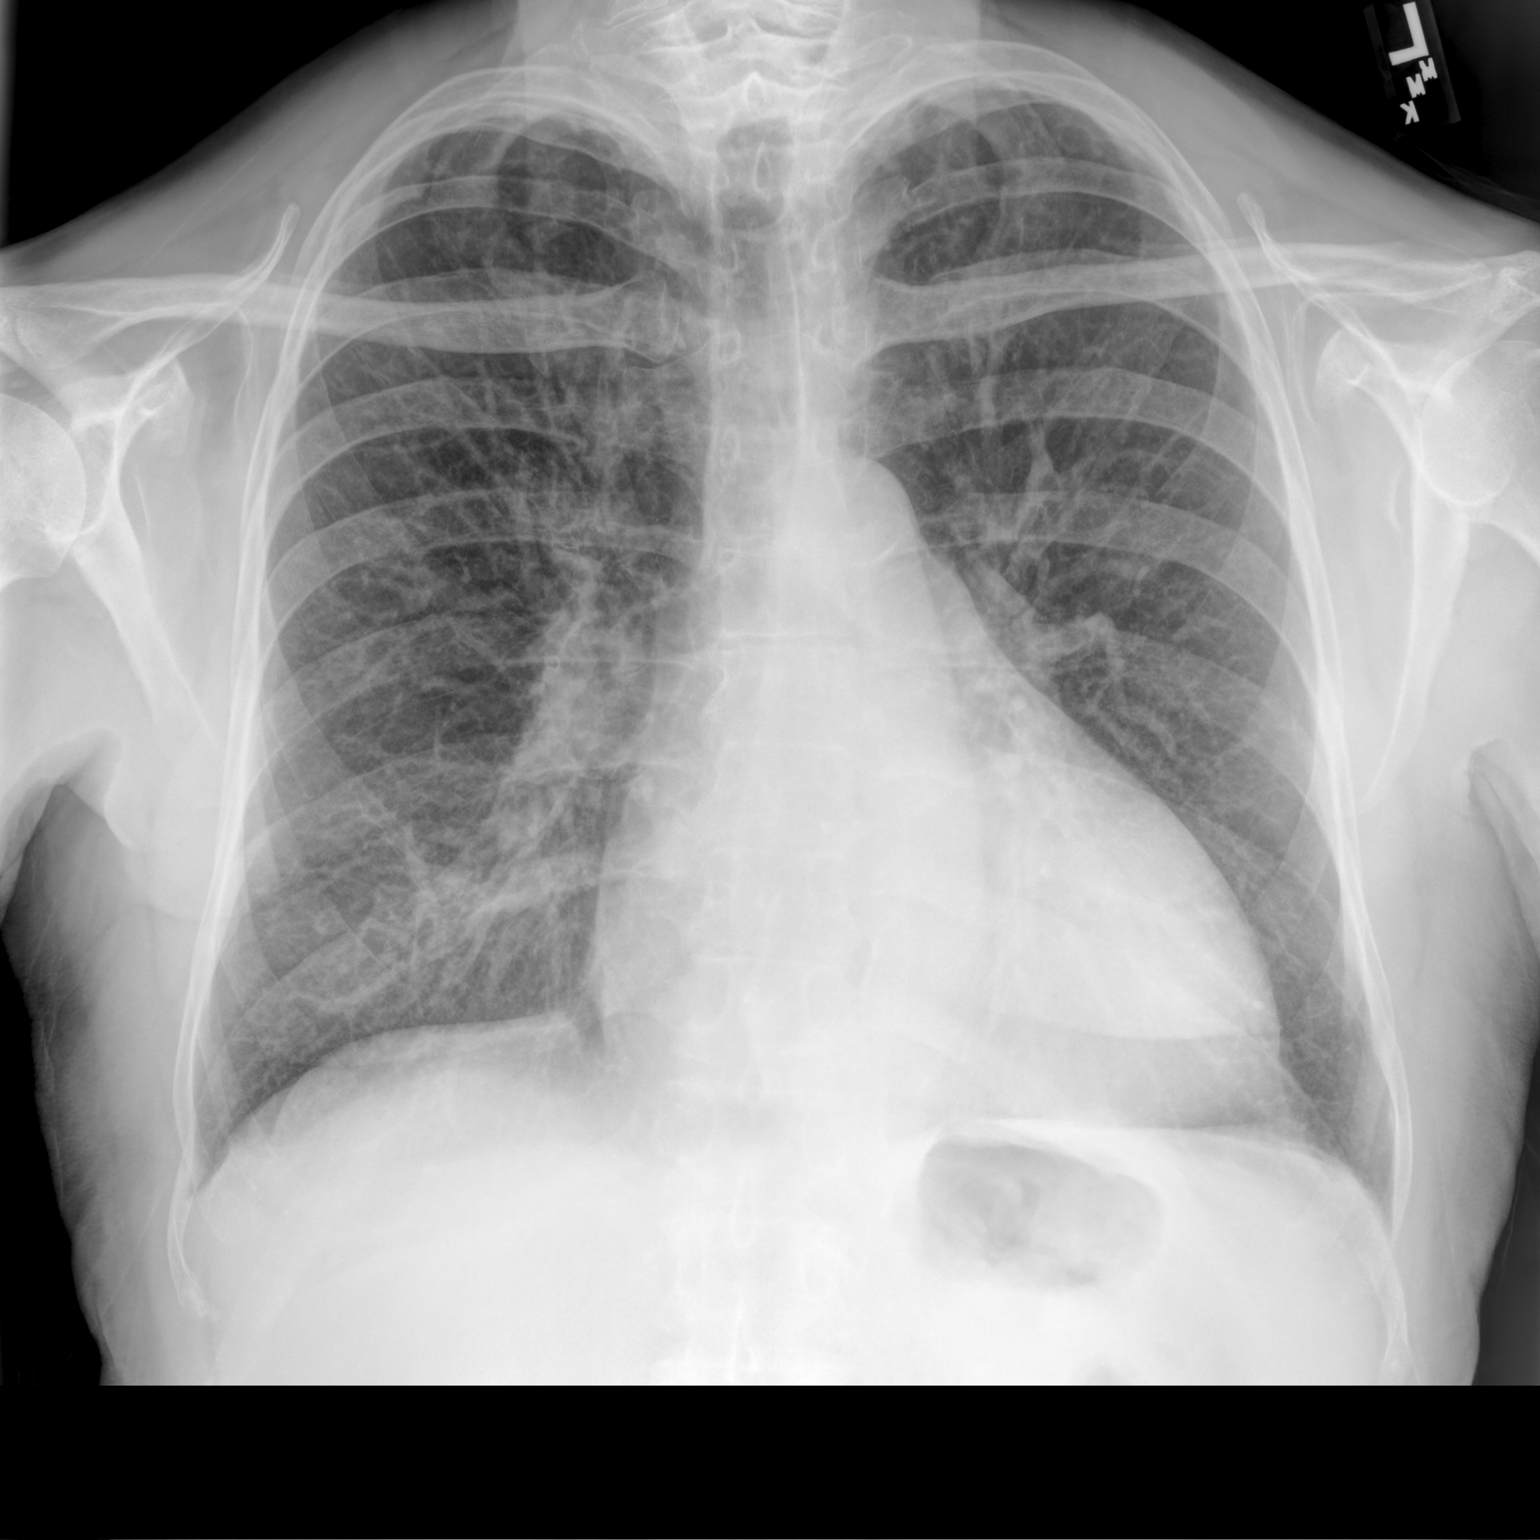

[chest lat]
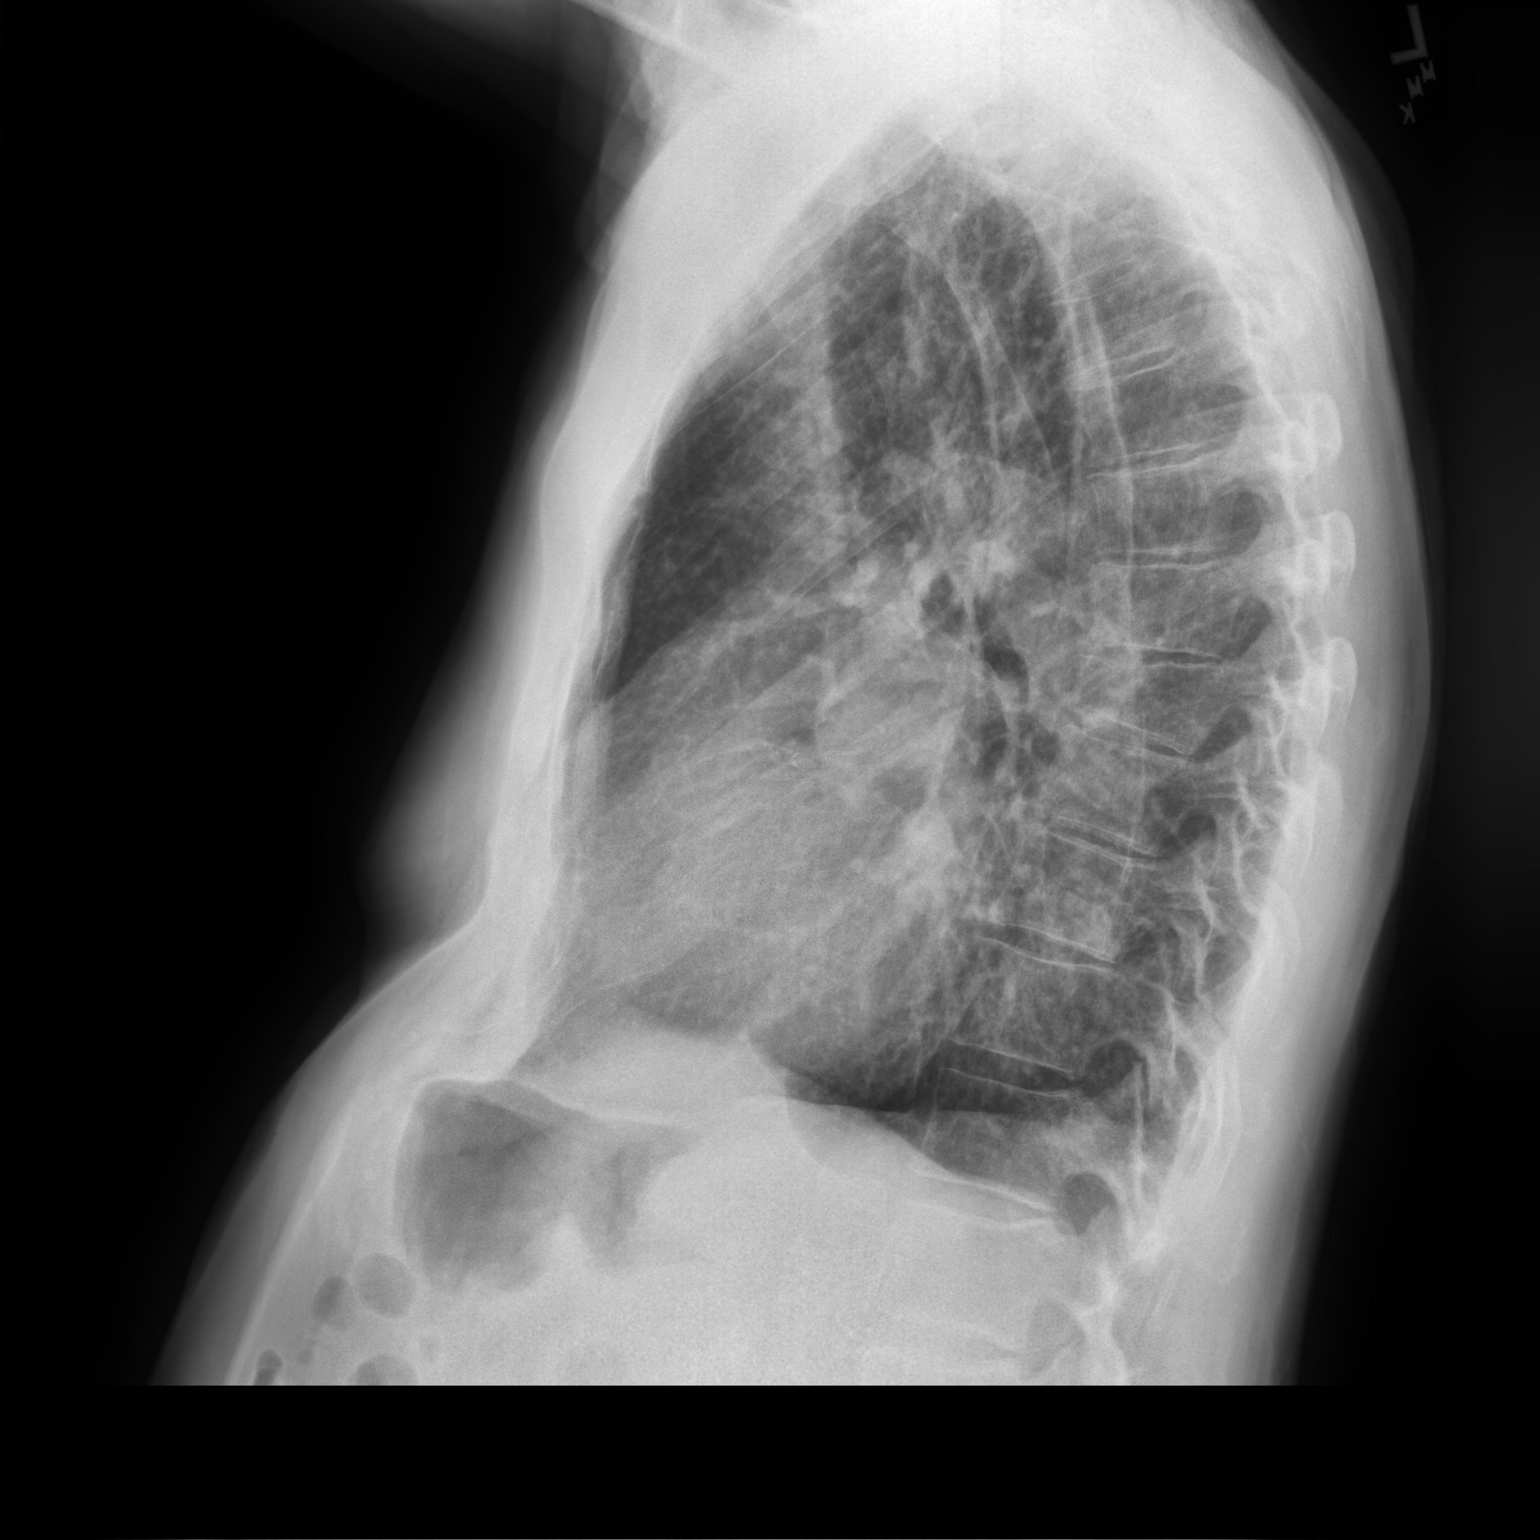

[2 of 2 positions shown; findings below may reference images not displayed]

FINDINGS: Chronic cardiomegaly, unchanged from prior exam. Stable mediastinal
contours with aortic atherosclerosis. Vascular congestion without
findings of pulmonary edema. Previous right middle lobe opacities
have resolved. No new or progressive airspace disease. Similar
biapical pleuroparenchymal scarring. No significant pleural
effusion. No pneumothorax. Chronic midthoracic compression
deformity.
IMPRESSION: 1. Chronic cardiomegaly with unchanged vascular congestion.
2. Resolved right middle lobe opacity.

## 2022-03-27 DIAGNOSIS — R69 Illness, unspecified: Secondary | ICD-10-CM | POA: Diagnosis not present

## 2022-03-27 DIAGNOSIS — R0602 Shortness of breath: Secondary | ICD-10-CM | POA: Diagnosis not present

## 2022-03-27 DIAGNOSIS — I480 Paroxysmal atrial fibrillation: Secondary | ICD-10-CM | POA: Diagnosis not present

## 2022-04-03 DIAGNOSIS — I10 Essential (primary) hypertension: Secondary | ICD-10-CM | POA: Diagnosis not present

## 2022-04-03 DIAGNOSIS — I480 Paroxysmal atrial fibrillation: Secondary | ICD-10-CM | POA: Diagnosis not present

## 2022-04-03 DIAGNOSIS — I5020 Unspecified systolic (congestive) heart failure: Secondary | ICD-10-CM | POA: Diagnosis not present

## 2022-04-03 DIAGNOSIS — E782 Mixed hyperlipidemia: Secondary | ICD-10-CM | POA: Diagnosis not present

## 2022-05-12 ENCOUNTER — Institutional Professional Consult (permissible substitution): Payer: Medicare HMO | Admitting: Pulmonary Disease

## 2022-05-20 DIAGNOSIS — D2261 Melanocytic nevi of right upper limb, including shoulder: Secondary | ICD-10-CM | POA: Diagnosis not present

## 2022-05-20 DIAGNOSIS — D045 Carcinoma in situ of skin of trunk: Secondary | ICD-10-CM | POA: Diagnosis not present

## 2022-05-20 DIAGNOSIS — Z85820 Personal history of malignant melanoma of skin: Secondary | ICD-10-CM | POA: Diagnosis not present

## 2022-05-20 DIAGNOSIS — D225 Melanocytic nevi of trunk: Secondary | ICD-10-CM | POA: Diagnosis not present

## 2022-05-20 DIAGNOSIS — Z85828 Personal history of other malignant neoplasm of skin: Secondary | ICD-10-CM | POA: Diagnosis not present

## 2022-05-20 DIAGNOSIS — L538 Other specified erythematous conditions: Secondary | ICD-10-CM | POA: Diagnosis not present

## 2022-05-20 DIAGNOSIS — D485 Neoplasm of uncertain behavior of skin: Secondary | ICD-10-CM | POA: Diagnosis not present

## 2022-05-20 DIAGNOSIS — L570 Actinic keratosis: Secondary | ICD-10-CM | POA: Diagnosis not present

## 2022-05-20 DIAGNOSIS — D0461 Carcinoma in situ of skin of right upper limb, including shoulder: Secondary | ICD-10-CM | POA: Diagnosis not present

## 2022-06-03 DIAGNOSIS — D0461 Carcinoma in situ of skin of right upper limb, including shoulder: Secondary | ICD-10-CM | POA: Diagnosis not present

## 2022-06-03 DIAGNOSIS — D045 Carcinoma in situ of skin of trunk: Secondary | ICD-10-CM | POA: Diagnosis not present

## 2022-06-25 ENCOUNTER — Telehealth: Payer: Self-pay | Admitting: Family Medicine

## 2022-06-25 NOTE — Telephone Encounter (Signed)
I spoke with pt he stated he did not need the CT chest at this time. I advised to pt that the auth is good until Dec. If he needed it to give [place] a call. Pt agreed. thanks

## 2022-09-09 DIAGNOSIS — H52223 Regular astigmatism, bilateral: Secondary | ICD-10-CM | POA: Diagnosis not present

## 2022-09-09 DIAGNOSIS — H35372 Puckering of macula, left eye: Secondary | ICD-10-CM | POA: Diagnosis not present

## 2022-09-09 DIAGNOSIS — H5203 Hypermetropia, bilateral: Secondary | ICD-10-CM | POA: Diagnosis not present

## 2022-09-09 DIAGNOSIS — H353131 Nonexudative age-related macular degeneration, bilateral, early dry stage: Secondary | ICD-10-CM | POA: Diagnosis not present

## 2022-09-09 DIAGNOSIS — H524 Presbyopia: Secondary | ICD-10-CM | POA: Diagnosis not present

## 2022-09-09 DIAGNOSIS — H2513 Age-related nuclear cataract, bilateral: Secondary | ICD-10-CM | POA: Diagnosis not present

## 2022-10-08 DIAGNOSIS — I426 Alcoholic cardiomyopathy: Secondary | ICD-10-CM | POA: Diagnosis not present

## 2022-10-08 DIAGNOSIS — I4821 Permanent atrial fibrillation: Secondary | ICD-10-CM | POA: Diagnosis not present

## 2022-10-08 DIAGNOSIS — I10 Essential (primary) hypertension: Secondary | ICD-10-CM | POA: Diagnosis not present

## 2022-10-08 DIAGNOSIS — I5022 Chronic systolic (congestive) heart failure: Secondary | ICD-10-CM | POA: Diagnosis not present

## 2022-10-08 DIAGNOSIS — E7800 Pure hypercholesterolemia, unspecified: Secondary | ICD-10-CM | POA: Diagnosis not present

## 2022-10-08 DIAGNOSIS — J449 Chronic obstructive pulmonary disease, unspecified: Secondary | ICD-10-CM | POA: Diagnosis not present

## 2022-11-11 ENCOUNTER — Telehealth: Payer: Self-pay | Admitting: Family Medicine

## 2022-11-11 NOTE — Telephone Encounter (Signed)
LVM for pt to rtn my call to schedule AWV with [facility] call back # [phone] 

## 2022-11-17 ENCOUNTER — Telehealth: Payer: Self-pay | Admitting: Family Medicine

## 2022-11-17 NOTE — Telephone Encounter (Signed)
[place] to schedule their annual wellness visit. Appointment made for 12/24/2022.  [place] [employer] Dial: [phone]

## 2022-11-25 DIAGNOSIS — Z85828 Personal history of other malignant neoplasm of skin: Secondary | ICD-10-CM | POA: Diagnosis not present

## 2022-11-25 DIAGNOSIS — L570 Actinic keratosis: Secondary | ICD-10-CM | POA: Diagnosis not present

## 2022-11-25 DIAGNOSIS — D045 Carcinoma in situ of skin of trunk: Secondary | ICD-10-CM | POA: Diagnosis not present

## 2022-11-25 DIAGNOSIS — D225 Melanocytic nevi of trunk: Secondary | ICD-10-CM | POA: Diagnosis not present

## 2022-11-25 DIAGNOSIS — D2262 Melanocytic nevi of left upper limb, including shoulder: Secondary | ICD-10-CM | POA: Diagnosis not present

## 2022-11-25 DIAGNOSIS — D2261 Melanocytic nevi of right upper limb, including shoulder: Secondary | ICD-10-CM | POA: Diagnosis not present

## 2022-11-25 DIAGNOSIS — L821 Other seborrheic keratosis: Secondary | ICD-10-CM | POA: Diagnosis not present

## 2022-11-25 DIAGNOSIS — D0439 Carcinoma in situ of skin of other parts of face: Secondary | ICD-10-CM | POA: Diagnosis not present

## 2022-11-25 DIAGNOSIS — D2272 Melanocytic nevi of left lower limb, including hip: Secondary | ICD-10-CM | POA: Diagnosis not present

## 2022-11-25 DIAGNOSIS — Z85820 Personal history of malignant melanoma of skin: Secondary | ICD-10-CM | POA: Diagnosis not present

## 2022-11-25 DIAGNOSIS — D485 Neoplasm of uncertain behavior of skin: Secondary | ICD-10-CM | POA: Diagnosis not present

## 2022-12-03 DIAGNOSIS — D0439 Carcinoma in situ of skin of other parts of face: Secondary | ICD-10-CM | POA: Diagnosis not present

## 2022-12-09 DIAGNOSIS — D045 Carcinoma in situ of skin of trunk: Secondary | ICD-10-CM | POA: Diagnosis not present

## 2022-12-18 ENCOUNTER — Other Ambulatory Visit: Payer: Self-pay | Admitting: Family Medicine

## 2022-12-18 DIAGNOSIS — E039 Hypothyroidism, unspecified: Secondary | ICD-10-CM

## 2022-12-18 DIAGNOSIS — E7800 Pure hypercholesterolemia, unspecified: Secondary | ICD-10-CM

## 2022-12-18 DIAGNOSIS — I4891 Unspecified atrial fibrillation: Secondary | ICD-10-CM

## 2022-12-24 ENCOUNTER — Other Ambulatory Visit (INDEPENDENT_AMBULATORY_CARE_PROVIDER_SITE_OTHER): Payer: HMO

## 2022-12-24 ENCOUNTER — Ambulatory Visit (INDEPENDENT_AMBULATORY_CARE_PROVIDER_SITE_OTHER): Payer: HMO

## 2022-12-24 VITALS — Ht 70.0 in | Wt 165.0 lb

## 2022-12-24 DIAGNOSIS — E039 Hypothyroidism, unspecified: Secondary | ICD-10-CM

## 2022-12-24 DIAGNOSIS — E7800 Pure hypercholesterolemia, unspecified: Secondary | ICD-10-CM

## 2022-12-24 DIAGNOSIS — I4891 Unspecified atrial fibrillation: Secondary | ICD-10-CM | POA: Diagnosis not present

## 2022-12-24 DIAGNOSIS — Z0000 Encounter for general adult medical examination without abnormal findings: Secondary | ICD-10-CM

## 2022-12-24 LAB — CBC WITH DIFFERENTIAL/PLATELET
Basophils Absolute: 0.1 10*3/uL (ref 0.0–0.1)
Basophils Relative: 1.1 % (ref 0.0–3.0)
Eosinophils Absolute: 0.2 10*3/uL (ref 0.0–0.7)
Eosinophils Relative: 3.7 % (ref 0.0–5.0)
HCT: 39.1 % (ref 39.0–52.0)
Hemoglobin: 13.1 g/dL (ref 13.0–17.0)
Lymphocytes Relative: 21.8 % (ref 12.0–46.0)
Lymphs Abs: 1.3 10*3/uL (ref 0.7–4.0)
MCHC: 33.5 g/dL (ref 30.0–36.0)
MCV: 97.8 fl (ref 78.0–100.0)
Monocytes Absolute: 0.5 10*3/uL (ref 0.1–1.0)
Monocytes Relative: 9.4 % (ref 3.0–12.0)
Neutro Abs: 3.7 10*3/uL (ref 1.4–7.7)
Neutrophils Relative %: 64 % (ref 43.0–77.0)
Platelets: 109 10*3/uL — ABNORMAL LOW (ref 150.0–400.0)
RBC: 4 Mil/uL — ABNORMAL LOW (ref 4.22–5.81)
RDW: 14 % (ref 11.5–15.5)
WBC: 5.7 10*3/uL (ref 4.0–10.5)

## 2022-12-24 LAB — COMPREHENSIVE METABOLIC PANEL
ALT: 13 U/L (ref 0–53)
AST: 15 U/L (ref 0–37)
Albumin: 4.2 g/dL (ref 3.5–5.2)
Alkaline Phosphatase: 51 U/L (ref 39–117)
BUN: 20 mg/dL (ref 6–23)
CO2: 29 mEq/L (ref 19–32)
Calcium: 9.1 mg/dL (ref 8.4–10.5)
Chloride: 102 mEq/L (ref 96–112)
Creatinine, Ser: 1.32 mg/dL (ref 0.40–1.50)
GFR: 52.46 mL/min — ABNORMAL LOW (ref >60.00)
Glucose, Bld: 89 mg/dL (ref 70–99)
Potassium: 4.7 mEq/L (ref 3.5–5.1)
Sodium: 138 mEq/L (ref 135–145)
Total Bilirubin: 1.6 mg/dL — ABNORMAL HIGH (ref 0.2–1.2)
Total Protein: 6.5 g/dL (ref 6.0–8.3)

## 2022-12-24 LAB — LIPID PANEL
Cholesterol: 143 mg/dL (ref 0–200)
HDL: 58.8 mg/dL (ref >39.00)
LDL Cholesterol: 69 mg/dL (ref 0–99)
NonHDL: 84.47
Total CHOL/HDL Ratio: 2
Triglycerides: 78 mg/dL (ref 0.0–149.0)
VLDL: 15.6 mg/dL (ref 0.0–40.0)

## 2022-12-24 LAB — TSH: TSH: 4.98 u[IU]/mL (ref 0.35–5.50)

## 2022-12-24 NOTE — Patient Instructions (Addendum)
[name] [name] , Thank you for taking time to come for your Medicare Wellness Visit. I appreciate your ongoing commitment to your health goals. Please review the following plan we discussed and let me know if I can assist you in the future.   These are the goals we discussed:  Goals      My goal for 2024 is to keep breathing.        This is a list of the screening recommended for you and due dates:  Health Maintenance  Topic Date Due   Flu Shot  04/28/2022   COVID-19 Vaccine (7 - 2023-24 season) 08/08/2022   Medicare Annual Wellness Visit  12/24/2023   DTaP/Tdap/Td vaccine (4 - Td or Tdap) 09/28/2029   Pneumonia Vaccine  Completed   Hepatitis C Screening: USPSTF Recommendation to screen - Ages 52-79 yo.  Completed   Zoster (Shingles) Vaccine  Completed   HPV Vaccine  Aged Out   Cologuard (Stool DNA test)  Discontinued    Advanced directives: Yes  Conditions/risks identified: Yes  Next appointment: Follow up in one year for your annual wellness visit.   Preventive Care 77 Years and Older, Male  Preventive care refers to lifestyle choices and visits with your health care provider that can promote health and wellness. What does preventive care include? A yearly physical exam. This is also called an annual well check. Dental exams once or twice a year. Routine eye exams. Ask your health care provider how often you should have your eyes checked. Personal lifestyle choices, including: Daily care of your teeth and gums. Regular physical activity. Eating a healthy diet. Avoiding tobacco and drug use. Limiting alcohol use. Practicing safe sex. Taking low doses of aspirin every day. Taking vitamin and mineral supplements as recommended by your health care provider. What happens during an annual well check? The services and screenings done by your health care provider during your annual well check will depend on your age, overall health, lifestyle risk factors, and family history of  disease. Counseling  Your health care provider may ask you questions about your: Alcohol use. Tobacco use. Drug use. Emotional well-being. Home and relationship well-being. Sexual activity. Eating habits. History of falls. Memory and ability to understand (cognition). Work and work Statistician. Screening  You may have the following tests or measurements: Height, weight, and BMI. Blood pressure. Lipid and cholesterol levels. These may be checked every 5 years, or more frequently if you are over 42 years old. Skin check. Lung cancer screening. You may have this screening every year starting at age 46 if you have a 30-pack-year history of smoking and currently smoke or have quit within the past 15 years. Fecal occult blood test (FOBT) of the stool. You may have this test every year starting at age 77. Flexible sigmoidoscopy or colonoscopy. You may have a sigmoidoscopy every 5 years or a colonoscopy every 10 years starting at age 77. Prostate cancer screening. Recommendations will vary depending on your family history and other risks. Hepatitis C blood test. Hepatitis B blood test. Sexually transmitted disease (STD) testing. Diabetes screening. This is done by checking your blood sugar (glucose) after you have not eaten for a while (fasting). You may have this done every 1-3 years. Abdominal aortic aneurysm (AAA) screening. You may need this if you are a current or former smoker. Osteoporosis. You may be screened starting at age 3 if you are at high risk. Talk with your health care provider about your test results, treatment options,  and if necessary, the need for more tests. Vaccines  Your health care provider may recommend certain vaccines, such as: Influenza vaccine. This is recommended every year. Tetanus, diphtheria, and acellular pertussis (Tdap, Td) vaccine. You may need a Td booster every 10 years. Zoster vaccine. You may need this after age 7. Pneumococcal 13-valent  conjugate (PCV13) vaccine. One dose is recommended after age 6. Pneumococcal polysaccharide (PPSV23) vaccine. One dose is recommended after age 62. Talk to your health care provider about which screenings and vaccines you need and how often you need them. This information is not intended to replace advice given to you by your health care provider. Make sure you discuss any questions you have with your health care provider. Document Released: 10/11/2015 Document Revised: 06/03/2016 Document Reviewed: 07/16/2015 Elsevier Interactive Patient Education  2017 [place] Prevention in the Home Falls can cause injuries. They can happen to people of all ages. There are many things you can do to make your home safe and to help prevent falls. What can I do on the outside of my home? Regularly fix the edges of walkways and driveways and fix any cracks. Remove anything that might make you trip as you walk through a door, such as a raised step or threshold. Trim any bushes or trees on the path to your home. Use bright outdoor lighting. Clear any walking paths of anything that might make someone trip, such as rocks or tools. Regularly check to see if handrails are loose or broken. Make sure that both sides of any steps have handrails. Any raised decks and porches should have guardrails on the edges. Have any leaves, snow, or ice cleared regularly. Use sand or salt on walking paths during winter. Clean up any spills in your garage right away. This includes oil or grease spills. What can I do in the bathroom? Use night lights. Install grab bars by the toilet and in the tub and shower. Do not use towel bars as grab bars. Use non-skid mats or decals in the tub or shower. If you need to sit down in the shower, use a plastic, non-slip stool. Keep the floor dry. Clean up any water that spills on the floor as soon as it happens. Remove soap buildup in the tub or shower regularly. Attach bath mats  securely with double-sided non-slip rug tape. Do not have throw rugs and other things on the floor that can make you trip. What can I do in the bedroom? Use night lights. Make sure that you have a light by your bed that is easy to reach. Do not use any sheets or blankets that are too big for your bed. They should not hang down onto the floor. Have a firm chair that has side arms. You can use this for support while you get dressed. Do not have throw rugs and other things on the floor that can make you trip. What can I do in the kitchen? Clean up any spills right away. Avoid walking on wet floors. Keep items that you use a lot in easy-to-reach places. If you need to reach something above you, use a strong step stool that has a grab bar. Keep electrical cords out of the way. Do not use floor polish or wax that makes floors slippery. If you must use wax, use non-skid floor wax. Do not have throw rugs and other things on the floor that can make you trip. What can I do with my stairs? Do not leave  any items on the stairs. Make sure that there are handrails on both sides of the stairs and use them. Fix handrails that are broken or loose. Make sure that handrails are as long as the stairways. Check any carpeting to make sure that it is firmly attached to the stairs. Fix any carpet that is loose or worn. Avoid having throw rugs at the top or bottom of the stairs. If you do have throw rugs, attach them to the floor with carpet tape. Make sure that you have a light switch at the top of the stairs and the bottom of the stairs. If you do not have them, ask someone to add them for you. What else can I do to help prevent falls? Wear shoes that: Do not have high heels. Have rubber bottoms. Are comfortable and fit you well. Are closed at the toe. Do not wear sandals. If you use a stepladder: Make sure that it is fully opened. Do not climb a closed stepladder. Make sure that both sides of the stepladder  are locked into place. Ask someone to hold it for you, if possible. Clearly mark and make sure that you can see: Any grab bars or handrails. First and last steps. Where the edge of each step is. Use tools that help you move around (mobility aids) if they are needed. These include: Canes. Walkers. Scooters. Crutches. Turn on the lights when you go into a dark area. Replace any light bulbs as soon as they burn out. Set up your furniture so you have a clear path. Avoid moving your furniture around. If any of your floors are uneven, fix them. If there are any pets around you, be aware of where they are. Review your medicines with your doctor. Some medicines can make you feel dizzy. This can increase your chance of falling. Ask your doctor what other things that you can do to help prevent falls. This information is not intended to replace advice given to you by your health care provider. Make sure you discuss any questions you have with your health care provider. Document Released: 07/11/2009 Document Revised: 02/20/2016 Document Reviewed: 10/19/2014 Elsevier Interactive Patient Education  2017 [employer].

## 2022-12-24 NOTE — Progress Notes (Addendum)
I connected with  [name] [name] on 12/24/22 by a audio enabled telemedicine application and verified that I am speaking with the correct person using two identifiers.  Patient Location: Home  Provider Location: Office/Clinic  I discussed the limitations of evaluation and management by telemedicine. The patient expressed understanding and agreed to proceed.  Subjective:   [name] [name] is a 77 y.o. male who presents for Medicare Annual/Subsequent preventive examination.  Review of Systems     Cardiac Risk Factors include: advanced age (>7men, >41 women);dyslipidemia;family history of premature cardiovascular disease;hypertension;male gender     Objective:    Today's Vitals   12/24/22 1402  Weight: 165 lb (74.8 kg)  Height: 5\' 10"  (1.778 m)  PainSc: 0-No pain   Body mass index is 23.68 kg/m.     12/24/2022    2:03 PM 12/02/2017    8:07 AM  Advanced Directives  Does Patient Have a Medical Advance Directive? Yes Yes  Type of Paramedic of [place];Living will [place];Living will  Copy of [place] in Chart? No - copy requested No - copy requested    Current Medications (verified) Outpatient Encounter Medications as of 12/24/2022  Medication Sig   albuterol (VENTOLIN HFA) 108 (90 Base) MCG/ACT inhaler INHALE ONE TO TWO PUFFS INTO THE LUNGS EVERY SIX HOURS AS NEEDED FOR WHEEZING OR SHORTNESS OF BREATH. FILL WITH PROAIR/ALBUTEROL/VENTOLIN   apixaban (ELIQUIS) 5 MG TABS tablet Take by mouth.   carvedilol (COREG) 25 MG tablet Take 25 mg by mouth 2 (two) times daily with a meal.   digoxin (LANOXIN) 0.125 MG tablet Take 0.125 mg by mouth daily.   doxazosin (CARDURA) 4 MG tablet Take 1 tablet (4 mg total) by mouth at bedtime.   ENTRESTO 24-26 MG Take 1 tablet by mouth 2 (two) times daily.   fish oil-omega-3 fatty acids 1000 MG capsule Take 1 g by mouth daily.   fluticasone (FLONASE) 50 MCG/ACT nasal spray Place 2 sprays  into both nostrils daily.   fluticasone-salmeterol (ADVAIR DISKUS) 250-50 MCG/ACT AEPB Inhale 1 puff into the lungs in the morning and at bedtime.   furosemide (LASIX) 20 MG tablet Take 2 tablets (40 mg total) by mouth daily as needed.   levofloxacin (LEVAQUIN) 750 MG tablet Take 1 tablet (750 mg total) by mouth daily.   levothyroxine (SYNTHROID) 137 MCG tablet TAKE 1 TABLET BY MOUTH ONCE DAILY BEFORE BREAKFAST   Melatonin 5 MG CAPS Take 5 mg by mouth daily.    Multiple Vitamin (MULTIVITAMIN) tablet Take 1 tablet by mouth daily.   pravastatin (PRAVACHOL) 40 MG tablet Take 1 tablet (40 mg total) by mouth daily.   sildenafil (VIAGRA) 100 MG tablet Take 100 mg by mouth daily as needed.   spironolactone (ALDACTONE) 25 MG tablet TAKE 1 TABLET BY MOUTH ONCE DAILY   [DISCONTINUED] predniSONE (DELTASONE) 10 MG tablet Take 2 a day for 5 days, then 1 a day for 5 days, with food. Don't take with aleve/ibuprofen.   No facility-administered encounter medications on file as of 12/24/2022.    Allergies (verified) Patient has no known allergies.   History: Past Medical History:  Diagnosis Date   Atrial fibrillation (HCC)    CHF (congestive heart failure) ([place])    Cirrhosis ([place])    ED (erectile dysfunction)    Hyperglycemia    Hyperlipidemia    Hypertension    Internal hemorrhoids    SCCA (squamous cell carcinoma) of skin    Left  ear.  MOHS surgery 2014 [facility]   Skin cancer of face    2012 basal cell   Past Surgical History:  Procedure Laterality Date   A-Fib Cardiomyopathy [facility])  10/2006   EF 25 - 35%   DOPPLER ECHOCARDIOGRAPHY  10/31/2006   EF 25-35% Mod. M.R., mild -mod TR   Family History  Problem Relation Age of Onset   Hypertension Mother    Cancer Mother        Bilat. 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   Occupation: Building services engineer, Art therapist [employer]   Occupation: Semi-retired  Tobacco Use   Smoking status: Never   Smokeless tobacco: Never  Substance and Sexual Activity   Alcohol use: No    Comment: h/o abuse, quit 2008, rare use as of 2014, none since 09/2014   Drug use: No   Sexual activity: Not on file  Other Topics Concern   Not on file  Social History Narrative   Left handed   2 sons initially, 1 died September 09, 2009 from [place], his other son is local   From [place], [place]   Works some at [employer] as of 2023   Enjoys working in the yard   Widowed 2022, was married 1967   Caffeine daily      Wife died of pancreatic cancer about 6 weeks after diagnosis in 2022.    Social Determinants of Health   Financial Resource Strain: Low Risk  (12/24/2022)   Overall Financial Resource Strain (CARDIA)    Difficulty of Paying Living Expenses: Not hard at all  Food Insecurity: No Food Insecurity (12/24/2022)   Hunger Vital Sign    Worried About Running Out of Food in the Last Year: Never true    Ran Out of Food in the Last Year: Never true  Transportation Needs: No Transportation Needs (12/24/2022)   PRAPARE - Hydrologist (Medical): No    Lack of Transportation (Non-Medical): No  Physical Activity: Sufficiently Active (12/24/2022)   Exercise Vital Sign    Days of Exercise per Week: 7 days    Minutes of Exercise per Session: 30 min  Stress: No Stress Concern Present (12/24/2022)   [place]    Feeling of Stress : Not at all  Social Connections: Moderately  Integrated (12/24/2022)   Social Connection and Isolation Panel [NHANES]    Frequency of Communication with Friends and Family: More than three times a week    Frequency of Social Gatherings with Friends and Family: More than three times a week    Attends Religious Services: More than 4 times per year    Active Member of [employer] or Organizations: Yes    Attends Archivist Meetings: More than 4 times per year  Marital Status: Widowed    Tobacco Counseling Counseling given: Not Answered   Clinical Intake:  Pre-visit preparation completed: Yes  Pain : No/denies pain Pain Score: 0-No pain     BMI - recorded: 23.68 Nutritional Status: BMI of 19-24  Normal Nutritional Risks: None Diabetes: No  How often do you need to have someone help you when you read instructions, pamphlets, or other written materials from your doctor or pharmacy?: 1 - Never What is the last grade level you completed in school?: Retired  Diabetic? No  Interpreter Needed?: No  Information entered by :: Lisette Abu, LPN.   Activities of Daily Living    12/24/2022    2:07 PM  In your present state of health, do you have any difficulty performing the following activities:  Hearing? 0  Vision? 0  Difficulty concentrating or making decisions? 0  Walking or climbing stairs? 0  Dressing or bathing? 0  Doing errands, shopping? 0  Preparing Food and eating ? N  Using the Toilet? N  In the past six months, have you accidently leaked urine? N  Do you have problems with loss of bowel control? N  Managing your Medications? N  Managing your Finances? N  Housekeeping or managing your Housekeeping? N    Patient Care Team: Tonia Ghent, MD as PCP - General (Family Medicine) Ubaldo Glassing Javier Docker, MD as Consulting Physician (Cardiology)  Indicate any recent Medical Services you may have received from other than [facility] providers in the past year (date may be approximate).     Assessment:   This is  a routine wellness examination for Kylenn.  Hearing/Vision screen Hearing Screening - Comments:: Denies hearing difficulties   Vision Screening - Comments:: Wears rx glasses - up to date with routine eye exams with Dr. Thelma Comp   Dietary issues and exercise activities discussed: Current Exercise Habits: Home exercise routine, Type of exercise: walking;Other - see comments (2 acre land), Time (Minutes): 30, Frequency (Times/Week): 7, Weekly Exercise (Minutes/Week): 210, Intensity: Moderate, Exercise limited by: cardiac condition(s)   Goals Addressed             This Visit's Progress    My goal for 2024 is to keep breathing.        Depression Screen    12/24/2022    2:08 PM 01/16/2022    8:37 AM 12/22/2021   10:59 AM 12/19/2020   10:07 AM 12/12/2018    9:02 AM 12/02/2017    8:10 AM 11/26/2016    2:21 PM  PHQ 2/9 Scores  PHQ - 2 Score 0 0 0 0 0 0 0  PHQ- 9 Score 0 0    0     Fall Risk    12/24/2022    2:05 PM 01/16/2022    8:36 AM 12/22/2021   10:57 AM 12/19/2020   10:07 AM 12/12/2018    9:02 AM  Fall Risk   Falls in the past year? 0 0 0 0 0  Number falls in past yr: 0 0 0 0   Injury with Fall? 0 0 0 0   Risk for fall due to : No Fall Risks No Fall Risks No Fall Risks No Fall Risks   Follow up Falls prevention discussed Falls evaluation completed Falls evaluation completed Falls evaluation completed     [place]:  Any stairs in or around the home? No  If so, are there any without handrails? No  Home free of loose  throw rugs in walkways, pet beds, electrical cords, etc? Yes  Adequate lighting in your home to reduce risk of falls? Yes   ASSISTIVE DEVICES UTILIZED TO PREVENT FALLS:  Life alert? No  Use of a cane, walker or w/c? No  Grab bars in the bathroom? No  Shower chair or bench in shower? No  Elevated toilet seat or a handicapped toilet? Yes   TIMED UP AND GO:  Was the test performed? No . Telephonic Visit  Cognitive  Function:    12/02/2017    8:22 AM 12/02/2017    8:20 AM  MMSE - Mini Mental State Exam  Orientation to time 5 5  Orientation to Place 5 5  Registration 3 3  Attention/ Calculation 0 0  Recall 2 2  Recall-comments unable to recall 1 of 3 words unable to recall 1 of 3 words  Language- name 2 objects 0 0  Language- repeat 1 1  Language- follow 3 step command 3 3  Language- read & follow direction 0 0  Write a sentence 0 0  Copy design 0 0  Total score 19 19        12/24/2022    2:10 PM  6CIT Screen  What Year? 0 points  What month? 0 points  What time? 0 points  Count back from 20 0 points  Months in reverse 0 points  Repeat phrase 0 points  Total Score 0 points    Immunizations Immunization History  Administered Date(s) Administered   Fluad Quad(high Dose 65+) 05/12/2021   Influenza Whole 07/25/2009   Influenza, High Dose Seasonal PF 06/28/2017, 06/16/2018   Influenza-Unspecified 06/28/2013, 06/28/2014, 06/29/2015, 07/12/2016, 07/17/2020   PFIZER Comirnaty(Gray Top)Covid-19 Tri-Sucrose Vaccine 06/13/2022   PFIZER(Purple Top)SARS-COV-2 Vaccination 11/12/2019, 12/05/2019, 06/23/2020, 01/15/2021, 06/27/2021   Pneumococcal Conjugate-13 11/21/2015   Pneumococcal Polysaccharide-23 08/26/2011   Td 12/23/1998, 07/30/2009   Tdap 09/29/2019   Zoster Recombinat (Shingrix) 11/27/2010, 09/29/2019   Zoster, Live 11/21/2012    TDAP status: Up to date  Flu vaccine status: No record given  Pneumococcal vaccine status: Up to date  Covid-19 vaccine status: Completed vaccines  Qualifies for Shingles Vaccine? Yes   Zostavax completed Yes   Shingrix Completed?: Yes  Screening Tests Health Maintenance  Topic Date Due   INFLUENZA VACCINE  04/28/2022   COVID-19 Vaccine (7 - 2023-24 season) 08/08/2022   Medicare Annual Wellness (AWV)  12/24/2023   DTaP/Tdap/Td (4 - Td or Tdap) 09/28/2029   Pneumonia Vaccine 82+ Years old  Completed   Hepatitis C Screening  Completed   Zoster  Vaccines- Shingrix  Completed   HPV VACCINES  Aged Out   Fecal DNA (Cologuard)  Discontinued    Health Maintenance  Health Maintenance Due  Topic Date Due   INFLUENZA VACCINE  04/28/2022   COVID-19 Vaccine (7 - 2023-24 season) 08/08/2022    Colorectal cancer screening: No longer required.   Lung Cancer Screening: (Low Dose CT Chest recommended if Age 56-80 years, 30 pack-year currently smoking OR have quit w/in 15years.) does not qualify.   Lung Cancer Screening Referral: no  Additional Screening:  Hepatitis C Screening: does qualify; Completed 02/11/2017  Vision Screening: Recommended annual ophthalmology exams for early detection of glaucoma and other disorders of the eye. Is the patient up to date with their annual eye exam?  Yes  Who is the provider or what is the name of the office in which the patient attends annual eye exams? Dr. Thelma Comp If pt is not established with  a provider, would they like to be referred to a provider to establish care? No .   Dental Screening: Recommended annual dental exams for proper oral hygiene  Community Resource Referral / Chronic Care Management: CRR required this visit?  No   CCM required this visit?  No      Plan:     I have personally reviewed and noted the following in the patient's chart:   Medical and social history Use of alcohol, tobacco or illicit drugs  Current medications and supplements including opioid prescriptions. Patient is not currently taking opioid prescriptions. Functional ability and status Nutritional status Physical activity Advanced directives List of other physicians Hospitalizations, surgeries, and ER visits in previous 12 months Vitals Screenings to include cognitive, depression, and falls Referrals and appointments  In addition, I have reviewed and discussed with patient certain preventive protocols, quality metrics, and best practice recommendations. A written personalized care plan for  preventive services as well as general preventive health recommendations were provided to patient.     Sheral Flow, LPN   [id number]   Nurse Notes:  Normal cognitive status assessed by direct observation by this Nurse Health Advisor via phone conversation. No abnormalities found.

## 2022-12-25 LAB — DIGOXIN LEVEL: Digoxin Level: 1.4 mcg/L (ref 0.8–2.0)

## 2022-12-31 ENCOUNTER — Ambulatory Visit (INDEPENDENT_AMBULATORY_CARE_PROVIDER_SITE_OTHER): Payer: HMO | Admitting: Family Medicine

## 2022-12-31 ENCOUNTER — Encounter: Payer: Self-pay | Admitting: Family Medicine

## 2022-12-31 VITALS — BP 104/60 | HR 98 | Temp 98.0°F | Ht 70.0 in | Wt 171.0 lb

## 2022-12-31 DIAGNOSIS — R059 Cough, unspecified: Secondary | ICD-10-CM

## 2022-12-31 DIAGNOSIS — C439 Malignant melanoma of skin, unspecified: Secondary | ICD-10-CM | POA: Diagnosis not present

## 2022-12-31 DIAGNOSIS — Z1211 Encounter for screening for malignant neoplasm of colon: Secondary | ICD-10-CM

## 2022-12-31 DIAGNOSIS — E7800 Pure hypercholesterolemia, unspecified: Secondary | ICD-10-CM

## 2022-12-31 DIAGNOSIS — I4891 Unspecified atrial fibrillation: Secondary | ICD-10-CM

## 2022-12-31 DIAGNOSIS — Z7189 Other specified counseling: Secondary | ICD-10-CM

## 2022-12-31 DIAGNOSIS — K7460 Unspecified cirrhosis of liver: Secondary | ICD-10-CM

## 2022-12-31 DIAGNOSIS — E039 Hypothyroidism, unspecified: Secondary | ICD-10-CM | POA: Diagnosis not present

## 2022-12-31 DIAGNOSIS — Z0000 Encounter for general adult medical examination without abnormal findings: Secondary | ICD-10-CM

## 2022-12-31 MED ORDER — PRAVASTATIN SODIUM 40 MG PO TABS: 40.0000 mg | ORAL_TABLET | Freq: Every day | ORAL | 3 refills | Status: DC

## 2022-12-31 MED ORDER — APIXABAN 5 MG PO TABS: 5.0000 mg | ORAL_TABLET | Freq: Two times a day (BID) | ORAL | Status: DC

## 2022-12-31 MED ORDER — LEVOTHYROXINE SODIUM 137 MCG PO TABS: 137.0000 ug | ORAL_TABLET | Freq: Every day | ORAL | 3 refills | Status: DC

## 2022-12-31 MED ORDER — DOXAZOSIN MESYLATE 4 MG PO TABS: 4.0000 mg | ORAL_TABLET | Freq: Every day | ORAL | 3 refills | Status: DC

## 2022-12-31 NOTE — Patient Instructions (Addendum)
Let me know if you don't get a call about seeing cardiology or if you don't get the cologuard kit.    If the cough returns then let me know.  You may need to restart the discus and we may need to change to a cheaper version.    Take care.  Glad to see you.

## 2022-12-31 NOTE — Progress Notes (Signed)
Hypertension/CHF/AF.   Prev cardiologist retired.  He wanted to see Dr. Okey DupreEnd.   Anticoagulated.  No bleeding except from hemorrhoids.   Using medication without problems or lightheadedness: yes Chest pain with exertion:no Edema:no Short of breath: only if sig exertion- he is still doing all of his yard work.  This isn't an acute change Taking lasix about 1 time per month.   Labs d/w pt.   He needed to get set up with cardiology locally.  Referral placed.  Vaccines d/w pt, including RSV.     Advance directive- son designated if patient were incapacitated. cologuard 2021, ordered 2024.   Prostate cancer screening and PSA options (with potential risks and benefits of testing vs not testing) were discussed along with recent recs/guidelines.  He declined testing PSA at this point. Diet and exercise d/w pt.    Hypothyroidism. Compliant.  137 mcg daily.  No neck mass, no lumps. No dysphagia.    H/o melanoma with surgery last year.  He had derm f/u in the meantime- Dr. Adolphus Birchwoodasher.  Prev imaging with IMPRESSION: - Focal uptake in the posterior occipital scalp, likely reflecting primary malignancy; no definite evidence of nodal or distant metastatic disease.  Elevated Cholesterol: Using medications without problems: yes Muscle aches: no Diet compliance: yes Exercise: yes Labs d/w pt.    H/o cirrhosis.  No jaundice.  LFTs wnl.  Platelets stable.  Discussed esophageal screening and reasonable to defer for now.  D/w pt.    He is engaged to get married in June.  Congrats to patient.    Cough resolved after advair use last year.  No cough now.   Meds, vitals, and allergies reviewed.   PMH and SH reviewed  ROS: Per HPI unless specifically indicated in ROS section   GEN: nad, alert and oriented HEENT: mucous membranes moist, healed excision site on posterior scalp. NECK: supple w/o LA CV: IRR not tachy.   PULM: ctab, no inc wob ABD: soft, +bs EXT: no edema SKIN: no acute rash

## 2023-01-01 NOTE — Assessment & Plan Note (Signed)
Compliant.  137 mcg daily.  No neck mass, no lumps. No dysphagia.  Would continue levothyroxine as is.

## 2023-01-01 NOTE — Assessment & Plan Note (Signed)
Resolved after Advair use.  No cough now.  He can restart Advair if needed and update me as needed.  He may end up needing a cheaper alternative if he ends up having recurrent symptoms/seasonal symptoms.

## 2023-01-01 NOTE — Assessment & Plan Note (Signed)
Vaccines d/w pt, including RSV.     Advance directive- son designated if patient were incapacitated. cologuard 2021, ordered 2024.   Prostate cancer screening and PSA options (with potential risks and benefits of testing vs not testing) were discussed along with recent recs/guidelines.  He declined testing PSA at this point. Diet and exercise d/w pt.

## 2023-01-01 NOTE — Assessment & Plan Note (Signed)
Short of breath: only if sig exertion- he is still doing all of his yard work.  This isn't an acute change Taking lasix about 1 time per month.   Labs d/w pt.   Would continue Eliquis carvedilol digoxin doxazosin Entresto pravastatin and spironolactone as is with as needed Lasix.  Lungs are clear and doing well.

## 2023-01-01 NOTE — Assessment & Plan Note (Signed)
Advance directive- son designated if patient were incapacitated.   

## 2023-01-01 NOTE — Assessment & Plan Note (Signed)
History of.  Previous imaging discussed with patient. No jaundice.  LFTs wnl.  Platelets stable.  Discussed esophageal screening and reasonable to defer for now.  My concern is that esophageal screening at this point would be risky, especially given his concurrent anticoagulation.  D/w pt. he agrees to defer.

## 2023-01-01 NOTE — Assessment & Plan Note (Signed)
With healed excision site noted.  Per dermatology.  He will have routine follow-up.

## 2023-01-01 NOTE — Assessment & Plan Note (Signed)
Continue pravastatin as is.  Labs discussed with patient.

## 2023-01-20 DIAGNOSIS — Z1211 Encounter for screening for malignant neoplasm of colon: Secondary | ICD-10-CM | POA: Diagnosis not present

## 2023-01-29 LAB — COLOGUARD: COLOGUARD: POSITIVE — AB

## 2023-01-31 ENCOUNTER — Telehealth: Payer: Self-pay | Admitting: Family Medicine

## 2023-01-31 DIAGNOSIS — Z1211 Encounter for screening for malignant neoplasm of colon: Secondary | ICD-10-CM

## 2023-01-31 NOTE — Telephone Encounter (Signed)
Please call patient.  Cologuard is positive.  While this may not be anything emergent, and while it does not mean he has colon cancer, it makes sense to see the GI clinic for consideration of colonoscopy.  I pended the referral below, to sign if he consents.  I wanted him to get his results from [place] first.  Please let me know if he consents and if he has a location preference.  I think it makes sense to talk to GI.  Thanks.

## 2023-02-01 NOTE — Telephone Encounter (Signed)
Spoke with patient about results and referral. Patient is fine with GI referral.

## 2023-02-01 NOTE — Telephone Encounter (Signed)
Referral signed. Thanks!

## 2023-03-04 ENCOUNTER — Telehealth: Payer: Self-pay | Admitting: Pharmacist

## 2023-03-04 DIAGNOSIS — I509 Heart failure, unspecified: Secondary | ICD-10-CM

## 2023-03-04 DIAGNOSIS — I10 Essential (primary) hypertension: Secondary | ICD-10-CM

## 2023-03-04 NOTE — Telephone Encounter (Signed)
PharmD reviewed patient chart to assess eligibility for [facility] Care Management and Coordination services. Patient was determined to be a good candidate for the program given the complexity of the medication regimen and/or overall risk for hospitalization and increased utilization.  Referral entered in order to outreach patient and offer appointment with PharmD. Referral cosigned to PCP.  

## 2023-03-11 ENCOUNTER — Encounter: Payer: HMO | Admitting: Pharmacist

## 2023-03-17 ENCOUNTER — Other Ambulatory Visit: Payer: HMO

## 2023-03-17 NOTE — Progress Notes (Unsigned)
03/17/2023 Name: [name] [name] MRN: [id number] DOB: Aug 06, 1946  Chief Complaint  Patient presents with   Medication Management   [name] [name] is a 77 y.o. year old male who presented for a telephone visit.  Subjective:  Care Team: Primary Care Provider: Joaquim Nam, MD   Medication Access/Adherence Current Pharmacy:  [facility] [address] [place] - [address] [place] [address] Phone: [phone] Fax: [phone]  [facility] [phone] - [place], [place] - [address] [place] [address] Phone: [phone] Fax: [phone]  -Patient reports affordability concerns with their medications: Yes Eliquis and Entresto are very expensive and put him in the donut hole  Patient reports access/transportation concerns to their pharmacy: No  Patient reports adherence concerns with their medications:  Yes  Has been without Advair due to insurance coverage  Objective: Medications Reviewed Today     Reviewed by Wilburn Cornelia, CMA (Certified Medical Assistant) on 12/31/22 at 1027  Med List Status: <None>   Medication Order Taking? Sig Documenting Provider Last Dose Status Informant  albuterol (VENTOLIN HFA) 108 (90 Base) MCG/ACT inhaler [id number] Yes INHALE ONE TO TWO PUFFS INTO THE LUNGS EVERY SIX HOURS AS NEEDED FOR WHEEZING OR SHORTNESS OF BREATH. FILL WITH PROAIR/ALBUTEROL/VENTOLIN Joaquim Nam, MD Taking Active   apixaban (ELIQUIS) 5 MG TABS tablet [id number] Yes Take by mouth. [provider] Taking Active   carvedilol (COREG) 25 MG tablet [id number] Yes Take 25 mg by mouth 2 (two) times daily with a meal. [provider] Taking Active   digoxin (LANOXIN) 0.125 MG tablet [id number] Yes Take 0.125 mg by mouth daily. [provider] Taking Active   doxazosin (CARDURA) 4 MG tablet [id number] Yes Take 1 tablet (4 mg total) by mouth at bedtime. Joaquim Nam, MD Taking Active   ENTRESTO 24-26 [place]  [id number] Yes Take 1 tablet by mouth 2 (two) times daily. Joaquim Nam, MD Taking Active   fish oil-omega-3 fatty acids 1000 MG capsule [id number] Yes Take 1 g by mouth daily. [provider] Taking Active   fluticasone (FLONASE) 50 MCG/ACT nasal spray [id number] Yes Place 2 sprays into both nostrils daily. Mort Sawyers, FNP Taking Active   fluticasone-salmeterol (ADVAIR DISKUS) 250-50 MCG/ACT AEPB [id number] Yes Inhale 1 puff into the lungs in the morning and at bedtime. Excell Seltzer, MD Taking Active   furosemide (LASIX) 20 MG tablet [id number] Yes Take 2 tablets (40 mg total) by mouth daily as needed. Joaquim Nam, MD Taking Active   levothyroxine (SYNTHROID) 137 MCG tablet [id number] Yes TAKE 1 TABLET BY MOUTH ONCE DAILY BEFORE BREAKFAST Joaquim Nam, MD Taking Active   Melatonin 5 MG CAPS [id number] Yes Take 5 mg by mouth daily.  [provider] Taking Active            Med Note Janelle Floor Dec 18, 2019  2:17 PM)    Multiple Vitamin (MULTIVITAMIN) tablet [id number] Yes Take 1 tablet by mouth daily. [provider] Taking Active   pravastatin (PRAVACHOL) 40 MG tablet [id number] Yes Take 1 tablet (40 mg total) by mouth daily. Joaquim Nam, MD Taking Active   sildenafil (VIAGRA) 100 MG tablet [id number] Yes Take 100 mg by mouth daily as needed. [provider] Taking Active   spironolactone (ALDACTONE) 25 MG tablet [id number] Yes TAKE 1 TABLET BY MOUTH ONCE DAILY [provider] Taking Active  Assessment/Plan:   Medication Access/Adherence -Patient would qualify for PAP based on household income for both Eliquis and Entresto.  I made him aware of the out-of-pocket expenditure for Eliquis PAP approval; it is likely he has met this.  Will coordinate with medication assistance team to initiate PAP applications. -Referred to patient's insurance formulary, and his plan prefers Wixela 250-50.  Pending a prescription for Dr.  Para March to sign if in agreement.  Follow Up Plan: Will monitor progress of PAP and keep patient/provider informed.  Will also contact pharmacy to verify coverage of Wixela once order signed and inform patient of price.  Lenna Gilford, PharmD, DPLA

## 2023-03-18 MED ORDER — FLUTICASONE-SALMETEROL 250-50 MCG/ACT IN AEPB: 1.0000 | INHALATION_SPRAY | Freq: Two times a day (BID) | RESPIRATORY_TRACT | 12 refills | Status: DC

## 2023-03-22 ENCOUNTER — Telehealth: Payer: Self-pay

## 2023-03-22 NOTE — Progress Notes (Signed)
   03/22/2023  Patient ID: [name] [name], male   DOB: April 10, 1946, 77 y.o.   MRN: [id number]  Contacted pharmacy to verify coverage of Wixela, and 3 months is going through on insurance for $72.33.  If patient would like to get one month at a time, the cost should be around $25.  I was unable to reach [name] but left a voicemail with my direct number and am sending a MyChart message with this information, as well.  Lenna Gilford, PharmD, DPLA

## 2023-03-24 ENCOUNTER — Telehealth: Payer: Self-pay

## 2023-03-24 NOTE — Telephone Encounter (Signed)
PAP application for ENTRESTO 24/26 (NOVARTIS)AND ELIQUIS (BMS) has been mailed to pt home.   I will fax PCP pages once I receive pt pages   PLEASE BE ADVISED

## 2023-03-24 NOTE — Telephone Encounter (Signed)
-----   Message from Lenna Gilford, [facility] sent at 03/18/2023  4:49 PM EDT ----- Elvina Sidle there!  Patient would qualify for PAP for Eliquis and Entresto based on household income.  He is also aware of out-of-pocket requirement for Eliquis, and believes he has met this for the year.  Would you all mind to initiate the application process, please?  Thank you!

## 2023-03-31 ENCOUNTER — Telehealth: Payer: Self-pay

## 2023-03-31 NOTE — Progress Notes (Signed)
   03/31/2023  Patient ID: [name] [name], male   DOB: [birth year], 77 y.o.   MRN: [id number]  Patient outreach to follow-up on medication access and adherence.  Patient was able to get Advair generic (fluticasone-salmeterol 250-50) from his pharmacy, and endorses this product was much more affordable on his insurance plan.  He has also completed the PAP applications for Eliquis and Entresto and is returning these to the medication assistance team.  Once received, the medication assistance team will fax the provider portions of the applications for completion.  Lenna Gilford, PharmD, DPLA

## 2023-04-07 ENCOUNTER — Ambulatory Visit: Payer: HMO | Attending: Internal Medicine | Admitting: Internal Medicine

## 2023-04-07 ENCOUNTER — Encounter: Payer: Self-pay | Admitting: Internal Medicine

## 2023-04-07 ENCOUNTER — Other Ambulatory Visit
Admission: RE | Admit: 2023-04-07 | Discharge: 2023-04-07 | Disposition: A | Discharge status: Home / Self Care | Payer: HMO | Attending: Internal Medicine | Admitting: Internal Medicine

## 2023-04-07 VITALS — BP 110/62 | HR 80 | Ht 70.0 in | Wt 167.4 lb

## 2023-04-07 DIAGNOSIS — I5022 Chronic systolic (congestive) heart failure: Secondary | ICD-10-CM

## 2023-04-07 DIAGNOSIS — Z79899 Other long term (current) drug therapy: Secondary | ICD-10-CM | POA: Diagnosis not present

## 2023-04-07 DIAGNOSIS — E785 Hyperlipidemia, unspecified: Secondary | ICD-10-CM | POA: Diagnosis not present

## 2023-04-07 DIAGNOSIS — I4821 Permanent atrial fibrillation: Secondary | ICD-10-CM

## 2023-04-07 DIAGNOSIS — I10 Essential (primary) hypertension: Secondary | ICD-10-CM | POA: Diagnosis not present

## 2023-04-07 LAB — BASIC METABOLIC PANEL
Anion gap: 9 (ref 5–15)
BUN: 21 mg/dL (ref 8–23)
CO2: 26 mmol/L (ref 22–32)
Calcium: 9.5 mg/dL (ref 8.9–10.3)
Chloride: 105 mmol/L (ref 98–111)
Creatinine, Ser: 1.04 mg/dL (ref 0.61–1.24)
GFR, Estimated: >60 mL/min (ref >60)
Glucose, Bld: 115 mg/dL — ABNORMAL HIGH (ref 70–99)
Potassium: 4.6 mmol/L (ref 3.5–5.1)
Sodium: 140 mmol/L (ref 135–145)

## 2023-04-07 LAB — DIGOXIN LEVEL: Digoxin Level: 1.3 ng/mL (ref 0.8–2.0)

## 2023-04-07 NOTE — Progress Notes (Addendum)
Cardiology Office Note:  .   Date:  04/07/2023  ID:  [name] [name], [name] [name] Nov 11, 1945, MRN [id number] PCP: Joaquim Nam, MD  [facility] Providers Cardiologist:  Previously Mariel Kansky, MD and Sena Slate, MD [facility])    History of Present Illness: .   [name] [name] is a 77 y.o. male chronic atrial fibrillation, HFrEF due to possible alcohol induced cardiomyopathy, hypertension, and hyperlipidemia, who presents to establish cardiology care with our practice at the request of Dr. Para March.  He was last seen at [facility] in January, at which time he was feeling well.  It is unclear that he ever underwent ischemia evaluation for evaluation of his cardiomyopathy.  This was discussed at his visit with Dr. Beatrix Fetters a year ago; [name] [name] did not wish to proceed with additional testing at that time.  Today, [name] [name] reports that he feels well.  His only complaint is of exertional dyspnea with strenuous activities such as carrying heavy objects or cutting a tree.  He notes that it takes him a little longer to do such activities compared to a year or two ago.  He denies chest pain, lightheadedness, and edema.  He reports rare palpitations that last a second or two and are not associated with any other symptoms.  He reports having been in a-fib for at least the last 8 years, having previously reverted back to a-fib a few years after undergoing cardioversion.  He saw Dr. Graciela [name] in 2018 for consideration of rhythm control strategies of his atrial fibrillation.  However, it was felt that he would be unlikely to maintain sinus rhythm given his severe left atrial enlargement.  Ischemia evaluation and consideration of CRT were recommended by Dr. Graciela [name], though these were not pursued; [name] [name] has not followed up with Dr. Graciela [name] since then.  ROS: See HPI  Studies Reviewed: [name]   EKG Interpretation Date/Time:  Wednesday April 07 2023 09:34:45 EDT Ventricular Rate:  80 PR Interval:    QRS Duration:  156 QT  Interval:  390 QTC Calculation: 449 R Axis:   -82  Text Interpretation: Atrial fibrillation Left axis deviation Left bundle branch block When compared with ECG of 18-Feb-2017 No significant change was found Confirmed by Kimisha Eunice, Cristal Deer [phone]) on 04/07/2023 7:34:34 PM    TTE (03/27/2022, [facility]): Mildly dilated left ventricle with LVEF 25%.  Mitral annular calcification with severe regurgitation.  Mild aortic regurgitation.  Moderate to severe tricuspid regurgitation with moderate pulmonary hypertension (RVSP 56 mmHg).  Severe biatrial enlargement also present.  Risk Assessment/Calculations:    CHA2DS2-VASc Score = 4  This indicates a 4.8% annual risk of stroke. The patient's score is based upon: CHF History: 1 HTN History: 1 Diabetes History: 0 Stroke History: 0 Vascular Disease History: 0 Age Score: 2 Gender Score: 0   Physical Exam:   VS:  BP 110/62 (BP Location: Right Arm, Patient Position: Sitting, Cuff Size: Normal)   Pulse 80   Ht 5\' 10"  (1.778 m)   Wt 167 lb 6 oz (75.9 kg)   SpO2 97%   BMI 24.02 kg/m    Wt Readings from Last 3 Encounters:  04/07/23 167 lb 6 oz (75.9 kg)  12/31/22 171 lb (77.6 kg)  12/24/22 165 lb (74.8 kg)    General:  NAD.  Accompanied by his wife Neck: No JVD or HJR. Lungs: Clear to auscultation bilaterally without wheezes or crackles. Heart: Irregularly irregular rhythm without murmurs. Abdomen: Soft, nontender, nondistended. Extremities: Trace ankle edema  bilaterally with varicose veins noted.  ASSESSMENT AND PLAN: .    Chronic HFrEF: [name] [name] has a long history of HFrEF attributed to alcohol consumption.  However, he has never undergone ischemia evaluation despite multiple cardiac risk factors.  We discussed the rationale behind ischemia evaluation.  However, we have agreed to defer this pending repeat echocardiogram to reassess his LVEF and severe mitral regurgitation noted on last outside echo a year ago.  We will readdress cardiac  catheterization at our next visit.  He may ultimately need reevaluation by EP for CRT (though he is minimally symptomatic -> NYHA class II).  If severe MR is confirmed, consultation with the AHF and structural heart teams will need to be considered as well.  Continue current regimen of carvedilol, digoxin, Entresto, and spironolactone.  Consider adding SGLT-2 inhibitor in the near future.  Continue current dose of furosemide, as [name] appears euvolemic.  Permanent atrial fibrillation: Ventricular rate is reasonably well-controlled today.  I will check a BMP and digoxin level today.  If digoxin remains > 1, I would favor deescalation to minimize the risk of developing digoxin toxicity.  We will continue current doses of carvedilol and apixaban.  Hypertension: BP well-controlled today.  Continue current medications.  Hyperlipidemia: LDL well-controlled on last check in 11/2022.  Continue current dose of pravastatin.    Dispo: Return to clinic in 2 months.  Signed, [name], MD

## 2023-04-07 NOTE — Telephone Encounter (Signed)
I HAVE RECEIVED PT PAGES for ENTRESTO ([employer]) AND ELIQUIS([facility] [employer]). I HAVE FAXED PROVIDER PAGES TO OFFICE DR. Cheree Ditto OFFICE FOR COMPLETION.[name]  PLEASE BE ADVISED

## 2023-04-07 NOTE — Patient Instructions (Signed)
Medication Instructions:  Your physician recommends that you continue on your current medications as directed. Please refer to the Current Medication list given to you today.  *If you need a refill on your cardiac medications before your next appointment, please call your pharmacy*   Lab Work: Your provider would like for you to have following labs drawn: (BMP, Digoxin Level).   Please go to the [facility] entrance and check in at the front desk.  You do not need an appointment.  They are open from 7am-6 pm.     Testing/Procedures: Your physician has requested that you have an echocardiogram. Echocardiography is a painless test that uses sound waves to create images of your heart. It provides your doctor with information about the size and shape of your heart and how well your heart's chambers and valves are working.   You may receive an ultrasound enhancing agent through an IV if needed to better visualize your heart during the echo. This procedure takes approximately one hour.  There are no restrictions for this procedure.  This will take place at [address], [place] [address]    Follow-Up: At [facility], you and your health needs are our priority.  As part of our continuing mission to provide you with exceptional heart care, we have created designated Provider Care Teams.  These Care Teams include your primary Cardiologist (physician) and Advanced Practice Providers (APPs -  Physician Assistants and Nurse Practitioners) who all work together to provide you with the care you need, when you need it.  We recommend signing up for the patient portal called "MyChart".  Sign up information is provided on this After Visit Summary.  MyChart is used to connect with patients for Virtual Visits (Telemedicine).  Patients are able to view lab/test results, encounter notes, upcoming appointments, etc.  Non-urgent messages can be sent to your  provider as well.   To learn more about what you can do with MyChart, go to [email or web address].    Your next appointment:   2 month(s)  Provider:   You may see [name], MD or one of the following Advanced Practice Providers on your designated Care Team:   Nicolasa Ducking, NP Eula Listen, PA-C Cadence Fransico Michael, PA-C Charlsie Quest, NP

## 2023-04-08 ENCOUNTER — Other Ambulatory Visit: Payer: Self-pay

## 2023-04-08 DIAGNOSIS — Z79899 Other long term (current) drug therapy: Secondary | ICD-10-CM

## 2023-04-08 MED ORDER — DIGOXIN 62.5 MCG PO TABS: 0.0625 mg | ORAL_TABLET | Freq: Every day | ORAL | 11 refills | Status: DC

## 2023-04-13 NOTE — Telephone Encounter (Signed)
RECEIVED BOTH PT AND PROVIDER PAGES Submitted application for ELIQUIS to [employer]) for patient assistance PROCESS.   Phone: [phone]  RECEIVED BOTH PT AND PROVIDER PAGES AND Submitted application for ENTRESTO 24/26 to [employer] for patient assistance.   Phone: [phone]

## 2023-04-15 NOTE — Telephone Encounter (Signed)
Received notification from [employer]  regarding approval for ENTRESTO 24/26. Patient assistance APPROVED from 04/14/2023 to 09/28/2023.  Phone: [phone] PLEASE BE ADVISED PT MAY CALL FOR SHIPPING INFORMATION. LETTER OF APPROVAL SCANNED IN TO MEDIA OF CHART

## 2023-04-21 NOTE — Telephone Encounter (Signed)
Received notification from [facility]) regarding approval for [employer]. Patient assistance APPROVED from 04/20/2023 to 09/28/2023.  Phone: [phone]  PLEASE BE ADVISED PT MAY CALL THE NUMBER ABOVE  FOR SHIPPING INFORMATION  LETTER OF APPROVAL SCANNED INTO MEDIA OF CHART

## 2023-04-23 ENCOUNTER — Telehealth: Payer: Self-pay

## 2023-04-23 NOTE — Progress Notes (Signed)
   04/23/2023  Patient ID: [name] [name], male   DOB: 01-10-1946, 77 y.o.   MRN: [id number]  Patient outreach to inform [name] [name] that he has been approved for both Entresto and Eliquis patient assistance programs.  I was not able to reach him, but I did leave a voicemail with my direct phone number.  Lenna Gilford, PharmD, DPLA

## 2023-04-27 ENCOUNTER — Other Ambulatory Visit
Admission: RE | Admit: 2023-04-27 | Discharge: 2023-04-27 | Disposition: A | Discharge status: Home / Self Care | Payer: HMO | Source: Ambulatory Visit | Attending: Internal Medicine | Admitting: Internal Medicine

## 2023-04-27 DIAGNOSIS — Z79899 Other long term (current) drug therapy: Secondary | ICD-10-CM | POA: Diagnosis not present

## 2023-04-27 LAB — DIGOXIN LEVEL: Digoxin Level: 0.2 ng/mL — ABNORMAL LOW (ref 0.8–2.0)

## 2023-04-28 ENCOUNTER — Telehealth: Payer: Self-pay

## 2023-04-28 MED ORDER — DIGOXIN 125 MCG PO TABS: ORAL_TABLET | ORAL | 3 refills | Status: DC

## 2023-04-28 NOTE — Telephone Encounter (Signed)
Pt made aware of Dr. Jari Sportsman recommendations and verbalized understanding. Medication list updated

## 2023-04-28 NOTE — Telephone Encounter (Signed)
Pt made aware of lab results along with MD's recommendations.  Pt reported he has noticed an increase in his heart rate. No readings to report, but stated he can feel occasional flutters and heart racing.   Iran Ouch, MD  Parke Poisson, RN Digoxin level decreased significantly.  Continue same dose of digoxin for now.  If his ventricular rate increases upon follow-up, the dose can be increased again.  Will forward to MD

## 2023-04-28 NOTE — Telephone Encounter (Signed)
Recommend changing the dose of digoxin to 0.125 mg alternating with 0.0625 mg daily

## 2023-05-17 ENCOUNTER — Ambulatory Visit: Payer: HMO | Attending: Internal Medicine

## 2023-05-17 DIAGNOSIS — I5022 Chronic systolic (congestive) heart failure: Secondary | ICD-10-CM | POA: Diagnosis not present

## 2023-05-17 DIAGNOSIS — I4821 Permanent atrial fibrillation: Secondary | ICD-10-CM | POA: Diagnosis not present

## 2023-05-17 LAB — ECHOCARDIOGRAM COMPLETE
Est EF: <20
S' Lateral: 6 cm

## 2023-05-20 ENCOUNTER — Encounter: Payer: Self-pay | Admitting: Medical

## 2023-05-20 ENCOUNTER — Ambulatory Visit: Payer: HMO | Admitting: Medical

## 2023-05-20 VITALS — BP 100/52 | HR 80 | Ht 70.0 in | Wt 168.4 lb

## 2023-05-20 DIAGNOSIS — I4821 Permanent atrial fibrillation: Secondary | ICD-10-CM

## 2023-05-20 DIAGNOSIS — Z79899 Other long term (current) drug therapy: Secondary | ICD-10-CM | POA: Diagnosis not present

## 2023-05-20 DIAGNOSIS — I5020 Unspecified systolic (congestive) heart failure: Secondary | ICD-10-CM | POA: Diagnosis not present

## 2023-05-20 DIAGNOSIS — I340 Nonrheumatic mitral (valve) insufficiency: Secondary | ICD-10-CM

## 2023-05-20 DIAGNOSIS — E782 Mixed hyperlipidemia: Secondary | ICD-10-CM

## 2023-05-20 MED ORDER — EMPAGLIFLOZIN 10 MG PO TABS: 10.0000 mg | ORAL_TABLET | Freq: Every day | ORAL | 3 refills | Status: DC

## 2023-05-20 MED ORDER — CARVEDILOL 25 MG PO TABS: 12.5000 mg | ORAL_TABLET | Freq: Two times a day (BID) | ORAL | 3 refills | Status: DC

## 2023-05-20 MED ORDER — DIGOXIN 125 MCG PO TABS: 0.1250 mg | ORAL_TABLET | Freq: Two times a day (BID) | ORAL | 3 refills | Status: DC

## 2023-05-20 NOTE — Progress Notes (Signed)
Cardiology Office Note:    Date:  05/20/2023   ID:  [name] [name], [name] [name] Feb 25, 1946, MRN [id number]  PCP:  Joaquim Nam, MD  [facility] Cardiologist:  [name], MD  [facility] Electrophysiologist:  None   Referring MD: Joaquim Nam, MD   Chief Complaint: testing follow-up  History of Present Illness:    [name] [name] is a 77 y.o. male with a hx of permanent atrial fibrillation, HFrEF due to alcohol induced cardiomyopathy, HTN, HLD who presents for 1 month follow-up.   The patient was previously evaluated by [facility] and it is unclear if he underwent ischemic evaluation for cardiomyopathy.   The patient reported history of Afib. He saw Dr. Graciela Husbands 2018 for afib. However, it was felt that he would be unlikely to maintain SR given his severe Left atrial enlargement. Ischemia evaluation and consideration of CRT was recommended but not pursued.   The patient was last seen 04/07/23 and was overall feeling well. Plan was to repeat echo to assess EF. If EF still low, would undergo cardiac cath. IF severe MR, will need to see AHF and structural heart team.   Repeat echo showed LVEF <20%, global HK, moderately reduced RVSF, severely elevated pulmonary artery systolic pressure, severely dilated L&R atria, moderate to severe MR, mild to mod AI.   Today, the patient denies chest pain. He has stable shortness of breath. Shortness of breath has gotten worse over the last few years. He reports lower leg edema since going to the beach. He has taken lasix twice with improvement of swelling. He denies chest pain. Long discussion around low EF and cardiac cath. He is weary of scheduling a cardiac cath due to needing to do yard work. BP is soft today.   Past Medical History:  Diagnosis Date   Atrial fibrillation (HCC)    CHF (congestive heart failure) (HCC)    Cirrhosis (HCC)    ED (erectile dysfunction)    Hyperglycemia    Hyperlipidemia    Hypertension    Internal hemorrhoids    SCCA  (squamous cell carcinoma) of skin    Left ear.  MOHS surgery 2014 [facility]   Skin cancer of face    2012 basal cell    Past Surgical History:  Procedure Laterality Date   A-Fib Cardiomyopathy [facility])  10/2006   EF 25 - 35%   DOPPLER ECHOCARDIOGRAPHY  10/31/2006   EF 25-35% Mod. M.R., mild -mod TR    Current Medications: Current Meds  Medication Sig   albuterol (VENTOLIN HFA) 108 (90 Base) MCG/ACT inhaler INHALE ONE TO TWO PUFFS INTO THE LUNGS EVERY SIX HOURS AS NEEDED FOR WHEEZING OR SHORTNESS OF BREATH. FILL WITH PROAIR/ALBUTEROL/VENTOLIN   apixaban (ELIQUIS) 5 MG TABS tablet Take 1 tablet (5 mg total) by mouth 2 (two) times daily.   doxazosin (CARDURA) 4 MG tablet Take 1 tablet (4 mg total) by mouth at bedtime.   empagliflozin (JARDIANCE) 10 MG TABS tablet Take 1 tablet (10 mg total) by mouth daily before breakfast.   ENTRESTO 24-26 MG Take 1 tablet by mouth 2 (two) times daily.   fish oil-omega-3 fatty acids 1000 MG capsule Take 1 g by mouth daily.   furosemide (LASIX) 20 MG tablet Take 2 tablets (40 mg total) by mouth daily as needed.   levothyroxine (SYNTHROID) 137 MCG tablet Take 1 tablet (137 mcg total) by mouth daily before breakfast.   Melatonin 5 MG CAPS Take 5 mg by mouth daily.  Multiple Vitamin (MULTIVITAMIN) tablet Take 1 tablet by mouth daily.   pravastatin (PRAVACHOL) 40 MG tablet Take 1 tablet (40 mg total) by mouth daily.   sildenafil (VIAGRA) 100 MG tablet Take 100 mg by mouth daily as needed.   spironolactone (ALDACTONE) 25 MG tablet TAKE 1 TABLET BY MOUTH ONCE DAILY   [DISCONTINUED] carvedilol (COREG) 25 MG tablet Take 25 mg by mouth 2 (two) times daily with a meal.   [DISCONTINUED] digoxin (LANOXIN) 0.125 MG tablet Take 0.125 mg (1 tablet) every other day alternating with 0.0625 (1/2 tablet)     Allergies:   Patient has no known allergies.   Social History   Socioeconomic History   Marital status: Married    Spouse name: Not on file   Number of  children: 2   Years of education: Not on file   Highest education level: Not on file  Occupational History   Occupation: Actor, Market researcher business    Employer: RETIRED   Occupation: Airline pilot, Tour manager [employer]   Occupation: Semi-retired  Tobacco Use   Smoking status: Never   Smokeless tobacco: Never  Vaping Use   Vaping status: Never Used  Substance and Sexual Activity   Alcohol use: Yes    Alcohol/week: 15.0 standard drinks of alcohol    Types: 15 Glasses of wine per week    Comment: h/o abuse, quit 2008, now drinking 2-3 glasses per night   Drug use: No   Sexual activity: Not on file  Other Topics Concern   Not on file  Social History Narrative   Left handed   2 sons initially, 1 died 08/21/09 from lymphoma, his other son is local   From [place], [place]   Prev worked at [employer] as of 2023   Enjoys working in the yard   Widowed 2022, was married 1967   Caffeine daily      Wife died of pancreatic cancer about 6 weeks after diagnosis in 2022.    Social Determinants of Health   Financial Resource Strain: Low Risk  (12/24/2022)   Overall Financial Resource Strain (CARDIA)    Difficulty of Paying Living Expenses: Not hard at all  Food Insecurity: No Food Insecurity (12/24/2022)   Hunger Vital Sign    Worried About Running Out of Food in the Last Year: Never true    Ran Out of Food in the Last Year: Never true  Transportation Needs: No Transportation Needs (12/24/2022)   PRAPARE - Administrator, Civil Service (Medical): No    Lack of Transportation (Non-Medical): No  Physical Activity: Sufficiently Active (12/24/2022)   Exercise Vital Sign    Days of Exercise per Week: 7 days    Minutes of Exercise per Session: 30 min  Stress: No Stress Concern Present (12/24/2022)   [employer] of Occupational Health - Occupational Stress Questionnaire    Feeling of Stress : Not at all  Social Connections: Moderately Integrated (12/24/2022)    Social Connection and Isolation Panel [NHANES]    Frequency of Communication with Friends and Family: More than three times a week    Frequency of Social Gatherings with Friends and Family: More than three times a week    Attends Religious Services: More than 4 times per year    Active Member of [employer] or Organizations: Yes    Attends Banker Meetings: More than 4 times per year    Marital Status: Widowed     Family History: The patient's family history  includes Cancer in his mother; Colon cancer in his mother; Colon polyps in his mother; Heart disease in his father; Heart disease (age of onset: 82) in his brother; Hypertension in his mother. There is no history of Prostate cancer.  ROS:   Please see the history of present illness.     All other systems reviewed and are negative.  EKGs/Labs/Other Studies Reviewed:    The following studies were reviewed today:  Echo 2024   1. Left ventricular ejection fraction, by estimation, is <20%. The left  ventricle has severely decreased function. The left ventricle demonstrates  global hypokinesis. The left ventricular internal cavity size was  moderately to severely dilated. Left  ventricular diastolic parameters are indeterminate.   2. Right ventricular systolic function is moderately reduced. The right  ventricular size is mildly enlarged. There is severely elevated pulmonary  artery systolic pressure.   3. Left atrial size was severely dilated.   4. Right atrial size was severely dilated.   5. The mitral valve is normal in structure. Moderate to severe mitral  valve regurgitation.   6. Tricuspid valve regurgitation is moderate.   7. The aortic valve is tricuspid. Aortic valve regurgitation is mild to  moderate. Aortic valve sclerosis/calcification is present, without any  evidence of aortic stenosis.   8. The inferior vena cava is dilated in size with <50% respiratory  variability, suggesting right atrial pressure of 15  mmHg.   EKG:  EKG is  ordered today.  The ekg ordered today demonstrates Affib RBBB, LAFB, PVCs  Recent Labs: 12/24/2022: ALT 13; Hemoglobin 13.1; Platelets 109.0; TSH 4.98 04/07/2023: BUN 21; Creatinine, Ser 1.04; Potassium 4.6; Sodium 140  Recent Lipid Panel    Component Value Date/Time   CHOL 143 12/24/2022 0816   TRIG 78.0 12/24/2022 0816   HDL 58.80 12/24/2022 0816   CHOLHDL 2 12/24/2022 0816   VLDL 15.6 12/24/2022 0816   LDLCALC 69 12/24/2022 0816   LDLDIRECT 135.7 01/15/2009 1115    Physical Exam:    VS:  BP (!) 100/52 (BP Location: Left Arm, Patient Position: Sitting, Cuff Size: Small)   Pulse 80   Ht 5\' 10"  (1.778 m)   Wt 168 lb 6.4 oz (76.4 kg)   SpO2 95%   BMI 24.16 kg/m     Wt Readings from Last 3 Encounters:  05/20/23 168 lb 6.4 oz (76.4 kg)  04/07/23 167 lb 6 oz (75.9 kg)  12/31/22 171 lb (77.6 kg)     GEN:  Well nourished, well developed in no acute distress HEENT: Normal NECK: No JVD; No carotid bruits LYMPHATICS: No lymphadenopathy CARDIAC: Irreg Irreg, + murmur, no rubs, gallops RESPIRATORY:  Clear to auscultation without rales, wheezing or rhonchi  ABDOMEN: Soft, non-tender, non-distended MUSCULOSKELETAL:  trace lower leg edema; No deformity  SKIN: Warm and dry NEUROLOGIC:  Alert and oriented x 3 PSYCHIATRIC:  Normal affect   ASSESSMENT:    1. HFrEF (heart failure with reduced ejection fraction) (HCC)   2. Mitral valve insufficiency, unspecified etiology   3. Permanent atrial fibrillation (HCC)   4. Medication management   5. Hyperlipidemia, mixed    PLAN:    In order of problems listed above  Chronic HFrEF Patient has a long history of HFrEF attributed to alcohol consumption.  However, he has never undergone ischemic evaluation.  Patient is minimally symptomatic with progressive dyspnea on exertion that has worsened over the last few years.  Repeat echocardiogram showed LV EF less than 20%, moderate reduced RV function,  moderate to severe  mitral valve regurgitation.  A long discussion regarding cardiac catheterization and the reasons for it, however patient is reluctant to schedule this today due to needing to complete tasks at home. Patient will call back to confirm a procedure time in September.  I will place cath orders with labs today. He will need to hold Eliquis prior to cardiac cath. Blood pressure is soft today.  I will decrease Coreg to 12.5 mg twice daily and start Jardiance 10 mg daily.  BMET in 2 weeks. I will place a referral for advanced heart failure.   Mitral regurgitation Recent echo showed moderate to severe MR. Plan for cath as above. Will place structural valve team referral.   Permanent Afib Patient is in rate controlled Afib. Dig level low on the last labs. I will increase digoxin to 0.125mg BID. Dig level in 2 weeks. Continue Eliquis 5 mg BID.  HLD LDL 69, continue pravastatin.  Disposition: Follow up in 6 week(s) with MD/APP   Shared Decision Making/Informed Consent   Informed Consent   Shared Decision Making/Informed Consent The risks [stroke (1 in 1000), death (1 in 1000), kidney failure [usually temporary] (1 in 500), bleeding (1 in 200), allergic reaction [possibly serious] (1 in 200)], benefits (diagnostic support and management of coronary artery disease) and alternatives of a cardiac catheterization were discussed in detail with Mr. Symes and he is willing to proceed.      Signed, Karrissa Parchment Ardelle Lesches  05/20/2023 3:37 PM    [facility]

## 2023-05-20 NOTE — Patient Instructions (Addendum)
Medication Instructions:  Your physician recommends the following medication changes.  START TAKING: Jardiance 10 mg daily  DECREASE: Coreg 12.5 mg twice daily  INCREASE: Digoxin 0.125 mg twice daily  *If you need a refill on your cardiac medications before your next appointment, please call your pharmacy*   Lab Work: Your provider would like for you to have following labs drawn today CBC and BMET.  Your provider would like for you to return in 2 weeks to have the following labs drawn: BMET and Digoxin levels.   Please go to [facility] [address] ([facility]) [address], [place] [address] You do not need an appointment.  They are open from 7:30 am-4 pm.  Lunch from 1:00 pm- 2:00 pm You NOT need to be fasting.  If you have labs (blood work) drawn today and your tests are completely normal, you will receive your results only by: MyChart Message (if you have MyChart) OR A paper copy in the mail If you have any lab test that is abnormal or we need to change your treatment, we will call you to review the results.   Testing/Procedures: Your physician has requested that you have a cardiac catheterization. Cardiac catheterization is used to diagnose and/or treat various heart conditions. Doctors may recommend this procedure for a number of different reasons. The most common reason is to evaluate chest pain. Chest pain can be a symptom of coronary artery disease (CAD), and cardiac catheterization can show whether plaque is narrowing or blocking your heart's arteries. This procedure is also used to evaluate the valves, as well as measure the blood flow and oxygen levels in different parts of your heart. For further information please visit [email or web address] Please follow instruction sheet, as given.  Please see instruction below.    Follow-Up: At [facility], you and your health needs are our priority.  As part of our continuing mission to provide  you with exceptional heart care, we have created designated Provider Care Teams.  These Care Teams include your primary Cardiologist (physician) and Advanced Practice Providers (APPs -  Physician Assistants and Nurse Practitioners) who all work together to provide you with the care you need, when you need it.  We recommend signing up for the patient portal called "MyChart".  Sign up information is provided on this After Visit Summary.  MyChart is used to connect with patients for Virtual Visits (Telemedicine).  Patients are able to view lab/test results, encounter notes, upcoming appointments, etc.  Non-urgent messages can be sent to your provider as well.   To learn more about what you can do with MyChart, go to [email or web address].    Your next appointment:   Will be at the end of September  Provider:   You may see [name], MD or one of the following Advanced Practice Providers on your designated Care Team:   Nicolasa Ducking, NP Eula Listen, PA-C Cadence Fransico Michael, PA-C Charlsie Quest, NP    Other Instructions  [facility] A DEPT OF [place]. [facility] AT [facility] [address] [place] [address] Dept: [phone] Loc: [phone]  [name] [name]  05/20/2023  Please call us when you are ready to schedule this procedure..  1. Please arrive at the Heart & Vascular Center Entrance of [facility], [address], [place] [address].  Proceed to the Check-In Desk directly inside the entrance.  Procedure Parking: Use the entrance off of the [address] side of  the hospital. Turn right upon entering and follow the driveway to parking that is directly in front of the Heart & Vascular Center. There is no valet parking available at this entrance, however there is an awning directly in front of the Heart & Vascular Center for drop off/ pick up for patients.  Special note: Every effort is made to have your procedure  done on time. Please understand that emergencies sometimes delay scheduled procedures.  2. Diet: Do not eat solid foods after midnight.  The patient may have clear liquids until 5am upon the day of the procedure.  3. Labs: You will have labs completed today for this procedure.  4. Medication instructions in preparation for your procedure:   Contrast Allergy: No   Stop taking Eliquis (Apixiban) 2 days prior to procedure.  Hold Lasix, Jardiance, and Spirolactone the morning of the procedure.  On the morning of your procedure, take an Aspirin 81 mg and any morning medicines NOT listed above.  You may use sips of water.  5. Plan to go home the same day, you will only stay overnight if medically necessary. 6. Bring a current list of your medications and current insurance cards. 7. You MUST have a responsible person to drive you home. 8. Someone MUST be with you the first 24 hours after you arrive home or your discharge will be delayed. 9. Please wear clothes that are easy to get on and off and wear slip-on shoes.  Thank you for allowing [place] to care for you!   -- [place] [facility]

## 2023-05-21 ENCOUNTER — Telehealth: Payer: Self-pay | Admitting: Cardiology

## 2023-05-21 LAB — BASIC METABOLIC PANEL
BUN/Creatinine Ratio: 19 (ref 10–24)
BUN: 25 mg/dL (ref 8–27)
CO2: 24 mmol/L (ref 20–29)
Calcium: 9.4 mg/dL (ref 8.6–10.2)
Chloride: 103 mmol/L (ref 96–106)
Creatinine, Ser: 1.31 mg/dL — ABNORMAL HIGH (ref 0.76–1.27)
Glucose: 114 mg/dL — ABNORMAL HIGH (ref 70–99)
Potassium: 4.8 mmol/L (ref 3.5–5.2)
Sodium: 142 mmol/L (ref 134–144)
eGFR: 56 mL/min/{1.73_m2} — ABNORMAL LOW (ref >59)

## 2023-05-21 LAB — CBC
Hematocrit: 38.1 % (ref 37.5–51.0)
Hemoglobin: 12.8 g/dL — ABNORMAL LOW (ref 13.0–17.7)
MCH: 32.5 pg (ref 26.6–33.0)
MCHC: 33.6 g/dL (ref 31.5–35.7)
MCV: 97 fL (ref 79–97)
Platelets: 89 10*3/uL — CL (ref 150–450)
RBC: 3.94 x10E6/uL — ABNORMAL LOW (ref 4.14–5.80)
RDW: 13.6 % (ref 11.6–15.4)
WBC: 5.8 10*3/uL (ref 3.4–10.8)

## 2023-05-21 NOTE — Telephone Encounter (Signed)
Patient contacted after referral sent to Structural Heart for moderate/severe MR for consideration of mTEER. Patient would like to defer referral for now and wants to focus on getting through cardiac catheterization. Informed patient that we will delete referral for now. If testing with [facility] and TEE continue to show moderate to severe MR and patient agreeable, we are happy to schedule consultation with either Dr. Excell Seltzer or Dr. Lynnette Caffey at that time.   Georgie Chard NP-C Structural Heart Team  Pager: [phone] Phone: [phone]

## 2023-05-25 ENCOUNTER — Telehealth: Payer: Self-pay

## 2023-05-25 ENCOUNTER — Other Ambulatory Visit: Payer: Self-pay

## 2023-05-25 DIAGNOSIS — D696 Thrombocytopenia, unspecified: Secondary | ICD-10-CM

## 2023-05-25 NOTE — Telephone Encounter (Signed)
Called pt to follow up regarding scheduling cath. Pt stated he would like to defer procedure until November.   Will forward to Cadence and Dr. Okey Dupre to make aware.

## 2023-05-31 ENCOUNTER — Other Ambulatory Visit: Payer: Self-pay | Admitting: Family Medicine

## 2023-06-02 DIAGNOSIS — Z85828 Personal history of other malignant neoplasm of skin: Secondary | ICD-10-CM | POA: Diagnosis not present

## 2023-06-02 DIAGNOSIS — L570 Actinic keratosis: Secondary | ICD-10-CM | POA: Diagnosis not present

## 2023-06-02 DIAGNOSIS — D2272 Melanocytic nevi of left lower limb, including hip: Secondary | ICD-10-CM | POA: Diagnosis not present

## 2023-06-02 DIAGNOSIS — D2262 Melanocytic nevi of left upper limb, including shoulder: Secondary | ICD-10-CM | POA: Diagnosis not present

## 2023-06-02 DIAGNOSIS — D225 Melanocytic nevi of trunk: Secondary | ICD-10-CM | POA: Diagnosis not present

## 2023-06-02 DIAGNOSIS — Z85820 Personal history of malignant melanoma of skin: Secondary | ICD-10-CM | POA: Diagnosis not present

## 2023-06-02 DIAGNOSIS — D2261 Melanocytic nevi of right upper limb, including shoulder: Secondary | ICD-10-CM | POA: Diagnosis not present

## 2023-06-02 DIAGNOSIS — D0472 Carcinoma in situ of skin of left lower limb, including hip: Secondary | ICD-10-CM | POA: Diagnosis not present

## 2023-06-02 DIAGNOSIS — L821 Other seborrheic keratosis: Secondary | ICD-10-CM | POA: Diagnosis not present

## 2023-06-02 DIAGNOSIS — D485 Neoplasm of uncertain behavior of skin: Secondary | ICD-10-CM | POA: Diagnosis not present

## 2023-06-02 DIAGNOSIS — D2271 Melanocytic nevi of right lower limb, including hip: Secondary | ICD-10-CM | POA: Diagnosis not present

## 2023-06-02 DIAGNOSIS — C44629 Squamous cell carcinoma of skin of left upper limb, including shoulder: Secondary | ICD-10-CM | POA: Diagnosis not present

## 2023-06-02 MED ORDER — SILDENAFIL CITRATE 100 MG PO TABS: 100.0000 mg | ORAL_TABLET | Freq: Every day | ORAL | 3 refills | Status: DC | PRN

## 2023-06-02 MED ORDER — FUROSEMIDE 20 MG PO TABS: 40.0000 mg | ORAL_TABLET | Freq: Every day | ORAL | Status: DC | PRN

## 2023-06-02 MED ORDER — SPIRONOLACTONE 25 MG PO TABS: 25.0000 mg | ORAL_TABLET | Freq: Every day | ORAL | 0 refills | Status: DC

## 2023-06-07 ENCOUNTER — Other Ambulatory Visit: Payer: Self-pay

## 2023-06-07 DIAGNOSIS — Z79899 Other long term (current) drug therapy: Secondary | ICD-10-CM | POA: Diagnosis not present

## 2023-06-08 DIAGNOSIS — C44629 Squamous cell carcinoma of skin of left upper limb, including shoulder: Secondary | ICD-10-CM | POA: Diagnosis not present

## 2023-06-08 DIAGNOSIS — D0462 Carcinoma in situ of skin of left upper limb, including shoulder: Secondary | ICD-10-CM | POA: Diagnosis not present

## 2023-06-08 LAB — BASIC METABOLIC PANEL
BUN/Creatinine Ratio: 12 (ref 10–24)
BUN: 15 mg/dL (ref 8–27)
CO2: 25 mmol/L (ref 20–29)
Calcium: 9.3 mg/dL (ref 8.6–10.2)
Chloride: 103 mmol/L (ref 96–106)
Creatinine, Ser: 1.22 mg/dL (ref 0.76–1.27)
Glucose: 101 mg/dL — ABNORMAL HIGH (ref 70–99)
Potassium: 4.8 mmol/L (ref 3.5–5.2)
Sodium: 142 mmol/L (ref 134–144)
eGFR: 61 mL/min/{1.73_m2} (ref >59)

## 2023-06-08 LAB — DIGOXIN LEVEL: Digoxin, Serum: 1.3 ng/mL — ABNORMAL HIGH (ref 0.5–0.9)

## 2023-06-09 MED ORDER — DIGOXIN 125 MCG PO TABS: ORAL_TABLET | ORAL | 3 refills | Status: DC

## 2023-06-22 ENCOUNTER — Other Ambulatory Visit: Payer: Self-pay

## 2023-06-22 DIAGNOSIS — Z79899 Other long term (current) drug therapy: Secondary | ICD-10-CM

## 2023-06-22 DIAGNOSIS — D0472 Carcinoma in situ of skin of left lower limb, including hip: Secondary | ICD-10-CM | POA: Diagnosis not present

## 2023-06-23 LAB — DIGOXIN LEVEL: Digoxin, Serum: 0.6 ng/mL (ref 0.5–0.9)

## 2023-06-24 ENCOUNTER — Ambulatory Visit: Payer: HMO | Admitting: Nurse Practitioner

## 2023-06-28 ENCOUNTER — Telehealth: Payer: Self-pay | Admitting: Medical

## 2023-06-28 NOTE — Telephone Encounter (Signed)
*  STAT* If patient is at the pharmacy, call can be transferred to refill team.   1. Which medications need to be refilled? (please list name of each medication and dose if known)   digoxin (LANOXIN) 0.125 MG tablet    2. Which pharmacy/location (including street and city if local pharmacy) is medication to be sent to?  [facility] [address] [place] - [address]      3. Do they need a 30 day or 90 day supply? 90 day

## 2023-06-29 MED ORDER — DIGOXIN 125 MCG PO TABS: ORAL_TABLET | ORAL | 0 refills | Status: DC

## 2023-06-29 NOTE — Telephone Encounter (Signed)
Please contact pt for future appointment. 

## 2023-06-29 NOTE — Telephone Encounter (Signed)
Please contact pt for future appointment. Pt due for f/u.  

## 2023-06-29 NOTE — Telephone Encounter (Addendum)
Patient called to check on status of his refill, he is leaving town on Friday morning only has a few pills left.  I advised him he needed a f/u visit, he said he was just seen, and he said he canceled he upcoming appt.

## 2023-06-29 NOTE — Telephone Encounter (Signed)
Requested Prescriptions   Signed Prescriptions Disp Refills   digoxin (LANOXIN) 0.125 MG tablet 135 tablet 0    Sig: Take 1 tablet (0.125 mg total) by mouth every morning AND 0.5 tablets (0.0625 mg total) every evening.    Authorizing Provider: Marianne Sofia    Ordering User: Kendrick Fries

## 2023-06-30 ENCOUNTER — Ambulatory Visit: Payer: HMO | Admitting: Medical

## 2023-06-30 ENCOUNTER — Other Ambulatory Visit: Payer: Self-pay | Admitting: *Deleted

## 2023-06-30 MED ORDER — DIGOXIN 125 MCG PO TABS: ORAL_TABLET | ORAL | 0 refills | Status: DC

## 2023-06-30 NOTE — Telephone Encounter (Signed)
Left voicemail to schedule follow up appt

## 2023-07-26 NOTE — Telephone Encounter (Signed)
Patient was previously recommended to undergo cardiac catheterization but wished to defer until November.  Please reach out to [name] [name] to schedule follow-up with me or an APP at his earliest convenience to discuss moving forward with catheterization.  [name], MD [facility]

## 2023-07-26 NOTE — Telephone Encounter (Signed)
Left a message for the patient to call back.  

## 2023-07-29 NOTE — Telephone Encounter (Signed)
Attempted to contact pt x 2.  Left a message for the patient to call back.

## 2023-08-03 NOTE — Telephone Encounter (Signed)
I spoke with the patient and scheduled an appointment for 08/04/23.

## 2023-08-04 ENCOUNTER — Encounter: Payer: Self-pay | Admitting: Internal Medicine

## 2023-08-04 ENCOUNTER — Ambulatory Visit: Payer: HMO | Attending: Internal Medicine | Admitting: Internal Medicine

## 2023-08-04 VITALS — BP 100/60 | HR 102 | Ht 70.0 in | Wt 170.5 lb

## 2023-08-04 DIAGNOSIS — I4821 Permanent atrial fibrillation: Secondary | ICD-10-CM

## 2023-08-04 DIAGNOSIS — I10 Essential (primary) hypertension: Secondary | ICD-10-CM | POA: Diagnosis not present

## 2023-08-04 DIAGNOSIS — D696 Thrombocytopenia, unspecified: Secondary | ICD-10-CM

## 2023-08-04 DIAGNOSIS — I5022 Chronic systolic (congestive) heart failure: Secondary | ICD-10-CM

## 2023-08-04 DIAGNOSIS — I340 Nonrheumatic mitral (valve) insufficiency: Secondary | ICD-10-CM

## 2023-08-04 DIAGNOSIS — I5020 Unspecified systolic (congestive) heart failure: Secondary | ICD-10-CM

## 2023-08-04 MED ORDER — FUROSEMIDE 20 MG PO TABS: 40.0000 mg | ORAL_TABLET | Freq: Every day | ORAL | 3 refills | Status: DC | PRN

## 2023-08-04 NOTE — Progress Notes (Unsigned)
  Cardiology Office Note:  .   Date:  08/04/2023  ID:  [name] [name], DOB 1946/03/12, MRN [id number] PCP: Joaquim Nam, MD  [facility] Providers Cardiologist:  [name], MD { Click to update primary MD,subspecialty MD or APP then REFRESH:1}    History of Present Illness: .   [name] [name] is a 77 y.o. male with history of chronic atrial fibrillation, chronic HFrEF due to possible alcohol induced cardiomyopathy, hypertension, and hyperlipidemia, who presents for follow-up of his atrial fibrillation and HFrEF.  I met him in July, at which time his only complaint was of exertional dyspnea with strenuous activities.  We agreed to obtain an echocardiogram, which showed LVEF less than 20%, RV dysfunction with severe pulmonary hypertension, moderate to severe MR, and severely elevated CVP.  He was seen in follow-up in August by [name] [name], [place], at which time he reported feeling about the same as at our visit.  He noted some leg edema since having gone to the beach earlier in the summer.  He was encouraged to undergo cardiac catheterization but wished to defer this because of "needing to do yard work."  Having issues with his dog.  No CP, SOB.  Mild edema at [facility] but has resolved.  No palpitations/LH.  Stable intermittent palpitations.  Back on digoxin 1 pill per day.  Can't afford Jardiance.  Doesn't check BP regularly.  Last took furosemide about a week ago.  ROS: See HPI  Studies Reviewed: [name]   EKG Interpretation Date/Time:  Wednesday August 04 2023 10:58:33 EST Ventricular Rate:  102 PR Interval:    QRS Duration:  138 QT Interval:  384 QTC Calculation: 500 R Axis:   -72  Text Interpretation: Atrial fibrillation with rapid ventricular response Left axis deviation Right bundle branch block Inferior infarct , age undetermined Anterior infarct , age undetermined T wave abnormality, consider lateral ischemia When compared with ECG of 20-May-2023 13:29, HEART RATE has increased  Otherwise no significant change Confirmed by Urijah Arko, Cristal Deer [phone]) on 08/04/2023 11:03:40 AM    TTE (05/17/2023): Moderate to severely dilated left ventricle with LVEF less than 20%.  Mildly dilated right ventricle with moderately reduced function and severe pulmonary hypertension (RVSP 83 mmHg).  Severe biatrial enlargement.  Moderate to severe mitral regurgitation.  Moderate tricuspid regurgitation.  Aortic sclerosis with mild to moderate regurgitation.  Severely elevated CVP.  Risk Assessment/Calculations:   {Does this patient have ATRIAL FIBRILLATION?:[id number]}         Physical Exam:   VS:  BP 100/60 (BP Location: Left Arm, Patient Position: Sitting, Cuff Size: Normal)   Pulse (!) 102   Ht 5\' 10"  (1.778 m)   Wt 170 lb 8 oz (77.3 kg)   SpO2 94%   BMI 24.46 kg/m    Wt Readings from Last 3 Encounters:  08/04/23 170 lb 8 oz (77.3 kg)  05/20/23 168 lb 6.4 oz (76.4 kg)  04/07/23 167 lb 6 oz (75.9 kg)    General:  NAD. Neck: No JVD or HJR. Lungs: Clear to auscultation bilaterally without wheezes or crackles. Heart: Irregularly irregular with 2/6 systolic murmur. Abdomen: Soft, nontender, nondistended. Extremities: Trace pretibial edema bilaterally.  ASSESSMENT AND PLAN: .    ***    {Are you ordering a CV Procedure (e.g. stress test, cath, DCCV, TEE, etc)?   Press F2        :[id number]}  Dispo: ***  Signed, [name], MD

## 2023-08-04 NOTE — H&P (View-Only) (Signed)
  Cardiology Office Note:  .   Date:  08/04/2023  ID:  [name] [name], DOB 1946/03/12, MRN [id number] PCP: [name] Nam, MD  [facility] Providers Cardiologist:  [name] Kendall, MD { Click to update primary MD,subspecialty MD or APP then REFRESH:1}    History of Present Illness: .   [name] [name] is a 77 y.o. male with history of chronic atrial fibrillation, chronic HFrEF due to possible alcohol induced cardiomyopathy, hypertension, and hyperlipidemia, who presents for follow-up of his atrial fibrillation and HFrEF.  I met him in July, at which time his only complaint was of exertional dyspnea with strenuous activities.  We agreed to obtain an echocardiogram, which showed LVEF less than 20%, RV dysfunction with severe pulmonary hypertension, moderate to severe MR, and severely elevated CVP.  He was seen in follow-up in August by [name] [name], [place], at which time he reported feeling about the same as at our visit.  He noted some leg edema since having gone to the beach earlier in the summer.  He was encouraged to undergo cardiac catheterization but wished to defer this because of "needing to do yard work."  Having issues with his dog.  No CP, SOB.  Mild edema at [facility] but has resolved.  No palpitations/LH.  Stable intermittent palpitations.  Back on digoxin 1 pill per day.  Can't afford Jardiance.  Doesn't check BP regularly.  Last took furosemide about a week ago.  ROS: See HPI  Studies Reviewed: [name]   EKG Interpretation Date/Time:  Wednesday August 04 2023 10:58:33 EST Ventricular Rate:  102 PR Interval:    QRS Duration:  138 QT Interval:  384 QTC Calculation: 500 R Axis:   -72  Text Interpretation: Atrial fibrillation with rapid ventricular response Left axis deviation Right bundle branch block Inferior infarct , age undetermined Anterior infarct , age undetermined T wave abnormality, consider lateral ischemia When compared with ECG of 20-May-2023 13:29, HEART RATE has increased  Otherwise no significant change Confirmed by [name] [name], [name] [name] [phone]) on 08/04/2023 11:03:40 AM    TTE (05/17/2023): Moderate to severely dilated left ventricle with LVEF less than 20%.  Mildly dilated right ventricle with moderately reduced function and severe pulmonary hypertension (RVSP 83 mmHg).  Severe biatrial enlargement.  Moderate to severe mitral regurgitation.  Moderate tricuspid regurgitation.  Aortic sclerosis with mild to moderate regurgitation.  Severely elevated CVP.  Risk Assessment/Calculations:   {Does this patient have ATRIAL FIBRILLATION?:[id number]}         Physical Exam:   VS:  BP 100/60 (BP Location: Left Arm, Patient Position: Sitting, Cuff Size: Normal)   Pulse (!) 102   Ht 5\' 10"  (1.778 m)   Wt 170 lb 8 oz (77.3 kg)   SpO2 94%   BMI 24.46 kg/m    Wt Readings from Last 3 Encounters:  08/04/23 170 lb 8 oz (77.3 kg)  05/20/23 168 lb 6.4 oz (76.4 kg)  04/07/23 167 lb 6 oz (75.9 kg)    General:  NAD. Neck: No JVD or HJR. Lungs: Clear to auscultation bilaterally without wheezes or crackles. Heart: Irregularly irregular with 2/6 systolic murmur. Abdomen: Soft, nontender, nondistended. Extremities: Trace pretibial edema bilaterally.  ASSESSMENT AND PLAN: .    ***    {Are you ordering a CV Procedure (e.g. stress test, cath, DCCV, TEE, etc)?   Press F2        :[id number]}  Dispo: ***  Signed, [name] Kendall, MD

## 2023-08-04 NOTE — Patient Instructions (Signed)
Medication Instructions:  Your physician recommends the following medication changes.  STOP TAKING: Jardiance 10 mg daily  *If you need a refill on your cardiac medications before your next appointment, please call your pharmacy*   Lab Work: Your provider would like for you to return in 1 week prior to procedure to have the following labs drawn: (CBC, BMP).   Please go to [facility] [address] ([facility]) [address], [place] [address] You do not need an appointment.  They are open from 7:30 am-4 pm.  Lunch from 1:00 pm- 2:00 pm You will not need to be fasting.   Testing/Procedures: Your physician has requested that you have a cardiac catheterization. Cardiac catheterization is used to diagnose and/or treat various heart conditions. Doctors may recommend this procedure for a number of different reasons. The most common reason is to evaluate chest pain. Chest pain can be a symptom of coronary artery disease (CAD), and cardiac catheterization can show whether plaque is narrowing or blocking your heart's arteries. This procedure is also used to evaluate the valves, as well as measure the blood flow and oxygen levels in different parts of your heart. For further information please visit [email or web address] Please follow instruction sheet, as given. Please see instructions below   Follow-Up: At [facility], you and your health needs are our priority.  As part of our continuing mission to provide you with exceptional heart care, we have created designated Provider Care Teams.  These Care Teams include your primary Cardiologist (physician) and Advanced Practice Providers (APPs -  Physician Assistants and Nurse Practitioners) who all work together to provide you with the care you need, when you need it.  We recommend signing up for the patient portal called "MyChart".  Sign up information is provided on this After Visit Summary.  MyChart is used to connect with  patients for Virtual Visits (Telemedicine).  Patients are able to view lab/test results, encounter notes, upcoming appointments, etc.  Non-urgent messages can be sent to your provider as well.   To learn more about what you can do with MyChart, go to [email or web address].    Your next appointment:   2-3 week(s) from Cath procedure on 08/31/23  Provider:   [name], MD     [facility] A DEPT OF [place]. [facility] AT [facility] [address] [place] [address] Dept: [phone] Loc: [phone]  ZAFIR SCHAUER  08/04/2023  You are scheduled for a Cardiac Catheterization on Tuesday, December 3 with Dr. Cristal Deer End.  1. Please arrive at the Heart & Vascular Center Entrance of [facility], [address], [place] [address] at 8:30 AM (This is 1 hour(s) prior to your procedure time).  Proceed to the Check-In Desk directly inside the entrance.  Procedure Parking: Use the entrance off of the [address] side of the hospital. Turn right upon entering and follow the driveway to parking that is directly in front of the Heart & Vascular Center. There is no valet parking available at this entrance, however there is an awning directly in front of the Heart & Vascular Center for drop off/ pick up for patients.  Special note: Every effort is made to have your procedure done on time. Please understand that emergencies sometimes delay scheduled procedures.  2. Diet: Do not eat solid foods after midnight.  The patient may have clear liquids until 5am upon the day of the procedure.  3. Labs: You will  need to have blood drawn 1 week prior to procedure (CBC, BMP)  You do not need to be fasting.  4. Medication instructions in preparation for your procedure:   Contrast Allergy: No Hold 2 days prior to procedure Eliquis 5 mg twice daily  Start taking 2 days prior Aspirin 81 mg daily and the morning of procedure    Hold morning of procedure Lasix  Spironolactone  On the morning of your procedure, take your Aspirin 81 mg and any morning medicines NOT listed above.  You may use sips of water.  5. Plan to go home the same day, you will only stay overnight if medically necessary. 6. Bring a current list of your medications and current insurance cards. 7. You MUST have a responsible person to drive you home. 8. Someone MUST be with you the first 24 hours after you arrive home or your discharge will be delayed. 9. Please wear clothes that are easy to get on and off and wear slip-on shoes.  Thank you for allowing [place] to care for you!   -- [place] [facility]

## 2023-08-05 ENCOUNTER — Encounter: Payer: Self-pay | Admitting: Internal Medicine

## 2023-08-05 MED ORDER — FUROSEMIDE 20 MG PO TABS: 40.0000 mg | ORAL_TABLET | Freq: Every day | ORAL | 3 refills | Status: DC | PRN

## 2023-08-05 NOTE — Addendum Note (Signed)
Addended by: Parke Poisson on: 08/05/2023 01:18 PM   Modules accepted: Orders

## 2023-08-23 ENCOUNTER — Telehealth: Payer: Self-pay | Admitting: Family Medicine

## 2023-08-23 NOTE — Telephone Encounter (Signed)
Document is in envelope in tray in your office.

## 2023-08-23 NOTE — Telephone Encounter (Signed)
Patient dropped off document Patient Assistance Application, to be filled out by provider. Patient requested to send it back via Call Patient to pick up within 5-days. Document is located in providers tray at front office.Please advise at [facility] [phone]

## 2023-08-23 NOTE — Telephone Encounter (Signed)
I'll work on the hard copy.  Thanks.

## 2023-08-24 DIAGNOSIS — I5020 Unspecified systolic (congestive) heart failure: Secondary | ICD-10-CM | POA: Diagnosis not present

## 2023-08-25 LAB — BASIC METABOLIC PANEL
BUN/Creatinine Ratio: 13 (ref 10–24)
BUN: 18 mg/dL (ref 8–27)
CO2: 25 mmol/L (ref 20–29)
Calcium: 9.5 mg/dL (ref 8.6–10.2)
Chloride: 105 mmol/L (ref 96–106)
Creatinine, Ser: 1.35 mg/dL — ABNORMAL HIGH (ref 0.76–1.27)
Glucose: 113 mg/dL — ABNORMAL HIGH (ref 70–99)
Potassium: 4.7 mmol/L (ref 3.5–5.2)
Sodium: 143 mmol/L (ref 134–144)
eGFR: 54 mL/min/{1.73_m2} — ABNORMAL LOW (ref >59)

## 2023-08-25 LAB — CBC
Hematocrit: 40.2 % (ref 37.5–51.0)
Hemoglobin: 12.8 g/dL — ABNORMAL LOW (ref 13.0–17.7)
MCH: 31.8 pg (ref 26.6–33.0)
MCHC: 31.8 g/dL (ref 31.5–35.7)
MCV: 100 fL — ABNORMAL HIGH (ref 79–97)
Platelets: 93 10*3/uL — CL (ref 150–450)
RBC: 4.02 x10E6/uL — ABNORMAL LOW (ref 4.14–5.80)
RDW: 12.8 % (ref 11.6–15.4)
WBC: 5.5 10*3/uL (ref 3.4–10.8)

## 2023-08-30 ENCOUNTER — Other Ambulatory Visit: Payer: Self-pay | Admitting: Family Medicine

## 2023-08-30 DIAGNOSIS — D696 Thrombocytopenia, unspecified: Secondary | ICD-10-CM

## 2023-08-31 ENCOUNTER — Ambulatory Visit
Admission: RE | Admit: 2023-08-31 | Discharge: 2023-08-31 | Disposition: A | Discharge status: Home / Self Care | Payer: HMO | Attending: Internal Medicine | Admitting: Internal Medicine

## 2023-08-31 ENCOUNTER — Encounter: Payer: Self-pay | Admitting: Internal Medicine

## 2023-08-31 ENCOUNTER — Other Ambulatory Visit: Payer: Self-pay | Admitting: Family Medicine

## 2023-08-31 ENCOUNTER — Encounter
Admission: RE | Disposition: A | Discharge status: Home / Self Care | Payer: Self-pay | Source: Home / Self Care | Attending: Internal Medicine

## 2023-08-31 ENCOUNTER — Other Ambulatory Visit: Payer: Self-pay

## 2023-08-31 DIAGNOSIS — I110 Hypertensive heart disease with heart failure: Secondary | ICD-10-CM | POA: Diagnosis not present

## 2023-08-31 DIAGNOSIS — I340 Nonrheumatic mitral (valve) insufficiency: Secondary | ICD-10-CM | POA: Diagnosis not present

## 2023-08-31 DIAGNOSIS — I2510 Atherosclerotic heart disease of native coronary artery without angina pectoris: Secondary | ICD-10-CM | POA: Insufficient documentation

## 2023-08-31 DIAGNOSIS — I4821 Permanent atrial fibrillation: Secondary | ICD-10-CM | POA: Diagnosis not present

## 2023-08-31 DIAGNOSIS — I2584 Coronary atherosclerosis due to calcified coronary lesion: Secondary | ICD-10-CM | POA: Diagnosis not present

## 2023-08-31 DIAGNOSIS — I5022 Chronic systolic (congestive) heart failure: Secondary | ICD-10-CM | POA: Diagnosis not present

## 2023-08-31 DIAGNOSIS — Z7901 Long term (current) use of anticoagulants: Secondary | ICD-10-CM | POA: Diagnosis not present

## 2023-08-31 DIAGNOSIS — I5023 Acute on chronic systolic (congestive) heart failure: Secondary | ICD-10-CM

## 2023-08-31 HISTORY — PX: RIGHT/LEFT HEART CATH AND CORONARY ANGIOGRAPHY: CATH118266

## 2023-08-31 LAB — POCT I-STAT 7, (LYTES, BLD GAS, ICA,H+H)
Acid-base deficit: 1 mmol/L (ref 0.0–2.0)
Bicarbonate: 23.7 mmol/L (ref 20.0–28.0)
Calcium, Ion: 1.13 mmol/L — ABNORMAL LOW (ref 1.15–1.40)
HCT: 35 % — ABNORMAL LOW (ref 39.0–52.0)
Hemoglobin: 11.9 g/dL — ABNORMAL LOW (ref 13.0–17.0)
O2 Saturation: 95 %
Potassium: 3.7 mmol/L (ref 3.5–5.1)
Sodium: 142 mmol/L (ref 135–145)
TCO2: 25 mmol/L (ref 22–32)
pCO2 arterial: 39.3 mm[Hg] (ref 32–48)
pH, Arterial: 7.389 (ref 7.35–7.45)
pO2, Arterial: 74 mm[Hg] — ABNORMAL LOW (ref 83–108)

## 2023-08-31 SURGERY — RIGHT/LEFT HEART CATH AND CORONARY ANGIOGRAPHY
Anesthesia: Moderate Sedation | Laterality: Bilateral

## 2023-08-31 MED ORDER — HEPARIN SODIUM (PORCINE) 1000 UNIT/ML IJ SOLN
INTRAMUSCULAR | Status: DC | PRN
  Administered 2023-08-31: 3500 [IU] via INTRAVENOUS

## 2023-08-31 MED ORDER — FUROSEMIDE 40 MG PO TABS: 40.0000 mg | ORAL_TABLET | Freq: Every day | ORAL | 5 refills | Status: DC

## 2023-08-31 MED ORDER — ASPIRIN 81 MG PO CHEW
81.0000 mg | CHEWABLE_TABLET | ORAL | Status: DC
Start: 2023-09-01 — End: 2023-08-31

## 2023-08-31 MED ORDER — ONDANSETRON HCL 4 MG/2ML IJ SOLN: 4.0000 mg | Freq: Four times a day (QID) | INTRAMUSCULAR | Status: DC | PRN

## 2023-08-31 MED ORDER — VERAPAMIL HCL 2.5 MG/ML IV SOLN
INTRAVENOUS | Status: DC | PRN
  Administered 2023-08-31 (×2): 2.5 mg via INTRA_ARTERIAL

## 2023-08-31 MED ORDER — SODIUM CHLORIDE 0.9% FLUSH: 3.0000 mL | Freq: Two times a day (BID) | INTRAVENOUS | Status: DC

## 2023-08-31 MED ORDER — HEPARIN SODIUM (PORCINE) 1000 UNIT/ML IJ SOLN
INTRAMUSCULAR | Status: AC
  Filled 2023-08-31: qty 10

## 2023-08-31 MED ORDER — SODIUM CHLORIDE 0.9 % IV SOLN: 250.0000 mL | INTRAVENOUS | Status: DC | PRN

## 2023-08-31 MED ORDER — ACETAMINOPHEN 325 MG PO TABS: 650.0000 mg | ORAL_TABLET | ORAL | Status: DC | PRN

## 2023-08-31 MED ORDER — SODIUM CHLORIDE 0.9% FLUSH: 10.0000 mL | Freq: Two times a day (BID) | INTRAVENOUS | Status: DC

## 2023-08-31 MED ORDER — LIDOCAINE HCL 1 % IJ SOLN
INTRAMUSCULAR | Status: DC | PRN
  Administered 2023-08-31 (×2): 1 mL

## 2023-08-31 MED ORDER — SODIUM CHLORIDE 0.9% FLUSH: 3.0000 mL | INTRAVENOUS | Status: DC | PRN

## 2023-08-31 MED ORDER — VERAPAMIL HCL 2.5 MG/ML IV SOLN
INTRAVENOUS | Status: AC
Start: 2023-08-31 — End: ?
  Filled 2023-08-31: qty 2

## 2023-08-31 MED ORDER — MIDAZOLAM HCL 2 MG/2ML IJ SOLN
INTRAMUSCULAR | Status: DC | PRN
  Administered 2023-08-31: .5 mg via INTRAVENOUS

## 2023-08-31 MED ORDER — HYDRALAZINE HCL 20 MG/ML IJ SOLN: 10.0000 mg | INTRAMUSCULAR | Status: DC | PRN

## 2023-08-31 MED ORDER — HEPARIN (PORCINE) IN NACL 1000-0.9 UT/500ML-% IV SOLN
INTRAVENOUS | Status: AC
Start: 2023-08-31 — End: ?
  Filled 2023-08-31: qty 1000

## 2023-08-31 MED ORDER — IOHEXOL 300 MG/ML  SOLN
INTRAMUSCULAR | Status: DC | PRN
  Administered 2023-08-31: 21 mL

## 2023-08-31 MED ORDER — FENTANYL CITRATE (PF) 100 MCG/2ML IJ SOLN
INTRAMUSCULAR | Status: AC
  Filled 2023-08-31: qty 2

## 2023-08-31 MED ORDER — MIDAZOLAM HCL 2 MG/2ML IJ SOLN
INTRAMUSCULAR | Status: AC
  Filled 2023-08-31: qty 2

## 2023-08-31 MED ORDER — HEPARIN (PORCINE) IN NACL 1000-0.9 UT/500ML-% IV SOLN
INTRAVENOUS | Status: DC | PRN
  Administered 2023-08-31: 1000 mL

## 2023-08-31 MED ORDER — FENTANYL CITRATE (PF) 100 MCG/2ML IJ SOLN
INTRAMUSCULAR | Status: DC | PRN
  Administered 2023-08-31: 12.5 ug via INTRAVENOUS

## 2023-08-31 SURGICAL SUPPLY — 14 items
CATH BALLN WEDGE 5F 110CM (CATHETERS) IMPLANT
CATH INFINITI 5FR JL4 (CATHETERS) IMPLANT
CATH INFINITI AMBI 5FR TG (CATHETERS) IMPLANT
DEVICE RAD TR BAND REGULAR (VASCULAR PRODUCTS) IMPLANT
DRAPE BRACHIAL (DRAPES) IMPLANT
GLIDESHEATH SLEND A-KIT 6F 22G (SHEATH) IMPLANT
GUIDEWIRE INQWIRE 1.5J.035X260 (WIRE) IMPLANT
INQWIRE 1.5J .035X260CM (WIRE) ×1
PACK CARDIAC CATH (CUSTOM PROCEDURE TRAY) ×2 IMPLANT
PAD ELECT DEFIB RADIOL ZOLL (MISCELLANEOUS) IMPLANT
PROTECTION STATION PRESSURIZED (MISCELLANEOUS) ×1
SET ATX-X65L (MISCELLANEOUS) IMPLANT
SHEATH GLIDE SLENDER 4/5FR (SHEATH) IMPLANT
STATION PROTECTION PRESSURIZED (MISCELLANEOUS) IMPLANT

## 2023-08-31 NOTE — Interval H&P Note (Signed)
History and Physical Interval Note:  08/31/2023 9:42 AM  [name] [name]  has presented today for surgery, with the diagnosis of chronic HFrEF.  The various methods of treatment have been discussed with the patient and family. After consideration of risks, benefits and other options for treatment, the patient has consented to  Procedure(s): RIGHT/LEFT HEART CATH AND CORONARY ANGIOGRAPHY (Bilateral) as a surgical intervention.  The patient's history has been reviewed, patient examined, no change in status, stable for surgery.  I have reviewed the patient's chart and labs.  Questions were answered to the patient's satisfaction.    Cath Lab Visit (complete for each Cath Lab visit)  Clinical Evaluation Leading to the Procedure:   ACS: No.  Non-ACS:    Anginal/Heart Failure Classification: NYHA class I-II  Anti-ischemic medical therapy: Minimal Therapy (1 class of medications)  Non-Invasive Test Results: No non-invasive testing performed (LVEF <20%)  Prior CABG: No previous CABG  [name] [name]

## 2023-08-31 NOTE — Brief Op Note (Signed)
BRIEF CARDIAC CATHETERIZATION NOTE  08/31/2023  11:13 AM  PATIENT:  [name] [name]  77 y.o. male  PRE-OPERATIVE DIAGNOSIS:  Chronic HFrEF  POST-OPERATIVE DIAGNOSIS:  Same  PROCEDURE:  Procedure(s): RIGHT/LEFT HEART CATH AND CORONARY ANGIOGRAPHY (Bilateral)  SURGEON:  Surgeons and Role:    [name], MD - Primary  FINDINGS: Nonobstructive CAD. Mildly elevated left heart filling pressures. Moderately elevated right heart and pulmonary artery pressures. Low normal Fick cardiac output/index.  RECOMMENDATIONS: Escalate diuresis. Titrate GDMT as tolerated.  [name], MD [facility]

## 2023-09-01 ENCOUNTER — Other Ambulatory Visit: Payer: Self-pay | Admitting: Family Medicine

## 2023-09-01 ENCOUNTER — Encounter: Payer: Self-pay | Admitting: Internal Medicine

## 2023-09-01 LAB — POCT I-STAT EG7
Acid-Base Excess: 0 mmol/L (ref 0.0–2.0)
Bicarbonate: 26.1 mmol/L (ref 20.0–28.0)
Calcium, Ion: 1.15 mmol/L (ref 1.15–1.40)
HCT: 36 % — ABNORMAL LOW (ref 39.0–52.0)
Hemoglobin: 12.2 g/dL — ABNORMAL LOW (ref 13.0–17.0)
O2 Saturation: 61 %
Potassium: 3.8 mmol/L (ref 3.5–5.1)
Sodium: 141 mmol/L (ref 135–145)
TCO2: 27 mmol/L (ref 22–32)
pCO2, Ven: 45.1 mm[Hg] (ref 44–60)
pH, Ven: 7.37 (ref 7.25–7.43)
pO2, Ven: 33 mm[Hg] (ref 32–45)

## 2023-09-10 ENCOUNTER — Telehealth: Payer: Self-pay

## 2023-09-10 DIAGNOSIS — H524 Presbyopia: Secondary | ICD-10-CM | POA: Diagnosis not present

## 2023-09-10 DIAGNOSIS — H35372 Puckering of macula, left eye: Secondary | ICD-10-CM | POA: Diagnosis not present

## 2023-09-10 DIAGNOSIS — H52223 Regular astigmatism, bilateral: Secondary | ICD-10-CM | POA: Diagnosis not present

## 2023-09-10 DIAGNOSIS — H353131 Nonexudative age-related macular degeneration, bilateral, early dry stage: Secondary | ICD-10-CM | POA: Diagnosis not present

## 2023-09-10 DIAGNOSIS — H5203 Hypermetropia, bilateral: Secondary | ICD-10-CM | POA: Diagnosis not present

## 2023-09-10 DIAGNOSIS — H2513 Age-related nuclear cataract, bilateral: Secondary | ICD-10-CM | POA: Diagnosis not present

## 2023-09-10 NOTE — Telephone Encounter (Signed)
PT HAD SUBMITTED RE-ENROLLMENT TO COMPANY AND WAS MISSING PROVIDER PAGE. I WILL SEND PROVIDER PAGE TO PROVIDER AND PT WILL SUBMIT  HOUSEHOLD INCOME FOR OTHER MEMBER IN HOUSE  TO COMPANY  PLEASE BE ADVISED

## 2023-09-13 NOTE — Telephone Encounter (Signed)
I HAVE FAXED PROVIDER for ENTRESTO 24/26 to [employer] TO PROVIDER OFFICE FOR PATIENT WAS MISSING PROVIDER PAGES.   PLEASE BE ADVISED having provider office to send to company since the pt started the re-enrollment and they are just missing the provider page

## 2023-09-13 NOTE — Telephone Encounter (Signed)
Noted. Thanks.  I'll work on the hard copy.

## 2023-09-13 NOTE — Telephone Encounter (Signed)
PLEASE BE ADVISED PT IS EMAILING PAP TO ME AND I WILL BE SUBMITTING TO COMPANY. THE BMS SAID THEY HAVE THE PROVIDER PGS FOR ELIQUIS

## 2023-09-13 NOTE — Telephone Encounter (Signed)
Placed in your tray. The one that we sent today is for Eliquis this is one for [facility]

## 2023-09-17 NOTE — Telephone Encounter (Signed)
PAP: Patient has been denied for pt assistance by PAP Companies: [employer] due to 3% OUT OF POCKET  PRRESCRIPTION EXPENSES BASED ON HOUSE HOLD NOT MET      A LETTER REGARDING THIS DECISION HAS BEEN SENT TO YOUR PATIENT AND I SCANNED DENIAL LETTER IN MEDIA OF CHART  PENDING UNTIL MET.

## 2023-09-17 NOTE — Telephone Encounter (Signed)
Patient assistance signes and faxed on 09/17/23 to [employer] and sent to scan center

## 2023-10-04 ENCOUNTER — Ambulatory Visit: Payer: HMO | Attending: Internal Medicine | Admitting: Internal Medicine

## 2023-10-04 ENCOUNTER — Encounter: Payer: Self-pay | Admitting: Internal Medicine

## 2023-10-04 VITALS — BP 102/68 | HR 83 | Ht 70.0 in | Wt 174.0 lb

## 2023-10-04 DIAGNOSIS — Z79899 Other long term (current) drug therapy: Secondary | ICD-10-CM | POA: Diagnosis not present

## 2023-10-04 DIAGNOSIS — I340 Nonrheumatic mitral (valve) insufficiency: Secondary | ICD-10-CM | POA: Diagnosis not present

## 2023-10-04 DIAGNOSIS — I2510 Atherosclerotic heart disease of native coronary artery without angina pectoris: Secondary | ICD-10-CM | POA: Insufficient documentation

## 2023-10-04 DIAGNOSIS — I5022 Chronic systolic (congestive) heart failure: Secondary | ICD-10-CM | POA: Diagnosis not present

## 2023-10-04 DIAGNOSIS — I428 Other cardiomyopathies: Secondary | ICD-10-CM | POA: Insufficient documentation

## 2023-10-04 DIAGNOSIS — I4821 Permanent atrial fibrillation: Secondary | ICD-10-CM | POA: Diagnosis not present

## 2023-10-04 MED ORDER — EMPAGLIFLOZIN 10 MG PO TABS: 10.0000 mg | ORAL_TABLET | Freq: Every day | ORAL | 3 refills | Status: DC

## 2023-10-04 NOTE — Patient Instructions (Signed)
 Medication Instructions:  Your physician recommends the following medication changes.  START TAKING: Jardiance  10 mg by mouth daily  *If you need a refill on your cardiac medications before your next appointment, please call your pharmacy*   Lab Work: Your provider would like for you to have following labs drawn today (BMP, Dig level).     Testing/Procedures: Your physician has requested that you have an echocardiogram in 6-8 weeks. Echocardiography is a painless test that uses sound waves to create images of your heart. It provides your doctor with information about the size and shape of your heart and how well your heart's chambers and valves are working.   You may receive an ultrasound enhancing agent through an IV if needed to better visualize your heart during the echo. This procedure takes approximately one hour.  There are no restrictions for this procedure.  This will take place at [address] [facility] [address], [place] [address]  Please note: We ask at that you not bring children with you during ultrasound (echo/ vascular) testing. Due to room size and safety concerns, children are not allowed in the ultrasound rooms during exams. Our front office staff cannot provide observation of children in our lobby area while testing is being conducted. An adult accompanying a patient to their appointment will only be allowed in the ultrasound room at the discretion of the ultrasound technician under special circumstances. We apologize for any inconvenience.    Follow-Up: At [facility], you and your health needs are our priority.  As part of our continuing mission to provide you with exceptional heart care, we have created designated Provider Care Teams.  These Care Teams include your primary Cardiologist (physician) and Advanced Practice Providers (APPs -  Physician Assistants and Nurse Practitioners) who all work together to provide you with the care you need,  when you need it.  We recommend signing up for the patient portal called MyChart.  Sign up information is provided on this After Visit Summary.  MyChart is used to connect with patients for Virtual Visits (Telemedicine).  Patients are able to view lab/test results, encounter notes, upcoming appointments, etc.  Non-urgent messages can be sent to your provider as well.   To learn more about what you can do with MyChart, go to [email or web address].    Your next appointment:   After ECHO   Provider:   You may see [name], MD or one of the following Advanced Practice Providers on your designated Care Team:   Lonni Meager, NP Bernardino Bring, PA-C Cadence Franchester, PA-C Tylene Lunch, NP Barnie Hila, NP

## 2023-10-04 NOTE — Progress Notes (Signed)
 Cardiology Office Note:  .   Date:  10/04/2023  ID:  [name] [name], [name] [name] 11/20/1945, MRN [id number] PCP: Cleatus Arlyss RAMAN, MD  [facility] Providers Cardiologist:  [name], MD     History of Present Illness: .   [name] [name] is a 78 y.o. male with history of chronic atrial fibrillation, chronic HFrEF due to possible alcohol induced cardiomyopathy, hypertension, and hyperlipidemia, who presents for follow-up of atrial fibrillation and cardiomyopathy.  I last saw him in November, which time he was feeling well.  He noted some preceding leg edema while visiting the beach, though this resolved with as needed furosemide .  He underwent catheterization in early December for further workup of his chronic HFrEF and cardiomyopathy; this demonstrated mild to moderate nonobstructive CAD, mildly elevated left heart filling pressures, moderately elevated right heart and pulmonary artery pressures, and low normal Fick cardiac output/index.  Diuresis was escalated at that time.  Today, surround reports that he has been feeling fairly well.  He believes that his breathing and leg edema may have gotten a little better with escalation of furosemide .  He tries to take it at least 4-5 times a week but notes there are days where he cannot take it because of activities outside of the house and associated urinary frequency.  He has not had any chest pain, lightheadedness, or dizziness.  He notes rare palpitations.  He bruises easily but has not had any significant bleeding.  His wife notes that Mr. [name] [name] has not had quite as much energy since their trip to the beach in the fall.  ROS: See HPI  Studies Reviewed: [name]   EKG Interpretation Date/Time:  Monday October 04 2023 10:36:29 EST Ventricular Rate:  83 PR Interval:    QRS Duration:  166 QT Interval:  432 QTC Calculation: 507 R Axis:   -70  Text Interpretation: Atrial fibrillation with premature ventricular or aberrantly conducted complexes Right bundle  branch block Left anterior fascicular block Bifascicular block Possible Lateral infarct (cited on or before 04-Aug-2023) Abnormal ECG When compared with ECG of 04-Aug-2023 10:58, Vent. rate has decreased Confirmed by Xavian Hardcastle [phone]) on 10/04/2023 10:40:05 AM    R/LHC (08/31/2023): LMCA with 20% distal stenosis.  LAD with sequential 50% proximal and mid lesions.  LCx with 30% mid stenosis, and proximal/mid RCA with 30% disease and 70% stenosis at the ostium of AM1.  RA 11, RV 60/10, PA 60/28 (39), PCWP 23, LVEDP 20.  Ao sat 95%, PA sat 61%.  Fick CO/CI 4.7/2.4.  Risk Assessment/Calculations:    CHA2DS2-VASc Score = 4   This indicates a 4.8% annual risk of stroke. The patient's score is based upon: CHF History: 1 HTN History: 1 Diabetes History: 0 Stroke History: 0 Vascular Disease History: 0 Age Score: 2 Gender Score: 0            Physical Exam:   VS:  BP 102/68 (BP Location: Left Arm, Patient Position: Sitting, Cuff Size: Normal)   Pulse 83   Ht 5' 10 (1.778 m)   Wt 174 lb (78.9 kg)   SpO2 98%   BMI 24.97 kg/m    Wt Readings from Last 3 Encounters:  10/04/23 174 lb (78.9 kg)  08/31/23 170 lb 11.2 oz (77.4 kg)  08/04/23 170 lb 8 oz (77.3 kg)    General:  NAD. Neck: No JVD or HJR. Lungs: Clear to auscultation bilaterally without wheezes or crackles. Heart: Irregularly irregular rhythm with 2/6 systolic murmur. Abdomen: Soft, nontender,  nondistended. Extremities: Trace pretibial edema (L>R).  ASSESSMENT AND PLAN: .    Chronic HFrEF due to nonischemic cardiomyopathy and mitral regurgitation: Other than trace pretibial edema, Mr. [name] [name] appears euvolemic.  He feels a little better since we increased furosemide  to 40 mg daily, though he notes he skips some days because the urinary frequency would interfere with his activities.  I have encouraged him to try taking furosemide  as regularly as possible.  We have agreed to add empagliflozin  10 mg daily as long as it is not cost  prohibitive.  Continue current doses of carvedilol , Entresto , and spironolactone .  May need to consider changing doxazosin  to an alternative alpha-blocker in the future to allow for escalation of GDMT in the setting of soft blood pressure.  I will defer this for now.  We will plan to repeat an echocardiogram in 6 to 8 weeks to reassess LVEF after optimization of GDMT as well as previously noted moderate to severe MR.  If EF remains less than 35% and/or MR is still significant, we will need to consider referrals to EP and/or advanced heart failure team.  I will check a BMP and digoxin  level today.  Nonobstructive coronary artery disease and hyperlipidemia: No angina reported with recent catheterization showing mild to moderate, nonobstructive CAD.  Continue current regimen of apixaban  and lieu of aspirin  and pravastatin , with LDL at goal on last check.  Permanent atrial fibrillation: Ventricular rate reasonable today.  Continue current doses of carvedilol  and digoxin .  Check BMP and digoxin  level today with recent escalation of diuresis.    Dispo: Return to clinic shortly after echo in ~2 months.  Signed, [name], MD

## 2023-10-05 LAB — BASIC METABOLIC PANEL
BUN/Creatinine Ratio: 17 (ref 10–24)
BUN: 21 mg/dL (ref 8–27)
CO2: 24 mmol/L (ref 20–29)
Calcium: 9.5 mg/dL (ref 8.6–10.2)
Chloride: 102 mmol/L (ref 96–106)
Creatinine, Ser: 1.27 mg/dL (ref 0.76–1.27)
Glucose: 96 mg/dL (ref 70–99)
Potassium: 4.4 mmol/L (ref 3.5–5.2)
Sodium: 142 mmol/L (ref 134–144)
eGFR: 58 mL/min/{1.73_m2} — ABNORMAL LOW (ref >59)

## 2023-10-05 LAB — DIGOXIN LEVEL: Digoxin, Serum: 1.2 ng/mL — ABNORMAL HIGH (ref 0.5–0.9)

## 2023-10-27 ENCOUNTER — Other Ambulatory Visit: Payer: Self-pay | Admitting: Family Medicine

## 2023-10-27 NOTE — Telephone Encounter (Signed)
Last office visit: 12/31/22 Next office visit: nothing scheduled Last refill: Sildenafil Citrate 100 MG Oral Tablet 06/02/23 10 tablets 3 refills

## 2023-10-28 ENCOUNTER — Telehealth: Payer: Self-pay

## 2023-10-28 NOTE — Telephone Encounter (Signed)
Plz schedule CPE and fasting lab (no food/drink- except water and/or blk coffee 5 hrs prior) visits after 01/04/24.

## 2023-10-28 NOTE — Telephone Encounter (Signed)
Copied from CRM [id number]. Topic: Clinical - Request for Lab/Test Order >> Oct 28, 2023 11:07 AM Drema Balzarine wrote: Reason for CRM: Patient scheduled Medicare Annual Wellness Visit for 01/04/24 - says he gets lab work done a week prior? Requesting lab orders to schedule lab visit

## 2023-11-02 NOTE — Telephone Encounter (Signed)
PAP: PROVIDER PAGE FOR [name] [name] has been submitted to [employer], via fax  PLEASE BE ADVISE PROVIDER OFFICE SENT THE ELIQUIS MD PG TO [employer] NOT THE ENTRESTO PG.

## 2023-11-02 NOTE — Telephone Encounter (Signed)
 A user error has taken place: orders placed in error, not carried out on this patient.

## 2023-11-02 NOTE — Telephone Encounter (Signed)
 PAP: Patient assistance application for Entresto  has been approved by PAP Companies: [employer] from 11/02/2023 to 09/27/2024. Medication should be delivered to PAP Delivery: Home. For further shipping updates, please contact [employer] at [phone]. Patient ID is: [id number]   PLEASE BE ADVISED LETTER OF APPROVAL IS SCANNED IN MEDIA OF CHART

## 2023-11-03 ENCOUNTER — Encounter: Payer: Self-pay | Admitting: Family Medicine

## 2023-11-04 ENCOUNTER — Other Ambulatory Visit: Payer: Self-pay | Admitting: Family Medicine

## 2023-11-04 MED ORDER — TADALAFIL 10 MG PO TABS: 10.0000 mg | ORAL_TABLET | ORAL | 1 refills | Status: DC | PRN

## 2023-11-16 ENCOUNTER — Ambulatory Visit: Payer: HMO | Attending: Internal Medicine

## 2023-11-16 DIAGNOSIS — I340 Nonrheumatic mitral (valve) insufficiency: Secondary | ICD-10-CM | POA: Diagnosis not present

## 2023-11-16 DIAGNOSIS — I5022 Chronic systolic (congestive) heart failure: Secondary | ICD-10-CM

## 2023-11-16 LAB — ECHOCARDIOGRAM COMPLETE
AV Mean grad: 8 mm[Hg]
AV Peak grad: 14.4 mm[Hg]
Ao pk vel: 1.9 m/s
Area-P 1/2: 5.54 cm2
MV M vel: 4.98 m/s
MV Peak grad: 99 mm[Hg]
Radius: 0.9 cm
S' Lateral: 6.4 cm

## 2023-11-22 ENCOUNTER — Other Ambulatory Visit: Payer: Self-pay | Admitting: Family Medicine

## 2023-11-22 ENCOUNTER — Other Ambulatory Visit: Payer: Self-pay

## 2023-11-22 DIAGNOSIS — I5022 Chronic systolic (congestive) heart failure: Secondary | ICD-10-CM

## 2023-11-24 ENCOUNTER — Encounter: Payer: Self-pay | Admitting: Internal Medicine

## 2023-11-24 ENCOUNTER — Ambulatory Visit: Payer: HMO | Attending: Internal Medicine | Admitting: Internal Medicine

## 2023-11-24 VITALS — BP 100/62 | HR 100 | Ht 71.0 in | Wt 162.5 lb

## 2023-11-24 DIAGNOSIS — I428 Other cardiomyopathies: Secondary | ICD-10-CM

## 2023-11-24 DIAGNOSIS — I5022 Chronic systolic (congestive) heart failure: Secondary | ICD-10-CM | POA: Diagnosis not present

## 2023-11-24 DIAGNOSIS — I4821 Permanent atrial fibrillation: Secondary | ICD-10-CM

## 2023-11-24 NOTE — Progress Notes (Signed)
 Cardiology Office Note:  .   Date:  11/24/2023  ID:  [name], [name] October 15, 1945, MRN [id number] PCP: Joaquim Nam, MD   [facility] Providers Cardiologist:  [name], MD     History of Present Illness: .   [name] [name] is a 78 y.o. male with history of chronic atrial fibrillation, chronic HFrEF due to possible alcohol induced cardiomyopathy, hypertension, and hyperlipidemia, who presents for follow-up of HFrEF.  I last saw him in early January, at which time he felt like his breathing and leg swelling had improved a little bit with more frequent furosemide use.  We agreed to add an dapagliflozin and repeat an echo, which showed that his LVEF was still severely reduced at 25-30%.  Moderate to severe mitral regurgitation and moderate pulmonary hypertension were also evident.  Today, Mr. Beer reports that he is feeling a little bit better than at our last visit with more energy.  He thinks addition of empagliflozin may be contributing to this.  He is also taking furosemide more regularly, up to 4-5 times per week.  He denies chest pain, palpitations, or lightheadedness.  Mild exertional dyspnea is unchanged.  Leg swelling has been minimal as well.  He denies bleeding.  He is scheduled for consultation with Dr. Elwyn Lade in the advanced heart failure clinic next month.  ROS: See HPI  Studies Reviewed: [name]        TTE (11/16/2023): Moderately to severely dilated left ventricle with normal wall thickness.  LVEF 25-30% with global hypokinesis and abnormal global longitudinal strain (GLS -5.5%).  Indeterminate diastolic parameters.  Mildly dilated RV with moderately reduced function and moderate pulmonary hypertension.  Severe biatrial enlargement.  Degenerative mitral valve with moderate to severe regurgitation.  Moderate tricuspid regurgitation.  CVP approximately 8 mmHg.  Risk Assessment/Calculations:    CHA2DS2-VASc Score = 4   This indicates a 4.8% annual risk of stroke. The  patient's score is based upon: CHF History: 1 HTN History: 1 Diabetes History: 0 Stroke History: 0 Vascular Disease History: 0 Age Score: 2 Gender Score: 0            Physical Exam:   VS:  BP 100/62 (BP Location: Left Arm, Patient Position: Sitting, Cuff Size: Normal)   Pulse 100   Ht 5\' 11"  (1.803 m)   Wt 162 lb 8 oz (73.7 kg)   SpO2 95%   BMI 22.66 kg/m    Wt Readings from Last 3 Encounters:  11/24/23 162 lb 8 oz (73.7 kg)  10/04/23 174 lb (78.9 kg)  08/31/23 170 lb 11.2 oz (77.4 kg)    General:  NAD. Neck: No JVD or HJR. Lungs: Clear to auscultation bilaterally without wheezes or crackles. Heart: Irregularly irregular rhythm with 2/6 systolic murmur. Abdomen: Soft, nontender, nondistended. Extremities: Trace pretibial edema bilaterally.  ASSESSMENT AND PLAN: .    Chronic HFrEF due to nonischemic cardiomyopathy: Mr. Corigliano appears fairly euvolemic though he has trace chronic pretibial edema.  Energy and dyspnea are slightly better with addition of empagliflozin, consistent with NYHA class II heart failure.  Unfortunately, soft blood pressure precludes further escalation of his GDMT today.  We will continue his current regimen of carvedilol, Entresto, empagliflozin, and spironolactone.  Continue digoxin as well for inotropic support and rate control.  Proceed with heart failure consultation for optimization of GDMT and discussion of potential role for mTEER in the setting of his severely reduced LVEF and moderate to severe MR.  We discussed referral to EP for  consideration of ICD placement for primary prevention given LVEF less than 35%.  However, Mr. Huskins wishes to defer this pending his AHF consultation.  Permanent atrial fibrillation: Ventricular rate borderline today.  Continue current regimen of carvedilol and digoxin.  Digoxin level last month was just above goal, though de-escalation in the past has led to subtherapeutic effects.  Mr. Whitford wishes to continue his current  regimen.  Continue indefinite anticoagulation with apixaban.    Dispo: Return to clinic in 6 months.  Signed, [name], MD

## 2023-11-24 NOTE — Patient Instructions (Signed)
 Medication Instructions:  Your physician recommends that you continue on your current medications as directed. Please refer to the Current Medication list given to you today.   *If you need a refill on your cardiac medications before your next appointment, please call your pharmacy*   Lab Work: No labs ordered today    Testing/Procedures: No test ordered today    Follow-Up: At [facility], you and your health needs are our priority.  As part of our continuing mission to provide you with exceptional heart care, we have created designated Provider Care Teams.  These Care Teams include your primary Cardiologist (physician) and Advanced Practice Providers (APPs -  Physician Assistants and Nurse Practitioners) who all work together to provide you with the care you need, when you need it.  We recommend signing up for the patient portal called "MyChart".  Sign up information is provided on this After Visit Summary.  MyChart is used to connect with patients for Virtual Visits (Telemedicine).  Patients are able to view lab/test results, encounter notes, upcoming appointments, etc.  Non-urgent messages can be sent to your provider as well.   To learn more about what you can do with MyChart, go to [email or web address].    Your next appointment:   6 month(s)  Provider:   You may see [name], MD or one of the following Advanced Practice Providers on your designated Care Team:   Nicolasa Ducking, NP Eula Listen, PA-C Cadence Fransico Michael, PA-C Charlsie Quest, NP Carlos Levering, NP

## 2023-11-27 ENCOUNTER — Encounter: Payer: Self-pay | Admitting: Family Medicine

## 2023-12-13 ENCOUNTER — Telehealth: Payer: Self-pay | Admitting: Cardiology

## 2023-12-13 NOTE — Telephone Encounter (Signed)
 Lvm to confirm appt on 12/14/23

## 2023-12-14 ENCOUNTER — Ambulatory Visit: Payer: HMO | Attending: Cardiology | Admitting: Cardiology

## 2023-12-14 VITALS — BP 107/58 | HR 57 | Wt 167.0 lb

## 2023-12-14 DIAGNOSIS — I4821 Permanent atrial fibrillation: Secondary | ICD-10-CM

## 2023-12-14 DIAGNOSIS — I5022 Chronic systolic (congestive) heart failure: Secondary | ICD-10-CM

## 2023-12-14 NOTE — Progress Notes (Unsigned)
 ADVANCED HEART FAILURE NEW PATIENT CLINIC NOTE  Referring Physician: [name], MD  Primary Care: Joaquim Nam, MD Primary Cardiologist:  HPI: [name] [name] is a 78 y.o. male with a PMH of HFrEF, severe MR, afib, HTN, HLD who presents for initial visit for further evaluation and treatment of heart failure/cardiomyopathy.      Patient has a longstanding history of cardiomyopathy as well as atrial fibrillation.  He was seen by EP in 2018 but it was thought that secondary to left atrial enlargement at the time he would not be able to remain in sinus rhythm.  He has been followed by general cardiology with progressive worsening of his ejection fraction and worsening mitral regurgitation.     SUBJECTIVE:  Patient overall states that he is doing fairly well.  He reports some additional energy since he started the Jardiance as well as taking the furosemide more regularly.  He does have some mild shortness of breath with exertion, but is able to complete all his activities of daily living, can walk for about 30 minutes at a time and does not feel overly limited by his heart failure.  He denies any significant orthopnea.  PMH, current medications, allergies, social history, and family history reviewed in epic.  PHYSICAL EXAM: Vitals:   12/14/23 1017  BP: (!) 107/58  Pulse: (!) 57  SpO2: 99%   GENERAL: Well nourished and in no apparent distress at rest.  PULM:  Normal work of breathing, clear to auscultation bilaterally. Respirations are unlabored.  CARDIAC:  JVP: Mildly elevated        Irregular rate and rhythm, systolic murmur, trace edema. Warm and well perfused extremities. ABDOMEN: Soft, non-tender, non-distended. NEUROLOGIC: Patient is oriented x3 with no focal or lateralizing neurologic deficits.    DATA REVIEW  ECG: 10/04/2023: A-fib, right bundle branch block, left anterior fascicular block  ECHO: 11/16/2023: LVEF 25 to 30%, moderate to severely dilated, mildly  reduced RV function, severe biatrial dilation, moderate pulmonary hypertension, severe mitral regurgitation  CATH: (08/31/2023): LMCA with 20% distal stenosis. LAD with sequential 50% proximal and mid lesions. LCx with 30% mid stenosis, and proximal/mid RCA with 30% disease and 70% stenosis at the ostium of AM1. RA 11, RV 60/10, PA 60/28 (39), PCWP 23, LVEDP 20. Ao sat 95%, PA sat 61%. Fick CO/CI 4.7/2.4.     Heart failure review: - Classification: Heart failure with reduced EF - Etiology: Idiopathic - NYHA Class: II - Volume status: Euvolemic - ACEi/ARB/ARNI: Maximally tolerated dose - Aldosterone antagonist: Maximally tolerated dose - Beta-blocker: Maximally tolerated dose - Digoxin: Maximally tolerated dose - Hydralazine/Nitrates: Not indicated - SGLT2i: Maximally tolerated dose - GLP-1: Not a candidate - Advanced therapies: Not needed at this time - ICD: Needs referral   ASSESSMENT & PLAN:  Chronic systolic heart failure: Likely mixed ischemic/nonischemic, longstanding.  Already on excellent medical therapy at the time of arrival.  Discussion about transcatheter mitral valve procedure and purported benefits.  We discussed that workup would likely involve transesophageal echocardiogram and referral.  We discussed that given his pulmonary hypertension with a PA systolic of 60 he has a limited window where he might benefit from intervention.  He already has significant ventricular remodeling with an LVIDS of 6.4cm so his MR is likely more proportional to his degree of cardiomyopathy.  We also discussed potential advanced therapies including LVAD.  Given his clinical stability, he would like to defer at this time with close follow-up. -Continue current medical therapy of Entresto  24/26 mg twice daily, carvedilol 25 mg twice daily, digoxin 0.125 mg daily, Jardiance 10 mg daily, spironolactone 25 mg daily -Needs repeat dig level at next visit, instructed to hold medicine day of -Further  titration limited by blood pressure -Appears optimized from a volume perspective, continue furosemide 40 mg daily -Close follow-up -Would be a reasonable LVAD candidate as well if his LV function continues to deteriorate -Preserved cardiac index on recent right heart cath, but if symptoms worsen would be reasonable to reduce carvedilol -Long discussion about ICD, given stability would like to defer  Severe MR: Functional as above, does not appear out of proportion to degree of LV dysfunction and dilation. -Defer TEE and further workup at this time, close follow-up  Permanent atrial fibrillation: Longstanding, given mitral regurgitation atrial size is unlikely to return to sinus rhythm. -Continue anticoagulation  CAD: Moderate disease noted on recent catheterization, no ischemic symptoms. -Continue apixaban, pravastatin  Follow up in 3 months  Clearnce Hasten, MD Advanced Heart Failure Mechanical Circulatory Support 12/17/23

## 2023-12-14 NOTE — Patient Instructions (Signed)
  Follow-Up in: Please follow up with the Advanced Heart Failure Clinic in 3 months with Dr. Elwyn Lade.  At the Advanced Heart Failure Clinic, you and your health needs are our priority. We have a designated team specialized in the treatment of Heart Failure. This Care Team includes your primary Heart Failure Specialized Cardiologist (physician), Advanced Practice Providers (APPs- Physician Assistants and Nurse Practitioners), and Pharmacist who all work together to provide you with the care you need, when you need it.   You may see any of the following providers on your designated Care Team at your next follow up:  Dr. Arvilla Meres Dr. Marca Ancona Dr. Dorthula Nettles Dr. Theresia Bough Clarisa Kindred, FNP Enos Fling, RPH-CPP  Please be sure to bring in all your medications bottles to every appointment.   Need to Contact us:  If you have any questions or concerns before your next appointment please send [place] a message through [place] or call our office at [phone].    TO LEAVE A MESSAGE FOR THE NURSE SELECT OPTION 2, PLEASE LEAVE A MESSAGE INCLUDING: YOUR NAME DATE OF BIRTH CALL BACK NUMBER REASON FOR CALL**this is important as we prioritize the call backs  YOU WILL RECEIVE A CALL BACK THE SAME DAY AS LONG AS YOU CALL BEFORE 4:00 PM

## 2023-12-15 DIAGNOSIS — D225 Melanocytic nevi of trunk: Secondary | ICD-10-CM | POA: Diagnosis not present

## 2023-12-15 DIAGNOSIS — D0439 Carcinoma in situ of skin of other parts of face: Secondary | ICD-10-CM | POA: Diagnosis not present

## 2023-12-15 DIAGNOSIS — Z85828 Personal history of other malignant neoplasm of skin: Secondary | ICD-10-CM | POA: Diagnosis not present

## 2023-12-15 DIAGNOSIS — D2261 Melanocytic nevi of right upper limb, including shoulder: Secondary | ICD-10-CM | POA: Diagnosis not present

## 2023-12-15 DIAGNOSIS — D0472 Carcinoma in situ of skin of left lower limb, including hip: Secondary | ICD-10-CM | POA: Diagnosis not present

## 2023-12-15 DIAGNOSIS — Z85820 Personal history of malignant melanoma of skin: Secondary | ICD-10-CM | POA: Diagnosis not present

## 2023-12-15 DIAGNOSIS — D2272 Melanocytic nevi of left lower limb, including hip: Secondary | ICD-10-CM | POA: Diagnosis not present

## 2023-12-15 DIAGNOSIS — L570 Actinic keratosis: Secondary | ICD-10-CM | POA: Diagnosis not present

## 2023-12-15 DIAGNOSIS — D485 Neoplasm of uncertain behavior of skin: Secondary | ICD-10-CM | POA: Diagnosis not present

## 2023-12-15 DIAGNOSIS — D2262 Melanocytic nevi of left upper limb, including shoulder: Secondary | ICD-10-CM | POA: Diagnosis not present

## 2023-12-20 DIAGNOSIS — D0472 Carcinoma in situ of skin of left lower limb, including hip: Secondary | ICD-10-CM | POA: Diagnosis not present

## 2023-12-22 ENCOUNTER — Encounter: Payer: Self-pay | Admitting: Internal Medicine

## 2023-12-22 ENCOUNTER — Other Ambulatory Visit: Payer: Self-pay | Admitting: Family Medicine

## 2023-12-22 MED ORDER — CARVEDILOL 25 MG PO TABS: 25.0000 mg | ORAL_TABLET | Freq: Two times a day (BID) | ORAL | 1 refills | Status: DC

## 2023-12-26 ENCOUNTER — Other Ambulatory Visit: Payer: Self-pay | Admitting: Family Medicine

## 2023-12-26 DIAGNOSIS — I4891 Unspecified atrial fibrillation: Secondary | ICD-10-CM

## 2023-12-26 DIAGNOSIS — E039 Hypothyroidism, unspecified: Secondary | ICD-10-CM

## 2023-12-26 DIAGNOSIS — E7800 Pure hypercholesterolemia, unspecified: Secondary | ICD-10-CM

## 2023-12-26 DIAGNOSIS — R739 Hyperglycemia, unspecified: Secondary | ICD-10-CM

## 2023-12-30 ENCOUNTER — Other Ambulatory Visit (INDEPENDENT_AMBULATORY_CARE_PROVIDER_SITE_OTHER): Payer: HMO

## 2023-12-30 DIAGNOSIS — I4891 Unspecified atrial fibrillation: Secondary | ICD-10-CM

## 2023-12-30 DIAGNOSIS — E039 Hypothyroidism, unspecified: Secondary | ICD-10-CM | POA: Diagnosis not present

## 2023-12-30 DIAGNOSIS — E7800 Pure hypercholesterolemia, unspecified: Secondary | ICD-10-CM

## 2023-12-30 DIAGNOSIS — R739 Hyperglycemia, unspecified: Secondary | ICD-10-CM | POA: Diagnosis not present

## 2023-12-30 LAB — CBC WITH DIFFERENTIAL/PLATELET
Basophils Absolute: 0.1 10*3/uL (ref 0.0–0.1)
Basophils Relative: 1 % (ref 0.0–3.0)
Eosinophils Absolute: 0.3 10*3/uL (ref 0.0–0.7)
Eosinophils Relative: 4.8 % (ref 0.0–5.0)
HCT: 38.9 % — ABNORMAL LOW (ref 39.0–52.0)
Hemoglobin: 13.2 g/dL (ref 13.0–17.0)
Lymphocytes Relative: 20.9 % (ref 12.0–46.0)
Lymphs Abs: 1.2 10*3/uL (ref 0.7–4.0)
MCHC: 33.8 g/dL (ref 30.0–36.0)
MCV: 97.2 fl (ref 78.0–100.0)
Monocytes Absolute: 0.5 10*3/uL (ref 0.1–1.0)
Monocytes Relative: 8.9 % (ref 3.0–12.0)
Neutro Abs: 3.6 10*3/uL (ref 1.4–7.7)
Neutrophils Relative %: 64.4 % (ref 43.0–77.0)
Platelets: 96 10*3/uL — ABNORMAL LOW (ref 150.0–400.0)
RBC: 4.01 Mil/uL — ABNORMAL LOW (ref 4.22–5.81)
RDW: 15.9 % — ABNORMAL HIGH (ref 11.5–15.5)
WBC: 5.6 10*3/uL (ref 4.0–10.5)

## 2023-12-30 LAB — HEMOGLOBIN A1C: Hgb A1c MFr Bld: 5.9 % (ref 4.6–6.5)

## 2023-12-30 LAB — LIPID PANEL
Cholesterol: 148 mg/dL (ref 0–200)
HDL: 60.1 mg/dL (ref >39.00)
LDL Cholesterol: 76 mg/dL (ref 0–99)
NonHDL: 88.2
Total CHOL/HDL Ratio: 2
Triglycerides: 61 mg/dL (ref 0.0–149.0)
VLDL: 12.2 mg/dL (ref 0.0–40.0)

## 2023-12-30 LAB — COMPREHENSIVE METABOLIC PANEL WITH GFR
ALT: 14 U/L (ref 0–53)
AST: 14 U/L (ref 0–37)
Albumin: 4.2 g/dL (ref 3.5–5.2)
Alkaline Phosphatase: 66 U/L (ref 39–117)
BUN: 23 mg/dL (ref 6–23)
CO2: 31 meq/L (ref 19–32)
Calcium: 9.1 mg/dL (ref 8.4–10.5)
Chloride: 101 meq/L (ref 96–112)
Creatinine, Ser: 1.33 mg/dL (ref 0.40–1.50)
GFR: 51.62 mL/min — ABNORMAL LOW (ref >60.00)
Glucose, Bld: 96 mg/dL (ref 70–99)
Potassium: 4.2 meq/L (ref 3.5–5.1)
Sodium: 139 meq/L (ref 135–145)
Total Bilirubin: 2.4 mg/dL — ABNORMAL HIGH (ref 0.2–1.2)
Total Protein: 6.3 g/dL (ref 6.0–8.3)

## 2023-12-30 LAB — TSH: TSH: 5.05 u[IU]/mL (ref 0.35–5.50)

## 2024-01-04 ENCOUNTER — Ambulatory Visit (INDEPENDENT_AMBULATORY_CARE_PROVIDER_SITE_OTHER): Payer: HMO

## 2024-01-04 VITALS — BP 112/78 | Ht 71.0 in | Wt 171.2 lb

## 2024-01-04 DIAGNOSIS — Z0000 Encounter for general adult medical examination without abnormal findings: Secondary | ICD-10-CM

## 2024-01-04 NOTE — Patient Instructions (Signed)
 [name] [name] , Thank you for taking time to come for your Medicare Wellness Visit. I appreciate your ongoing commitment to your health goals. Please review the following plan we discussed and let me know if I can assist you in the future.   Referrals/Orders/Follow-Ups/Clinician Recommendations: none  This is a list of the screening recommended for you and due dates:  Health Maintenance  Topic Date Due   COVID-19 Vaccine (7 - 2024-25 season) 05/30/2023   Flu Shot  04/28/2024   Medicare Annual Wellness Visit  01/03/2025   DTaP/Tdap/Td vaccine (4 - Td or Tdap) 09/28/2029   Pneumonia Vaccine  Completed   Hepatitis C Screening  Completed   Zoster (Shingles) Vaccine  Completed   HPV Vaccine  Aged Out   Cologuard (Stool DNA test)  Discontinued    Advanced directives: (Copy Requested) Please bring a copy of your health care power of attorney and living will to the office to be added to your chart at your convenience. You can mail to [facility] [address] [place], [place] [address] or email to [email or web address]  Next Medicare Annual Wellness Visit scheduled for next year: Yes 01/05/24 @ 9:30am in person

## 2024-01-04 NOTE — Progress Notes (Signed)
 Subjective:   [name] [name] is a 78 y.o. who presents for a Medicare Wellness preventive visit.  Visit Complete: In person  Persons Participating in Visit: Patient.  AWV Questionnaire: No: Patient Medicare AWV questionnaire was not completed prior to this visit.  Cardiac Risk Factors include: advanced age (>66men, >86 women);hypertension;male gender;dyslipidemia;sedentary lifestyle     Objective:    Today's Vitals   01/04/24 0941  BP: 112/78  Weight: 171 lb 3.2 oz (77.7 kg)  Height: 5\' 11"  (1.803 m)   Body mass index is 23.88 kg/m.     01/04/2024    9:55 AM 12/24/2022    2:03 PM 12/02/2017    8:07 AM  Advanced Directives  Does Patient Have a Medical Advance Directive? Yes Yes Yes  Type of Estate agent of [place];Living will Healthcare Power of [place];Living will Healthcare Power of [place];Living will  Copy of Healthcare Power of Attorney in Chart? No - copy requested No - copy requested No - copy requested    Current Medications (verified) Outpatient Encounter Medications as of 01/04/2024  Medication Sig   apixaban (ELIQUIS) 5 MG TABS tablet Take 1 tablet (5 mg total) by mouth 2 (two) times daily.   carvedilol (COREG) 25 MG tablet Take 1 tablet (25 mg total) by mouth 2 (two) times daily with a meal.   digoxin (LANOXIN) 0.125 MG tablet Take 0.125 mg by mouth daily.   doxazosin (CARDURA) 4 MG tablet Take 1 tablet (4 mg total) by mouth at bedtime.   empagliflozin (JARDIANCE) 10 MG TABS tablet Take 1 tablet (10 mg total) by mouth daily before breakfast.   ENTRESTO 24-26 MG Take 1 tablet by mouth 2 (two) times daily.   fish oil-omega-3 fatty acids 1000 MG capsule Take 1 g by mouth daily.   fluticasone-salmeterol (ADVAIR) 250-50 MCG/ACT AEPB Inhale 1 puff into the lungs daily.   furosemide (LASIX) 40 MG tablet Take 1 tablet (40 mg total) by mouth daily.   levothyroxine (SYNTHROID) 137 MCG tablet TAKE 1 TABLET BY MOUTH ONCE DAILY BEFORE BREAKFAST    Melatonin 5 MG CAPS Take 5 mg by mouth daily.    Multiple Vitamin (MULTIVITAMIN) tablet Take 1 tablet by mouth daily.   pravastatin (PRAVACHOL) 40 MG tablet Take 1 tablet (40 mg total) by mouth daily.   sildenafil (VIAGRA) 100 MG tablet Take 100 mg by mouth daily as needed for erectile dysfunction.   spironolactone (ALDACTONE) 25 MG tablet Take 1 tablet by mouth once daily   No facility-administered encounter medications on file as of 01/04/2024.    Allergies (verified) Patient has no known allergies.   History: Past Medical History:  Diagnosis Date   Atrial fibrillation (HCC)    CHF (congestive heart failure) (HCC)    Cirrhosis (HCC)    ED (erectile dysfunction)    Hyperglycemia    Hyperlipidemia    Hypertension    Internal hemorrhoids    Myocardial infarction (HCC) 2008   SCCA (squamous cell carcinoma) of skin    Left ear.  MOHS surgery 2014 [facility]   Skin cancer of face    2012 basal cell   Thyroid disease    Past Surgical History:  Procedure Laterality Date   A-Fib Cardiomyopathy [facility])  10/2006   EF 25 - 35%   DOPPLER ECHOCARDIOGRAPHY  10/31/2006   EF 25-35% Mod. M.R., mild -mod TR   RIGHT/LEFT HEART CATH AND CORONARY ANGIOGRAPHY Bilateral 08/31/2023   Procedure: RIGHT/LEFT HEART CATH AND CORONARY ANGIOGRAPHY;  Surgeon:  End, Cristal Deer, MD;  Location: [facility] INVASIVE CV LAB;  Service: Cardiovascular;  Laterality: Bilateral;   Family History  Problem Relation Age of Onset   Hypertension Mother    Cancer Mother        Bilat. breast dz, s/p mastectomy, Bilat   Colon cancer Mother        possible   Colon polyps Mother    Heart disease Father        CHF, S/P CABG (27) Pacer Incr BP   Hyperlipidemia Father    Heart disease Brother 67       CAD- stent and ICD/pacer   Prostate cancer Neg Hx    Social History   Socioeconomic History   Marital status: Married    Spouse name: Not on file   Number of children: 2   Years of education: Not on file   Highest  education level: GED or equivalent  Occupational History   Occupation: Actor, Market researcher business    Employer: RETIRED   Occupation: Airline pilot, Tour manager [employer]   Occupation: Semi-retired  Tobacco Use   Smoking status: Former    Current packs/day: 0.00    Average packs/day: 0.3 packs/day for 1 year (0.3 ttl pk-yrs)    Types: Cigarettes    Quit date: 02/26/1966    Years since quitting: 57.8   Smokeless tobacco: Never   Tobacco comments:    Less than six months  Vaping Use   Vaping status: Never Used  Substance and Sexual Activity   Alcohol use: Yes    Alcohol/week: 11.0 standard drinks of alcohol    Types: 10 Glasses of wine, 1 Cans of beer per week    Comment: h/o abuse, quit 2008, now drinking 2-3 glasses per night   Drug use: Never   Sexual activity: Yes  Other Topics Concern   Not on file  Social History Narrative   Left handed   2 sons initially, 1 died 2009/09/08 from lymphoma, his other son is local   From [place], [place]   Prev worked at [employer] as of 2023   Enjoys working in the yard   Widowed 2022, was married 1967   Caffeine daily      Wife died of pancreatic cancer about 6 weeks after diagnosis in 2022.    Social Drivers of Corporate investment banker Strain: Low Risk  (01/04/2024)   Overall Financial Resource Strain (CARDIA)    Difficulty of Paying Living Expenses: Not hard at all  Food Insecurity: No Food Insecurity (01/01/2024)   Hunger Vital Sign    Worried About Running Out of Food in the Last Year: Never true    Ran Out of Food in the Last Year: Never true  Transportation Needs: No Transportation Needs (01/04/2024)   PRAPARE - Administrator, Civil Service (Medical): No    Lack of Transportation (Non-Medical): No  Physical Activity: Inactive (01/04/2024)   Exercise Vital Sign    Days of Exercise per Week: 0 days    Minutes of Exercise per Session: 0 min  Stress: No Stress Concern Present (01/04/2024)   [employer] of  Occupational Health - Occupational Stress Questionnaire    Feeling of Stress : Not at all  Social Connections: Moderately Integrated (01/04/2024)   Social Connection and Isolation Panel [NHANES]    Frequency of Communication with Friends and Family: More than three times a week    Frequency of Social Gatherings with Friends and Family: Twice a week  Attends Religious Services: More than 4 times per year    Active Member of Clubs or Organizations: No    Attends Banker Meetings: Never    Marital Status: Married    Tobacco Counseling Counseling given: Not Answered Tobacco comments: Less than six months    Clinical Intake:  Pre-visit preparation completed: Yes  Pain : No/denies pain     BMI - recorded: 23.88 Nutritional Status: BMI of 19-24  Normal Nutritional Risks: None Diabetes: No  Lab Results  Component Value Date   HGBA1C 5.9 12/30/2023     How often do you need to have someone help you when you read instructions, pamphlets, or other written materials from your doctor or pharmacy?: 1 - Never  Interpreter Needed?: No  Comments: lives with wife Information entered by :: B.Shary Lamos,LPN   Activities of Daily Living     01/04/2024    9:56 AM 08/31/2023    9:01 AM  In your present state of health, do you have any difficulty performing the following activities:  Hearing? 0 0  Vision? 0 0  Difficulty concentrating or making decisions? 0 0  Walking or climbing stairs? 0   Dressing or bathing? 0   Doing errands, shopping? 0   Preparing Food and eating ? N   Using the Toilet? N   In the past six months, have you accidently leaked urine? N   Do you have problems with loss of bowel control? N   Managing your Medications? N   Managing your Finances? N   Housekeeping or managing your Housekeeping? N     Patient Care Team: Joaquim Nam, MD as PCP - General (Family Medicine) End, Cristal Deer, MD as PCP - Cardiology (Cardiology) Lady Gary Darlin Priestly, MD  as Consulting Physician (Cardiology)  Indicate any recent Medical Services you may have received from other than [facility] providers in the past year (date may be approximate).     Assessment:   This is a routine wellness examination for [place].  Hearing/Vision screen Hearing Screening - Comments:: Pt says his hearing is good Vision Screening - Comments:: Pt says he wears glasses for reading and TV Dr Dion Body   Goals Addressed             This Visit's Progress    COMPLETED: My goal for 2024 is to keep breathing.   On track    Patient Stated       I would like to maintain my health       Depression Screen     01/04/2024    9:50 AM 12/31/2022   10:28 AM 12/24/2022    2:08 PM 01/16/2022    8:37 AM 12/22/2021   10:59 AM 12/19/2020   10:07 AM 12/12/2018    9:02 AM  PHQ 2/9 Scores  PHQ - 2 Score 0 0 0 0 0 0 0  PHQ- 9 Score  0 0 0       Fall Risk     01/04/2024    9:44 AM 12/31/2022   10:28 AM 12/24/2022    2:05 PM 01/16/2022    8:36 AM 12/22/2021   10:57 AM  Fall Risk   Falls in the past year? 0 0 0 0 0  Number falls in past yr: 0 0 0 0 0  Injury with Fall? 0 0 0 0 0  Risk for fall due to : No Fall Risks No Fall Risks No Fall Risks No Fall Risks No Fall Risks  Follow up  Education provided;Falls prevention discussed Falls evaluation completed Falls prevention discussed Falls evaluation completed Falls evaluation completed    MEDICARE RISK AT HOME:  Medicare Risk at Home Any stairs in or around the home?: No (1-2 steps outside) If so, are there any without handrails?: Yes Home free of loose throw rugs in walkways, pet beds, electrical cords, etc?: Yes Adequate lighting in your home to reduce risk of falls?: Yes Life alert?: No Use of a cane, walker or w/c?: No Grab bars in the bathroom?: No Shower chair or bench in shower?: Yes Elevated toilet seat or a handicapped toilet?: Yes  TIMED UP AND GO:  Was the test performed?  Yes  Length of time to ambulate 10 feet: 10  sec Gait steady and fast without use of assistive device  Cognitive Function: 6CIT completed    12/02/2017    8:22 AM 12/02/2017    8:20 AM  MMSE - Mini Mental State Exam  Orientation to time 5 5  Orientation to Place 5 5  Registration 3 3  Attention/ Calculation 0 0  Recall 2 2  Recall-comments unable to recall 1 of 3 words unable to recall 1 of 3 words  Language- name 2 objects 0 0  Language- repeat 1 1  Language- follow 3 step command 3 3  Language- read & follow direction 0 0  Write a sentence 0 0  Copy design 0 0  Total score 19 19        01/04/2024    9:58 AM 12/24/2022    2:10 PM  6CIT Screen  What Year? 0 points 0 points  What month? 0 points 0 points  What time? 0 points 0 points  Count back from 20 0 points 0 points  Months in reverse 0 points 0 points  Repeat phrase 0 points 0 points  Total Score 0 points 0 points    Immunizations Immunization History  Administered Date(s) Administered   Fluad Quad(high Dose 65+) 05/12/2021   Influenza Whole 07/25/2009   Influenza, High Dose Seasonal PF 06/28/2017, 06/16/2018   Influenza-Unspecified 06/28/2013, 06/28/2014, 06/29/2015, 07/12/2016, 07/17/2020   PFIZER Comirnaty(Gray Top)Covid-19 Tri-Sucrose Vaccine 06/13/2022   PFIZER(Purple Top)SARS-COV-2 Vaccination 11/12/2019, 12/05/2019, 06/23/2020, 01/15/2021, 06/27/2021   Pneumococcal Conjugate-13 11/21/2015   Pneumococcal Polysaccharide-23 08/26/2011   Td 12/23/1998, 07/30/2009   Tdap 09/29/2019   Zoster Recombinant(Shingrix) 11/27/2010, 09/29/2019   Zoster, Live 11/21/2012    Screening Tests Health Maintenance  Topic Date Due   COVID-19 Vaccine (7 - 2024-25 season) 05/30/2023   INFLUENZA VACCINE  04/28/2024   Medicare Annual Wellness (AWV)  01/03/2025   DTaP/Tdap/Td (4 - Td or Tdap) 09/28/2029   Pneumonia Vaccine 53+ Years old  Completed   Hepatitis C Screening  Completed   Zoster Vaccines- Shingrix  Completed   HPV VACCINES  Aged Out   Fecal DNA (Cologuard)   Discontinued    Health Maintenance  Health Maintenance Due  Topic Date Due   COVID-19 Vaccine (7 - 2024-25 season) 05/30/2023   Health Maintenance Items Addressed: None needed  Additional Screening:  Vision Screening: Recommended annual ophthalmology exams for early detection of glaucoma and other disorders of the eye.  Dental Screening: Recommended annual dental exams for proper oral hygiene  Community Resource Referral / Chronic Care Management: CRR required this visit?  No   CCM required this visit?  No    Plan:     I have personally reviewed and noted the following in the patient's chart:   Medical and social history  Use of alcohol, tobacco or illicit drugs  Current medications and supplements including opioid prescriptions. Patient is not currently taking opioid prescriptions. Functional ability and status Nutritional status Physical activity Advanced directives List of other physicians Hospitalizations, surgeries, and ER visits in previous 12 months Vitals Screenings to include cognitive, depression, and falls Referrals and appointments  In addition, I have reviewed and discussed with patient certain preventive protocols, quality metrics, and best practice recommendations. A written personalized care plan for preventive services as well as general preventive health recommendations were provided to patient.    Sue Lush, LPN   11/07/5619   After Visit Summary: (MyChart) Due to this being a telephonic visit, the after visit summary with patients personalized plan was offered to patient via MyChart   Notes: Nothing significant to report at this time.

## 2024-01-06 ENCOUNTER — Encounter: Payer: Self-pay | Admitting: Family Medicine

## 2024-01-06 ENCOUNTER — Ambulatory Visit (INDEPENDENT_AMBULATORY_CARE_PROVIDER_SITE_OTHER): Payer: HMO | Admitting: Family Medicine

## 2024-01-06 VITALS — BP 106/64 | HR 69 | Temp 98.1°F | Ht 69.06 in | Wt 166.6 lb

## 2024-01-06 DIAGNOSIS — E7800 Pure hypercholesterolemia, unspecified: Secondary | ICD-10-CM

## 2024-01-06 DIAGNOSIS — I5022 Chronic systolic (congestive) heart failure: Secondary | ICD-10-CM

## 2024-01-06 DIAGNOSIS — I4891 Unspecified atrial fibrillation: Secondary | ICD-10-CM | POA: Diagnosis not present

## 2024-01-06 DIAGNOSIS — Z7189 Other specified counseling: Secondary | ICD-10-CM | POA: Diagnosis not present

## 2024-01-06 DIAGNOSIS — E039 Hypothyroidism, unspecified: Secondary | ICD-10-CM

## 2024-01-06 DIAGNOSIS — Z0000 Encounter for general adult medical examination without abnormal findings: Secondary | ICD-10-CM

## 2024-01-06 DIAGNOSIS — R195 Other fecal abnormalities: Secondary | ICD-10-CM

## 2024-01-06 MED ORDER — DOXAZOSIN MESYLATE 4 MG PO TABS: 4.0000 mg | ORAL_TABLET | Freq: Every day | ORAL | 3 refills | Status: DC

## 2024-01-06 MED ORDER — PRAVASTATIN SODIUM 40 MG PO TABS: 40.0000 mg | ORAL_TABLET | Freq: Every day | ORAL | 3 refills | Status: DC

## 2024-01-06 MED ORDER — FUROSEMIDE 20 MG PO TABS: 20.0000 mg | ORAL_TABLET | Freq: Every day | ORAL | Status: DC

## 2024-01-06 MED ORDER — LEVOTHYROXINE SODIUM 137 MCG PO TABS: 137.0000 ug | ORAL_TABLET | Freq: Every day | ORAL | 3 refills | Status: DC

## 2024-01-06 MED ORDER — SPIRONOLACTONE 25 MG PO TABS: 25.0000 mg | ORAL_TABLET | Freq: Every day | ORAL | 3 refills | Status: DC

## 2024-01-06 NOTE — Progress Notes (Signed)
 Hypertension:    Using medication without problems or lightheadedness: yes Chest pain with exertion:no Edema:no Short of breath: only with sig exertion.  He is still able to work on the yard but with a slower pace than years ago.   Seeing CHF clinic at baseline.   He has muscle cramping with daily use of 40mg  lasix.  D/w pt about trying 20mg  per day instead of 40mg .    Elevated Cholesterol: Using medications without problems:yes Muscle aches: no Diet compliance: d/w pt.   Exercise: d/w pt.    Overall rare use of advair, doing well as is.    Hypothyroidism.  Compliant with levothyroxine.  No ADE on med.  No neck mass.  No dysphagia.    Still anticoagulated with h/o AF.  No bleeding.  Some bruising as expected.  No ADE on med o/w.  Compliant.   He is seeing dermatology.    Vaccines d/w pt.   Advance directive- wife and/or son equally designated if patient were incapacitated. Cologuard 2024, it was positive, d/w pt about GI eval.  Referral placed.   Prostate cancer screening and PSA options (with potential risks and benefits of testing vs not testing) were discussed along with recent recs/guidelines.  He declined testing PSA at this point. Diet and exercise d/w pt.    Meds, vitals, and allergies reviewed.   ROS: Per HPI unless specifically indicated in ROS section   GEN: nad, alert and oriented HEENT: ncat NECK: supple w/o LA CV: IRR PULM: ctab, no inc wob ABD: soft, +bs EXT: no edema SKIN: no acute rash

## 2024-01-06 NOTE — Patient Instructions (Addendum)
 I would try taking 20mg  of lasix daily to see if that controls swelling without causing cramps.  You could go back to 40mg  a day if needed.  Take care.  Glad to see you. You should get a call about seeing GI to discuss options.   Update me as needed.

## 2024-01-06 NOTE — Assessment & Plan Note (Signed)
 Advance directive- wife and/or son equally designated if patient were incapacitated.

## 2024-01-09 NOTE — Assessment & Plan Note (Signed)
 Seeing CHF clinic at baseline.   He has muscle cramping with daily use of 40mg  lasix.  D/w pt about trying 20mg  per day instead of 40mg .   Continue carvedilol digoxin doxazosin Jardiance Entresto and spironolactone.

## 2024-01-09 NOTE — Assessment & Plan Note (Signed)
 Some occasional bruising but not bleeding.  Continue anticoagulation as is.

## 2024-01-09 NOTE — Assessment & Plan Note (Signed)
 Continue pravastatin

## 2024-01-09 NOTE — Assessment & Plan Note (Signed)
 Vaccines d/w pt.   Advance directive- wife and/or son equally designated if patient were incapacitated. Cologuard 2024, it was positive, d/w pt about GI eval.  Referral placed.   Prostate cancer screening and PSA options (with potential risks and benefits of testing vs not testing) were discussed along with recent recs/guidelines.  He declined testing PSA at this point. Diet and exercise d/w pt.

## 2024-01-09 NOTE — Assessment & Plan Note (Signed)
Continue levothyroxine as is. 

## 2024-02-12 ENCOUNTER — Encounter: Payer: Self-pay | Admitting: Internal Medicine

## 2024-02-14 ENCOUNTER — Other Ambulatory Visit: Payer: Self-pay | Admitting: *Deleted

## 2024-02-14 MED ORDER — APIXABAN 5 MG PO TABS
5.0000 mg | ORAL_TABLET | Freq: Two times a day (BID) | ORAL | 1 refills | Status: DC
Start: 2024-02-14 — End: 2024-08-16

## 2024-02-14 NOTE — Telephone Encounter (Signed)
 Prescription refill request for Eliquis  received. Indication: AF Last office visit: 12/14/23  Norberta Beans MD Scr: 1.33 on 12/30/23  Epic Age: 78 Weight: 75.8kg  Based on above findings Eliquis  5mg  twice daily is the appropriate dose.  Refill approved.

## 2024-02-21 ENCOUNTER — Other Ambulatory Visit: Payer: Self-pay | Admitting: Family Medicine

## 2024-02-27 ENCOUNTER — Encounter: Payer: Self-pay | Admitting: Family Medicine

## 2024-03-01 ENCOUNTER — Other Ambulatory Visit: Payer: Self-pay | Admitting: Family Medicine

## 2024-03-01 MED ORDER — SILDENAFIL CITRATE 100 MG PO TABS: 100.0000 mg | ORAL_TABLET | Freq: Every day | ORAL | 12 refills | Status: DC | PRN

## 2024-03-08 ENCOUNTER — Telehealth: Payer: Self-pay | Admitting: Cardiology

## 2024-03-08 NOTE — Telephone Encounter (Signed)
 Called to confirm/remind patient of their appointment at the [facility] on 03/09/24.   Appointment:   [x] Confirmed  [] Left mess   [] No answer/No voice mail  [] VM Full/unable to leave message  [] Phone not in service  Patient reminded to bring all medications and/or complete list.  Confirmed patient has transportation. Gave directions, instructed to utilize valet parking.

## 2024-03-09 ENCOUNTER — Ambulatory Visit: Attending: Cardiology | Admitting: Cardiology

## 2024-03-09 VITALS — BP 105/68 | HR 75 | Wt 160.0 lb

## 2024-03-09 DIAGNOSIS — Z79899 Other long term (current) drug therapy: Secondary | ICD-10-CM | POA: Diagnosis not present

## 2024-03-09 DIAGNOSIS — I4821 Permanent atrial fibrillation: Secondary | ICD-10-CM | POA: Diagnosis not present

## 2024-03-09 DIAGNOSIS — I5022 Chronic systolic (congestive) heart failure: Secondary | ICD-10-CM | POA: Diagnosis not present

## 2024-03-09 DIAGNOSIS — Z7984 Long term (current) use of oral hypoglycemic drugs: Secondary | ICD-10-CM | POA: Insufficient documentation

## 2024-03-09 DIAGNOSIS — I340 Nonrheumatic mitral (valve) insufficiency: Secondary | ICD-10-CM | POA: Diagnosis not present

## 2024-03-09 DIAGNOSIS — Z7901 Long term (current) use of anticoagulants: Secondary | ICD-10-CM | POA: Diagnosis not present

## 2024-03-09 DIAGNOSIS — I2510 Atherosclerotic heart disease of native coronary artery without angina pectoris: Secondary | ICD-10-CM | POA: Diagnosis not present

## 2024-03-09 DIAGNOSIS — I429 Cardiomyopathy, unspecified: Secondary | ICD-10-CM | POA: Insufficient documentation

## 2024-03-09 NOTE — Patient Instructions (Signed)
 Medication Changes:  No medication changes today!  Follow-Up in: Please follow up with the [facility] in 4 months with Dr. Alease Amend. We do not currently have that schedule. Please give us  a call in September in order to schedule your appointment for October.   At the [facility], you and your health needs are our priority. We have a designated team specialized in the treatment of Heart Failure. This Care Team includes your primary Heart Failure Specialized Cardiologist (physician), Advanced Practice Providers (APPs- Physician Assistants and Nurse Practitioners), and Pharmacist who all work together to provide you with the care you need, when you need it.   You may see any of the following providers on your designated Care Team at your next follow up:  Dr. Jules Oar Dr. Peder Bourdon Dr. Alwin Baars Dr. Judyth Nunnery Shawnee Dellen, FNP Bevely Brush, RPH-CPP  Please be sure to bring in all your medications bottles to every appointment.   Need to Contact Us :  If you have any questions or concerns before your next appointment please send us  a message through [facility] or call our office at [phone].    TO LEAVE A MESSAGE FOR THE NURSE SELECT OPTION 2, PLEASE LEAVE A MESSAGE INCLUDING: YOUR NAME DATE OF BIRTH CALL BACK NUMBER REASON FOR CALL**this is important as we prioritize the call backs  YOU WILL RECEIVE A CALL BACK THE SAME DAY AS LONG AS YOU CALL BEFORE 4:00 PM

## 2024-03-09 NOTE — Progress Notes (Signed)
 ADVANCED HE[facility] FAILURE FOLLOW UP CLINIC NOTE  Referring Physician: Donnie Galea, MD  Primary Care: Donnie Galea, MD Primary Cardiologist:  HPI: [name] [name] is a 78 y.o. male who presents for follow up of chronic systolic heart failure.        Patient has a longstanding history of cardiomyopathy as well as atrial fibrillation.  He was seen by EP in 2018 but it was thought that secondary to left atrial enlargement at the time he would not be able to remain in sinus rhythm.  He has been followed by general cardiology with progressive worsening of his ejection fraction and worsening mitral regurgitation.      SUBJECTIVE:  Patient overall states that he is doing well. He continues to have minimal limitations and was working out in the yard the past two weeks without significnat symptoms. He does get tired more easily than he used to and occasionally will take mid afternoon naps. He continues to deny any orthopnea, PND, leg swelling.  Long conversation had concerning his mitral regurgitation. He has primarily secondary MR with a LVIDs of 6.7cm, borderline for clip candidacy. In the abscence of recent hospitalization or significant volume related symptoms it is not likely that clip would not lead to any significant improvement in LV function. Discussed with patient and he is in agreement.   PMH, current medications, allergies, social history, and family history reviewed in epic.  PHYSICAL EXAM: Vitals:   03/09/24 1021  BP: 105/68  Pulse: 75  SpO2: 99%   GENERAL: Well nourished and in no apparent distress at rest.  PULM:  Normal work of breathing, clear to auscultation bilaterally. Respirations are unlabored.  CARDIAC:  JVP: flat         Normal rate with regular rhythm. Soft systolic murmur.  No LE edema. Warm and well perfused extremities. ABDOMEN: Soft, non-tender, non-distended. NEUROLOGIC: Patient is oriented x3 with no focal or lateralizing neurologic deficits.     DATA REVIEW  ECG: 10/04/2023: A-fib, right bundle branch block, left anterior fascicular block   ECHO: 11/16/2023: LVEF 25 to 30%, moderate to severely dilated, mildly reduced RV function, severe biatrial dilation, moderate pulmonary hypertension, severe mitral regurgitation   CATH: (08/31/2023): LMCA with 20% distal stenosis. LAD with sequential 50% proximal and mid lesions. LCx with 30% mid stenosis, and proximal/mid RCA with 30% disease and 70% stenosis at the ostium of AM1. RA 11, RV 60/10, PA 60/28 (39), PCWP 23, LVEDP 20. Ao sat 95%, PA sat 61%. Fick CO/CI 4.7/2.4.    Heart failure review: - Classification: Heart failure with reduced EF - Etiology: Ischemic and Idiopathic - NYHA Class: II - Volume status: Euvolemic - ACEi/ARB/ARNI: Currently up-titrating - Aldosterone antagonist: Maximally tolerated dose - Beta-blocker: Maximally tolerated dose - Digoxin : Maximally tolerated dose - Hydralazine /Nitrates: Not indicated - SGLT2i: Maximally tolerated dose - GLP-1: Not a candidate - Advanced therapies: Not needed at this time - ICD: Would like to defer at this time  ASSESSMENT & PLAN:  Chronic systolic heart failure: Likely mixed ischemic/nonischemic, longstanding. Good medical therapy, unable to increase further given BP, heart rate. NYHA Class II and stable despite significant negative remodeling. As noted about suspect that he is outside the window of potential benefit from mitraclip, and his symptoms do not warrant TEE at this time. We did discuss that if his symptoms progress we would consider LVAD workup but not needed at this time. - Continue Entresto  24/26 mg twice daily, spironolactone  25 mg daily, carvedilol  25  mg twice daily, Jardiance  10 mg daily -Dig level at next visit for drug monitoring -Further titration limited by blood pressure -Continue lasix  40mg  daily, euvolemic -Would be a reasonable LVAD candidate if his LV function continues to deteriorate -Preserved  cardiac index on recent right heart cath, but if symptoms worsen would be reasonable to reduce carvedilol  - Previously declined ICD    Severe MR: Functional as above, does not appear out of proportion to degree of LV dysfunction and dilation. -Defer TEE and further workup at this time, close follow-up   Permanent atrial fibrillation: Longstanding, given mitral regurgitation atrial size is unlikely to return to sinus rhythm. -Continue abixaban 5mg  BID   CAD: Moderate disease noted on recent catheterization, no ischemic symptoms. -Continue apixaban , pravastatin   Follow up in 3 months  Arta Lark, MD Advanced Heart Failure Mechanical Circulatory Support 03/10/24

## 2024-03-10 ENCOUNTER — Encounter: Admitting: Cardiology

## 2024-06-10 ENCOUNTER — Other Ambulatory Visit: Payer: Self-pay | Admitting: Internal Medicine

## 2024-08-14 DIAGNOSIS — H524 Presbyopia: Secondary | ICD-10-CM | POA: Diagnosis not present

## 2024-08-14 DIAGNOSIS — H5203 Hypermetropia, bilateral: Secondary | ICD-10-CM | POA: Diagnosis not present

## 2024-08-14 DIAGNOSIS — H353131 Nonexudative age-related macular degeneration, bilateral, early dry stage: Secondary | ICD-10-CM | POA: Diagnosis not present

## 2024-08-14 DIAGNOSIS — H35372 Puckering of macula, left eye: Secondary | ICD-10-CM | POA: Diagnosis not present

## 2024-08-14 DIAGNOSIS — H52223 Regular astigmatism, bilateral: Secondary | ICD-10-CM | POA: Diagnosis not present

## 2024-08-14 DIAGNOSIS — H2513 Age-related nuclear cataract, bilateral: Secondary | ICD-10-CM | POA: Diagnosis not present

## 2024-08-14 LAB — OPHTHALMOLOGY REPORT-SCANNED

## 2024-08-16 ENCOUNTER — Encounter: Payer: Self-pay | Admitting: Internal Medicine

## 2024-08-16 ENCOUNTER — Other Ambulatory Visit: Payer: Self-pay | Admitting: Internal Medicine

## 2024-08-16 ENCOUNTER — Ambulatory Visit: Attending: Internal Medicine | Admitting: Internal Medicine

## 2024-08-16 VITALS — BP 115/58 | HR 68 | Ht 70.0 in | Wt 170.8 lb

## 2024-08-16 DIAGNOSIS — I5022 Chronic systolic (congestive) heart failure: Secondary | ICD-10-CM

## 2024-08-16 DIAGNOSIS — I2510 Atherosclerotic heart disease of native coronary artery without angina pectoris: Secondary | ICD-10-CM

## 2024-08-16 DIAGNOSIS — I428 Other cardiomyopathies: Secondary | ICD-10-CM | POA: Diagnosis not present

## 2024-08-16 DIAGNOSIS — I4821 Permanent atrial fibrillation: Secondary | ICD-10-CM | POA: Diagnosis not present

## 2024-08-16 NOTE — Telephone Encounter (Signed)
 Prescription refill request for Eliquis  received. Indication:afib Last office visit:6/25 Scr:1.33  4/25 Age: 78 Weight:72.6  kg  Prescription refilled

## 2024-08-16 NOTE — Patient Instructions (Signed)
 Medication Instructions:  No changes *If you need a refill on your cardiac medications before your next appointment, please call your pharmacy*  Lab Work: Your provider would like for you to have the following labs today: CBC anD BMET  If you have labs (blood work) drawn today and your tests are completely normal, you will receive your results only by: MyChart Message (if you have MyChart) OR A paper copy in the mail If you have any lab test that is abnormal or we need to change your treatment, we will call you to review the results.  Testing/Procedures: None ordered  Follow-Up: At [facility], you and your health needs are our priority.  As part of our continuing mission to provide you with exceptional heart care, our providers are all part of one team.  This team includes your primary Cardiologist (physician) and Advanced Practice Providers or APPs (Physician Assistants and Nurse Practitioners) who all work together to provide you with the care you need, when you need it.  Your next appointment:   3 month(s)  Provider:   Dr. Zenaida

## 2024-08-16 NOTE — Progress Notes (Signed)
 Cardiology Office Note:  .   Date:  08/18/2024  ID:  [name] [name] 03/17/1946, MRN [id number] PCP: [name] Arlyss RAMAN, MD  [facility] Providers Cardiologist:  [name] Hanson, MD     History of Present Illness: .   [name] [name] is a 78 y.o. male with history of permanent atrial fibrillation, chronic HFrEF due to nonischemic cardiomyopathy, nonobstructive coronary artery disease, hypertension, and hyperlipidemia, who presents for follow-up of HFrEF, CAD, and atrial fibrillation.  I last saw him in 10/2023, at which time he was feeling a little better after addition of empagliflozin .  He was subsequently seen in the advanced heart failure clinic by Dr. Zenaida, most recently in 02/2024.  It was felt that he was outside of the window of potential benefit from MitraClip.  He was continued on his current regimen of GDMT as borderline low blood pressure precluded further escalation.  Today, [name] [name] reports that he has been feeling fairly well.  He feels like he might be slowing down a little but is still able to do his normal activities, having to pace himself.  He denies chest pain, shortness of breath, and lightheadedness.  He endorses occasional transient palpitations as well as mild lower extremity edema, which has been stable.  He is currently using furosemide  about twice a week.  ROS: See HPI  Studies Reviewed: [name]   EKG Interpretation Date/Time:  Wednesday August 16 2024 14:23:00 EST Ventricular Rate:  68 PR Interval:    QRS Duration:  160 QT Interval:  400 QTC Calculation: 425 R Axis:   -75  Text Interpretation: Atrial fibrillation with premature ventricular or aberrantly conducted complexes Right bundle branch block Left anterior fascicular block Bifascicular block T wave abnormality, consider lateral ischemia Abnormal ECG When compared with ECG of 04-Oct-2023 10:36, No significant change was found Confirmed by [name] [name], [name] [phone]) on 08/18/2024 2:46:04 PM    TTE  (11/16/2023): Moderately to severely dilated left ventricle with normal wall thickness.  LVEF 25-30% with global hypokinesis.  GLS -5.5%.  Mildly dilated RV with moderately reduced function.  Moderate pulmonary hypertension.  Severe biatrial enlargement.  No pericardial effusion.  Mildly thickened mitral valve with moderate to severe regurgitation.  Moderate tricuspid regurgitation.  Structurally normal aortic valve with mild-moderate regurgitation.  Borderline dilation of ascending aorta (3.9 cm).  Mildly elevated CVP.  Risk Assessment/Calculations:    CHA2DS2-VASc Score = 5   This indicates a 7.2% annual risk of stroke. The patient's score is based upon: CHF History: 1 HTN History: 1 Diabetes History: 0 Stroke History: 0 Vascular Disease History: 1 Age Score: 2 Gender Score: 0           Physical Exam:   VS:  BP (!) 115/58 (BP Location: Left Arm, Patient Position: Sitting, Cuff Size: Normal)   Pulse 68 Comment: 46 oximeter  Ht 5' 10 (1.778 m)   Wt 170 lb 12.8 oz (77.5 kg)   SpO2 97%   BMI 24.51 kg/m    Wt Readings from Last 3 Encounters:  08/16/24 170 lb 12.8 oz (77.5 kg)  03/09/24 160 lb (72.6 kg)  01/06/24 166 lb 9.6 oz (75.6 kg)    General:  NAD. Neck: No JVD or HJR. Lungs: Clear to auscultation bilaterally without wheezes or crackles. Heart: Irregularly irregular rhythm with 2/6 systolic murmur. Abdomen: Soft, nontender, nondistended. Extremities: Trace ankle edema bilaterally.  ASSESSMENT AND PLAN: .    Chronic HFrEF due to nonischemic cardiomyopathy: [name] appears euvolemic on exam with  stable mild chronic ankle edema.  He has slowed down a little bit since our last visit but is still fairly active at home consistent with NYHA class II heart failure.  We are unable to escalate his GDMT further today because of persistently low normal blood pressure (albeit asymptomatic).  Dr. Zenaida did not feel like he would benefit from [facility].  He will continue to follow with Dr.  Zenaida in the heart failure clinic.  I will check a BMP today to ensure stable renal function and electrolytes.  Nonobstructive coronary artery disease: No angina reported.  Continue pravastatin  under the direction of Dr. Cleatus as well as apixaban  and lieu of aspirin  in the setting of permanent atrial fibrillation.  Permanent atrial fibrillation: Ventricular rates adequately controlled today.  Continue apixaban  for stroke prevention as well as carvedilol  and digoxin  for rate control.  We will check a CBC and BMP today.    Dispo: Follow-up with Dr. Zenaida in 3 months.  He can return to see me on an as-needed basis.  Signed, [name] Hanson, MD

## 2024-08-17 LAB — CBC
Hematocrit: 41.4 % (ref 37.5–51.0)
Hemoglobin: 13.5 g/dL (ref 13.0–17.7)
MCH: 32.6 pg (ref 26.6–33.0)
MCHC: 32.6 g/dL (ref 31.5–35.7)
MCV: 100 fL — ABNORMAL HIGH (ref 79–97)
Platelets: 120 x10E3/uL — ABNORMAL LOW (ref 150–450)
RBC: 4.14 x10E6/uL (ref 4.14–5.80)
RDW: 12.6 % (ref 11.6–15.4)
WBC: 10.3 x10E3/uL (ref 3.4–10.8)

## 2024-08-17 LAB — BASIC METABOLIC PANEL WITH GFR
BUN/Creatinine Ratio: 15 (ref 10–24)
BUN: 20 mg/dL (ref 8–27)
CO2: 22 mmol/L (ref 20–29)
Calcium: 9.4 mg/dL (ref 8.6–10.2)
Chloride: 100 mmol/L (ref 96–106)
Creatinine, Ser: 1.37 mg/dL — ABNORMAL HIGH (ref 0.76–1.27)
Glucose: 96 mg/dL (ref 70–99)
Potassium: 4.4 mmol/L (ref 3.5–5.2)
Sodium: 138 mmol/L (ref 134–144)
eGFR: 53 mL/min/1.73 — ABNORMAL LOW (ref >59)

## 2024-08-18 ENCOUNTER — Ambulatory Visit: Payer: Self-pay | Admitting: Internal Medicine

## 2024-08-18 ENCOUNTER — Other Ambulatory Visit: Payer: Self-pay | Admitting: Family Medicine

## 2024-08-18 ENCOUNTER — Encounter: Payer: Self-pay | Admitting: Internal Medicine

## 2024-08-18 DIAGNOSIS — R059 Cough, unspecified: Secondary | ICD-10-CM

## 2024-08-18 NOTE — Telephone Encounter (Signed)
 Advair Last OV:  01/06/24, annual exam Next OV:  none

## 2024-08-21 ENCOUNTER — Encounter: Admitting: Cardiology

## 2024-08-27 ENCOUNTER — Ambulatory Visit
Admission: EM | Admit: 2024-08-27 | Discharge: 2024-08-27 | Disposition: A | Discharge status: Home / Self Care | Attending: Student | Admitting: Student

## 2024-08-27 ENCOUNTER — Observation Stay (HOSPITAL_COMMUNITY)
Admission: EM | Admit: 2024-08-27 | Discharge: 2024-09-28 | DRG: 001 | Disposition: E | Attending: Cardiology | Admitting: Cardiology

## 2024-08-27 ENCOUNTER — Other Ambulatory Visit: Payer: Self-pay

## 2024-08-27 ENCOUNTER — Emergency Department (HOSPITAL_COMMUNITY)

## 2024-08-27 ENCOUNTER — Encounter (HOSPITAL_COMMUNITY): Payer: Self-pay | Admitting: Internal Medicine

## 2024-08-27 DIAGNOSIS — N179 Acute kidney failure, unspecified: Secondary | ICD-10-CM

## 2024-08-27 DIAGNOSIS — E872 Acidosis, unspecified: Secondary | ICD-10-CM | POA: Diagnosis not present

## 2024-08-27 DIAGNOSIS — R062 Wheezing: Secondary | ICD-10-CM | POA: Diagnosis not present

## 2024-08-27 DIAGNOSIS — I5084 End stage heart failure: Secondary | ICD-10-CM | POA: Diagnosis not present

## 2024-08-27 DIAGNOSIS — R739 Hyperglycemia, unspecified: Secondary | ICD-10-CM | POA: Diagnosis present

## 2024-08-27 DIAGNOSIS — R197 Diarrhea, unspecified: Secondary | ICD-10-CM | POA: Diagnosis not present

## 2024-08-27 DIAGNOSIS — I130 Hypertensive heart and chronic kidney disease with heart failure and stage 1 through stage 4 chronic kidney disease, or unspecified chronic kidney disease: Principal | ICD-10-CM | POA: Diagnosis present

## 2024-08-27 DIAGNOSIS — I4821 Permanent atrial fibrillation: Secondary | ICD-10-CM | POA: Diagnosis present

## 2024-08-27 DIAGNOSIS — Z85820 Personal history of malignant melanoma of skin: Secondary | ICD-10-CM

## 2024-08-27 DIAGNOSIS — E785 Hyperlipidemia, unspecified: Secondary | ICD-10-CM | POA: Diagnosis present

## 2024-08-27 DIAGNOSIS — I4891 Unspecified atrial fibrillation: Secondary | ICD-10-CM | POA: Diagnosis not present

## 2024-08-27 DIAGNOSIS — Z8249 Family history of ischemic heart disease and other diseases of the circulatory system: Secondary | ICD-10-CM

## 2024-08-27 DIAGNOSIS — I428 Other cardiomyopathies: Secondary | ICD-10-CM | POA: Diagnosis not present

## 2024-08-27 DIAGNOSIS — N400 Enlarged prostate without lower urinary tract symptoms: Secondary | ICD-10-CM | POA: Diagnosis present

## 2024-08-27 DIAGNOSIS — Z87891 Personal history of nicotine dependence: Secondary | ICD-10-CM

## 2024-08-27 DIAGNOSIS — Z79890 Hormone replacement therapy (postmenopausal): Secondary | ICD-10-CM

## 2024-08-27 DIAGNOSIS — D72829 Elevated white blood cell count, unspecified: Secondary | ICD-10-CM | POA: Diagnosis not present

## 2024-08-27 DIAGNOSIS — D696 Thrombocytopenia, unspecified: Secondary | ICD-10-CM | POA: Diagnosis present

## 2024-08-27 DIAGNOSIS — Z7189 Other specified counseling: Secondary | ICD-10-CM | POA: Diagnosis not present

## 2024-08-27 DIAGNOSIS — Z7984 Long term (current) use of oral hypoglycemic drugs: Secondary | ICD-10-CM

## 2024-08-27 DIAGNOSIS — Z7901 Long term (current) use of anticoagulants: Secondary | ICD-10-CM

## 2024-08-27 DIAGNOSIS — Y829 Unspecified medical devices associated with adverse incidents: Secondary | ICD-10-CM | POA: Diagnosis not present

## 2024-08-27 DIAGNOSIS — I5023 Acute on chronic systolic (congestive) heart failure: Secondary | ICD-10-CM | POA: Diagnosis not present

## 2024-08-27 DIAGNOSIS — K761 Chronic passive congestion of liver: Secondary | ICD-10-CM | POA: Diagnosis present

## 2024-08-27 DIAGNOSIS — Z1152 Encounter for screening for COVID-19: Secondary | ICD-10-CM

## 2024-08-27 DIAGNOSIS — R069 Unspecified abnormalities of breathing: Secondary | ICD-10-CM | POA: Diagnosis not present

## 2024-08-27 DIAGNOSIS — I5082 Biventricular heart failure: Secondary | ICD-10-CM | POA: Diagnosis present

## 2024-08-27 DIAGNOSIS — I472 Ventricular tachycardia, unspecified: Secondary | ICD-10-CM | POA: Diagnosis not present

## 2024-08-27 DIAGNOSIS — D649 Anemia, unspecified: Secondary | ICD-10-CM | POA: Diagnosis not present

## 2024-08-27 DIAGNOSIS — R110 Nausea: Secondary | ICD-10-CM | POA: Diagnosis not present

## 2024-08-27 DIAGNOSIS — T8383XA Hemorrhage of genitourinary prosthetic devices, implants and grafts, initial encounter: Secondary | ICD-10-CM | POA: Diagnosis not present

## 2024-08-27 DIAGNOSIS — I5043 Acute on chronic combined systolic (congestive) and diastolic (congestive) heart failure: Secondary | ICD-10-CM | POA: Diagnosis present

## 2024-08-27 DIAGNOSIS — Z79899 Other long term (current) drug therapy: Secondary | ICD-10-CM

## 2024-08-27 DIAGNOSIS — I700 Atherosclerosis of aorta: Secondary | ICD-10-CM | POA: Diagnosis not present

## 2024-08-27 DIAGNOSIS — E782 Mixed hyperlipidemia: Secondary | ICD-10-CM

## 2024-08-27 DIAGNOSIS — E876 Hypokalemia: Secondary | ICD-10-CM | POA: Diagnosis not present

## 2024-08-27 DIAGNOSIS — R0602 Shortness of breath: Secondary | ICD-10-CM | POA: Diagnosis not present

## 2024-08-27 DIAGNOSIS — N419 Inflammatory disease of prostate, unspecified: Secondary | ICD-10-CM | POA: Diagnosis present

## 2024-08-27 DIAGNOSIS — I509 Heart failure, unspecified: Secondary | ICD-10-CM | POA: Diagnosis not present

## 2024-08-27 DIAGNOSIS — Z4682 Encounter for fitting and adjustment of non-vascular catheter: Secondary | ICD-10-CM | POA: Diagnosis not present

## 2024-08-27 DIAGNOSIS — I517 Cardiomegaly: Secondary | ICD-10-CM | POA: Diagnosis not present

## 2024-08-27 DIAGNOSIS — E875 Hyperkalemia: Secondary | ICD-10-CM | POA: Diagnosis not present

## 2024-08-27 DIAGNOSIS — Z807 Family history of other malignant neoplasms of lymphoid, hematopoietic and related tissues: Secondary | ICD-10-CM

## 2024-08-27 DIAGNOSIS — I2510 Atherosclerotic heart disease of native coronary artery without angina pectoris: Secondary | ICD-10-CM | POA: Diagnosis present

## 2024-08-27 DIAGNOSIS — J4541 Moderate persistent asthma with (acute) exacerbation: Secondary | ICD-10-CM

## 2024-08-27 DIAGNOSIS — I110 Hypertensive heart disease with heart failure: Secondary | ICD-10-CM | POA: Diagnosis not present

## 2024-08-27 DIAGNOSIS — R000 Tachycardia, unspecified: Secondary | ICD-10-CM | POA: Diagnosis not present

## 2024-08-27 DIAGNOSIS — N184 Chronic kidney disease, stage 4 (severe): Secondary | ICD-10-CM | POA: Diagnosis present

## 2024-08-27 DIAGNOSIS — R570 Cardiogenic shock: Secondary | ICD-10-CM | POA: Diagnosis not present

## 2024-08-27 DIAGNOSIS — I2699 Other pulmonary embolism without acute cor pulmonale: Secondary | ICD-10-CM | POA: Diagnosis not present

## 2024-08-27 DIAGNOSIS — E8729 Other acidosis: Secondary | ICD-10-CM | POA: Diagnosis present

## 2024-08-27 DIAGNOSIS — J4489 Other specified chronic obstructive pulmonary disease: Secondary | ICD-10-CM | POA: Diagnosis present

## 2024-08-27 DIAGNOSIS — K7030 Alcoholic cirrhosis of liver without ascites: Secondary | ICD-10-CM | POA: Diagnosis present

## 2024-08-27 DIAGNOSIS — I083 Combined rheumatic disorders of mitral, aortic and tricuspid valves: Secondary | ICD-10-CM | POA: Diagnosis present

## 2024-08-27 DIAGNOSIS — Z515 Encounter for palliative care: Secondary | ICD-10-CM | POA: Diagnosis not present

## 2024-08-27 DIAGNOSIS — I447 Left bundle-branch block, unspecified: Secondary | ICD-10-CM | POA: Diagnosis not present

## 2024-08-27 DIAGNOSIS — I272 Pulmonary hypertension, unspecified: Secondary | ICD-10-CM | POA: Diagnosis present

## 2024-08-27 DIAGNOSIS — Z800 Family history of malignant neoplasm of digestive organs: Secondary | ICD-10-CM

## 2024-08-27 DIAGNOSIS — I340 Nonrheumatic mitral (valve) insufficiency: Secondary | ICD-10-CM | POA: Diagnosis not present

## 2024-08-27 DIAGNOSIS — Z83719 Family history of colon polyps, unspecified: Secondary | ICD-10-CM

## 2024-08-27 DIAGNOSIS — R972 Elevated prostate specific antigen [PSA]: Secondary | ICD-10-CM | POA: Diagnosis present

## 2024-08-27 DIAGNOSIS — Z83438 Family history of other disorder of lipoprotein metabolism and other lipidemia: Secondary | ICD-10-CM

## 2024-08-27 DIAGNOSIS — I252 Old myocardial infarction: Secondary | ICD-10-CM

## 2024-08-27 DIAGNOSIS — E871 Hypo-osmolality and hyponatremia: Secondary | ICD-10-CM | POA: Diagnosis present

## 2024-08-27 DIAGNOSIS — E039 Hypothyroidism, unspecified: Secondary | ICD-10-CM | POA: Diagnosis not present

## 2024-08-27 DIAGNOSIS — I480 Paroxysmal atrial fibrillation: Secondary | ICD-10-CM | POA: Diagnosis not present

## 2024-08-27 DIAGNOSIS — Z9911 Dependence on respirator [ventilator] status: Secondary | ICD-10-CM

## 2024-08-27 DIAGNOSIS — R0989 Other specified symptoms and signs involving the circulatory and respiratory systems: Secondary | ICD-10-CM | POA: Diagnosis not present

## 2024-08-27 DIAGNOSIS — I255 Ischemic cardiomyopathy: Secondary | ICD-10-CM | POA: Diagnosis present

## 2024-08-27 DIAGNOSIS — Z7951 Long term (current) use of inhaled steroids: Secondary | ICD-10-CM

## 2024-08-27 LAB — BASIC METABOLIC PANEL WITH GFR
Anion gap: 17 — ABNORMAL HIGH (ref 5–15)
BUN: 16 mg/dL (ref 8–23)
CO2: 19 mmol/L — ABNORMAL LOW (ref 22–32)
Calcium: 9.2 mg/dL (ref 8.9–10.3)
Chloride: 98 mmol/L (ref 98–111)
Creatinine, Ser: 1.49 mg/dL — ABNORMAL HIGH (ref 0.61–1.24)
GFR, Estimated: 48 mL/min — ABNORMAL LOW (ref >60)
Glucose, Bld: 189 mg/dL — ABNORMAL HIGH (ref 70–99)
Potassium: 5.5 mmol/L — ABNORMAL HIGH (ref 3.5–5.1)
Sodium: 134 mmol/L — ABNORMAL LOW (ref 135–145)

## 2024-08-27 LAB — CBC WITH DIFFERENTIAL/PLATELET
Abs Immature Granulocytes: 0.04 K/uL (ref 0.00–0.07)
Basophils Absolute: 0 K/uL (ref 0.0–0.1)
Basophils Relative: 0 %
Eosinophils Absolute: 0 K/uL (ref 0.0–0.5)
Eosinophils Relative: 0 %
HCT: 47.4 % (ref 39.0–52.0)
Hemoglobin: 15.9 g/dL (ref 13.0–17.0)
Immature Granulocytes: 1 %
Lymphocytes Relative: 9 %
Lymphs Abs: 0.7 K/uL (ref 0.7–4.0)
MCH: 33.3 pg (ref 26.0–34.0)
MCHC: 33.5 g/dL (ref 30.0–36.0)
MCV: 99.2 fL (ref 80.0–100.0)
Monocytes Absolute: 0.2 K/uL (ref 0.1–1.0)
Monocytes Relative: 3 %
Neutro Abs: 7.2 K/uL (ref 1.7–7.7)
Neutrophils Relative %: 87 %
Platelets: 121 K/uL — ABNORMAL LOW (ref 150–400)
RBC: 4.78 MIL/uL (ref 4.22–5.81)
RDW: 13.1 % (ref 11.5–15.5)
WBC: 8.2 K/uL (ref 4.0–10.5)
nRBC: 0 % (ref 0.0–0.2)

## 2024-08-27 LAB — TROPONIN I (HIGH SENSITIVITY)
Troponin I (High Sensitivity): 11 ng/L (ref <18)
Troponin I (High Sensitivity): 15 ng/L (ref <18)

## 2024-08-27 LAB — URINALYSIS, MICROSCOPIC (REFLEX): Bacteria, UA: NONE SEEN

## 2024-08-27 LAB — RESP PANEL BY RT-PCR (RSV, FLU A&B, COVID)  RVPGX2
Influenza A by PCR: NEGATIVE
Influenza B by PCR: NEGATIVE
Resp Syncytial Virus by PCR: NEGATIVE
SARS Coronavirus 2 by RT PCR: NEGATIVE

## 2024-08-27 LAB — URINALYSIS, ROUTINE W REFLEX MICROSCOPIC
Bilirubin Urine: NEGATIVE
Glucose, UA: >=500 mg/dL — AB
Ketones, ur: 15 mg/dL — AB
Leukocytes,Ua: NEGATIVE
Nitrite: NEGATIVE
Protein, ur: NEGATIVE mg/dL
Specific Gravity, Urine: 1.01 (ref 1.005–1.030)
pH: 5.5 (ref 5.0–8.0)

## 2024-08-27 LAB — I-STAT CG4 LACTIC ACID, ED
Lactic Acid, Venous: 2 mmol/L (ref 0.5–1.9)
Lactic Acid, Venous: 2 mmol/L (ref 0.5–1.9)

## 2024-08-27 LAB — MAGNESIUM: Magnesium: 2.9 mg/dL — ABNORMAL HIGH (ref 1.7–2.4)

## 2024-08-27 LAB — BRAIN NATRIURETIC PEPTIDE: B Natriuretic Peptide: 1633.9 pg/mL — ABNORMAL HIGH (ref 0.0–100.0)

## 2024-08-27 MED ORDER — PREDNISONE 10 MG PO TABS: 20.0000 mg | ORAL_TABLET | Freq: Every day | ORAL | 0 refills | Status: AC

## 2024-08-27 MED ORDER — ACETAMINOPHEN 325 MG PO TABS
650.0000 mg | ORAL_TABLET | Freq: Four times a day (QID) | ORAL | Status: DC | PRN
  Administered 2024-09-02: 650 mg via ORAL
  Filled 2024-08-27: qty 2

## 2024-08-27 MED ORDER — FUROSEMIDE 10 MG/ML IJ SOLN
40.0000 mg | Freq: Two times a day (BID) | INTRAMUSCULAR | Status: DC
  Administered 2024-08-28: 40 mg via INTRAVENOUS
  Filled 2024-08-27: qty 4

## 2024-08-27 MED ORDER — ONDANSETRON HCL 4 MG/2ML IJ SOLN: 4.0000 mg | Freq: Four times a day (QID) | INTRAMUSCULAR | Status: DC | PRN

## 2024-08-27 MED ORDER — LEVOTHYROXINE SODIUM 25 MCG PO TABS
137.0000 ug | ORAL_TABLET | Freq: Every day | ORAL | Status: DC
  Administered 2024-08-28 – 2024-09-05 (×8): 137 ug via ORAL
  Filled 2024-08-27 (×8): qty 1

## 2024-08-27 MED ORDER — ACETAMINOPHEN 650 MG RE SUPP: 650.0000 mg | Freq: Four times a day (QID) | RECTAL | Status: DC | PRN

## 2024-08-27 MED ORDER — CARVEDILOL 25 MG PO TABS
25.0000 mg | ORAL_TABLET | Freq: Two times a day (BID) | ORAL | Status: DC
  Administered 2024-08-28 – 2024-08-29 (×3): 25 mg via ORAL
  Filled 2024-08-27 (×3): qty 1

## 2024-08-27 MED ORDER — LEVALBUTEROL HCL 0.63 MG/3ML IN NEBU
0.6300 mg | INHALATION_SOLUTION | Freq: Once | RESPIRATORY_TRACT | Status: AC
  Administered 2024-08-27: 0.63 mg via RESPIRATORY_TRACT
  Filled 2024-08-27: qty 3

## 2024-08-27 MED ORDER — DIGOXIN 125 MCG PO TABS
0.1250 mg | ORAL_TABLET | Freq: Every day | ORAL | Status: DC
  Administered 2024-08-28 – 2024-08-29 (×2): 0.125 mg via ORAL
  Filled 2024-08-27 (×2): qty 1

## 2024-08-27 MED ORDER — PRAVASTATIN SODIUM 40 MG PO TABS
40.0000 mg | ORAL_TABLET | Freq: Every day | ORAL | Status: DC
  Administered 2024-08-28 – 2024-09-04 (×7): 40 mg via ORAL
  Filled 2024-08-27 (×7): qty 1

## 2024-08-27 MED ORDER — MELATONIN 3 MG PO TABS
3.0000 mg | ORAL_TABLET | Freq: Every evening | ORAL | Status: DC | PRN
  Administered 2024-09-02 – 2024-09-03 (×2): 3 mg via ORAL
  Filled 2024-08-27 (×2): qty 1

## 2024-08-27 MED ORDER — IPRATROPIUM-ALBUTEROL 0.5-2.5 (3) MG/3ML IN SOLN
3.0000 mL | Freq: Once | RESPIRATORY_TRACT | Status: AC
  Administered 2024-08-27: 3 mL via RESPIRATORY_TRACT

## 2024-08-27 MED ORDER — AMOXICILLIN-POT CLAVULANATE 875-125 MG PO TABS: 1.0000 | ORAL_TABLET | Freq: Two times a day (BID) | ORAL | 0 refills | Status: DC

## 2024-08-27 MED ORDER — ALBUTEROL SULFATE HFA 108 (90 BASE) MCG/ACT IN AERS: 2.0000 | INHALATION_SPRAY | RESPIRATORY_TRACT | 0 refills | Status: DC | PRN

## 2024-08-27 MED ORDER — METOPROLOL TARTRATE 5 MG/5ML IV SOLN
5.0000 mg | INTRAVENOUS | Status: DC | PRN
  Administered 2024-08-27 – 2024-08-28 (×2): 5 mg via INTRAVENOUS
  Filled 2024-08-27 (×2): qty 5

## 2024-08-27 MED ORDER — METOPROLOL TARTRATE 5 MG/5ML IV SOLN
5.0000 mg | Freq: Once | INTRAVENOUS | Status: AC
  Administered 2024-08-27: 5 mg via INTRAVENOUS
  Filled 2024-08-27: qty 5

## 2024-08-27 MED ORDER — FUROSEMIDE 10 MG/ML IJ SOLN
60.0000 mg | Freq: Once | INTRAMUSCULAR | Status: AC
  Administered 2024-08-27: 60 mg via INTRAVENOUS
  Filled 2024-08-27: qty 6

## 2024-08-27 MED ORDER — SODIUM ZIRCONIUM CYCLOSILICATE 10 G PO PACK
10.0000 g | PACK | Freq: Once | ORAL | Status: AC
  Administered 2024-08-28: 10 g via ORAL
  Filled 2024-08-27: qty 1

## 2024-08-27 MED ORDER — APIXABAN 5 MG PO TABS
5.0000 mg | ORAL_TABLET | Freq: Two times a day (BID) | ORAL | Status: DC
  Administered 2024-08-28 – 2024-09-03 (×14): 5 mg via ORAL
  Filled 2024-08-27 (×14): qty 1

## 2024-08-27 NOTE — ED Triage Notes (Signed)
 PT states cough and wheezing x 3 days. Patient has been cough up mucous. No fever or diarrhea.

## 2024-08-27 NOTE — ED Provider Notes (Signed)
 [name] [name]    CSN: [id number] Arrival date & time: 08/27/24  9045      History   Chief Complaint Chief Complaint  Patient presents with   Cough   Wheezing   Shortness of Breath    HPI [name] [name] is a 78 y.o. male presenting for cough, congestion, and wheezing.  He has a history of CHF, A-fib, myocardial infarction, cirrhosis, hypertension. PT states cough and wheezing x 3 days. Cough is nonproductive Symptoms got worse overnight. History childhood asthma; former smoker; Used expired Albuterol  this morning and his Advair, which he uses bid No fever or diarrhea. Former smoker.   HPI  Past Medical History:  Diagnosis Date   Atrial fibrillation (HCC)    CHF (congestive heart failure) (HCC)    Cirrhosis (HCC)    ED (erectile dysfunction)    Hyperglycemia    Hyperlipidemia    Hypertension    Internal hemorrhoids    Myocardial infarction (HCC) 2008   SCCA (squamous cell carcinoma) of skin    Left ear.  MOHS surgery 2014 [facility]   Skin cancer of face    2012 basal cell   Thyroid  disease     Patient Active Problem List   Diagnosis Date Noted   Coronary artery disease involving native coronary artery of native heart without angina pectoris 10/04/2023   Nonischemic cardiomyopathy (HCC) 10/04/2023   Mitral valve insufficiency 08/31/2023   Chronic HFrEF (heart failure with reduced ejection fraction) (HCC) 04/07/2023   SOB (shortness of breath) 01/16/2022   Melanoma of skin (HCC) 12/28/2021   Wheezing 10/30/2021   Cardiomegaly 10/10/2021   Skin lesion 12/20/2019   Cirrhosis (HCC) 12/14/2018   Health care maintenance 12/10/2017   Thrombocytopenia 12/10/2017   Cough 10/03/2015   Advance care planning 11/02/2014   Other malaise and fatigue 06/17/2014   Hypothyroidism 11/05/2013   SCCA (squamous cell carcinoma) of skin 07/04/2013   Medicare annual wellness visit, subsequent 08/18/2011   BPH (benign prostatic hyperplasia) 08/18/2011   COLONIC POLYPS  01/27/2010   TENDINITIS, LEFT THUMB 07/30/2009   Hyperglycemia 01/22/2009   HYPERCHOLESTEROLEMIA, MIXED 04/03/2008   NEOPLASM OF UNCERTAIN BEHAVIOR OF SKIN 08/23/2007   HYPERTENSION, BENIGN ESSENTIAL 02/01/2007   ERECTILE DYSFUNCTION 01/12/2007   Atrial fibrillation (HCC) 01/12/2007   Congestive heart failure (HCC) 01/12/2007    Past Surgical History:  Procedure Laterality Date   A-Fib Cardiomyopathy ([facility])  10/2006   EF 25 - 35%   DOPPLER ECHOCARDIOGRAPHY  10/31/2006   EF 25-35% Mod. M.R., mild -mod TR   RIGHT/LEFT HEART CATH AND CORONARY ANGIOGRAPHY Bilateral 08/31/2023   Procedure: RIGHT/LEFT HEART CATH AND CORONARY ANGIOGRAPHY;  Surgeon: Mady Bruckner, MD;  Location: [facility] INVASIVE CV LAB;  Service: Cardiovascular;  Laterality: Bilateral;       Home Medications    Prior to Admission medications   Medication Sig Start Date End Date Taking? Authorizing Provider  albuterol  (VENTOLIN  HFA) 108 (90 Base) MCG/ACT inhaler Inhale 2 puffs into the lungs every 4 (four) hours as needed for wheezing or shortness of breath. 08/27/24  Yes Halana Deisher E, PA-C  amoxicillin -clavulanate (AUGMENTIN ) 875-125 MG tablet Take 1 tablet by mouth every 12 (twelve) hours. 08/27/24  Yes Traxton Kolenda E, PA-C  apixaban  (ELIQUIS ) 5 MG TABS tablet Take 1 tablet by mouth twice daily 08/16/24  Yes End, Bruckner, MD  carvedilol  (COREG ) 25 MG tablet TAKE 1 TABLET BY MOUTH TWICE DAILY WITH A MEAL 06/12/24  Yes End, Bruckner, MD  digoxin  (LANOXIN ) 0.125 MG  tablet Take 0.125 mg by mouth daily.   Yes [provider]  doxazosin  (CARDURA ) 4 MG tablet Take 1 tablet (4 mg total) by mouth at bedtime. 01/06/24  Yes Cleatus Arlyss RAMAN, MD  empagliflozin  (JARDIANCE ) 10 MG TABS tablet Take 1 tablet (10 mg total) by mouth daily before breakfast. 10/04/23  Yes End, Lonni, MD  ENTRESTO  24-26 MG Take 1 tablet by mouth 2 (two) times daily. 12/12/18  Yes Cleatus Arlyss RAMAN, MD  fish oil-omega-3 fatty acids 1000 MG  capsule Take 1 g by mouth daily.   Yes [provider]  fluticasone -salmeterol (ADVAIR) 250-50 MCG/ACT AEPB INHALE 1 DOSE BY MOUTH IN THE MORNING AND AT BEDTIME RINSE MOUTH AFTER USE 08/20/24  Yes Cleatus Arlyss RAMAN, MD  furosemide  (LASIX ) 20 MG tablet Take 1-2 tablets (20-40 mg total) by mouth daily. 01/06/24  Yes Cleatus Arlyss RAMAN, MD  levothyroxine  (SYNTHROID ) 137 MCG tablet Take 1 tablet (137 mcg total) by mouth daily before breakfast. 01/06/24  Yes Cleatus Arlyss RAMAN, MD  Melatonin 5 MG CAPS Take 5 mg by mouth daily.  12/18/14  Yes [provider]  Multiple Vitamin (MULTIVITAMIN) tablet Take 1 tablet by mouth daily.   Yes [provider]  pravastatin  (PRAVACHOL ) 40 MG tablet Take 1 tablet (40 mg total) by mouth daily. 01/06/24  Yes Cleatus Arlyss RAMAN, MD  predniSONE  (DELTASONE ) 10 MG tablet Take 2 tablets (20 mg total) by mouth daily for 5 days. 08/27/24 09/01/24 Yes Zora Glendenning E, PA-C  sildenafil  (VIAGRA ) 100 MG tablet Take 1 tablet (100 mg total) by mouth daily as needed for erectile dysfunction. 03/01/24  Yes Cleatus Arlyss RAMAN, MD  spironolactone  (ALDACTONE ) 25 MG tablet Take 1 tablet (25 mg total) by mouth daily. 01/06/24  Yes Cleatus Arlyss RAMAN, MD    Family History Family History  Problem Relation Age of Onset   Hypertension Mother    Cancer Mother        Bilat. breast dz, s/p mastectomy, Bilat   Colon cancer Mother        possible   Colon polyps Mother    Heart disease Father        CHF, S/P CABG (27) Pacer Incr BP   Hyperlipidemia Father    Heart disease Brother 72       CAD- stent and ICD/pacer   Prostate cancer Neg Hx     Social History Social History   Tobacco Use   Smoking status: Former    Current packs/day: 0.00    Average packs/day: 0.3 packs/day for 1 year (0.3 ttl pk-yrs)    Types: Cigarettes    Quit date: 02/26/1966    Years since quitting: 58.5   Smokeless tobacco: Never   Tobacco comments:    Less than six months  Vaping Use   Vaping  status: Never Used  Substance Use Topics   Alcohol use: Not Currently    Comment: 3-4 glasses of wine per week.   Drug use: Never     Allergies   Patient has no known allergies.   Review of Systems Review of Systems  Constitutional:  Negative for appetite change, chills and fever.  HENT:  Positive for congestion. Negative for ear pain, rhinorrhea, sinus pressure, sinus pain and sore throat.   Eyes:  Negative for redness and visual disturbance.  Respiratory:  Positive for cough. Negative for chest tightness, shortness of breath and wheezing.   Cardiovascular:  Negative for chest pain and palpitations.  Gastrointestinal:  Negative for abdominal  pain, constipation, diarrhea, nausea and vomiting.  Genitourinary:  Negative for dysuria, frequency and urgency.  Musculoskeletal:  Negative for myalgias.  Neurological:  Negative for dizziness, weakness and headaches.  Psychiatric/Behavioral:  Negative for confusion.   All other systems reviewed and are negative.    Physical Exam Triage Vital Signs ED Triage Vitals  Encounter Vitals Group     BP      Girls Systolic BP Percentile      Girls Diastolic BP Percentile      Boys Systolic BP Percentile      Boys Diastolic BP Percentile      Pulse      Resp      Temp      Temp src      SpO2      Weight      Height      Head Circumference      Peak Flow      Pain Score      Pain Loc      Pain Education      Exclude from Growth Chart    No data found.  Updated Vital Signs BP 130/77 (BP Location: Left Arm)   Pulse 94   Temp (!) 97.5 F (36.4 C) (Oral)   Resp 18   SpO2 98%   Visual Acuity Right Eye Distance:   Left Eye Distance:   Bilateral Distance:    Right Eye Near:   Left Eye Near:    Bilateral Near:     Physical Exam Vitals reviewed.  Constitutional:      General: He is not in acute distress.    Appearance: Normal appearance. He is not ill-appearing.  HENT:     Head: Normocephalic and atraumatic.     Right  Ear: Tympanic membrane, ear canal and external ear normal. No tenderness. No middle ear effusion. There is no impacted cerumen. Tympanic membrane is not perforated, erythematous, retracted or bulging.     Left Ear: Tympanic membrane, ear canal and external ear normal. No tenderness.  No middle ear effusion. There is no impacted cerumen. Tympanic membrane is not perforated, erythematous, retracted or bulging.     Nose: Nose normal. No congestion.     Mouth/Throat:     Mouth: Mucous membranes are moist.     Pharynx: Uvula midline. No oropharyngeal exudate or posterior oropharyngeal erythema.     Tonsils: No tonsillar exudate.  Eyes:     Extraocular Movements: Extraocular movements intact.     Pupils: Pupils are equal, round, and reactive to light.  Cardiovascular:     Rate and Rhythm: Normal rate and regular rhythm.     Heart sounds: Normal heart sounds.  Pulmonary:     Effort: Pulmonary effort is normal.     Breath sounds: Wheezing present. No decreased breath sounds, rhonchi or rales.     Comments: On initial exam, wheezing throughout. Following DuoNeb treatment, improvement in wheezing, though it is still present.  Abdominal:     Palpations: Abdomen is soft.     Tenderness: There is no abdominal tenderness. There is no guarding or rebound.  Lymphadenopathy:     Cervical: No cervical adenopathy.     Right cervical: No superficial, deep or posterior cervical adenopathy.    Left cervical: No superficial, deep or posterior cervical adenopathy.  Skin:    Comments: No rash   Neurological:     General: No focal deficit present.     Mental Status: He is alert and oriented  to person, place, and time.  Psychiatric:        Mood and Affect: Mood normal.        Behavior: Behavior normal.        Thought Content: Thought content normal.        Judgment: Judgment normal.      UC Treatments / Results  Labs (all labs ordered are listed, but only abnormal results are displayed) Labs Reviewed -  No data to display  EKG   Radiology No results found.  Procedures Procedures (including critical care time)  Medications Ordered in UC Medications  ipratropium-albuterol  (DUONEB) 0.5-2.5 (3) MG/3ML nebulizer solution 3 mL (3 mLs Nebulization Given 08/27/24 1116)    Initial Impression / Assessment and Plan / UC Course  I have reviewed the triage vital signs and the nursing notes.  Pertinent labs & imaging results that were available during my care of the patient were reviewed by me and considered in my medical decision making (see chart for details).     Patient is a pleasant 78 year old male presenting with acute exacerbation of reactive airway disease, due to acute viral syndrome. The patient is afebrile and nontachycardic.  Antipyretic has not been administered today.  On initial exam, there is expiratory wheezing throughout.  Following DuoNeb treatment, improvement in wheezing, though it is still present. Former smoker. He is not a diabetic.   Will manage with augmentin , low-dose prednisone , and I refilled his albuterol  inhaler.  Considering his pulmonary history of former smoker and reactive airway disease, I have concern for bacterial infection.  We do not have x-ray onsite today, so I did not perform an x-ray; x-ray imaging would not have changed my management today.  Strict return precautions as below.  Level 4 for acute exacerbation of chronic condition and prescription drug management.  Final Clinical Impressions(s) / UC Diagnoses   Final diagnoses:  Moderate persistent reactive airway disease with acute exacerbation     Discharge Instructions      -Albuterol  inhaler as needed for cough, wheezing, shortness of breath, 1 to 2 puffs every 6 hours as needed. -Continue daily advair -Prednisone , 2 pills taken at the same time for 5 days in a row.  Try taking this earlier in the day as it can give you energy. Avoid NSAIDs like ibuprofen and alleve while taking this  medication as they can increase your risk of stomach upset and even GI bleeding when in combination with a steroid. You can continue tylenol  (acetaminophen ) up to 1000mg  3x daily. -Start the antibiotic-Augmentin  (amoxicillin -clavulanate), 1 pill every 12 hours for 7 days.  You can take this with food like with breakfast and dinner. -Your cough should slowly get better instead of worse. If you develop a cough productive of dark or red sputum, new shortness of breath, new chest tightness, new fevers, etc - seek additional care.      ED Prescriptions     Medication Sig Dispense Auth. Provider   albuterol  (VENTOLIN  HFA) 108 (90 Base) MCG/ACT inhaler Inhale 2 puffs into the lungs every 4 (four) hours as needed for wheezing or shortness of breath. 1 each Arlyss Leita BRAVO, PA-C   predniSONE  (DELTASONE ) 10 MG tablet Take 2 tablets (20 mg total) by mouth daily for 5 days. 10 tablet Diamonds Lippard E, PA-C   amoxicillin -clavulanate (AUGMENTIN ) 875-125 MG tablet Take 1 tablet by mouth every 12 (twelve) hours. 14 tablet Amillia Biffle E, PA-C      PDMP not reviewed this encounter.   Arlyss,  Leita BRAVO, PA-C 08/27/24 1151

## 2024-08-27 NOTE — ED Triage Notes (Signed)
 Used expire Albuterol  this morning and his Advair.

## 2024-08-27 NOTE — Significant Event (Signed)
 Rapid Response Event Note   Reason for Call :  Wide-complex tachycardia, SOB  Initial Focused Assessment:  Pt sitting up in bed with eyes open. He is alert and oriented, c/o SOB. His breathing is tachypenic and labored. Lungs with exp wheezes. Skin warm/dry.   T-99.3, HR-130s, BP-146/86, RR-30s, SpO2-100% on NRB  Interventions:  EKG-AF RVR Metoprolol 5mg  prn dose IV Xopenex tx Bipap Plan of Care:  HR better after metoprolol. Give Xopenex and place pt on bipap. Continue to monitor closely. Please call RRT if further assistance needed.   Event Summary:   MD Notified: Dr. Marcene  Call Time:2320 Arrival [phone] End Time:  Tish Graeme Piety, RN

## 2024-08-27 NOTE — Progress Notes (Signed)
 BIPAP ordered for patient. Went to assess the patient and he does not appear to be very SOB, able to talk in complete sentences. RN admin lasix  now. Messaged Pfeiffer,MD and ask if we could hold off on BIPAP for now. She is okay with that.

## 2024-08-27 NOTE — H&P (Signed)
 History and Physical      [name] [name] FMW:[id number] DOB: 10/02/1945 DOA: 08/27/2024; DOS: 08/27/2024  PCP: [name] Arlyss RAMAN, MD  Patient coming from: home   I have personally briefly reviewed patient's old medical records in [facility]  Chief Complaint: Shortness of breath  HPI: [name] [name] is a 78 y.o. male with medical history significant for chronic biventricular systolic heart failure, paroxysmal atrial fibrillation chronically anticoagulated on Eliquis , essential hypertension, acquired hypothyroidism, who is admitted to [facility] on 08/27/2024 with acute on chronic systolic heart failure after presenting from home to [facility] ED complaining of shortness of breath.   Patient reports 2 to 3 days of progressive shortness of breath associated with new nonproductive cough, worsening of peripheral edema.  Denies any associated chest pain, palpitations, diaphoresis, nausea, vomiting.  Denies any associated mopped assist nor any recent subjective fever, chills, rigors, or generalized myalgias.  No wheezing.  He had presented to urgent care earlier today with the above complaints, where there was suspicion for potential underlying pneumonia.  The patient was prescribed Augmentin , prn albuterol  inhaler as well as a 5-day course of prednisone .  However, the absence of no significant improvement in the above symptoms, he subsequently contacted EMS who noted his initial oxygen saturation to be 87% on room air.  He was brought to the [facility] emergency department via EMS for further evaluation and management thereof, and received a nebulizer treatment as well as IV magnesium and route to the ED.  Has a documented history of chronic biventricular systolic heart failure, with most recent echocardiogram in February 2025, which showed LVEF 25 to 30%, with moderate to severely dilated left ventricular cavity, indeterminate diastolic parameters, moderately reduced right ventricular systolic  function, severely dilated bilateral atria, moderate to severe mitral digitation, moderate tricuspid regurgitation, and mild to moderate aortic regurgitation.  He notes good compliance with his outpatient Lasix  40 mg p.o. daily as well as his additional cardiac medications that include Entresto , apical flows and, spironolactone , and Coreg .  He also has a history of paroxysmal atrial fibrillation for which he reports good compliance with his outpatient Eliquis .  In addition to the above cardiac medications, he is also on digoxin  at home.  Denies any known chronic underlying primary pulmonary pathology, including no known history of asthma or COPD.  He smoked for approximately 6 months in the 1960s, before completely quitting smoking without any ensuing resumption thereof.   Per chart review, baseline creatinine ranges 1.2-1.4.    ED Course:  Vital signs in the ED were notable for the following: Afebrile; in atrial fibrillation his initial heart rates were noted to be in the 160s, which is slightly improved into the range of the 90s to 120s following initiation of IV diuresis efforts; systolic blood pressures been in the 120s to 150s; respiratory rate 15-27; 93 to 100% on room air.   Labs were notable for the following: CMP was notable for the following: Sodium 134, which corrects to approximately 135.5 when taking into account mild hyperglycemia, which is relative to most recent prior sodium level of 138 on 08/16/2024, potassium 5.5, bicarbonate 19, anion gap 17, creatinine 1.49 compared to 1.35 on 08/16/2024, glucose 189.  Magnesium level 2.9.  BNP 1633, without any prior BNP data points available for point comparison.  Relative to proBNP value of 385 in February 2023.  High sensitive troponin initially 11, with repeat value noted to be 15.  CBC notable for white cell count 8200, hemoglobin 15.9.  Lactic acid 2, with repeat unchanged.  Urinalysis showed no evidence of white blood cells, was leukocyte  esterase/nitrate negative, and showed no bacteria.  UA also notable for trace hemoglobin in the absence of any RBCs and showed 15 ketones.  COVID, influenza, RSV PCR were all negative.  Per my interpretation, EKG in ED in comparison to most recent prior EKG from 08/16/2024, demonstrated the following: Atrial fibrillation with heart rate show 96, nonspecific intraventricular conduction delay, nonspecific T wave inversion in aVL, appears unchanged from most recent prior EKG, also showing nonspecific less than 1 mm ST elevation in leads III and aVF, without evidence of STEMI, and otherwise no evidence of ST changes.  Imaging in the ED, per corresponding formal radiology read, was notable for the following: 1 view chest x-ray showed cardiomegaly with increased pulmonary vascular congestion in the absence of infiltrate, pleural effusion, or pneumothorax.  While in the ED, the following were administered: Lasix  60 mg IV x 1 dose, Xopenex nebulizer x 1, Lopressor 5 mg IV x 1 dose.  Subsequently, the patient was admitted for further evaluation management of presenting acute on chronic systolic heart failure along with atrial fibrillation with RVR, and presenting labs notable for mild hyperkalemia as well as lactic acidosis.     Review of Systems: As per HPI otherwise 10 point review of systems negative.   Past Medical History:  Diagnosis Date   Atrial fibrillation (HCC)    CHF (congestive heart failure) (HCC)    Cirrhosis (HCC)    ED (erectile dysfunction)    Hyperglycemia    Hyperlipidemia    Hypertension    Internal hemorrhoids    Myocardial infarction (HCC) 2008   SCCA (squamous cell carcinoma) of skin    Left ear.  MOHS surgery 2014 [facility]   Skin cancer of face    2012 basal cell   Thyroid  disease     Past Surgical History:  Procedure Laterality Date   A-Fib Cardiomyopathy [facility])  10/2006   EF 25 - 35%   DOPPLER ECHOCARDIOGRAPHY  10/31/2006   EF 25-35% Mod. M.R., mild -mod  TR   RIGHT/LEFT HEART CATH AND CORONARY ANGIOGRAPHY Bilateral 08/31/2023   Procedure: RIGHT/LEFT HEART CATH AND CORONARY ANGIOGRAPHY;  Surgeon: Mady Bruckner, MD;  Location: [facility] INVASIVE CV LAB;  Service: Cardiovascular;  Laterality: Bilateral;    Social History:  reports that he quit smoking about 58 years ago. His smoking use included cigarettes. He has a 0.3 pack-year smoking history. He has never used smokeless tobacco. He reports that he does not currently use alcohol. He reports that he does not use drugs.   No Known Allergies  Family History  Problem Relation Age of Onset   Hypertension Mother    Cancer Mother        Bilat. breast dz, s/p mastectomy, Bilat   Colon cancer Mother        possible   Colon polyps Mother    Heart disease Father        CHF, S/P CABG (71) Pacer Incr BP   Hyperlipidemia Father    Heart disease Brother 40       CAD- stent and ICD/pacer   Prostate cancer Neg Hx     Family history reviewed and not pertinent    Prior to Admission medications   Medication Sig Start Date End Date Taking? Authorizing Provider  albuterol  (VENTOLIN  HFA) 108 (90 Base) MCG/ACT inhaler Inhale 2 puffs into the lungs every 4 (four) hours as needed for  wheezing or shortness of breath. 08/27/24   Graham, Laura E, PA-C  amoxicillin -clavulanate (AUGMENTIN ) 875-125 MG tablet Take 1 tablet by mouth every 12 (twelve) hours. 08/27/24   Graham, Laura E, PA-C  apixaban  (ELIQUIS ) 5 MG TABS tablet Take 1 tablet by mouth twice daily 08/16/24   End, Lonni, MD  carvedilol  (COREG ) 25 MG tablet TAKE 1 TABLET BY MOUTH TWICE DAILY WITH A MEAL 06/12/24   End, Lonni, MD  digoxin  (LANOXIN ) 0.125 MG tablet Take 0.125 mg by mouth daily.    [provider]  doxazosin  (CARDURA ) 4 MG tablet Take 1 tablet (4 mg total) by mouth at bedtime. 01/06/24   [name] Arlyss RAMAN, MD  empagliflozin  (JARDIANCE ) 10 MG TABS tablet Take 1 tablet (10 mg total) by mouth daily before breakfast. 10/04/23    End, Lonni, MD  ENTRESTO  24-26 MG Take 1 tablet by mouth 2 (two) times daily. 12/12/18   [name] Arlyss RAMAN, MD  fish oil-omega-3 fatty acids 1000 MG capsule Take 1 g by mouth daily.    [provider]  fluticasone -salmeterol (ADVAIR) 250-50 MCG/ACT AEPB INHALE 1 DOSE BY MOUTH IN THE MORNING AND AT BEDTIME RINSE MOUTH AFTER USE 08/20/24   [name] Arlyss RAMAN, MD  furosemide  (LASIX ) 20 MG tablet Take 1-2 tablets (20-40 mg total) by mouth daily. 01/06/24   [name] Arlyss RAMAN, MD  levothyroxine  (SYNTHROID ) 137 MCG tablet Take 1 tablet (137 mcg total) by mouth daily before breakfast. 01/06/24   [name] Arlyss RAMAN, MD  Melatonin 5 MG CAPS Take 5 mg by mouth daily.  12/18/14   [provider]  Multiple Vitamin (MULTIVITAMIN) tablet Take 1 tablet by mouth daily.    [provider]  pravastatin  (PRAVACHOL ) 40 MG tablet Take 1 tablet (40 mg total) by mouth daily. 01/06/24   [name] Arlyss RAMAN, MD  predniSONE  (DELTASONE ) 10 MG tablet Take 2 tablets (20 mg total) by mouth daily for 5 days. 08/27/24 09/01/24  Graham, Laura E, PA-C  sildenafil  (VIAGRA ) 100 MG tablet Take 1 tablet (100 mg total) by mouth daily as needed for erectile dysfunction. 03/01/24   [name] Arlyss RAMAN, MD  spironolactone  (ALDACTONE ) 25 MG tablet Take 1 tablet (25 mg total) by mouth daily. 01/06/24   [name] Arlyss RAMAN, MD     Objective    Physical Exam: Vitals:   08/27/24 2015 08/27/24 2030 08/27/24 2045 08/27/24 2115  BP: (!) 124/91 (!) 135/55 (!) 152/107 129/85  Pulse: (!) 101 100 (!) 106 85  Resp: 13 15 (!) 30 18  Temp:      SpO2: 99% 100% 100% 92%  Weight:      Height:        General: appears to be stated age; alert, oriented Skin: warm, dry, no rash Head:  AT/[place] Mouth:  Oral mucosa membranes appear moist, normal dentition Neck: supple; trachea midline Heart:  irregular, mildly tachycardic; did not appreciate any 2/6 holosystolic murmur noted. Lungs: CTAB, did not appreciate any wheezes, rales, or  rhonchi Abdomen: + BS; soft, ND, NT Vascular: 2+ pedal pulses b/l; 2+ radial pulses b/l Extremities: Trace edema in the bilateral lower extremities, no muscle wasting         Labs on Admission: I have personally reviewed following labs and imaging studies  CBC: Recent Labs  Lab 08/27/24 1907  WBC 8.2  NEUTROABS 7.2  HGB 15.9  HCT 47.4  MCV 99.2  PLT 121*   Basic Metabolic Panel: Recent Labs  Lab 08/27/24 1907  NA 134*  K  5.5*  CL 98  CO2 19*  GLUCOSE 189*  BUN 16  CREATININE 1.49*  CALCIUM 9.2  MG 2.9*   GFR: Estimated Creatinine Clearance: 42.2 mL/min (A) (by C-G formula based on SCr of 1.49 mg/dL (H)). Liver Function Tests: No results for input(s): AST, ALT, ALKPHOS, BILITOT, PROT, ALBUMIN in the last 168 hours. No results for input(s): LIPASE, AMYLASE in the last 168 hours. No results for input(s): AMMONIA in the last 168 hours. Coagulation Profile: No results for input(s): INR, PROTIME in the last 168 hours. Cardiac Enzymes: No results for input(s): CKTOTAL, CKMB, CKMBINDEX, TROPONINI in the last 168 hours. BNP (last 3 results) No results for input(s): PROBNP in the last 8760 hours. HbA1C: No results for input(s): HGBA1C in the last 72 hours. CBG: No results for input(s): GLUCAP in the last 168 hours. Lipid Profile: No results for input(s): CHOL, HDL, LDLCALC, TRIG, CHOLHDL, LDLDIRECT in the last 72 hours. Thyroid  Function Tests: No results for input(s): TSH, T4TOTAL, FREET4, T3FREE, THYROIDAB in the last 72 hours. Anemia Panel: No results for input(s): VITAMINB12, FOLATE, FERRITIN, TIBC, IRON, RETICCTPCT in the last 72 hours. Urine analysis:    Component Value Date/Time   COLORURINE YELLOW 08/27/2024 2048   APPEARANCEUR CLEAR 08/27/2024 2048   LABSPEC 1.010 08/27/2024 2048   PHURINE 5.5 08/27/2024 2048   GLUCOSEU >=500 (A) 08/27/2024 2048   HGBUR TRACE (A) 08/27/2024 2048    BILIRUBINUR NEGATIVE 08/27/2024 2048   KETONESUR 15 (A) 08/27/2024 2048   PROTEINUR NEGATIVE 08/27/2024 2048   NITRITE NEGATIVE 08/27/2024 2048   LEUKOCYTESUR NEGATIVE 08/27/2024 2048    Radiological Exams on Admission: DG Chest Port 1 View Result Date: 08/27/2024 EXAM: 1 VIEW(S) XRAY OF THE CHEST 08/27/2024 07:37:00 PM COMPARISON: 12/22/2021 CLINICAL HISTORY: sob FINDINGS: LUNGS AND PLEURA: Pulmonary vascular congestion. No focal consolidation. No pleural effusion. No pneumothorax. HEART AND MEDIASTINUM: Cardiomegaly. Atherosclerotic calcifications of the aorta. BONES AND SOFT TISSUES: Surgical clips in right supraclavicular region. No acute osseous abnormality. IMPRESSION: 1. Cardiomegaly and pulmonary vascular congestion. Electronically signed by: Norman Gatlin MD 08/27/2024 07:52 PM EST RP Workstation: [id number]      Assessment/Plan   Principal Problem:   Acute on chronic systolic heart failure (HCC) Active Problems:   Acquired hypothyroidism   SOB (shortness of breath)   Paroxysmal atrial fibrillation with RVR (HCC)   Hyperkalemia   Lactic acidosis   High anion gap metabolic acidosis   HLD (hyperlipidemia)       #) Acute on chronic systolic heart failure: dx of acute decompensation on the basis of presenting 2 to 3 days of progressive shortness of breath associate with new onset cough, worsening of edema in the bilateral lower extremities, interval increase in BNP as well as chest x-ray showing evidence of cardiomegaly with increase in pulmonary vascular congestion. This is in the context of a known history of chronic biventricular systolic heart failure, with most recent echocardiogram performed in February 2025, which showed LVEF 25 to 30%, with moderate to severely dilated left ventricular cavity size, indeterminate diastolic parameters as well as moderate reduction in right ventricular systolic function. Etiology leading to presenting acutely decompensated heart failure is  unclear at this time.  He presents with concomitant atrial fibrillation with RVR, and it is unclear if the atrial fibrillation with RVR was a primary contributor to his acutely decompensated heart failure was present as a consequence of his underlying acutely decompensated heart failure.  He is at increased risk for both acute decompensation of his heart  failure as well as atrial fibrillation with RVR in the setting of his multiple valvular pathologies, including moderate to severe mitral regurgitation.  Of note, the patient refused initial recommendation from EDP for BiPAP.  He has shown no evidence of objective hypoxia throughout his ED course.  Patient conveys good compliance with home diuretic therapy, which consists of Lasix  40 mg p.o. daily, as well as good compliance with their additional home cardiac medications, including his goal-directed therapy, notable for Coreg , Entresto , spironolactone , and empagliflozin .  Overall, ACS leading to presenting acutely decompensated heart failure appears less likely at this time in the absence of any recent CP, presenting EKG showing no evidence of acute ischemic changes, as well as non-elevated troponin in spite of 2 to 3 days of the above symptoms.   Chest x-ray shows no evidence of infiltrate to suggest pneumonia, and or any evidence of pneumothorax.  COVID, influenza, and RSV PCR are all negative.  Clinically, acute pulmonary embolism appears less likely, particular given his good compliance with chronic anticoagulation on Eliquis .  Of note, patient received Lasix  60 mg IV while in the ED today. Presentation warrants additional IV diuresis, as further detailed below, with close monitoring of ensuing renal function, electrolytes, and volume status, as further noted below.  His heart rate is improving with IV diuresis, potentially suggestive of acutely decompensated heart failure leading to his atrial fibrillation with RVR.  Particular given the biventricular  nature of the patient's heart failure, will closely monitor ensuing volume status and monitor for any ensuing development of hypotension.    Plan: monitor strict I's & O's and daily weights. Monitor on telemetry, including trend in HR in response to diuresis, as above. Repeat CMP in the morning, including for monitoring trend of potassium, bicarbonate, and renal function in response to interval diuresis efforts. Mag level in the AM. Lasix  40 mg IV twice daily.  Resume home Coreg .  In the setting of presenting hyperkalemia, will hold next doses of Entresto , and other fluids and and spironolactone .  Echocardiogram in the morning.  Add on procalcitonin level.  Check TSH.  Repeat lactic acid level in the morning.  Repeat BMP in the morning.                    #) Atrial fibrillation with RVR.  In the context of documented history of paroxysmal atrial fibrillation for which she is chronically anticoagulated on Eliquis , the patient was noted to be in atrial fibrillation with RVR upon presenting to the emergency department this evening, with initial heart rates reported to be in the 160s to 170s.  His heart rate is improving with IV diuresis, and suspect exacerbation of his atrial fibrillation with RVR as a consequence of acute volume overload stemming from his presenting acute on chronic systolic heart failure.  His blood pressure has been tolerating the initial RVR as well as diuresis efforts, without any objective hypotension noted.  Will continue IV diuresis efforts and closely monitor and seeing heart rate, as below.  His AV nodal blocking agents as an outpatient include Coreg  as well as digoxin .  No other overt contributing factors leading to his presenting atrial fibrillation with RVR, will noting that he is predisposed to going into atrial fibrillation in the setting of multiple mechanical factors that include most recent echocardiogram showing severe mitral regurgitation as well as severely  dilated bilateral atria.   Plan: Further evaluation management of presenting acute on chronic systolic heart failure, including additional IV diuresis, as  above.  Monitor strict I's and O's and daily weights.  Repeat BMP in the morning.  Follow-up result of updated echocardiogram ordered for the morning.  Add on digoxin  level.  Resume home Coreg , digoxin .  Repeat CMP and magnesium levels in the morning.  Monitor on telemetry.  As needed IV Lopressor for sustained heart rates greater than 130.  Check TSH, procalcitonin level.  Hold next dose of doxazosin  to reduce risk for associated reflex tachycardia.                   #) Hyperkalemia: Presenting potassium level mildly elevated at 5.5, without report of any associated hemolysis.  Suspect that this is multifactorial in etiology, including potential contribution from multiple pharmacologic factors, including outpatient Entresto , spironolactone , as well as empagliflozin .  Additionally, in the setting of his presenting anion gap metabolic acidosis, suspect a contribution from extracellular shifting potassium.  No associated EKG changes.  He has received IV Lasix , Xopenex nebulizer.   Plan: Continue IV Lasix , as above.  Lokelma 10 g p.o. x 1 dose now.  Repeat BMP around 1 AM on 08/27/2024.  CMP in the morning.  Repeat serum magnesium level in the morning.  Repeat lactic acid level in the morning.  Further evaluation management of acute on chronic systolic heart failure as above, as well as further evaluation management of anion gap metabolic acidosis, as below.  Hold home empagliflozin  for now.                     #) anion gap metabolic acidosis with lactic acidosis: CMP reflects bicarbonate 19 and anion gap 17 in the setting of mild lactic acidosis, with initial lactate 2.0, with repeat lactate unchanged.  Suspect contribution from presenting acute on chronic systolic heart failure, as well as contribution from relative  decline in generalized perfusion as a consequence of relative reduction in cardiac output resulting from loss of atrial kick associated with the patient's atrial fibrillation as well as diminished cardiac output as a result of relative decline in stroke-volume as a result of diminished diastolic filling time in the setting of atrial fibrillation with RVR.  No evidence of contributory underlying infectious process at this time, including chest x-ray that shows no evidence of infiltrate to suggest pneumonia, will urinalysis is in consistent with urinary tract infection.  Potential additional pharmacologic contribution from outpatient Emblica flows noted.  There may also be an element of starvation ketosis, as the patient notes slight decline in oral intake over the last few days during which time he has not been feeling well, We will noting that urinalysis shows the presence of 15 ketones, in the absence of any additional laboratory evidence to suggest DKA.  Will refrain from IV fluids, but rather pursue additional evaluation management of underlying/presenting acute on chronic systolic heart failure complicated by atrial fibrillation with RVR, as above, as well as pursuit of additional diagnostic evaluation, as outlined below.  Plan: Hold home glipizide for now.  Further evaluation management of acute on chronic systolic heart failure as well as atrial fibrillation with RVR, as above.  Check procalcitonin level.  Check synthetic hepatic function with PTT, INR, will noting that the patient is chronically anticoagulated on Eliquis .  Lactic acid in the morning.  Add on serum ethanol level.  In the setting of urinalysis showing trace hemoglobin, no RBCs, will also check CPK level.                        #)  Hyperlipidemia: documented h/o such. On pravastatin  as outpatient.   Plan: continue home statin.  Follow-up for result of CPK level.                        #)  acquired hypothyroidism: documented h/o such, on Synthroid  as outpatient.   Plan: cont home Synthroid .  In the setting of presenting atrial fibrillation with RVR, will check TSH level.     DVT prophylaxis: SCD's + continuation of outpatient Eliquis  Code Status: Full code Family Communication: none Disposition Plan: Per Rounding Team Consults called: none;  Admission status: Observation     I SPENT GREATER THAN 75  MINUTES IN CLINICAL CARE TIME/MEDICAL DECISION-MAKING IN COMPLETING THIS ADMISSION.      Eva NOVAK Mckay Tegtmeyer DO [facility]  From 7PM - 7AM   08/27/2024, 9:41 PM

## 2024-08-27 NOTE — ED Triage Notes (Signed)
 Pt to the ed from home with a CC sob with chest pressure. Pt woke up this morning at 230 am not feeling well. EMS found pt with room air spo2 87% and a HR of 160. EMS gave 2 grams of mag, and 2 duo nebs. Pt relays some sob, without dizziness.

## 2024-08-27 NOTE — Discharge Instructions (Addendum)
-  Albuterol  inhaler as needed for cough, wheezing, shortness of breath, 1 to 2 puffs every 6 hours as needed. -Continue daily advair -Prednisone , 2 pills taken at the same time for 5 days in a row.  Try taking this earlier in the day as it can give you energy. Avoid NSAIDs like ibuprofen and alleve while taking this medication as they can increase your risk of stomach upset and even GI bleeding when in combination with a steroid. You can continue tylenol  (acetaminophen ) up to 1000mg  3x daily. -Start the antibiotic-Augmentin  (amoxicillin -clavulanate), 1 pill every 12 hours for 7 days.  You can take this with food like with breakfast and dinner. -Your cough should slowly get better instead of worse. If you develop a cough productive of dark or red sputum, new shortness of breath, new chest tightness, new fevers, etc - seek additional care.

## 2024-08-27 NOTE — ED Provider Notes (Signed)
 [place] EMERGENCY DEPARTMENT AT [facility] Provider Note   CSN: [id number] Arrival date & time: 08/27/24  1857     Patient presents with: Shortness of Breath   [name] [name] is a 78 y.o. male.   HPI Patient reports has had a cough for few days and he thought that he was getting a virus or pneumonia.  He reports that the symptoms have been getting worse and has had increasing shortness of breath and awaken 230 this morning with difficulty breathing and some chest pressure.  Patient went to urgent care and got seen and prescribed albuterol , prednisone  and Augmentin  for reactive airway disease.  Patient reports that he got home today but he started feeling a lot worse with significantly increasing shortness of breath and called EMS.  On EMS arrival patient had oxygen saturation 87% on room air and heart rate of 160.  He was given 2 DuoNebs and 2 g of magnesium.  He did get some improvement of shortness of breath and the EMS reported improvement in heart rate.  Patient is alert and giving history upon arrival.    Prior to Admission medications   Medication Sig Start Date End Date Taking? Authorizing Provider  albuterol  (VENTOLIN  HFA) 108 (90 Base) MCG/ACT inhaler Inhale 2 puffs into the lungs every 4 (four) hours as needed for wheezing or shortness of breath. 08/27/24   Graham, Laura E, PA-C  amoxicillin -clavulanate (AUGMENTIN ) 875-125 MG tablet Take 1 tablet by mouth every 12 (twelve) hours. 08/27/24   Graham, Laura E, PA-C  apixaban  (ELIQUIS ) 5 MG TABS tablet Take 1 tablet by mouth twice daily 08/16/24   End, Lonni, MD  carvedilol  (COREG ) 25 MG tablet TAKE 1 TABLET BY MOUTH TWICE DAILY WITH A MEAL 06/12/24   End, Lonni, MD  digoxin  (LANOXIN ) 0.125 MG tablet Take 0.125 mg by mouth daily.    [provider]  doxazosin  (CARDURA ) 4 MG tablet Take 1 tablet (4 mg total) by mouth at bedtime. 01/06/24   Cleatus Arlyss RAMAN, MD  empagliflozin  (JARDIANCE ) 10 MG TABS tablet  Take 1 tablet (10 mg total) by mouth daily before breakfast. 10/04/23   End, Lonni, MD  ENTRESTO  24-26 MG Take 1 tablet by mouth 2 (two) times daily. 12/12/18   Cleatus Arlyss RAMAN, MD  fish oil-omega-3 fatty acids 1000 MG capsule Take 1 g by mouth daily.    [provider]  fluticasone -salmeterol (ADVAIR) 250-50 MCG/ACT AEPB INHALE 1 DOSE BY MOUTH IN THE MORNING AND AT BEDTIME RINSE MOUTH AFTER USE 08/20/24   Cleatus Arlyss RAMAN, MD  furosemide  (LASIX ) 20 MG tablet Take 1-2 tablets (20-40 mg total) by mouth daily. 01/06/24   Cleatus Arlyss RAMAN, MD  levothyroxine  (SYNTHROID ) 137 MCG tablet Take 1 tablet (137 mcg total) by mouth daily before breakfast. 01/06/24   Cleatus Arlyss RAMAN, MD  Melatonin 5 MG CAPS Take 5 mg by mouth daily.  12/18/14   [provider]  Multiple Vitamin (MULTIVITAMIN) tablet Take 1 tablet by mouth daily.    [provider]  pravastatin  (PRAVACHOL ) 40 MG tablet Take 1 tablet (40 mg total) by mouth daily. 01/06/24   Cleatus Arlyss RAMAN, MD  predniSONE  (DELTASONE ) 10 MG tablet Take 2 tablets (20 mg total) by mouth daily for 5 days. 08/27/24 09/01/24  Graham, Laura E, PA-C  sildenafil  (VIAGRA ) 100 MG tablet Take 1 tablet (100 mg total) by mouth daily as needed for erectile dysfunction. 03/01/24   Cleatus Arlyss RAMAN, MD  spironolactone  (ALDACTONE ) 25  MG tablet Take 1 tablet (25 mg total) by mouth daily. 01/06/24   Cleatus Arlyss RAMAN, MD    Allergies: Patient has no known allergies.    Review of Systems  Updated Vital Signs BP (!) 124/91   Pulse (!) 101   Temp 98.6 F (37 C)   Resp 13   Ht 5' 10 (1.778 m)   Wt 77 kg   SpO2 99%   BMI 24.36 kg/m   Physical Exam Constitutional:      Comments: Mild to moderate increased work of breathing at rest.  Speaking in full sentences.  Mental status clear.  HENT:     Head: Normocephalic and atraumatic.     Mouth/Throat:     Pharynx: Oropharynx is clear.  Eyes:     Extraocular Movements: Extraocular movements intact.   Cardiovascular:     Rate and Rhythm: Tachycardia present. Rhythm irregular.  Pulmonary:     Comments: Mild to moderate increased work of breathing.  Patient has some coarse wheeze or crackle more concentrated at the right base. Abdominal:     General: There is no distension.     Palpations: Abdomen is soft.     Tenderness: There is no abdominal tenderness. There is no guarding.  Musculoskeletal:     Comments: 1+ edema.  Calf soft nontender.  Skin:    General: Skin is warm and dry.  Neurological:     General: No focal deficit present.     Mental Status: He is oriented to person, place, and time.     Motor: No weakness.     Coordination: Coordination normal.  Psychiatric:        Mood and Affect: Mood normal.     (all labs ordered are listed, but only abnormal results are displayed) Labs Reviewed  BRAIN NATRIURETIC PEPTIDE - Abnormal; Notable for the following components:      Result Value   B Natriuretic Peptide 1,633.9 (*)    All other components within normal limits  BASIC METABOLIC PANEL WITH GFR - Abnormal; Notable for the following components:   Sodium 134 (*)    Potassium 5.5 (*)    CO2 19 (*)    Glucose, Bld 189 (*)    Creatinine, Ser 1.49 (*)    GFR, Estimated 48 (*)    Anion gap 17 (*)    All other components within normal limits  CBC WITH DIFFERENTIAL/PLATELET - Abnormal; Notable for the following components:   Platelets 121 (*)    All other components within normal limits  URINALYSIS, ROUTINE W REFLEX MICROSCOPIC - Abnormal; Notable for the following components:   Glucose, UA >=500 (*)    Hgb urine dipstick TRACE (*)    Ketones, ur 15 (*)    All other components within normal limits  MAGNESIUM - Abnormal; Notable for the following components:   Magnesium 2.9 (*)    All other components within normal limits  I-STAT CG4 LACTIC ACID, ED - Abnormal; Notable for the following components:   Lactic Acid, Venous 2.0 (*)    All other components within normal limits   I-STAT CG4 LACTIC ACID, ED - Abnormal; Notable for the following components:   Lactic Acid, Venous 2.0 (*)    All other components within normal limits  RESP PANEL BY RT-PCR (RSV, FLU A&B, COVID)  [id number]  CULTURE, BLOOD (ROUTINE X 2)  CULTURE, BLOOD (ROUTINE X 2)  URINALYSIS, MICROSCOPIC (REFLEX)  TROPONIN I (HIGH SENSITIVITY)  TROPONIN I (HIGH SENSITIVITY)    EKG:  None EKG atrial fibrillation 96 intraventricular conduction delay.  Rate increased but no significant change from previous.  Radiology: [facility] Chest Port 1 View Result Date: 08/27/2024 EXAM: 1 VIEW(S) XRAY OF THE CHEST 08/27/2024 07:37:00 PM COMPARISON: 12/22/2021 CLINICAL HISTORY: sob FINDINGS: LUNGS AND PLEURA: Pulmonary vascular congestion. No focal consolidation. No pleural effusion. No pneumothorax. HEART AND MEDIASTINUM: Cardiomegaly. Atherosclerotic calcifications of the aorta. BONES AND SOFT TISSUES: Surgical clips in right supraclavicular region. No acute osseous abnormality. IMPRESSION: 1. Cardiomegaly and pulmonary vascular congestion. Electronically signed by: Norman Gatlin MD 08/27/2024 07:52 PM EST RP Workstation: [id number]     Procedures  CRITICAL CARE Performed by: Ludivina Shines   Total critical care time: 30 minutes  Critical care time was exclusive of separately billable procedures and treating other patients.  Critical care was necessary to treat or prevent imminent or life-threatening deterioration.  Critical care was time spent personally by me on the following activities: development of treatment plan with patient and/or surrogate as well as nursing, discussions with consultants, evaluation of patient's response to treatment, examination of patient, obtaining history from patient or surrogate, ordering and performing treatments and interventions, ordering and review of laboratory studies, ordering and review of radiographic studies, pulse oximetry and re-evaluation of patient's condition.   Medications Ordered in the ED  levalbuterol [facility]) nebulizer solution 0.63 mg (0.63 mg Nebulization Given 08/27/24 1946)  metoprolol tartrate (LOPRESSOR) injection 5 mg (5 mg Intravenous Given 08/27/24 1943)  furosemide  (LASIX ) injection 60 mg (60 mg Intravenous Given 08/27/24 2102)                                    Medical Decision Making Amount and/or Complexity of Data Reviewed Labs: ordered. Radiology: ordered.  Risk Prescription drug management. Decision regarding hospitalization.  Patient presents as outlined.  On examination and his arrival he has rapid atrial fibrillation.  Patient does have chronic atrial fibrillation but rate is uncontrolled.  Patient is short of breath but speaking in full sentences.  Differential diagnosis includes pneumonia\COPD\CHF\ACS.  Will proceed with broad diagnostic evaluation.  Patient was given 2 DuoNeb therapy with some improvement.  Will administer a Xopenex as patient's heart rate is significantly increased.  Patient has known history of atrial fibrillation.  Rates are significantly elevated, blood pressure is normotensive to hypertensive will administer a dose of Lopressor 5 mg IV.  Urinalysis negative.  Respiratory panel negative for COVID or influenza BNP 1633.  Troponin 11.  GFR 48.  Anion gap 17.  Potassium 5.5.  CBC normal except platelets of 121.  Normal differential.  Chest x-ray interpreted radiology and also visually reviewed by myself positive for vascular congestion and cardiomegaly.  EMR review shows patient had an echocardiogram 2\18\2025.  Patient has EF of estimated 25 to 30% with global hypokinesis.  Findings consistent with CHF.  Lasix  60 mg IV ordered.   Consult: Dr. Marcene or for admission to medical service [facility] hospitalist.       Final diagnoses:  Acute on chronic congestive heart failure, unspecified heart failure type [facility])  Rapid atrial fibrillation [facility])    ED Discharge Orders     None           Shines Ludivina, MD 08/27/24 2335

## 2024-08-28 ENCOUNTER — Observation Stay (HOSPITAL_COMMUNITY)

## 2024-08-28 DIAGNOSIS — I4821 Permanent atrial fibrillation: Secondary | ICD-10-CM | POA: Diagnosis present

## 2024-08-28 DIAGNOSIS — K761 Chronic passive congestion of liver: Secondary | ICD-10-CM | POA: Diagnosis present

## 2024-08-28 DIAGNOSIS — Z01818 Encounter for other preprocedural examination: Secondary | ICD-10-CM | POA: Diagnosis not present

## 2024-08-28 DIAGNOSIS — J90 Pleural effusion, not elsewhere classified: Secondary | ICD-10-CM | POA: Diagnosis not present

## 2024-08-28 DIAGNOSIS — I480 Paroxysmal atrial fibrillation: Secondary | ICD-10-CM | POA: Diagnosis not present

## 2024-08-28 DIAGNOSIS — Z515 Encounter for palliative care: Secondary | ICD-10-CM | POA: Diagnosis not present

## 2024-08-28 DIAGNOSIS — R0602 Shortness of breath: Secondary | ICD-10-CM | POA: Diagnosis not present

## 2024-08-28 DIAGNOSIS — K7030 Alcoholic cirrhosis of liver without ascites: Secondary | ICD-10-CM | POA: Diagnosis present

## 2024-08-28 DIAGNOSIS — Z87891 Personal history of nicotine dependence: Secondary | ICD-10-CM | POA: Diagnosis not present

## 2024-08-28 DIAGNOSIS — I5043 Acute on chronic combined systolic (congestive) and diastolic (congestive) heart failure: Secondary | ICD-10-CM | POA: Diagnosis present

## 2024-08-28 DIAGNOSIS — N184 Chronic kidney disease, stage 4 (severe): Secondary | ICD-10-CM | POA: Diagnosis present

## 2024-08-28 DIAGNOSIS — N179 Acute kidney failure, unspecified: Secondary | ICD-10-CM | POA: Diagnosis not present

## 2024-08-28 DIAGNOSIS — N1831 Chronic kidney disease, stage 3a: Secondary | ICD-10-CM | POA: Diagnosis not present

## 2024-08-28 DIAGNOSIS — N419 Inflammatory disease of prostate, unspecified: Secondary | ICD-10-CM | POA: Diagnosis present

## 2024-08-28 DIAGNOSIS — K5730 Diverticulosis of large intestine without perforation or abscess without bleeding: Secondary | ICD-10-CM | POA: Diagnosis not present

## 2024-08-28 DIAGNOSIS — E039 Hypothyroidism, unspecified: Secondary | ICD-10-CM | POA: Diagnosis present

## 2024-08-28 DIAGNOSIS — R0989 Other specified symptoms and signs involving the circulatory and respiratory systems: Secondary | ICD-10-CM | POA: Diagnosis not present

## 2024-08-28 DIAGNOSIS — N400 Enlarged prostate without lower urinary tract symptoms: Secondary | ICD-10-CM | POA: Diagnosis not present

## 2024-08-28 DIAGNOSIS — Z7189 Other specified counseling: Secondary | ICD-10-CM | POA: Diagnosis not present

## 2024-08-28 DIAGNOSIS — R579 Shock, unspecified: Secondary | ICD-10-CM | POA: Diagnosis not present

## 2024-08-28 DIAGNOSIS — R570 Cardiogenic shock: Secondary | ICD-10-CM | POA: Diagnosis not present

## 2024-08-28 DIAGNOSIS — R0603 Acute respiratory distress: Secondary | ICD-10-CM | POA: Diagnosis not present

## 2024-08-28 DIAGNOSIS — I2510 Atherosclerotic heart disease of native coronary artery without angina pectoris: Secondary | ICD-10-CM | POA: Diagnosis not present

## 2024-08-28 DIAGNOSIS — I110 Hypertensive heart disease with heart failure: Secondary | ICD-10-CM | POA: Diagnosis not present

## 2024-08-28 DIAGNOSIS — I472 Ventricular tachycardia, unspecified: Secondary | ICD-10-CM | POA: Diagnosis present

## 2024-08-28 DIAGNOSIS — Z7901 Long term (current) use of anticoagulants: Secondary | ICD-10-CM | POA: Diagnosis not present

## 2024-08-28 DIAGNOSIS — J323 Chronic sphenoidal sinusitis: Secondary | ICD-10-CM | POA: Diagnosis not present

## 2024-08-28 DIAGNOSIS — I272 Pulmonary hypertension, unspecified: Secondary | ICD-10-CM | POA: Diagnosis present

## 2024-08-28 DIAGNOSIS — Z1152 Encounter for screening for COVID-19: Secondary | ICD-10-CM | POA: Diagnosis not present

## 2024-08-28 DIAGNOSIS — K029 Dental caries, unspecified: Secondary | ICD-10-CM | POA: Diagnosis not present

## 2024-08-28 DIAGNOSIS — Z95812 Presence of fully implantable artificial heart: Secondary | ICD-10-CM | POA: Diagnosis not present

## 2024-08-28 DIAGNOSIS — E872 Acidosis, unspecified: Secondary | ICD-10-CM | POA: Diagnosis present

## 2024-08-28 DIAGNOSIS — K7689 Other specified diseases of liver: Secondary | ICD-10-CM | POA: Diagnosis not present

## 2024-08-28 DIAGNOSIS — I5023 Acute on chronic systolic (congestive) heart failure: Secondary | ICD-10-CM

## 2024-08-28 DIAGNOSIS — I5084 End stage heart failure: Secondary | ICD-10-CM | POA: Diagnosis present

## 2024-08-28 DIAGNOSIS — E871 Hypo-osmolality and hyponatremia: Secondary | ICD-10-CM | POA: Diagnosis present

## 2024-08-28 DIAGNOSIS — Y829 Unspecified medical devices associated with adverse incidents: Secondary | ICD-10-CM | POA: Diagnosis not present

## 2024-08-28 DIAGNOSIS — I130 Hypertensive heart and chronic kidney disease with heart failure and stage 1 through stage 4 chronic kidney disease, or unspecified chronic kidney disease: Secondary | ICD-10-CM | POA: Diagnosis present

## 2024-08-28 DIAGNOSIS — T8383XA Hemorrhage of genitourinary prosthetic devices, implants and grafts, initial encounter: Secondary | ICD-10-CM | POA: Diagnosis not present

## 2024-08-28 DIAGNOSIS — D696 Thrombocytopenia, unspecified: Secondary | ICD-10-CM | POA: Diagnosis present

## 2024-08-28 DIAGNOSIS — J4489 Other specified chronic obstructive pulmonary disease: Secondary | ICD-10-CM | POA: Diagnosis present

## 2024-08-28 DIAGNOSIS — I5082 Biventricular heart failure: Secondary | ICD-10-CM | POA: Diagnosis not present

## 2024-08-28 DIAGNOSIS — I428 Other cardiomyopathies: Secondary | ICD-10-CM | POA: Diagnosis present

## 2024-08-28 DIAGNOSIS — I083 Combined rheumatic disorders of mitral, aortic and tricuspid valves: Secondary | ICD-10-CM | POA: Diagnosis present

## 2024-08-28 DIAGNOSIS — I700 Atherosclerosis of aorta: Secondary | ICD-10-CM | POA: Diagnosis not present

## 2024-08-28 DIAGNOSIS — Z95811 Presence of heart assist device: Secondary | ICD-10-CM | POA: Diagnosis not present

## 2024-08-28 DIAGNOSIS — I340 Nonrheumatic mitral (valve) insufficiency: Secondary | ICD-10-CM

## 2024-08-28 DIAGNOSIS — I4729 Other ventricular tachycardia: Secondary | ICD-10-CM | POA: Diagnosis not present

## 2024-08-28 DIAGNOSIS — I2699 Other pulmonary embolism without acute cor pulmonale: Secondary | ICD-10-CM | POA: Diagnosis not present

## 2024-08-28 DIAGNOSIS — I509 Heart failure, unspecified: Secondary | ICD-10-CM | POA: Diagnosis not present

## 2024-08-28 DIAGNOSIS — I517 Cardiomegaly: Secondary | ICD-10-CM | POA: Diagnosis not present

## 2024-08-28 DIAGNOSIS — D72829 Elevated white blood cell count, unspecified: Secondary | ICD-10-CM | POA: Diagnosis not present

## 2024-08-28 DIAGNOSIS — Z452 Encounter for adjustment and management of vascular access device: Secondary | ICD-10-CM | POA: Diagnosis not present

## 2024-08-28 DIAGNOSIS — J811 Chronic pulmonary edema: Secondary | ICD-10-CM | POA: Diagnosis not present

## 2024-08-28 LAB — ECHOCARDIOGRAM COMPLETE
Area-P 1/2: 6.32 cm2
Est EF: <20
Height: 70 in
S' Lateral: 7.71 cm
Weight: 2593.6 [oz_av]

## 2024-08-28 LAB — PROTIME-INR
INR: 1.1 (ref 0.8–1.2)
Prothrombin Time: 15.1 s (ref 11.4–15.2)

## 2024-08-28 LAB — BASIC METABOLIC PANEL WITH GFR
Anion gap: 17 — ABNORMAL HIGH (ref 5–15)
BUN: 17 mg/dL (ref 8–23)
CO2: 20 mmol/L — ABNORMAL LOW (ref 22–32)
Calcium: 9.2 mg/dL (ref 8.9–10.3)
Chloride: 100 mmol/L (ref 98–111)
Creatinine, Ser: 1.51 mg/dL — ABNORMAL HIGH (ref 0.61–1.24)
GFR, Estimated: 47 mL/min — ABNORMAL LOW (ref >60)
Glucose, Bld: 137 mg/dL — ABNORMAL HIGH (ref 70–99)
Potassium: 4.8 mmol/L (ref 3.5–5.1)
Sodium: 137 mmol/L (ref 135–145)

## 2024-08-28 LAB — CBC WITH DIFFERENTIAL/PLATELET
Abs Immature Granulocytes: 0.07 K/uL (ref 0.00–0.07)
Basophils Absolute: 0 K/uL (ref 0.0–0.1)
Basophils Relative: 0 %
Eosinophils Absolute: 0 K/uL (ref 0.0–0.5)
Eosinophils Relative: 0 %
HCT: 44.9 % (ref 39.0–52.0)
Hemoglobin: 15.6 g/dL (ref 13.0–17.0)
Immature Granulocytes: 1 %
Lymphocytes Relative: 5 %
Lymphs Abs: 0.5 K/uL — ABNORMAL LOW (ref 0.7–4.0)
MCH: 32.8 pg (ref 26.0–34.0)
MCHC: 34.7 g/dL (ref 30.0–36.0)
MCV: 94.3 fL (ref 80.0–100.0)
Monocytes Absolute: 1 K/uL (ref 0.1–1.0)
Monocytes Relative: 9 %
Neutro Abs: 9.9 K/uL — ABNORMAL HIGH (ref 1.7–7.7)
Neutrophils Relative %: 85 %
Platelets: 140 K/uL — ABNORMAL LOW (ref 150–400)
RBC: 4.76 MIL/uL (ref 4.22–5.81)
RDW: 13.2 % (ref 11.5–15.5)
WBC: 11.6 K/uL — ABNORMAL HIGH (ref 4.0–10.5)
nRBC: 0 % (ref 0.0–0.2)

## 2024-08-28 LAB — COMPREHENSIVE METABOLIC PANEL WITH GFR
ALT: 19 U/L (ref 0–44)
AST: 18 U/L (ref 15–41)
Albumin: 4.2 g/dL (ref 3.5–5.0)
Alkaline Phosphatase: 69 U/L (ref 38–126)
Anion gap: 17 — ABNORMAL HIGH (ref 5–15)
BUN: 18 mg/dL (ref 8–23)
CO2: 26 mmol/L (ref 22–32)
Calcium: 9.4 mg/dL (ref 8.9–10.3)
Chloride: 97 mmol/L — ABNORMAL LOW (ref 98–111)
Creatinine, Ser: 1.54 mg/dL — ABNORMAL HIGH (ref 0.61–1.24)
GFR, Estimated: 46 mL/min — ABNORMAL LOW (ref >60)
Glucose, Bld: 124 mg/dL — ABNORMAL HIGH (ref 70–99)
Potassium: 4.6 mmol/L (ref 3.5–5.1)
Sodium: 140 mmol/L (ref 135–145)
Total Bilirubin: 2.4 mg/dL — ABNORMAL HIGH (ref 0.0–1.2)
Total Protein: 7.5 g/dL (ref 6.5–8.1)

## 2024-08-28 LAB — MAGNESIUM: Magnesium: 2.5 mg/dL — ABNORMAL HIGH (ref 1.7–2.4)

## 2024-08-28 LAB — BRAIN NATRIURETIC PEPTIDE: B Natriuretic Peptide: 1952.5 pg/mL — ABNORMAL HIGH (ref 0.0–100.0)

## 2024-08-28 LAB — PROCALCITONIN: Procalcitonin: 0.11 ng/mL

## 2024-08-28 LAB — CK: Total CK: 47 U/L — ABNORMAL LOW (ref 49–397)

## 2024-08-28 LAB — PHOSPHORUS: Phosphorus: 5.3 mg/dL — ABNORMAL HIGH (ref 2.5–4.6)

## 2024-08-28 LAB — LACTIC ACID, PLASMA: Lactic Acid, Venous: 1.3 mmol/L (ref 0.5–1.9)

## 2024-08-28 LAB — ETHANOL: Alcohol, Ethyl (B): <15 mg/dL (ref <15)

## 2024-08-28 LAB — TSH: TSH: 1.878 u[IU]/mL (ref 0.350–4.500)

## 2024-08-28 LAB — DIGOXIN LEVEL: Digoxin Level: 0.7 ng/mL — ABNORMAL LOW (ref 0.8–2.0)

## 2024-08-28 LAB — APTT: aPTT: 35 s (ref 24–36)

## 2024-08-28 MED ORDER — PERFLUTREN LIPID MICROSPHERE
1.0000 mL | INTRAVENOUS | Status: AC | PRN
  Administered 2024-08-28: 4 mL via INTRAVENOUS

## 2024-08-28 MED ORDER — FUROSEMIDE 10 MG/ML IJ SOLN
20.0000 mg | Freq: Once | INTRAMUSCULAR | Status: AC
  Administered 2024-08-28: 20 mg via INTRAVENOUS
  Filled 2024-08-28: qty 2

## 2024-08-28 MED ORDER — FUROSEMIDE 10 MG/ML IJ SOLN
60.0000 mg | Freq: Two times a day (BID) | INTRAMUSCULAR | Status: DC
  Administered 2024-08-28 – 2024-08-29 (×2): 60 mg via INTRAVENOUS
  Filled 2024-08-28 (×2): qty 6

## 2024-08-28 NOTE — Consult Note (Signed)
 Cardiology Consultation  Patient ID: [name] [name] MRN: [id number]; DOB: [birth year]  Admit date: 08/27/2024 Date of Consult: 08/28/2024  PCP:  Cleatus Arlyss RAMAN, MD   [facility] Providers Cardiologist:  [name], MD     Patient Profile: [name] [name] is a 78 y.o. male with a hx of permanent atrial fibrillation, chronic HFrEF, nonischemic cardiomyopathy, nonobstructive coronary artery disease, hypertension, hyperlipidemia, severe MR, hypothyroidism, who is being seen 08/28/2024 for the evaluation of A-Fib, CHF at the request of Dr. Carlota.  History of Present Illness: [name] [name] has past medical history as listed above.  He presented to [facility] emergency department on 08/27/2024 via EMS with complaints of shortness of breath, chest pressure.  He reported that he had recently gone to an urgent care in [place] the day prior, he was given albuterol , prednisone , Augmentin  for reactive airway disease.  The patient went home but reported that he was feeling significantly worse with increased shortness of breath.  Upon arrival EMS noted that SpO2 was 87% on room air, HR 160.  Relevant workup in the ED/since admitted includes: BNP 1,633 ? 1,952, troponin negative x 2, respiratory panel negative, creatinine 1.49 ? 1.54 (baseline 1.1-1.2), CBC showed chronic thrombocytopenia.  CXR showed cardiomegaly, pulmonary vascular congestion.  Echocardiogram ordered, pending.  Echocardiogram from 10/2023: LVEF 25-30%, LV moderately to severely dilated, moderately reduced RV, mildly enlarged RV, moderately elevated PASP at 55.4 mmHg, severe biatrial enlargement, moderate to severe MR, moderate TR, mild to moderate AR, dilated IVC.  He was admitted to the medicine service.  Since being admitted he has received IV Lasix  60 mg x 1 dose, then started on IV Lasix  40 mg twice daily first dose today.  He was continued on Eliquis  5 mg twice daily, carvedilol  25 mg twice daily, digoxin  0.125 mg daily,  pravastatin  40 mg daily.  This morning around 9 AM, rapid response was called for HR 150s to 200s along with shortness of breath.  Rapid response nurse note indicates that patient was short of breath, belly breathing, lungs with crackles throughout.  He was given his scheduled a.m. meds of carvedilol , digoxin , IV Lasix , was also placed on BiPAP per RT and was tolerating well.  Patient presently is on room air, doing well, sitting up.  He is a patient of Dr. Hanson as well as Dr. Zenaida.  He was last seen by Dr. Hanson November 2025, his note indicates that there is no room for escalation of his GDMT due to persistent low BP.  He was continued on Eliquis , carvedilol , digoxin  for rate control with his permanent atrial fibrillation.  His home medications included: Eliquis  5 mg twice daily, carvedilol  25 mg twice daily, digoxin  0.125 mg daily, Jardiance  10 mg daily, p.o. Lasix  20 to 40 mg daily, pravastatin  40 mg daily, Entresto  24-26 mg twice daily, spironolactone  25 mg daily.  He was still following closely with our advanced heart failure clinic.  He was last seen by Dr. Zenaida June 2025.  He was also noted to be euvolemic at this appointment weighing 159 pounds.  Dr. Zenaida indicated that he may be a possible LVAD candidate if his LV function continues to deteriorate.  Patient has previously declined ICD.  After speaking with the patient, he agrees with the history as stated above. He tells me he had good urine response with IV Lasix  60 mg but not as much with 40 mg. Will increased to 60 mg BID for today and monitor response. Echocardiogram was stopped  this morning due to rapid response being called. He tells me that when he was asked to lay flat during the echocardiogram, this made it very difficult for him to breathe and his HR jumped up to 180s. Upon review of his telemetry, there was sudden onset wide complex tachycardia that may be concerning for VT. Will consult our EP team to have them further evaluate.  Patient is presently on room air and reports improvement in his symptoms. He is sitting up right in the bed and notes that his symptoms are worse when he is laying down.   Past Medical History:  Diagnosis Date   Atrial fibrillation (HCC)    CHF (congestive heart failure) (HCC)    Cirrhosis (HCC)    ED (erectile dysfunction)    Hyperglycemia    Hyperlipidemia    Hypertension    Internal hemorrhoids    Myocardial infarction (HCC) 2008   SCCA (squamous cell carcinoma) of skin    Left ear.  MOHS surgery 2014 [facility]   Skin cancer of face    2012 basal cell   Thyroid  disease    Past Surgical History:  Procedure Laterality Date   A-Fib Cardiomyopathy [facility])  10/2006   EF 25 - 35%   DOPPLER ECHOCARDIOGRAPHY  10/31/2006   EF 25-35% Mod. M.R., mild -mod TR   RIGHT/LEFT HEART CATH AND CORONARY ANGIOGRAPHY Bilateral 08/31/2023   Procedure: RIGHT/LEFT HEART CATH AND CORONARY ANGIOGRAPHY;  Surgeon: Mady Bruckner, MD;  Location: [facility] INVASIVE CV LAB;  Service: Cardiovascular;  Laterality: Bilateral;    Home Medications:  Prior to Admission medications   Medication Sig Start Date End Date Taking? Authorizing Provider  albuterol  (VENTOLIN  HFA) 108 (90 Base) MCG/ACT inhaler Inhale 2 puffs into the lungs every 4 (four) hours as needed for wheezing or shortness of breath. 08/27/24  Yes Graham, Laura E, PA-C  amoxicillin -clavulanate (AUGMENTIN ) 875-125 MG tablet Take 1 tablet by mouth 2 (two) times daily.   Yes [provider]  apixaban  (ELIQUIS ) 5 MG TABS tablet Take 1 tablet by mouth twice daily 08/16/24  Yes End, Bruckner, MD  carvedilol  (COREG ) 25 MG tablet TAKE 1 TABLET BY MOUTH TWICE DAILY WITH A MEAL 06/12/24  Yes End, Bruckner, MD  digoxin  (LANOXIN ) 0.125 MG tablet Take 0.125 mg by mouth daily.   Yes [provider]  doxazosin  (CARDURA ) 4 MG tablet Take 1 tablet (4 mg total) by mouth at bedtime. 01/06/24  Yes Cleatus Arlyss RAMAN, MD  empagliflozin  (JARDIANCE ) 10  MG TABS tablet Take 1 tablet (10 mg total) by mouth daily before breakfast. 10/04/23  Yes End, Bruckner, MD  ENTRESTO  24-26 MG Take 1 tablet by mouth 2 (two) times daily. 12/12/18  Yes Cleatus Arlyss RAMAN, MD  fish oil-omega-3 fatty acids 1000 MG capsule Take 1 g by mouth daily.   Yes [provider]  fluticasone -salmeterol (ADVAIR) 250-50 MCG/ACT AEPB INHALE 1 DOSE BY MOUTH IN THE MORNING AND AT BEDTIME RINSE MOUTH AFTER USE 08/20/24  Yes Cleatus Arlyss RAMAN, MD  furosemide  (LASIX ) 20 MG tablet Take 1-2 tablets (20-40 mg total) by mouth daily. Patient taking differently: Take 40 mg by mouth 3 (three) times a week. 01/06/24  Yes Cleatus Arlyss RAMAN, MD  furosemide  (LASIX ) 40 MG tablet Take 40 mg by mouth daily. 05/08/24  Yes [provider]  levothyroxine  (SYNTHROID ) 137 MCG tablet Take 1 tablet (137 mcg total) by mouth daily before breakfast. 01/06/24  Yes Cleatus Arlyss RAMAN, MD  Melatonin 5 MG CAPS Take 5  mg by mouth daily.  12/18/14  Yes [provider]  Multiple Vitamin (MULTIVITAMIN) tablet Take 1 tablet by mouth daily.   Yes [provider]  pravastatin  (PRAVACHOL ) 40 MG tablet Take 1 tablet (40 mg total) by mouth daily. Patient taking differently: Take 40 mg by mouth at bedtime. 01/06/24  Yes Cleatus Arlyss RAMAN, MD  predniSONE  (DELTASONE ) 10 MG tablet Take 2 tablets (20 mg total) by mouth daily for 5 days. 08/27/24 09/01/24 Yes Graham, Laura E, PA-C  sildenafil  (VIAGRA ) 100 MG tablet Take 1 tablet (100 mg total) by mouth daily as needed for erectile dysfunction. 03/01/24  Yes Cleatus Arlyss RAMAN, MD  spironolactone  (ALDACTONE ) 25 MG tablet Take 1 tablet (25 mg total) by mouth daily. 01/06/24  Yes Cleatus Arlyss RAMAN, MD    Scheduled Meds:  apixaban   5 mg Oral BID   carvedilol   25 mg Oral BID WC   digoxin   0.125 mg Oral Daily   furosemide   20 mg Intravenous Once   furosemide   60 mg Intravenous BID   levothyroxine   137 mcg Oral Q0600   pravastatin   40 mg Oral Daily   Continuous  Infusions:  PRN Meds: acetaminophen  **OR** acetaminophen , melatonin, metoprolol tartrate, ondansetron  (ZOFRAN ) IV  Allergies:   No Known Allergies  Social History:   Social History   Socioeconomic History   Marital status: Married    Spouse name: Not on file   Number of children: 2   Years of education: Not on file   Highest education level: GED or equivalent  Occupational History   Occupation: [employer], Market Researcher business    Employer: RETIRED   Occupation: Airline Pilot, Tour Manager [employer]   Occupation: Semi-retired  Tobacco Use   Smoking status: Former    Current packs/day: 0.00    Average packs/day: 0.3 packs/day for 1 year (0.3 ttl pk-yrs)    Types: Cigarettes    Quit date: 02/26/1966    Years since quitting: 58.5   Smokeless tobacco: Never   Tobacco comments:    Less than six months  Vaping Use   Vaping status: Never Used  Substance and Sexual Activity   Alcohol use: Not Currently    Comment: 3-4 glasses of wine per week.   Drug use: Never   Sexual activity: Yes  Other Topics Concern   Not on file  Social History Narrative   Left handed   2 sons initially, 1 died 08-05-09 from lymphoma, his other son is local   From [place], [place]   Prev worked at [email or web address] as of 09-04-2022   Enjoys working in the yard   Widowed 09/04/2021, was married 09/04/66 (First wife died of pancreatic cancer about 6 weeks after diagnosis in 2021/09/04.)   5 living grandkids (1 is deceased)   2 great grandkids      remarried 02/2023   Social Drivers of Corporate Investment Banker Strain: Low Risk  (01/04/2024)   Overall Financial Resource Strain (CARDIA)    Difficulty of Paying Living Expenses: Not hard at all  Food Insecurity: No Food Insecurity (08/28/2024)   Hunger Vital Sign    Worried About Running Out of Food in the Last Year: Never true    Ran Out of Food in the Last Year: Never true  Transportation Needs: No Transportation Needs (08/28/2024)   PRAPARE - Scientist, Research (physical Sciences) (Medical): No    Lack of Transportation (Non-Medical): No  Physical Activity: Inactive (01/04/2024)   Exercise Vital Sign  Days of Exercise per Week: 0 days    Minutes of Exercise per Session: 0 min  Stress: No Stress Concern Present (01/04/2024)   [employer] of Occupational Health - Occupational Stress Questionnaire    Feeling of Stress : Not at all  Social Connections: Moderately Integrated (08/28/2024)   Social Connection and Isolation Panel    Frequency of Communication with Friends and Family: Three times a week    Frequency of Social Gatherings with Friends and Family: Three times a week    Attends Religious Services: 1 to 4 times per year    Active Member of Clubs or Organizations: No    Attends Banker Meetings: Never    Marital Status: Married  Catering Manager Violence: Not At Risk (08/28/2024)   Humiliation, Afraid, Rape, and Kick questionnaire    Fear of Current or Ex-Partner: No    Emotionally Abused: No    Physically Abused: No    Sexually Abused: No    Family History:   Family History  Problem Relation Age of Onset   Hypertension Mother    Cancer Mother        Bilat. breast dz, s/p mastectomy, Bilat   Colon cancer Mother        possible   Colon polyps Mother    Heart disease Father        CHF, S/P CABG (38) Pacer Incr BP   Hyperlipidemia Father    Heart disease Brother 55       CAD- stent and ICD/pacer   Prostate cancer Neg Hx     ROS:  Please see the history of present illness.  All other ROS reviewed and negative.     Physical Exam/Data: Vitals:   08/28/24 0856 08/28/24 0909 08/28/24 0911 08/28/24 1036  BP: 120/88 120/88 120/88 107/77  Pulse: 95 (!) 103 83 (!) 102  Resp: (!) 24 (!) 24 (!) 24 20  Temp: 98 F (36.7 C)   98.6 F (37 C)  TempSrc: Axillary   Oral  SpO2: 95%  99% 95%  Weight:      Height:        Intake/Output Summary (Last 24 hours) at 08/28/2024 1122 Last data filed at 08/28/2024 0500 Gross per 24  hour  Intake --  Output 1000 ml  Net -1000 ml      08/27/2024   11:03 PM 08/27/2024   10:30 PM 08/27/2024    7:06 PM  Last 3 Weights  Weight (lbs) 162 lb 1.6 oz 169 lb 12.1 oz 169 lb 12.1 oz  Weight (kg) 73.528 kg 77 kg 77 kg     Body mass index is 23.26 kg/m.   General:  in no acute distress, presently on room air, previously required BiPAP HEENT: normal Vascular:Distal pulses 2+ bilaterally Cardiac:  normal S1, S2; mildly tachycardic, + murmur  Lungs:  decreased breath sounds throughout, no crackles  Abd: mildly distended, nontender, no hepatomegaly  Ext: mild LE edema Musculoskeletal:  No deformities Skin: warm and dry  Neuro:  no focal abnormalities noted Psych:  Normal affect   EKG:  The EKG was personally reviewed and demonstrates: A-fib RVR, PVCs, HR 113  Telemetry:  Telemetry was personally reviewed and demonstrates:  A-Fib with rates 90-120s, episode of concerning rhythm 12/1 around 8:40-8:50 AM concerning for possible VT, rates up to 180s  Relevant CV Studies:  Echocardiogram, 08/28/2024 Ordered pending results  Echocardiogram, 11/16/2023 Left ventricular ejection fraction, by estimation, is 25 to 30% . Left ventricular ejection  fraction by PLAX is 26 % . The left ventricle has severely decreased function. The left ventricle demonstrates global hypokinesis. The left ventricular internal cavity size was moderately to severely dilated. Left ventricular diastolic parameters are indeterminate. The average left ventricular global longitudinal strain is - 5. 5 % . The global longitudinal strain is abnormal.  Right ventricular systolic function is moderately reduced. The right ventricular size is mildly enlarged. There is moderately elevated pulmonary artery systolic pressure. The estimated right ventricular systolic pressure is 55. 4 mmHg.  Left atrial size was severely dilated.  Right atrial size was severely dilated.  The mitral valve is normal in structure. Moderate to  severe mitral valve regurgitation. No evidence of mitral stenosis.  Tricuspid valve regurgitation is moderate.  The aortic valve is normal in structure. Aortic valve regurgitation is mild to moderate. No aortic stenosis is present. Aortic valve mean gradient measures 8. 0 mmHg.  There is borderline dilatation of the ascending aorta, measuring 39 mm.  The inferior vena cava is dilated in size with > 50% respiratory variability, suggesting right atrial pressure of 8 mmHg.  Right/left heart cath, 08/31/2023 Mild to moderate, non-obstructive coronary artery disease, as detailed below. Mildly elevated left heart filling pressures. Moderately elevated right heart and pulmonary artery pressures. Low normal Fick cardiac output/index.   Recommendations: Escalate diuresis. Titrate GDMT as tolerated.  Consider repeat echocardiogram once volume status and GDMT has been optimized.  Consider advanced heart failure consultation if LVEF remains severely reduced and/or significant mitral regurgitation is still present  Laboratory Data: High Sensitivity Troponin:   Recent Labs  Lab 08/27/24 1907 08/27/24 2059  TROPONINIHS 11 15     Chemistry Recent Labs  Lab 08/27/24 1907 08/27/24 2349 08/28/24 0204  NA 134* 137 140  K 5.5* 4.8 4.6  CL 98 100 97*  CO2 19* 20* 26  GLUCOSE 189* 137* 124*  BUN 16 17 18   CREATININE 1.49* 1.51* 1.54*  CALCIUM 9.2 9.2 9.4  MG 2.9*  --  2.5*  GFRNONAA 48* 47* 46*  ANIONGAP 17* 17* 17*    Recent Labs  Lab 08/28/24 0204  PROT 7.5  ALBUMIN 4.2  AST 18  ALT 19  ALKPHOS 69  BILITOT 2.4*   Lipids No results for input(s): CHOL, TRIG, HDL, LABVLDL, LDLCALC, CHOLHDL in the last 168 hours.  Hematology Recent Labs  Lab 08/27/24 1907 08/28/24 0204  WBC 8.2 11.6*  RBC 4.78 4.76  HGB 15.9 15.6  HCT 47.4 44.9  MCV 99.2 94.3  MCH 33.3 32.8  MCHC 33.5 34.7  RDW 13.1 13.2  PLT 121* 140*   Thyroid   Recent Labs  Lab 08/27/24 2349  TSH 1.878     BNP Recent Labs  Lab 08/27/24 1907 08/28/24 0204  BNP 1,633.9* 1,952.5*    DDimer No results for input(s): DDIMER in the last 168 hours.  Radiology/Studies:  DG Chest Port 1 View Result Date: 08/27/2024 EXAM: 1 VIEW(S) XRAY OF THE CHEST 08/27/2024 07:37:00 PM COMPARISON: 12/22/2021 CLINICAL HISTORY: sob FINDINGS: LUNGS AND PLEURA: Pulmonary vascular congestion. No focal consolidation. No pleural effusion. No pneumothorax. HEART AND MEDIASTINUM: Cardiomegaly. Atherosclerotic calcifications of the aorta. BONES AND SOFT TISSUES: Surgical clips in right supraclavicular region. No acute osseous abnormality. IMPRESSION: 1. Cardiomegaly and pulmonary vascular congestion. Electronically signed by: Norman Gatlin MD 08/27/2024 07:52 PM EST RP Workstation: [id number]   Assessment and Plan:  Acute on chronic combined heart failure Echo from 10/2023: LVEF 25 to 30%, moderately to severely dilated LV, moderately reduced  RV, mildly enlarged RV, moderately elevated PASP at 55.4 mmHg, dilated IVC BNP 1,633 ? 1,952 Followed by AHF, Dr. Zenaida, as an outpatient Home meds: Entresto  24-26 mg BID, spironolactone  25 mg daily, carvedilol  25 mg BID, Jardiance  10 mg daily, Lasix  40 mg daily, digoxin  0.125 mg daily Digoxin  level 0.7 Previously declined ICD Dr. Zenaida noted would be a reasonable LVAD candidate if his LV function continues to deteriorate Difficult to titrate GDMT any further due to hypotension Given IV Lasix  60 mg x 1, started on 40 mg twice daily Creatinine 1.49 ? 1.54 (baseline 1.1-1.2) Euvolemic in 02/2024 at AHF appointment, weight 72.6 kg Weight this admission: 73.5 kg Pending updated echocardiogram this admission Will follow echocardiogram and consider consulting AHF team if appropriate Continue IV Lasix , increased to 60 mg BID today, follow response  Currently on home carvedilol  25 mg BID  Currently on home digoxin  0.125 mg daily  Holding other GDMT at the moment with low-normal BP  and history of hypotension, will add back as tolerated  Moderate to severe MR Moderate TR Mild to moderate AR Noted on echocardiogram from 10/2023 Previously noted that he is likely out of the window for potential benefit from a MitraClip Pending updated echocardiogram this admission  Permanent atrial fibrillation Wide complex tachycardia Echo 10/2023: Severe biatrial enlargement Home meds: Eliquis  5 mg twice daily, carvedilol  25 mg twice daily, digoxin  0.125 mg daily Currently in A-fib with HR 90-100s Digoxin  level 0.7 Concerning rhythm on telemetry 12/1 8:40-8:50 AM Currently on home carvedilol  25 mg BID, Eliquis  5 mg BID, digoxin  0.125 mg daily Consulting our EP team today for further evaluation   Nonobstructive coronary artery disease Hyperlipidemia LHC from 08/2023: Mild to moderate nonobstructive coronary artery disease 12/30/2023: HDL 60.10; LDL Cholesterol 76 08/28/2024: ALT 19  Troponin negative x 2 Continue pravastatin  40 mg daily No aspirin  given Eliquis  use  Per primary Hypothyroidism  Electrolyte disturbances  Lactic acidosis, resolved   Risk Assessment/Risk Scores:      New York  Heart Association (NYHA) Functional Class NYHA Class III  CHA2DS2-VASc Score = 5   This indicates a 7.2% annual risk of stroke. The patient's score is based upon: CHF History: 1 HTN History: 1 Diabetes History: 0 Stroke History: 0 Vascular Disease History: 1 Age Score: 2 Gender Score: 0        For questions or updates, please contact [facility] Please consult [email or web address] for contact info under   Signed, Waddell DELENA Donath, PA-C  08/28/2024 11:22 AM

## 2024-08-28 NOTE — Progress Notes (Signed)
 Mobility Specialist Progress Note:    08/28/24 1355  Mobility  Activity Turned to back - supine (Ankle Pumps, Leg Ext, Heel Slide)  Level of Assistance Contact guard assist, steadying assist  Assistive Device None  Range of Motion/Exercises Active Assistive  Activity Response Tolerated fair  Mobility Referral No  Mobility visit 1 Mobility  Mobility Specialist Start Time (ACUTE ONLY) 1355  Mobility Specialist Stop Time (ACUTE ONLY) 1401  Mobility Specialist Time Calculation (min) (ACUTE ONLY) 6 min   Received pt laying in bed agreeable to session. Pt not feeling to well, somewhat fatigued and having HR issues. Pt able to move and perform exercises w/ assist. Left pt in room w/ all needs met.   Venetia Keel Mobility Specialist Please Neurosurgeon or Rehab Office at [phone]

## 2024-08-28 NOTE — TOC CM/SW Note (Signed)
 Transition of Care [facility]) - Inpatient Brief Assessment   Patient Details  Name: [name] [name] MRN: [id number] Date of Birth: Aug 17, 1946  [facility] CM/SW Contact:    Waddell Barnie Rama, RN Phone Number: 08/28/2024, 4:13 PM   Clinical Narrative: From home with spouse, has PCP and insurance on file, states has no HH services in place at this time ,has walker and cane at home.  States family member (wife) will transport them home at [employer] and family is support system, states gets medications from [facility] in [place] on [address]  Pta self ambulatory.   There are no ICM needs identified  at this time.  Please place consult for ICM needs.     Transition of Care Asessment: Insurance and Status: Insurance coverage has been reviewed Patient has primary care physician: Yes Home environment has been reviewed: home with wife Prior level of function:: indep Prior/Current Home Services: Current home services (walker and cane in storage) Social Drivers of Health Review: SDOH reviewed no interventions necessary Readmission risk has been reviewed: Yes Transition of care needs: no transition of care needs at this time

## 2024-08-28 NOTE — Progress Notes (Signed)
 Consultation Progress Note   Patient: [name] [name] FMW:[id number] DOB: 1946/02/27 DOA: 08/27/2024 DOS: the patient was seen and examined on 08/28/2024 Primary service: DibiaLandon BRAVO, MD  Brief hospital course:  78 y.o. male with medical history significant for chronic biventricular systolic heart failure, paroxysmal atrial fibrillation chronically anticoagulated on Eliquis , essential hypertension, acquired hypothyroidism, who is admitted to [facility] on 08/27/2024 with acute on chronic systolic heart failure after presenting from home to [facility] ED complaining of shortness of breath of 2 to 3 days duration.  Associated with productive cough, lower extremity edema.  Of note patient was recently seen at urgent care for the above symptoms and there was some suspicion for underlying pneumonia.  He was discharged on prednisone , p.o. antibiotics Augmentin  and as needed albuterol  inhaler.  Patient symptoms did not improve hence EMS was summoned and he was found to have oxygen saturations of 87% on room air.    Patient was transported to the ER.  He was afebrile; in atrial fibrillation his initial heart rates were noted to be in the 160s, which is slightly improved into the range of the 90s to 120s following initiation of IV diuresis efforts; systolic blood pressures been in the 120s to 150s; respiratory rate 15-27; 93 to 100% on room air. Chest x-ray was concerning for cardiomegaly with increased pulmonary vascular congestion in the absence of infiltrate, pleural effusion and pneumothorax.  EKG showed atrial fibrillation with a heart rate of 96.  No ST-T wave changes noted.  The setting of acute on chronic systolic heart failure he was placed on BiPAP however he declined patient also received Lasix  60 mg,, Xopenex nebulizer x 1, Lopressor 5 mg IV x 1 dose.    Assessment and Plan: Principal Problem:   Acute on chronic systolic heart failure (HCC) Active Problems:   Acquired hypothyroidism   SOB  (shortness of breath)   Paroxysmal atrial fibrillation with RVR (HCC)   Hyperkalemia   Lactic acidosis   High anion gap metabolic acidosis   HLD (hyperlipidemia)  Acute on chronic systolic heart failure - Presented with dyspnea of 2 to 3 days duration. - Chest x-ray concerning for cardiomegaly and pulmonary vascular congestion. - Elevated BNP-1,633 ? 1,952  - Last echo reviewed which showed an EF of 25 to 30%. - Initially on 40 mg of Lasix  switched to 60 mg of Lasix  twice daily. - Cardiology consulted - Continue GDMT as Tolerated- -Continue carvedilol  25 mg BID, Jardiance  10 mg daily, Lasix  60mg  daily digoxin  0.125 mg daily\ -Holding  Entresto  24-26 mg BID, spironolactone  25 mg daily due to soft BP.  Cardiology titrating medications - Echo pending from this admission  Atrial fibrillation with RVR Patient known history of atrial fibrillation. Earlier this morning heart rate went up to the 150s, and his echo cardiogram this was associated with shortness of breath while laying flat.   Subsequently heart rate has improved, cardiology concerned about sudden onset wide-complex tachycardia, EP team consulted  - Continue beta-blockers, Eliquis  digoxin   AKI - Serum creatinine of 1.56, monitor closely with diuresis. Trend kidney functions daily.  Hypothyroidism Continue Synthroid . Follow-up TSH level.  Hyperlipidemia Continue pravastatin .       TRH will continue to follow the patient.  Subjective: Seen at bedside this morning during rapid response.  Patient reports worsening shortness of breath when he was told to lay flat for an echocardiogram.  RN states that at the time his heart rate went up to the 150s, EKG was obtained.  Patient was diaphoretic and tachypneic. Was placed on a BiPAP, received Lasix , digoxin , metoprolol, with subsequent improvement in his hemodynamics  Physical Exam: Vitals:   08/28/24 0856 08/28/24 0909 08/28/24 0911 08/28/24 1036  BP: 120/88 120/88 120/88  107/77  Pulse: 95 (!) 103 83 (!) 102  Resp: (!) 24 (!) 24 (!) 24 20  Temp: 98 F (36.7 C)   98.6 F (37 C)  TempSrc: Axillary   Oral  SpO2: 95%  99% 95%  Weight:      Height:       General  No acute Distress Eyes: PERRL, lids and conjunctivae normal ENMT: Mucous membranes are moist.   Neck: normal, supple, no masses, no thyromegaly Respiratory: BiPAP diminished bilaterally, basilar crackles.  Cardiovascular: Regular rate and rhythm, no murmurs / rubs / gallops Abdomen: Soft, nontender nondistended. Musculoskeletal: no clubbing / cyanosis. No joint deformity upper and lower extremities.  Skin: no rashes, lesions, ulcers. No induration Neurologic: Facial asymmetry, moving extremity spontaneously, speech fluent. Psychiatric: Normal judgment and insight. Alert and oriented x 3. Normal mood.   Data Reviewed:    CBC    Component Value Date/Time   WBC 11.6 (H) 08/28/2024 0204   RBC 4.76 08/28/2024 0204   HGB 15.6 08/28/2024 0204   HGB 13.5 08/16/2024 1508   HCT 44.9 08/28/2024 0204   HCT 41.4 08/16/2024 1508   PLT 140 (L) 08/28/2024 0204   PLT 120 (L) 08/16/2024 1508   MCV 94.3 08/28/2024 0204   MCV 100 (H) 08/16/2024 1508   MCH 32.8 08/28/2024 0204   MCHC 34.7 08/28/2024 0204   RDW 13.2 08/28/2024 0204   RDW 12.6 08/16/2024 1508   LYMPHSABS 0.5 (L) 08/28/2024 0204   MONOABS 1.0 08/28/2024 0204   EOSABS 0.0 08/28/2024 0204   BASOSABS 0.0 08/28/2024 0204    CMP     Component Value Date/Time   NA 140 08/28/2024 0204   NA 138 08/16/2024 1508   K 4.6 08/28/2024 0204   CL 97 (L) 08/28/2024 0204   CO2 26 08/28/2024 0204   GLUCOSE 124 (H) 08/28/2024 0204   BUN 18 08/28/2024 0204   BUN 20 08/16/2024 1508   CREATININE 1.54 (H) 08/28/2024 0204   CALCIUM 9.4 08/28/2024 0204   PROT 7.5 08/28/2024 0204   ALBUMIN 4.2 08/28/2024 0204   AST 18 08/28/2024 0204   ALT 19 08/28/2024 0204   ALKPHOS 69 08/28/2024 0204   BILITOT 2.4 (H) 08/28/2024 0204   GFR 51.62 (L) 12/30/2023  0822   EGFR 53 (L) 08/16/2024 1508   GFRNONAA 46 (L) 08/28/2024 0204    Family Communication: Wife at bedside updated.  Time spent: 35 minutes.  Author: Landon FORBES Baller, MD 08/28/2024 12:16 PM  For on call review [email or web address].

## 2024-08-28 NOTE — Progress Notes (Signed)
 9157: Received call from CCMD pt heart rate sustaining 150's. Checked on patient, pt lying in bed with head of bed approx 30 degrees with ECHO in progress. Pt denies chest pain but reports shortness of breath.  HR then increased to 170's/180's sustaining.  This RN paged attending provider Landon Baller, MD. Provider came to bedside.  9148: Rapid called. EKG at bedside performed. 5 mg IV Lopressor given by this RN per [facility]. Pt able to bear down and HR decreased to 110's/120's.  Respiratory at bedside and placed pt back on BIPAP for support with breathing. IV lasix  given per [facility].  0926: HR sustaining 80's/110's. Patient reports feeling better with breathing.

## 2024-08-28 NOTE — Progress Notes (Signed)
 RT called to patient room by rapid response RN.  Patient's HR had become elevated and patient was having some shortness of breath.  Patient was placed on bipap and is tolerating well at this time.  Will continue to monitor.

## 2024-08-28 NOTE — Plan of Care (Signed)

## 2024-08-28 NOTE — Consult Note (Signed)
 Cardiology Consultation   Patient ID: [name] [name] MRN: [id number]; DOB: 01/22/1946  Admit date: 08/27/2024 Date of Consult: 08/28/2024  PCP:  Cleatus Arlyss RAMAN, MD   [facility] Providers Cardiologist:  [name], MD    AHF: Dr. Zenaida  Patient Profile: [name] [name] is a 78 y.o. male with a hx of HN, HLD,  CAD (non-obstructive by cath Dec 2024)  CM (felt to be mixed ischemic/non-ischemic, patient has declined/deferred ICD) Severe MR (functional) AFib described as permanent   who is being seen 08/28/2024 for the evaluation of WCT at the request of Dr. Loni.  History of Present Illness: [name] [name] was is followed out pt by AHF and cardiology teams, admitted with acute on chronic CHF exacerbation Noted on telemetry with have WCT and EP is asked to weigh in.  LABS K+ 4.6 Mag 2.5 BUN/Creat 18/1.54 BNP 1633 >> 1925 Lactic Acid 2.0 > 1.3 Procal 0.11 WBC 11.6 H/H 15/44 Plts 140 Dig level 0.7 TSH 1.087  He reports good baseline capacity with minimal if any intolerances/  Saturday somewhat suddenly became quite SOB and came in. He has good medication compliance, denies any unusual dietary indiscretions No particular cardiac awareness of rate/rhythm   Past Medical History:  Diagnosis Date   Atrial fibrillation (HCC)    CHF (congestive heart failure) (HCC)    Cirrhosis (HCC)    ED (erectile dysfunction)    Hyperglycemia    Hyperlipidemia    Hypertension    Internal hemorrhoids    Myocardial infarction (HCC) 2008   SCCA (squamous cell carcinoma) of skin    Left ear.  MOHS surgery 2014 [facility]   Skin cancer of face    2012 basal cell   Thyroid  disease     Past Surgical History:  Procedure Laterality Date   A-Fib Cardiomyopathy [facility]  10/2006   EF 25 - 35%   DOPPLER ECHOCARDIOGRAPHY  10/31/2006   EF 25-35% Mod. M.R., mild -mod TR   RIGHT/LEFT HEART CATH AND CORONARY ANGIOGRAPHY Bilateral 08/31/2023   Procedure: RIGHT/LEFT HEART CATH  AND CORONARY ANGIOGRAPHY;  Surgeon: Hanson Lonni, MD;  Location: [facility] INVASIVE CV LAB;  Service: Cardiovascular;  Laterality: Bilateral;     Home Medications:  Prior to Admission medications   Medication Sig Start Date End Date Taking? Authorizing Provider  albuterol  (VENTOLIN  HFA) 108 (90 Base) MCG/ACT inhaler Inhale 2 puffs into the lungs every 4 (four) hours as needed for wheezing or shortness of breath. 08/27/24  Yes Graham, Laura E, PA-C  amoxicillin -clavulanate (AUGMENTIN ) 875-125 MG tablet Take 1 tablet by mouth 2 (two) times daily.   Yes [provider]  apixaban  (ELIQUIS ) 5 MG TABS tablet Take 1 tablet by mouth twice daily 08/16/24  Yes End, Lonni, MD  carvedilol  (COREG ) 25 MG tablet TAKE 1 TABLET BY MOUTH TWICE DAILY WITH A MEAL 06/12/24  Yes End, Lonni, MD  digoxin  (LANOXIN ) 0.125 MG tablet Take 0.125 mg by mouth daily.   Yes [provider]  doxazosin  (CARDURA ) 4 MG tablet Take 1 tablet (4 mg total) by mouth at bedtime. 01/06/24  Yes Cleatus Arlyss RAMAN, MD  empagliflozin  (JARDIANCE ) 10 MG TABS tablet Take 1 tablet (10 mg total) by mouth daily before breakfast. 10/04/23  Yes End, Lonni, MD  ENTRESTO  24-26 MG Take 1 tablet by mouth 2 (two) times daily. 12/12/18  Yes Cleatus Arlyss RAMAN, MD  fish oil-omega-3 fatty acids 1000 MG capsule Take 1 g by mouth daily.   Yes [provider]  fluticasone -salmeterol (ADVAIR) 250-50 MCG/ACT AEPB INHALE 1 DOSE BY MOUTH IN THE MORNING AND AT BEDTIME RINSE MOUTH AFTER USE 08/20/24  Yes Cleatus Arlyss RAMAN, MD  furosemide  (LASIX ) 20 MG tablet Take 1-2 tablets (20-40 mg total) by mouth daily. Patient taking differently: Take 40 mg by mouth 3 (three) times a week. 01/06/24  Yes Cleatus Arlyss RAMAN, MD  furosemide  (LASIX ) 40 MG tablet Take 40 mg by mouth daily. 05/08/24  Yes [provider]  levothyroxine  (SYNTHROID ) 137 MCG tablet Take 1 tablet (137 mcg total) by mouth daily before breakfast. 01/06/24  Yes Cleatus Arlyss RAMAN, MD  Melatonin 5 MG CAPS Take 5 mg by mouth daily.  12/18/14  Yes [provider]  Multiple Vitamin (MULTIVITAMIN) tablet Take 1 tablet by mouth daily.   Yes [provider]  pravastatin  (PRAVACHOL ) 40 MG tablet Take 1 tablet (40 mg total) by mouth daily. Patient taking differently: Take 40 mg by mouth at bedtime. 01/06/24  Yes Cleatus Arlyss RAMAN, MD  predniSONE  (DELTASONE ) 10 MG tablet Take 2 tablets (20 mg total) by mouth daily for 5 days. 08/27/24 09/01/24 Yes Graham, Laura E, PA-C  sildenafil  (VIAGRA ) 100 MG tablet Take 1 tablet (100 mg total) by mouth daily as needed for erectile dysfunction. 03/01/24  Yes Cleatus Arlyss RAMAN, MD  spironolactone  (ALDACTONE ) 25 MG tablet Take 1 tablet (25 mg total) by mouth daily. 01/06/24  Yes Cleatus Arlyss RAMAN, MD    Scheduled Meds:  apixaban   5 mg Oral BID   carvedilol   25 mg Oral BID WC   digoxin   0.125 mg Oral Daily   furosemide   60 mg Intravenous BID   levothyroxine   137 mcg Oral Q0600   pravastatin   40 mg Oral Daily   Continuous Infusions:  PRN Meds: acetaminophen  **OR** acetaminophen , melatonin, metoprolol tartrate, ondansetron  (ZOFRAN ) IV  Allergies:   No Known Allergies  Social History:   Social History   Socioeconomic History   Marital status: Married    Spouse name: Not on file   Number of children: 2   Years of education: Not on file   Highest education level: GED or equivalent  Occupational History   Occupation: [employer], Market Researcher business    Employer: RETIRED   Occupation: Airline Pilot, Tour Manager [employer]   Occupation: Semi-retired  Tobacco Use   Smoking status: Former    Current packs/day: 0.00    Average packs/day: 0.3 packs/day for 1 year (0.3 ttl pk-yrs)    Types: Cigarettes    Quit date: 02/26/1966    Years since quitting: 58.5   Smokeless tobacco: Never   Tobacco comments:    Less than six months  Vaping Use   Vaping status: Never Used  Substance and Sexual Activity   Alcohol use:  Not Currently    Comment: 3-4 glasses of wine per week.   Drug use: Never   Sexual activity: Yes  Other Topics Concern   Not on file  Social History Narrative   Left handed   2 sons initially, 1 died 09-02-2009 from lymphoma, his other son is local   From [place], [place]   Prev worked at [email or web address] as of 02-Oct-2022   Enjoys working in the yard   Widowed 10-02-2021, was married 10/02/1966 (First wife died of pancreatic cancer about 6 weeks after diagnosis in 10-02-2021.)   5 living grandkids (1 is deceased)   2 great grandkids      remarried 02/2023   Social Drivers of Dispensing Optician  Resource Strain: Low Risk  (01/04/2024)   Overall Financial Resource Strain (CARDIA)    Difficulty of Paying Living Expenses: Not hard at all  Food Insecurity: No Food Insecurity (08/28/2024)   Hunger Vital Sign    Worried About Running Out of Food in the Last Year: Never true    Ran Out of Food in the Last Year: Never true  Transportation Needs: No Transportation Needs (08/28/2024)   PRAPARE - Administrator, Civil Service (Medical): No    Lack of Transportation (Non-Medical): No  Physical Activity: Inactive (01/04/2024)   Exercise Vital Sign    Days of Exercise per Week: 0 days    Minutes of Exercise per Session: 0 min  Stress: No Stress Concern Present (01/04/2024)   [employer] of Occupational Health - Occupational Stress Questionnaire    Feeling of Stress : Not at all  Social Connections: Moderately Integrated (08/28/2024)   Social Connection and Isolation Panel    Frequency of Communication with Friends and Family: Three times a week    Frequency of Social Gatherings with Friends and Family: Three times a week    Attends Religious Services: 1 to 4 times per year    Active Member of Clubs or Organizations: No    Attends Banker Meetings: Never    Marital Status: Married  Catering Manager Violence: Not At Risk (08/28/2024)   Humiliation, Afraid, Rape, and Kick questionnaire    Fear of  Current or Ex-Partner: No    Emotionally Abused: No    Physically Abused: No    Sexually Abused: No    Family History:   Family History  Problem Relation Age of Onset   Hypertension Mother    Cancer Mother        Bilat. breast dz, s/p mastectomy, Bilat   Colon cancer Mother        possible   Colon polyps Mother    Heart disease Father        CHF, S/P CABG (64) Pacer Incr BP   Hyperlipidemia Father    Heart disease Brother 50       CAD- stent and ICD/pacer   Prostate cancer Neg Hx      ROS:  Please see the history of present illness.  All other ROS reviewed and negative.     Physical Exam/Data: Vitals:   08/28/24 0856 08/28/24 0909 08/28/24 0911 08/28/24 1036  BP: 120/88 120/88 120/88 107/77  Pulse: 95 (!) 103 83 (!) 102  Resp: (!) 24 (!) 24 (!) 24 20  Temp: 98 F (36.7 C)   98.6 F (37 C)  TempSrc: Axillary   Oral  SpO2: 95%  99% 95%  Weight:      Height:        Intake/Output Summary (Last 24 hours) at 08/28/2024 1428 Last data filed at 08/28/2024 1419 Gross per 24 hour  Intake 417 ml  Output 1000 ml  Net -583 ml      08/27/2024   11:03 PM 08/27/2024   10:30 PM 08/27/2024    7:06 PM  Last 3 Weights  Weight (lbs) 162 lb 1.6 oz 169 lb 12.1 oz 169 lb 12.1 oz  Weight (kg) 73.528 kg 77 kg 77 kg     Body mass index is 23.26 kg/m.  General:  Well nourished, well developed, in no acute distress HEENT: normal Neck: no JVD Vascular: No carotid bruits; Distal pulses 2+ bilaterally Cardiac:  irreg-irreg; 2/6 SM Lungs:  diminished at the bases,  no wheezing, rhonchi or rales  Abd: soft, nontender Ext: trace if any edema Musculoskeletal:  No deformities Skin: warm and dry  Neuro:  no focal abnormalities noted Psych:  Normal affect   EKG:  The EKG was personally reviewed and demonstrates:    AFib 96bpm, RBBB, LAD/LAF block  Telemetry:  Telemetry was personally reviewed and demonstrates:   AFib 90's-110's, has had  2 episodes of RVR w/aberrancy, both fairlyl  brief in duration, several minutes, though not hours  Relevant CV Studies:  Updated echo is done, pending read  10/30/23: TTE 1. Left ventricular ejection fraction, by estimation, is 25 to 30%. Left  ventricular ejection fraction by PLAX is 26 %. The left ventricle has  severely decreased function. The left ventricle demonstrates global  hypokinesis. The left ventricular internal   cavity size was moderately to severely dilated. Left ventricular  diastolic parameters are indeterminate. The average left ventricular  global longitudinal strain is -5.5 %. The global longitudinal strain is  abnormal.   2. Right ventricular systolic function is moderately reduced. The right  ventricular size is mildly enlarged. There is moderately elevated  pulmonary artery systolic pressure. The estimated right ventricular  systolic pressure is 55.4 mmHg.   3. Left atrial size was severely dilated.  (Measured 6.7cm)  4. Right atrial size was severely dilated.   5. The mitral valve is normal in structure. Moderate to severe mitral  valve regurgitation. No evidence of mitral stenosis.   6. Tricuspid valve regurgitation is moderate.   7. The aortic valve is normal in structure. Aortic valve regurgitation is  mild to moderate. No aortic stenosis is present. Aortic valve mean  gradient measures 8.0 mmHg.   8. There is borderline dilatation of the ascending aorta, measuring 39  mm.   9. The inferior vena cava is dilated in size with >50% respiratory  variability, suggesting right atrial pressure of 8 mmHg.   08/31/23: [facility] Conclusions: Mild to moderate, non-obstructive coronary artery disease, as detailed below. Mildly elevated left heart filling pressures. Moderately elevated right heart and pulmonary artery pressures. Low normal Fick cardiac output/index.   Recommendations: Escalate diuresis. Titrate GDMT as tolerated.  Consider repeat echocardiogram once volume status and GDMT has been optimized.   Consider advanced heart failure consultation if LVEF remains severely reduced and/or significant mitral regurgitation is still present.   Laboratory Data: High Sensitivity Troponin:   Recent Labs  Lab 08/27/24 1907 08/27/24 2059  TROPONINIHS 11 15     Chemistry Recent Labs  Lab 08/27/24 1907 08/27/24 2349 08/28/24 0204  NA 134* 137 140  K 5.5* 4.8 4.6  CL 98 100 97*  CO2 19* 20* 26  GLUCOSE 189* 137* 124*  BUN 16 17 18   CREATININE 1.49* 1.51* 1.54*  CALCIUM 9.2 9.2 9.4  MG 2.9*  --  2.5*  GFRNONAA 48* 47* 46*  ANIONGAP 17* 17* 17*    Recent Labs  Lab 08/28/24 0204  PROT 7.5  ALBUMIN 4.2  AST 18  ALT 19  ALKPHOS 69  BILITOT 2.4*   Lipids No results for input(s): CHOL, TRIG, HDL, LABVLDL, LDLCALC, CHOLHDL in the last 168 hours.  Hematology Recent Labs  Lab 08/27/24 1907 08/28/24 0204  WBC 8.2 11.6*  RBC 4.78 4.76  HGB 15.9 15.6  HCT 47.4 44.9  MCV 99.2 94.3  MCH 33.3 32.8  MCHC 33.5 34.7  RDW 13.1 13.2  PLT 121* 140*   Thyroid   Recent Labs  Lab 08/27/24 2349  TSH 1.878  BNP Recent Labs  Lab 08/27/24 1907 08/28/24 0204  BNP 1,633.9* 1,952.5*    DDimer No results for input(s): DDIMER in the last 168 hours.  Radiology/Studies:  DG Chest Port 1 View Result Date: 08/27/2024 EXAM: 1 VIEW(S) XRAY OF THE CHEST 08/27/2024 07:37:00 PM COMPARISON: 12/22/2021 CLINICAL HISTORY: sob FINDINGS: LUNGS AND PLEURA: Pulmonary vascular congestion. No focal consolidation. No pleural effusion. No pneumothorax. HEART AND MEDIASTINUM: Cardiomegaly. Atherosclerotic calcifications of the aorta. BONES AND SOFT TISSUES: Surgical clips in right supraclavicular region. No acute osseous abnormality. IMPRESSION: 1. Cardiomegaly and pulmonary vascular congestion. Electronically signed by: Norman Gatlin MD 08/27/2024 07:52 PM EST RP Workstation: [id number]     Assessment and Plan:  Permanent AFib CHA2DS2Vasc is 4, on Eliquis  WCT This is rapid AFib  w/aberrancy, not felt to be VT  Current rate control (home meds) Dig 0.125 QD Coreg  25mg  BID With what looks like by prior EKGs generally well controlled rates  Dr. Almetta has been bedside, revisited ICD, ration for it for him, he is familiar, has spoke to other providers, has a family member with an CID as well. He is not ready to get on board with a device Would refer back to EP out patient should he decide to pant to pursue implant   questions or updates, please contact [facility] Please consult [email or web address] for contact info under   Signed, Charlies Macario Arthur, PA-C  08/28/2024 2:28 PM

## 2024-08-28 NOTE — Significant Event (Signed)
 Rapid Response Event Note   Reason for Call :  HR 150-200s  5mg  Lopressor  IV Vagal maneuvers  Initial Focused Assessment:  Patient is short of breath he is belly breathing.  He also is sitting up frequently because he feels short of breath.  Lung sounds with crackles through out.  Heart tones irregular  BP 120/88  HR 110-120  RR 32  O2 sat 95% on RA  Interventions:  12 lead EKG done Placed on Bipap per RT  Scheduled AM med given: 25mg  Coreg  PO 0.125 Digoxin  PO 40mg  Lasix  IV  Plan of Care:     Event Summary:   MD Notified: Dr Carlota came to bedside Call Time: 0851 Arrival Time: 0855 End Time: 0930  Elvin Portland, RN

## 2024-08-28 NOTE — Progress Notes (Signed)
 Heart Failure Navigator Progress Note  Assessed for Heart & Vascular TOC clinic readiness.  Patient does not meet criteria due to he is an Advanced Heart Failure Team patient of Dr. Zenaida. .   Navigator will sign off at this time.   Stephane Haddock, BSN, Scientist, clinical (histocompatibility and immunogenetics) Only

## 2024-08-28 DEATH — deceased

## 2024-08-29 ENCOUNTER — Other Ambulatory Visit: Payer: Self-pay

## 2024-08-29 ENCOUNTER — Inpatient Hospital Stay (HOSPITAL_COMMUNITY)

## 2024-08-29 DIAGNOSIS — I4821 Permanent atrial fibrillation: Secondary | ICD-10-CM | POA: Diagnosis not present

## 2024-08-29 DIAGNOSIS — I340 Nonrheumatic mitral (valve) insufficiency: Secondary | ICD-10-CM | POA: Diagnosis not present

## 2024-08-29 DIAGNOSIS — I5023 Acute on chronic systolic (congestive) heart failure: Secondary | ICD-10-CM | POA: Diagnosis not present

## 2024-08-29 DIAGNOSIS — I517 Cardiomegaly: Secondary | ICD-10-CM | POA: Diagnosis not present

## 2024-08-29 DIAGNOSIS — Z452 Encounter for adjustment and management of vascular access device: Secondary | ICD-10-CM | POA: Diagnosis not present

## 2024-08-29 DIAGNOSIS — I700 Atherosclerosis of aorta: Secondary | ICD-10-CM | POA: Diagnosis not present

## 2024-08-29 LAB — CBC WITH DIFFERENTIAL/PLATELET
Abs Immature Granulocytes: 0.04 K/uL (ref 0.00–0.07)
Basophils Absolute: 0.1 K/uL (ref 0.0–0.1)
Basophils Relative: 1 %
Eosinophils Absolute: 0.1 K/uL (ref 0.0–0.5)
Eosinophils Relative: 1 %
HCT: 44.1 % (ref 39.0–52.0)
Hemoglobin: 15.6 g/dL (ref 13.0–17.0)
Immature Granulocytes: 0 %
Lymphocytes Relative: 16 %
Lymphs Abs: 1.5 K/uL (ref 0.7–4.0)
MCH: 32.8 pg (ref 26.0–34.0)
MCHC: 35.4 g/dL (ref 30.0–36.0)
MCV: 92.8 fL (ref 80.0–100.0)
Monocytes Absolute: 1.1 K/uL — ABNORMAL HIGH (ref 0.1–1.0)
Monocytes Relative: 12 %
Neutro Abs: 6.7 K/uL (ref 1.7–7.7)
Neutrophils Relative %: 70 %
Platelets: 113 K/uL — ABNORMAL LOW (ref 150–400)
RBC: 4.75 MIL/uL (ref 4.22–5.81)
RDW: 13.3 % (ref 11.5–15.5)
WBC: 9.5 K/uL (ref 4.0–10.5)
nRBC: 0 % (ref 0.0–0.2)

## 2024-08-29 LAB — COOXEMETRY PANEL
Carboxyhemoglobin: 1.3 % (ref 0.5–1.5)
Methemoglobin: 0.8 % (ref 0.0–1.5)
O2 Saturation: 56.6 %
Total hemoglobin: 16.2 g/dL — ABNORMAL HIGH (ref 12.0–16.0)

## 2024-08-29 LAB — COMPREHENSIVE METABOLIC PANEL WITH GFR
ALT: 20 U/L (ref 0–44)
AST: 24 U/L (ref 15–41)
Albumin: 4 g/dL (ref 3.5–5.0)
Alkaline Phosphatase: 59 U/L (ref 38–126)
Anion gap: 9 (ref 5–15)
BUN: 35 mg/dL — ABNORMAL HIGH (ref 8–23)
CO2: 32 mmol/L (ref 22–32)
Calcium: 8.8 mg/dL — ABNORMAL LOW (ref 8.9–10.3)
Chloride: 93 mmol/L — ABNORMAL LOW (ref 98–111)
Creatinine, Ser: 1.94 mg/dL — ABNORMAL HIGH (ref 0.61–1.24)
GFR, Estimated: 35 mL/min — ABNORMAL LOW (ref >60)
Glucose, Bld: 145 mg/dL — ABNORMAL HIGH (ref 70–99)
Potassium: 3.5 mmol/L (ref 3.5–5.1)
Sodium: 134 mmol/L — ABNORMAL LOW (ref 135–145)
Total Bilirubin: 1.8 mg/dL — ABNORMAL HIGH (ref 0.0–1.2)
Total Protein: 7.3 g/dL (ref 6.5–8.1)

## 2024-08-29 LAB — LACTIC ACID, PLASMA
Lactic Acid, Venous: 1.2 mmol/L (ref 0.5–1.9)
Lactic Acid, Venous: 1.3 mmol/L (ref 0.5–1.9)

## 2024-08-29 MED ORDER — CHLORHEXIDINE GLUCONATE CLOTH 2 % EX PADS
6.0000 | MEDICATED_PAD | Freq: Every day | CUTANEOUS | Status: DC
  Administered 2024-08-30 – 2024-09-04 (×6): 6 via TOPICAL

## 2024-08-29 MED ORDER — FUROSEMIDE 10 MG/ML IJ SOLN
60.0000 mg | INTRAMUSCULAR | Status: AC
  Administered 2024-08-29: 60 mg via INTRAVENOUS
  Filled 2024-08-29: qty 6

## 2024-08-29 MED ORDER — CARVEDILOL 12.5 MG PO TABS: 12.5000 mg | ORAL_TABLET | Freq: Two times a day (BID) | ORAL | Status: DC

## 2024-08-29 MED ORDER — FUROSEMIDE 10 MG/ML IJ SOLN
120.0000 mg | Freq: Two times a day (BID) | INTRAVENOUS | Status: DC
  Administered 2024-08-29 – 2024-09-01 (×6): 120 mg via INTRAVENOUS
  Filled 2024-08-29 (×2): qty 12
  Filled 2024-08-29 (×4): qty 10
  Filled 2024-08-29 (×2): qty 12

## 2024-08-29 MED ORDER — SODIUM CHLORIDE 0.9% FLUSH: 10.0000 mL | INTRAVENOUS | Status: DC | PRN

## 2024-08-29 MED ORDER — SODIUM CHLORIDE 0.9% FLUSH
10.0000 mL | Freq: Two times a day (BID) | INTRAVENOUS | Status: DC
  Administered 2024-08-29 – 2024-09-04 (×11): 10 mL
  Administered 2024-09-04: 20 mL

## 2024-08-29 NOTE — Progress Notes (Signed)
 PROGRESS NOTE    [name] [name]  FMW:[id number] DOB: 1946/06/01 DOA: 08/27/2024 PCP: Cleatus Arlyss RAMAN, MD   Brief Narrative:  78 y.o. male with medical history significant for chronic biventricular systolic heart failure, paroxysmal atrial fibrillation chronically anticoagulated on Eliquis , essential hypertension, acquired hypothyroidism, who is admitted to [facility] on 08/27/2024 with acute on chronic systolic heart failure after presenting from home to [facility] ED complaining of shortness of breath of 2 to 3 days duration.  Associated with productive cough, lower extremity edema.  Of note patient was recently seen at urgent care for the above symptoms and there was some suspicion for underlying pneumonia.  He was discharged on prednisone , p.o. antibiotics Augmentin  and as needed albuterol  inhaler.  Patient symptoms did not improve hence EMS was summoned and he was found to have oxygen saturations of 87% on room air.     Patient was transported to the ER.  He was afebrile; in atrial fibrillation his initial heart rates were noted to be in the 160s, which is slightly improved into the range of the 90s to 120s following initiation of IV diuresis efforts; systolic blood pressures been in the 120s to 150s; respiratory rate 15-27; 93 to 100% on room air. Chest x-ray was concerning for cardiomegaly with increased pulmonary vascular congestion in the absence of infiltrate, pleural effusion and pneumothorax.  EKG showed atrial fibrillation with a heart rate of 96.  No ST-T wave changes noted.  The setting of acute on chronic systolic heart failure he was placed on BiPAP however he declined patient also received Lasix  60 mg,, Xopenex nebulizer x 1, Lopressor 5 mg IV x 1 dose.    Assessment & Plan:   Principal Problem:   Acute on chronic systolic heart failure (HCC) Active Problems:   Acquired hypothyroidism   SOB (shortness of breath)   Paroxysmal atrial fibrillation with RVR (HCC)   Hyperkalemia    Lactic acidosis   High anion gap metabolic acidosis   HLD (hyperlipidemia)  Assessment and Plan: Principal Problem:   Acute on chronic systolic heart failure (HCC) Active Problems:   Acquired hypothyroidism   SOB (shortness of breath)   Paroxysmal atrial fibrillation with RVR (HCC)   Hyperkalemia   Lactic acidosis   High anion gap metabolic acidosis   HLD (hyperlipidemia)   Acute on chronic systolic heart failure - Presented with dyspnea of 2 to 3 days duration. - Chest x-ray concerning for cardiomegaly and pulmonary vascular congestion. - Elevated BNP-1,633 ? 1,952  - Last echo reviewed which showed an EF of 25 to 30%. - Initially on 40 mg of Lasix  switched to 60 mg of Lasix  twice daily. - Cardiology consulted.  Repeat echo 08/28/2024 shows<20% EF. - Continue GDMT as Tolerated-started on Coreg  25 mg twice daily however that has been reduced to 12.5 mg p.o. twice daily.  Patient also on  digoxin  0.125 mg daily.  Lasix  increased to 120 IV twice daily. -Holding Jardiance , Entresto  24-26 mg BID, spironolactone  25 mg daily due to soft BP.  Cardiology titrating medications   Atrial fibrillation with RVR Had brief episode of RVR which resolved itself.  Cardiology concerned about sudden onset wide-complex tachycardia and VT, EP team consulted due to having reduced ejection fraction, per them, she does not have VT but instead she has A-fib.  EP discussed ICD indications with patient for primary prevention of SCD however patient not interested in this at this point in time. Continue beta-blockers, Eliquis  digoxin .  Cardiology managing.  AKI ruled out, mild renal insufficiency ruled in: Patient's creatinine at baseline appears to be around 1.3-1.4, peaked at 1.54, does not meet criteria for AKI.  Monitor closely.   Hypothyroidism Continue Synthroid . Follow-up TSH level.   Hyperlipidemia Continue pravastatin .    DVT prophylaxis: SCDs Start: 08/27/24 2138, Eliquis    Code Status: Full Code   Family Communication:  None present at bedside.  Plan of care discussed with patient in length and he/she verbalized understanding and agreed with it.  Status is: Inpatient Remains inpatient appropriate because: Needs IV diuresis   Estimated body mass index is 22.05 kg/m as calculated from the following:   Height as of this encounter: 5' 10 (1.778 m).   Weight as of this encounter: 69.7 kg.    Nutritional Assessment: Body mass index is 22.05 kg/m.[name] Seen by dietician.  I agree with the assessment and plan as outlined below: Nutrition Status:        . Skin Assessment: I have examined the patient's skin and I agree with the wound assessment as performed by the wound care RN as outlined below:    Consultants:  Cardiology  Procedures:  None  Antimicrobials:  Anti-infectives (From admission, onward)    None         Subjective: Patient seen and examined, sitting in the chair, has no complaints.  Denies shortness of breath or chest pain.  Objective: Vitals:   08/28/24 2351 08/29/24 0340 08/29/24 0732 08/29/24 0827  BP: 121/76 117/72 110/73   Pulse: (!) 119 (!) 31 68 (!) 122  Resp: 18 18 18    Temp: 99.8 F (37.7 C) 97.9 F (36.6 C) 98.6 F (37 C)   TempSrc: Oral Oral Oral   SpO2: 92% 93% 94%   Weight:  69.7 kg    Height:        Intake/Output Summary (Last 24 hours) at 08/29/2024 0859 Last data filed at 08/29/2024 0845 Gross per 24 hour  Intake 774 ml  Output 600 ml  Net 174 ml   Filed Weights   08/27/24 2230 08/27/24 2303 08/29/24 0340  Weight: 77 kg 73.5 kg 69.7 kg    Examination:  General exam: Appears calm and comfortable  Respiratory system: Very faint crackles at the bases. Cardiovascular system: S1 & S2 heard, RRR. No JVD, murmurs, rubs, gallops or clicks. No pedal edema. Gastrointestinal system: Abdomen is nondistended, soft and nontender. No organomegaly or masses felt. Normal bowel sounds heard. Central nervous system: Alert and oriented.  No focal neurological deficits. Extremities: Symmetric 5 x 5 power. Skin: No rashes, lesions or ulcers Psychiatry: Judgement and insight appear normal. Mood & affect appropriate.    Data Reviewed: I have personally reviewed following labs and imaging studies  CBC: Recent Labs  Lab 08/27/24 1907 08/28/24 0204  WBC 8.2 11.6*  NEUTROABS 7.2 9.9*  HGB 15.9 15.6  HCT 47.4 44.9  MCV 99.2 94.3  PLT 121* 140*   Basic Metabolic Panel: Recent Labs  Lab 08/27/24 1907 08/27/24 2349 08/28/24 0204  NA 134* 137 140  K 5.5* 4.8 4.6  CL 98 100 97*  CO2 19* 20* 26  GLUCOSE 189* 137* 124*  BUN 16 17 18   CREATININE 1.49* 1.51* 1.54*  CALCIUM 9.2 9.2 9.4  MG 2.9*  --  2.5*  PHOS  --   --  5.3*   GFR: Estimated Creatinine Clearance: 39 mL/min (A) (by C-G formula based on SCr of 1.54 mg/dL (H)). Liver Function Tests: Recent Labs  Lab 08/28/24 [id number]  AST 18  ALT 19  ALKPHOS 69  BILITOT 2.4*  PROT 7.5  ALBUMIN 4.2   No results for input(s): LIPASE, AMYLASE in the last 168 hours. No results for input(s): AMMONIA in the last 168 hours. Coagulation Profile: Recent Labs  Lab 08/28/24 0204  INR 1.1   Cardiac Enzymes: Recent Labs  Lab 08/27/24 2349  CKTOTAL 47*   BNP (last 3 results) No results for input(s): PROBNP in the last 8760 hours. HbA1C: No results for input(s): HGBA1C in the last 72 hours. CBG: No results for input(s): GLUCAP in the last 168 hours. Lipid Profile: No results for input(s): CHOL, HDL, LDLCALC, TRIG, CHOLHDL, LDLDIRECT in the last 72 hours. Thyroid  Function Tests: Recent Labs    08/27/24 2349  TSH 1.878   Anemia Panel: No results for input(s): VITAMINB12, FOLATE, FERRITIN, TIBC, IRON, RETICCTPCT in the last 72 hours. Sepsis Labs: Recent Labs  Lab 08/27/24 1938 08/27/24 2105 08/27/24 2349 08/28/24 0204  PROCALCITON  --   --  0.11  --   LATICACIDVEN 2.0* 2.0*  --  1.3    Recent Results (from the past  240 hours)  Resp panel by RT-PCR (RSV, Flu A&B, Covid) Anterior Nasal Swab     Status: None   Collection Time: 08/27/24  7:07 PM   Specimen: Anterior Nasal Swab  Result Value Ref Range Status   SARS Coronavirus 2 by RT PCR NEGATIVE NEGATIVE Final   Influenza A by PCR NEGATIVE NEGATIVE Final   Influenza B by PCR NEGATIVE NEGATIVE Final    Comment: (NOTE) The Xpert Xpress SARS-CoV-2/FLU/RSV plus assay is intended as an aid in the diagnosis of influenza from Nasopharyngeal swab specimens and should not be used as a sole basis for treatment. Nasal washings and aspirates are unacceptable for Xpert Xpress SARS-CoV-2/FLU/RSV testing.  Fact Sheet for Patients: bloggercourse.com  Fact Sheet for Healthcare Providers: seriousbroker.it  This test is not yet approved or cleared by the [place]  FDA and has been authorized for detection and/or diagnosis of SARS-CoV-2 by FDA under an Emergency Use Authorization (EUA). This EUA will remain in effect (meaning this test can be used) for the duration of the COVID-19 declaration under Section 564(b)(1) of the Act, 21 U.S.C. section 360bbb-3(b)(1), unless the authorization is terminated or revoked.     Resp Syncytial Virus by PCR NEGATIVE NEGATIVE Final    Comment: (NOTE) Fact Sheet for Patients: bloggercourse.com  Fact Sheet for Healthcare Providers: seriousbroker.it  This test is not yet approved or cleared by the [place]  FDA and has been authorized for detection and/or diagnosis of SARS-CoV-2 by FDA under an Emergency Use Authorization (EUA). This EUA will remain in effect (meaning this test can be used) for the duration of the COVID-19 declaration under Section 564(b)(1) of the Act, 21 U.S.C. section 360bbb-3(b)(1), unless the authorization is terminated or revoked.  Performed at [facility], [address],  [place], [place] [address]   Culture, blood (routine x 2)     Status: None (Preliminary result)   Collection Time: 08/27/24  7:15 PM   Specimen: BLOOD RIGHT HAND  Result Value Ref Range Status   Specimen Description BLOOD RIGHT HAND  Final   Special Requests   Final    BOTTLES DRAWN AEROBIC AND ANAEROBIC Blood Culture results may not be optimal due to an inadequate volume of blood received in culture bottles   Culture   Final    NO GROWTH 2 DAYS Performed at [facility], [address], [place], [place] [address]    Report Status PENDING  Incomplete  Culture, blood (routine x 2)     Status: None (Preliminary result)   Collection Time: 08/27/24  7:20 PM   Specimen: BLOOD RIGHT FOREARM  Result Value Ref Range Status   Specimen Description BLOOD RIGHT FOREARM  Final   Special Requests   Final    BOTTLES DRAWN AEROBIC AND ANAEROBIC Blood Culture adequate volume   Culture   Final    NO GROWTH 2 DAYS Performed at [facility], [address], [place], [place] [address]    Report Status PENDING  Incomplete     Radiology Studies: ECHOCARDIOGRAM COMPLETE Result Date: 08/28/2024    ECHOCARDIOGRAM REPORT   Patient Name:   EYOEL THROGMORTON Shall Date of Exam: 08/28/2024 Medical Rec #:  [id number]    Height:       70.0 in Accession #:    [id number]   Weight:       162.1 lb Date of Birth:  1946-06-28   BSA:          1.909 m Patient Age:    78 years     BP:           116/83 mmHg Patient Gender: M            HR:           92 bpm. Exam Location:  Inpatient Procedure: 2D Echo, Cardiac Doppler and Color Doppler (Both Spectral and Color            Flow Doppler were utilized during procedure). Indications:    CHF  History:        Patient has prior history of Echocardiogram examinations, most                 recent 11/16/2023. CHF, Arrythmias:Atrial Fibrillation,                 Signs/Symptoms:Shortness of Breath; Risk Factors:Dyslipidemia.  Sonographer:    Sherlean Dubin Referring Phys: [id number] JUSTIN B  HOWERTER IMPRESSIONS  1. Left ventricular ejection fraction, by estimation, is <20%. The left ventricle has severely decreased function. The left ventricle demonstrates global hypokinesis. The left ventricular internal cavity size was severely dilated. Left ventricular diastolic parameters are indeterminate.  2. Right ventricular systolic function was not well visualized. The right ventricular size is not well visualized.  3. Left atrial size was severely dilated.  4. Right atrial size was severely dilated.  5. A small pericardial effusion is present.  6. The mitral valve is abnormal. Not well visualized mitral valve regurgitation.  7. Tricuspid valve regurgitation not well visualized.  8. The aortic valve was not well visualized. There is mild calcification of the aortic valve. Aortic valve regurgitation is mild. Comparison(s): Changes from prior study are noted. LV dimension larger, EF worse compared to prior. Conclusion(s)/Recommendation(s): Technically challenging study. Valvular regurgitation not well visualized on current study. Noted to have prior severe mitral regurgitation. FINDINGS  Left Ventricle: Left ventricular ejection fraction, by estimation, is <20%. The left ventricle has severely decreased function. The left ventricle demonstrates global hypokinesis. The left ventricular internal cavity size was severely dilated. There is no left ventricular hypertrophy. Left ventricular diastolic parameters are indeterminate. Right Ventricle: The right ventricular size is not well visualized. Right vetricular wall thickness was not well visualized. Right ventricular systolic function was not well visualized. Left Atrium: Left atrial size was severely dilated. Right Atrium: Right atrial size was severely dilated.  Pericardium: A small pericardial effusion is present. Mitral Valve: The mitral valve is abnormal. Not well visualized mitral valve regurgitation. Tricuspid Valve: The tricuspid valve is not well  visualized. Tricuspid valve regurgitation not well visualized. Aortic Valve: The aortic valve was not well visualized. There is mild calcification of the aortic valve. Aortic valve regurgitation is mild. Pulmonic Valve: The pulmonic valve was not well visualized. Pulmonic valve regurgitation is trivial. No evidence of pulmonic stenosis. Aorta: The aortic root and ascending aorta are structurally normal, with no evidence of dilitation. IAS/Shunts: The interatrial septum was not well visualized.  LEFT VENTRICLE PLAX 2D LVIDd:         8.56 cm   Diastology LVIDs:         7.71 cm   LV e' medial:    6.64 cm/s LV PW:         0.49 cm   LV E/e' medial:  12.6 LV IVS:        0.80 cm   LV e' lateral:   12.20 cm/s LVOT diam:     2.00 cm   LV E/e' lateral: 6.9 LV SV:         36 LV SV Index:   19 LVOT Area:     3.14 cm  RIGHT VENTRICLE RV Basal diam:  3.50 cm  PULMONARY VEINS RV Mid diam:    2.80 cm  Diastolic Velocity: 22.10 cm/s                          S/D Velocity:       1.30                          Systolic Velocity:  27.70 cm/s LEFT ATRIUM             Index        RIGHT ATRIUM           Index LA diam:        4.90 cm 2.57 cm/m   RA Area:     28.00 cm LA Vol (A2C):   66.9 ml 35.04 ml/m  RA Volume:   82.10 ml  43.01 ml/m LA Vol (A4C):   80.0 ml 41.91 ml/m LA Biplane Vol: 77.0 ml 40.33 ml/m  AORTIC VALVE LVOT Vmax:   70.20 cm/s LVOT Vmean:  47.950 cm/s LVOT VTI:    0.116 m  AORTA Ao Root diam: 3.30 cm Ao Asc diam:  3.40 cm MITRAL VALVE MV Area (PHT): 6.32 cm    SHUNTS MV Decel Time: 120 msec    Systemic VTI:  0.12 m MV E velocity: 83.90 cm/s  Systemic Diam: 2.00 cm Shelda Bruckner MD Electronically signed by Shelda Bruckner MD Signature Date/Time: 08/28/2024/3:02:04 PM    Final    DG Chest Port 1 View Result Date: 08/27/2024 EXAM: 1 VIEW(S) XRAY OF THE CHEST 08/27/2024 07:37:00 PM COMPARISON: 12/22/2021 CLINICAL HISTORY: sob FINDINGS: LUNGS AND PLEURA: Pulmonary vascular congestion. No focal  consolidation. No pleural effusion. No pneumothorax. HEART AND MEDIASTINUM: Cardiomegaly. Atherosclerotic calcifications of the aorta. BONES AND SOFT TISSUES: Surgical clips in right supraclavicular region. No acute osseous abnormality. IMPRESSION: 1. Cardiomegaly and pulmonary vascular congestion. Electronically signed by: Norman Gatlin MD 08/27/2024 07:52 PM EST RP Workstation: [id number]    Scheduled Meds:  apixaban   5 mg Oral BID   carvedilol   12.5 mg Oral BID WC   digoxin   0.125 mg Oral Daily  furosemide   60 mg Intravenous Once   levothyroxine   137 mcg Oral Q0600   pravastatin   40 mg Oral Daily   Continuous Infusions:  furosemide        LOS: 1 day   Fredia Skeeter, MD [facility]  08/29/2024, 8:59 AM   *Please note that this is a verbal dictation therefore any spelling or grammatical errors are due to the Dragon Medical One system interpretation.  Please page via Amion and do not message via secure chat for urgent patient care matters. Secure chat can be used for non urgent patient care matters.  How to contact the [facility] Attending or Consulting provider 7A - 7P or covering provider during after hours 7P -7A, for this patient?  Check the care team in [facility] and look for a) attending/consulting [facility] provider listed and b) the [facility] team listed. Page or secure chat 7A-7P. Log into [email or web address] and use [facility]'s universal password to access. If you do not have the password, please contact the hospital operator. Locate the [facility] provider you are looking for under [facility] and page to a number that you can be directly reached. If you still have difficulty reaching the provider, please page the [facility] (Director on Call) for the Hospitalists listed on amion for assistance.

## 2024-08-29 NOTE — Consult Note (Signed)
 Advanced Heart Failure Team Consult Note   Primary Physician: Cleatus Arlyss RAMAN, MD Cardiologist:  [name], MD AHF: Dr. Zenaida   Reason for Consultation: acute on chronic systolic heart failure  HPI:    [name] [name] is seen today for evaluation of acute on chronic systolic heart failure w/ low output, at the request of Dr. Loni, Cardiology.   78 y/o male w/ chronic systolic heart failure w/ biventricular dysfunction d/t mixed ischemic/nonischemic CM, CAD, severe functional MR and permanent atrial fibrillation.   Echo 11/16/2023: LVEF 25 to 30%, moderate to severely dilated, mildly reduced RV function, severe biatrial dilation, moderate pulmonary hypertension, severe mitral regurgitation.   R/LHC 08/31/2023: LMCA with 20% distal stenosis. LAD with sequential 50% proximal and mid lesions. LCx with 30% mid stenosis, and proximal/mid RCA with 30% disease and 70% stenosis at the ostium of AM1. RA 11, RV 60/10, PA 60/28 (39), PCWP 23, LVEDP 20. Ao sat 95%, PA sat 61%. Fick CO/CI 4.7/2.4.    He is now admitted w/ a/c CHF. P/w NYHA Class IV symptoms. BNP 1952. Poor urinary response to high dose IV diuretics and worsening SCr concerning for low output HF. AHF team asked to assist w/ further management.   LA on admit 2.0>>2.0 but normalized to 1.3 yesterday. Repeat LA pending.   Echo done yesterday EF < 20%, global HK, RV not well visualized, severe BAE, mitral valve not well visualized. LV dimensions noted to be larger compared to prior study, LVIDs 7.71 cm (6.40 cm).    His Scr has increased from baseline of 1.3>>1.9 w/ attempts at diuresis.   He was also noted to have a WCT this admit but EKG/tele has been reviewed by EP and determined to be Afib.  However he also had a 17 beat run of VT this morning on tele. K 3.5, Mg 2.5.  He denies CP. Breathing has improved some since admission, no current resting dyspnea but c/w exertional dyspnea.   He has had some diarrhea. Being tested  for c-diff. No recent abx use.     Echo 08/28/24 1. Left ventricular ejection fraction, by estimation, is 25 to 30%. Left  ventricular ejection fraction by PLAX is 26 %. The left ventricle has  severely decreased function. The left ventricle demonstrates global  hypokinesis. The left ventricular internal   cavity size was moderately to severely dilated. Left ventricular  diastolic parameters are indeterminate. The average left ventricular  global longitudinal strain is -5.5 %. The global longitudinal strain is  abnormal.   2. Right ventricular systolic function is moderately reduced. The right  ventricular size is mildly enlarged. There is moderately elevated  pulmonary artery systolic pressure. The estimated right ventricular  systolic pressure is 55.4 mmHg.   3. Left atrial size was severely dilated.   4. Right atrial size was severely dilated.   5. The mitral valve is normal in structure. Moderate to severe mitral  valve regurgitation. No evidence of mitral stenosis.   6. Tricuspid valve regurgitation is moderate.   7. The aortic valve is normal in structure. Aortic valve regurgitation is  mild to moderate. No aortic stenosis is present. Aortic valve mean  gradient measures 8.0 mmHg.   8. There is borderline dilatation of the ascending aorta, measuring 39  mm.   9. The inferior vena cava is dilated in size with >50% respiratory  variability, suggesting right atrial pressure of 8 mmHg.     Home Medications Prior to Admission medications  Medication Sig Start Date End Date Taking? Authorizing Provider  albuterol  (VENTOLIN  HFA) 108 (90 Base) MCG/ACT inhaler Inhale 2 puffs into the lungs every 4 (four) hours as needed for wheezing or shortness of breath. 08/27/24  Yes Graham, Laura E, PA-C  amoxicillin -clavulanate (AUGMENTIN ) 875-125 MG tablet Take 1 tablet by mouth 2 (two) times daily.   Yes [provider]  apixaban  (ELIQUIS ) 5 MG TABS tablet Take 1 tablet by mouth  twice daily 08/16/24  Yes End, Lonni, MD  carvedilol  (COREG ) 25 MG tablet TAKE 1 TABLET BY MOUTH TWICE DAILY WITH A MEAL 06/12/24  Yes End, Lonni, MD  digoxin  (LANOXIN ) 0.125 MG tablet Take 0.125 mg by mouth daily.   Yes [provider]  doxazosin  (CARDURA ) 4 MG tablet Take 1 tablet (4 mg total) by mouth at bedtime. 01/06/24  Yes Cleatus Arlyss RAMAN, MD  empagliflozin  (JARDIANCE ) 10 MG TABS tablet Take 1 tablet (10 mg total) by mouth daily before breakfast. 10/04/23  Yes End, Lonni, MD  ENTRESTO  24-26 MG Take 1 tablet by mouth 2 (two) times daily. 12/12/18  Yes Cleatus Arlyss RAMAN, MD  fish oil-omega-3 fatty acids 1000 MG capsule Take 1 g by mouth daily.   Yes [provider]  fluticasone -salmeterol (ADVAIR) 250-50 MCG/ACT AEPB INHALE 1 DOSE BY MOUTH IN THE MORNING AND AT BEDTIME RINSE MOUTH AFTER USE 08/20/24  Yes Cleatus Arlyss RAMAN, MD  furosemide  (LASIX ) 20 MG tablet Take 1-2 tablets (20-40 mg total) by mouth daily. Patient taking differently: Take 40 mg by mouth 3 (three) times a week. 01/06/24  Yes Cleatus Arlyss RAMAN, MD  furosemide  (LASIX ) 40 MG tablet Take 40 mg by mouth daily. 05/08/24  Yes [provider]  levothyroxine  (SYNTHROID ) 137 MCG tablet Take 1 tablet (137 mcg total) by mouth daily before breakfast. 01/06/24  Yes Cleatus Arlyss RAMAN, MD  Melatonin 5 MG CAPS Take 5 mg by mouth daily.  12/18/14  Yes [provider]  Multiple Vitamin (MULTIVITAMIN) tablet Take 1 tablet by mouth daily.   Yes [provider]  pravastatin  (PRAVACHOL ) 40 MG tablet Take 1 tablet (40 mg total) by mouth daily. Patient taking differently: Take 40 mg by mouth at bedtime. 01/06/24  Yes Cleatus Arlyss RAMAN, MD  predniSONE  (DELTASONE ) 10 MG tablet Take 2 tablets (20 mg total) by mouth daily for 5 days. 08/27/24 09/01/24 Yes Graham, Laura E, PA-C  sildenafil  (VIAGRA ) 100 MG tablet Take 1 tablet (100 mg total) by mouth daily as needed for erectile dysfunction. 03/01/24  Yes Cleatus Arlyss RAMAN, MD  spironolactone  (ALDACTONE ) 25 MG tablet Take 1 tablet (25 mg total) by mouth daily. 01/06/24  Yes Cleatus Arlyss RAMAN, MD    Past Medical History: Past Medical History:  Diagnosis Date   Atrial fibrillation [facility])    CHF (congestive heart failure) (HCC)    Cirrhosis (HCC)    ED (erectile dysfunction)    Hyperglycemia    Hyperlipidemia    Hypertension    Internal hemorrhoids    Myocardial infarction (HCC) 2008   SCCA (squamous cell carcinoma) of skin    Left ear.  MOHS surgery 2014 [facility]   Skin cancer of face    2012 basal cell   Thyroid  disease     Past Surgical History: Past Surgical History:  Procedure Laterality Date   A-Fib Cardiomyopathy [facility])  10/2006   EF 25 - 35%   DOPPLER ECHOCARDIOGRAPHY  10/31/2006   EF 25-35% Mod. M.R., mild -mod TR   RIGHT/LEFT HEART  CATH AND CORONARY ANGIOGRAPHY Bilateral 08/31/2023   Procedure: RIGHT/LEFT HEART CATH AND CORONARY ANGIOGRAPHY;  Surgeon: Mady Bruckner, MD;  Location: [facility] INVASIVE CV LAB;  Service: Cardiovascular;  Laterality: Bilateral;    Family History: Family History  Problem Relation Age of Onset   Hypertension Mother    Cancer Mother        Bilat. breast dz, s/p mastectomy, Bilat   Colon cancer Mother        possible   Colon polyps Mother    Heart disease Father        CHF, S/P CABG (42) Pacer Incr BP   Hyperlipidemia Father    Heart disease Brother 48       CAD- stent and ICD/pacer   Prostate cancer Neg Hx     Social History: Social History   Socioeconomic History   Marital status: Married    Spouse name: Not on file   Number of children: 2   Years of education: Not on file   Highest education level: GED or equivalent  Occupational History   Occupation: Actor, Market Researcher business    Employer: RETIRED   Occupation: Airline Pilot, Tour Manager [employer]   Occupation: Semi-retired  Tobacco Use   Smoking status: Former    Current packs/day: 0.00    Average packs/day: 0.3  packs/day for 1 year (0.3 ttl pk-yrs)    Types: Cigarettes    Quit date: 02/26/1966    Years since quitting: 58.5   Smokeless tobacco: Never   Tobacco comments:    Less than six months  Vaping Use   Vaping status: Never Used  Substance and Sexual Activity   Alcohol use: Not Currently    Comment: 3-4 glasses of wine per week.   Drug use: Never   Sexual activity: Yes  Other Topics Concern   Not on file  Social History Narrative   Left handed   2 sons initially, 1 died Aug 05, 2009 from lymphoma, his other son is local   From [place], [place]   Prev worked at [email or web address] as of 2022-09-04   Enjoys working in the yard   Widowed 09/04/21, was married 1966-09-04 (First wife died of pancreatic cancer about 6 weeks after diagnosis in 09/04/2021.)   5 living grandkids (1 is deceased)   2 great grandkids      remarried 02/2023   Social Drivers of Corporate Investment Banker Strain: Low Risk  (01/04/2024)   Overall Financial Resource Strain (CARDIA)    Difficulty of Paying Living Expenses: Not hard at all  Food Insecurity: No Food Insecurity (08/28/2024)   Hunger Vital Sign    Worried About Running Out of Food in the Last Year: Never true    Ran Out of Food in the Last Year: Never true  Transportation Needs: No Transportation Needs (08/28/2024)   PRAPARE - Administrator, Civil Service (Medical): No    Lack of Transportation (Non-Medical): No  Physical Activity: Inactive (01/04/2024)   Exercise Vital Sign    Days of Exercise per Week: 0 days    Minutes of Exercise per Session: 0 min  Stress: No Stress Concern Present (01/04/2024)   [employer] of Occupational Health - Occupational Stress Questionnaire    Feeling of Stress : Not at all  Social Connections: Moderately Integrated (08/28/2024)   Social Connection and Isolation Panel    Frequency of Communication with Friends and Family: Three times a week    Frequency of Social Gatherings with Friends and Family:  Three times a week    Attends  Religious Services: 1 to 4 times per year    Active Member of Clubs or Organizations: No    Attends Banker Meetings: Never    Marital Status: Married    Allergies:  No Known Allergies  Objective:    Vital Signs:   Temp:  [97.9 F (36.6 C)-99.8 F (37.7 C)] 98.6 F (37 C) (12/02 0732) Pulse Rate:  [31-133] 122 (12/02 0827) Resp:  [17-19] 17 (12/02 0945) BP: (99-121)/(53-82) 102/66 (12/02 0945) SpO2:  [91 %-94 %] 94 % (12/02 0945) Weight:  [69.7 kg] 69.7 kg (12/02 0340) Last BM Date : 08/27/24  Weight change: Filed Weights   08/27/24 2230 08/27/24 09/12/2302 08/29/24 0340  Weight: 77 kg 73.5 kg 69.7 kg    Intake/Output:   Intake/Output Summary (Last 24 hours) at 08/29/2024 1427 Last data filed at 08/29/2024 1300 Gross per 24 hour  Intake 837 ml  Output 1100 ml  Net -263 ml      Physical Exam    General:  fatigued appearing. No resp difficulty HEENT: normal Neck: supple. JVP 12 cm.  Cor: Irregularly irregular rhythm and rate. 3/6 HSM at apex  Lungs: clear Abdomen: soft, nontender, mildly distended.. Extremities: no cyanosis, clubbing, rash, edema. Cool distal ext  Neuro: alert & orientedx3, cranial nerves grossly intact. moves all 4 extremities w/o difficulty. Affect pleasant   Telemetry   Afib 90s w/ frequent PVCs. Had 1 17 beat run of VT this morning.   EKG    Chronic afib 98 bpm, Nonspecific IVCD  Labs   Basic Metabolic Panel: Recent Labs  Lab 08/27/24 1907 08/27/24 2349 08/28/24 0204 08/29/24 0920  NA 134* 137 140 134*  K 5.5* 4.8 4.6 3.5  CL 98 100 97* 93*  CO2 19* 20* 26 32  GLUCOSE 189* 137* 124* 145*  BUN 16 17 18  35*  CREATININE 1.49* 1.51* 1.54* 1.94*  CALCIUM 9.2 9.2 9.4 8.8*  MG 2.9*  --  2.5*  --   PHOS  --   --  5.3*  --     Liver Function Tests: Recent Labs  Lab 08/28/24 0204 08/29/24 0920  AST 18 24  ALT 19 20  ALKPHOS 69 59  BILITOT 2.4* 1.8*  PROT 7.5 7.3  ALBUMIN 4.2 4.0   No results for input(s):  LIPASE, AMYLASE in the last 168 hours. No results for input(s): AMMONIA in the last 168 hours.  CBC: Recent Labs  Lab 08/27/24 1907 08/28/24 0204 08/29/24 0920  WBC 8.2 11.6* 9.5  NEUTROABS 7.2 9.9* 6.7  HGB 15.9 15.6 15.6  HCT 47.4 44.9 44.1  MCV 99.2 94.3 92.8  PLT 121* 140* 113*    Cardiac Enzymes: Recent Labs  Lab 08/27/24 2349  CKTOTAL 47*    BNP: BNP (last 3 results) Recent Labs    08/27/24 1907 08/28/24 0204  BNP 1,633.9* 1,952.5*    ProBNP (last 3 results) No results for input(s): PROBNP in the last 8760 hours.   CBG: No results for input(s): GLUCAP in the last 168 hours.  Coagulation Studies: Recent Labs    08/28/24 0204  LABPROT 15.1  INR 1.1     Imaging   No results found.   Medications:     Current Medications:  apixaban   5 mg Oral BID   carvedilol   12.5 mg Oral BID WC   digoxin   0.125 mg Oral Daily   levothyroxine   137 mcg Oral Q0600   pravastatin   40 mg Oral Daily    Infusions:  furosemide         Patient Profile   78 y/o male w/ chronic systolic heart failure w/ biventricular dysfunction d/t mixed ischemic/nonischemic CM, CAD, severe functional MR and permanent atrial fibrillation admitted w/ a/c CHF w/ NYHA Class IV symptoms. Now w/ suspected low output.   Assessment/Plan   1. Acute on Chronic Systolic Heart Failure  - mixed ischemic/idiopathic CM  - Echo 10/2023: LVEF 25 to 30%, moderate to severely dilated, mildly reduced RV function - NYHA IV on admit - Echo this admit EF <20%, RV not well visualized  - Sluggish urinary response despite high dose diuretics w/ rising SCr, concerning for low output. Initial LA was 2.0 on admit. Suspect will need inotropic support w/ milrinone to facilitate diuresis  - check repeat LA level  - continue IV Lasix  120 mg bid  - place PICC. Monitor co-ox and CVPs  - stop Coreg  w/ concern for low output - will hold digoxin  given elevated level on admit (0.7) + SCr close to 2.0   - concern he is nearing need for advanced therapies/LVAD. Plan RHC after diuresis  - he had run of VT this morning but has previously declined ICD   2. Chronic Afib  - V-rates 90s - will likely need IV amio if starting inotropes - continue Eliquis    3. Severe MR - functional  - LV cavity more dilated on this admission echo in setting of a/c CHF exacerbation, LVIDs 7.7 cm (6.40 cm) - HF optimization per above - can reassess by echo after diuresis. If still symptomatic and LVIDs < 7 cm, can consider mTEER  4. VT - in setting of worsening HF, 17 beat run tele - keep K > 4.0 and Mg > 2.0 - start IV amio  - he previously declined ICD but may reconsider  5. CAD - LHC 08/31/2023: LMCA with 20% distal stenosis. LAD with sequential 50% proximal and mid lesions. LCx with 30% mid stenosis, and proximal/mid RCA with 30% disease and 70% stenosis at the ostium of AM1  - denies CP  - continue statin - no ASA given need for Eliquis     Length of Stay: 1  Livy Ross, PA-C  08/29/2024, 2:27 PM    Advanced Heart Failure Team Pager [phone] (M-F; 7a - 5p)  Please contact [facility] for night-coverage after hours (4p -7a ) and weekends on [email or web address]

## 2024-08-29 NOTE — Progress Notes (Signed)
  Progress Note  Patient Name: [name] [name] Date of Encounter: 08/29/2024 [facility] Cardiologist: [name], MD   Interval Summary   Feels 60% better but did not make as much UOP yesterday, only 10% better since yesterday.   Vital Signs Vitals:   08/28/24 2351 08/29/24 0340 08/29/24 0732 08/29/24 0827  BP: 121/76 117/72 110/73   Pulse: (!) 119 (!) 31 68 (!) 122  Resp: 18 18 18    Temp: 99.8 F (37.7 C) 97.9 F (36.6 C) 98.6 F (37 C)   TempSrc: Oral Oral Oral   SpO2: 92% 93% 94%   Weight:  69.7 kg    Height:        Intake/Output Summary (Last 24 hours) at 08/29/2024 0851 Last data filed at 08/29/2024 0845 Gross per 24 hour  Intake 774 ml  Output 600 ml  Net 174 ml      08/29/2024    3:40 AM 08/27/2024   11:03 PM 08/27/2024   10:30 PM  Last 3 Weights  Weight (lbs) 153 lb 10.6 oz 162 lb 1.6 oz 169 lb 12.1 oz  Weight (kg) 69.7 kg 73.528 kg 77 kg      Telemetry/ECG  Permanent AF - Personally Reviewed  Physical Exam  GEN: No acute distress.   Neck: JVP to mid neck upright. Cardiac: RRR, no murmurs, rubs, or gallops.  Respiratory: Clear to auscultation bilaterally. GI: distended but not tense MS: No edema  Assessment & Plan  Acute on chronic systolic HF Felt to be mixed ICM/NICM -decrease coreg  with no evidence of VT, 12.5 mg BID - increase lasix  120 mg BID and monitor response.  -on digoxin  0.125 mg daily for AF and HF   WCT - EP reviewed, felt to be afib RVR.    Severe mitral valve regurgitation - MR appears to be moderate on repeat echo, functional due to LV dysfunction.    Permanent AF -eliquis  5mg  BID    For questions or updates, please contact [facility] Please consult [email or web address] for contact info under         Signed, Idalis Hoelting A Mersadez Linden, MD

## 2024-08-29 NOTE — Plan of Care (Signed)

## 2024-08-29 NOTE — Progress Notes (Signed)
 Peripherally Inserted Central Catheter Placement  The IV Nurse has discussed with the patient and/or persons authorized to consent for the patient, the purpose of this procedure and the potential benefits and risks involved with this procedure.  The benefits include less needle sticks, lab draws from the catheter, and the patient may be discharged home with the catheter. Risks include, but not limited to, infection, bleeding, blood clot (thrombus formation), and puncture of an artery; nerve damage and irregular heartbeat and possibility to perform a PICC exchange if needed/ordered by physician.  Alternatives to this procedure were also discussed.  Bard Power PICC patient education guide, fact sheet on infection prevention and patient information card has been provided to patient /or left at bedside.    PICC Placement Documentation  PICC Double Lumen 08/29/24 Right Basilic 41 cm 0 cm (Active)  Indication for Insertion or Continuance of Line Chronic illness with exacerbations (CF, Sickle Cell, etc.);Vasoactive infusions 08/29/24 2130  Exposed Catheter (cm) 0 cm 08/29/24 2130  Site Assessment Clean, Dry, Intact 08/29/24 2130  Lumen #1 Status Flushed;Saline locked;Blood return noted 08/29/24 2130  Lumen #2 Status Flushed;Saline locked;Blood return noted 08/29/24 2130  Dressing Type Transparent;Securing device 08/29/24 2130  Dressing Status Antimicrobial disc/dressing in place;Clean, Dry, Intact 08/29/24 2130  Line Care Connections checked and tightened 08/29/24 2130  Line Adjustment (NICU/IV Team Only) No 08/29/24 2130  Dressing Intervention New dressing;Adhesive placed at insertion site (IV team only) 08/29/24 2130  Dressing Change Due 09/05/24 08/29/24 2130       Breanna Mcdaniel Loraine 08/29/2024, 9:30 PM

## 2024-08-30 ENCOUNTER — Telehealth (HOSPITAL_COMMUNITY): Payer: Self-pay

## 2024-08-30 ENCOUNTER — Other Ambulatory Visit (HOSPITAL_COMMUNITY): Payer: Self-pay

## 2024-08-30 DIAGNOSIS — I5023 Acute on chronic systolic (congestive) heart failure: Secondary | ICD-10-CM | POA: Diagnosis not present

## 2024-08-30 LAB — CBC WITH DIFFERENTIAL/PLATELET
Abs Immature Granulocytes: 0.06 K/uL (ref 0.00–0.07)
Basophils Absolute: 0.1 K/uL (ref 0.0–0.1)
Basophils Relative: 1 %
Eosinophils Absolute: 0.2 K/uL (ref 0.0–0.5)
Eosinophils Relative: 2 %
HCT: 43.8 % (ref 39.0–52.0)
Hemoglobin: 15.4 g/dL (ref 13.0–17.0)
Immature Granulocytes: 1 %
Lymphocytes Relative: 17 %
Lymphs Abs: 2.1 K/uL (ref 0.7–4.0)
MCH: 32.9 pg (ref 26.0–34.0)
MCHC: 35.2 g/dL (ref 30.0–36.0)
MCV: 93.6 fL (ref 80.0–100.0)
Monocytes Absolute: 1.3 K/uL — ABNORMAL HIGH (ref 0.1–1.0)
Monocytes Relative: 11 %
Neutro Abs: 8.4 K/uL — ABNORMAL HIGH (ref 1.7–7.7)
Neutrophils Relative %: 68 %
Platelets: 120 K/uL — ABNORMAL LOW (ref 150–400)
RBC: 4.68 MIL/uL (ref 4.22–5.81)
RDW: 13.2 % (ref 11.5–15.5)
WBC: 12.1 K/uL — ABNORMAL HIGH (ref 4.0–10.5)
nRBC: 0 % (ref 0.0–0.2)

## 2024-08-30 LAB — COOXEMETRY PANEL
Carboxyhemoglobin: 0.8 % (ref 0.5–1.5)
Carboxyhemoglobin: 1.4 % (ref 0.5–1.5)
Carboxyhemoglobin: 1.5 % (ref 0.5–1.5)
Methemoglobin: 0.7 % (ref 0.0–1.5)
Methemoglobin: 0.8 % (ref 0.0–1.5)
Methemoglobin: 0.7 % (ref 0.0–1.5)
O2 Saturation: 49.1 %
O2 Saturation: 48.7 %
O2 Saturation: 56.6 %
Total hemoglobin: 16 g/dL (ref 12.0–16.0)
Total hemoglobin: 15.9 g/dL (ref 12.0–16.0)
Total hemoglobin: 16 g/dL (ref 12.0–16.0)

## 2024-08-30 LAB — COMPREHENSIVE METABOLIC PANEL WITH GFR
ALT: 19 U/L (ref 0–44)
AST: 23 U/L (ref 15–41)
Albumin: 4 g/dL (ref 3.5–5.0)
Alkaline Phosphatase: 56 U/L (ref 38–126)
Anion gap: 15 (ref 5–15)
BUN: 37 mg/dL — ABNORMAL HIGH (ref 8–23)
CO2: 34 mmol/L — ABNORMAL HIGH (ref 22–32)
Calcium: 8.7 mg/dL — ABNORMAL LOW (ref 8.9–10.3)
Chloride: 89 mmol/L — ABNORMAL LOW (ref 98–111)
Creatinine, Ser: 1.72 mg/dL — ABNORMAL HIGH (ref 0.61–1.24)
GFR, Estimated: 40 mL/min — ABNORMAL LOW (ref >60)
Glucose, Bld: 107 mg/dL — ABNORMAL HIGH (ref 70–99)
Potassium: 3.3 mmol/L — ABNORMAL LOW (ref 3.5–5.1)
Sodium: 138 mmol/L (ref 135–145)
Total Bilirubin: 2 mg/dL — ABNORMAL HIGH (ref 0.0–1.2)
Total Protein: 7 g/dL (ref 6.5–8.1)

## 2024-08-30 LAB — MAGNESIUM: Magnesium: 1.9 mg/dL (ref 1.7–2.4)

## 2024-08-30 MED ORDER — MAGNESIUM SULFATE 2 GM/50ML IV SOLN
2.0000 g | Freq: Once | INTRAVENOUS | Status: AC
  Administered 2024-08-30: 2 g via INTRAVENOUS
  Filled 2024-08-30: qty 50

## 2024-08-30 MED ORDER — FLUTICASONE FUROATE-VILANTEROL 200-25 MCG/ACT IN AEPB
1.0000 | INHALATION_SPRAY | Freq: Every day | RESPIRATORY_TRACT | Status: DC
  Administered 2024-08-30 – 2024-09-04 (×6): 1 via RESPIRATORY_TRACT
  Filled 2024-08-30: qty 28

## 2024-08-30 MED ORDER — AMIODARONE HCL IN DEXTROSE 360-4.14 MG/200ML-% IV SOLN
60.0000 mg/h | INTRAVENOUS | Status: DC
  Administered 2024-08-30 – 2024-09-03 (×10): 30 mg/h via INTRAVENOUS
  Administered 2024-09-04 – 2024-09-06 (×9): 60 mg/h via INTRAVENOUS
  Administered 2024-09-07: 30 mg/h via INTRAVENOUS
  Administered 2024-09-07: 60 mg/h via INTRAVENOUS
  Administered 2024-09-07 – 2024-09-11 (×9): 30 mg/h via INTRAVENOUS
  Filled 2024-08-30 (×31): qty 200

## 2024-08-30 MED ORDER — MILRINONE LACTATE IN DEXTROSE 20-5 MG/100ML-% IV SOLN
0.2500 ug/kg/min | INTRAVENOUS | Status: DC
  Administered 2024-08-30 – 2024-09-04 (×7): 0.25 ug/kg/min via INTRAVENOUS
  Filled 2024-08-30 (×7): qty 100

## 2024-08-30 MED ORDER — POTASSIUM CHLORIDE CRYS ER 20 MEQ PO TBCR
40.0000 meq | EXTENDED_RELEASE_TABLET | Freq: Once | ORAL | Status: AC
  Administered 2024-08-30: 40 meq via ORAL
  Filled 2024-08-30: qty 2

## 2024-08-30 MED ORDER — ACETAZOLAMIDE 250 MG PO TABS
500.0000 mg | ORAL_TABLET | Freq: Once | ORAL | Status: AC
  Administered 2024-08-30: 500 mg via ORAL
  Filled 2024-08-30: qty 2

## 2024-08-30 NOTE — Progress Notes (Addendum)
 Advanced Heart Failure Rounding Note  Cardiologist: [name], MD  AHF: Dr. Zenaida  Chief Complaint: A/c Systolic HF   Patient Profile   78 y/o male w/ chronic systolic heart failure w/ biventricular dysfunction d/t mixed ischemic/nonischemic CM, CAD, severe functional MR and permanent atrial fibrillation admitted w/ a/c CHF w/ NYHA Class IV symptoms. Now w/ suspected low output.   Subjective:    PICC placed. Co-ox 49% x 2. FICK CI 1.02. CVP reading low but he complains of orthopnea. Had a rough night. Had to elevated HOB.   Had 1.2L in UOP yesterday.  SCr 1.9>>1.7 K 3.3 CO2 34    Objective:   Weight Range: 70.5 kg Body mass index is 22.3 kg/m.   Vital Signs:   Temp:  [97.4 F (36.3 C)-98.8 F (37.1 C)] 97.4 F (36.3 C) (12/03 0412) Pulse Rate:  [59-122] 102 (12/03 0412) Resp:  [17-19] 17 (12/03 0412) BP: (102-126)/(64-89) 126/73 (12/03 0412) SpO2:  [93 %-96 %] 94 % (12/03 0412) Weight:  [70.5 kg] 70.5 kg (12/03 0412) Last BM Date : 08/27/24  Weight change: Filed Weights   08/27/24 2303 08/29/24 0340 08/30/24 0412  Weight: 73.5 kg 69.7 kg 70.5 kg    Intake/Output:   Intake/Output Summary (Last 24 hours) at 08/30/2024 0713 Last data filed at 08/30/2024 0419 Gross per 24 hour  Intake 839 ml  Output 1200 ml  Net -361 ml      Physical Exam    General:  fatigued appearing. No resp difficulty w/ HOB elevated.  Neck: JVP ~10 cm  Cor: Irregularly irregular rhythm and rate. 3/6 HSM at apex  Lungs: diminished at the bases bilaterally  Abdomen: Soft, nontender, nondistended.  Extremities: No cyanosis, clubbing, rash, edema + RUE PICC  Neuro: Alert & orientedx3, cranial nerves grossly intact. moves all 4 extremities w/o difficulty. Affect pleasant   Telemetry   Afib 110s, personally reviewed   EKG    N/A   Labs    CBC Recent Labs    08/29/24 0920 08/30/24 0648  WBC 9.5 12.1*  NEUTROABS 6.7 8.4*  HGB 15.6 15.4  HCT 44.1 43.8  MCV  92.8 93.6  PLT 113* 120*   Basic Metabolic Panel Recent Labs    88/69/74 1907 08/27/24 2349 08/28/24 0204 08/29/24 0920  NA 134*   < > 140 134*  K 5.5*   < > 4.6 3.5  CL 98   < > 97* 93*  CO2 19*   < > 26 32  GLUCOSE 189*   < > 124* 145*  BUN 16   < > 18 35*  CREATININE 1.49*   < > 1.54* 1.94*  CALCIUM 9.2   < > 9.4 8.8*  MG 2.9*  --  2.5*  --   PHOS  --   --  5.3*  --    < > = values in this interval not displayed.   Liver Function Tests Recent Labs    08/28/24 0204 08/29/24 0920  AST 18 24  ALT 19 20  ALKPHOS 69 59  BILITOT 2.4* 1.8*  PROT 7.5 7.3  ALBUMIN 4.2 4.0   No results for input(s): LIPASE, AMYLASE in the last 72 hours. Cardiac Enzymes Recent Labs    08/27/24 2349  CKTOTAL 47*    BNP: BNP (last 3 results) Recent Labs    08/27/24 1907 08/28/24 0204  BNP 1,633.9* 1,952.5*    ProBNP (last 3 results) No results for input(s): PROBNP in the last 8760 hours.  D-Dimer No results for input(s): DDIMER in the last 72 hours. Hemoglobin A1C No results for input(s): HGBA1C in the last 72 hours. Fasting Lipid Panel No results for input(s): CHOL, HDL, LDLCALC, TRIG, CHOLHDL, LDLDIRECT in the last 72 hours. Thyroid  Function Tests Recent Labs    08/27/24 2349  TSH 1.878    Other results:   Imaging    DG CHEST PORT 1 VIEW Result Date: 08/29/2024 EXAM: 1 VIEW(S) XRAY OF THE CHEST 08/29/2024 09:35:00 PM COMPARISON: 08/27/2024 CLINICAL HISTORY: S/P PICC central line placement FINDINGS: LINES, TUBES AND DEVICES: Right PICC in place with tip overlying superior cavoatrial junction. LUNGS AND PLEURA: No focal pulmonary opacity. No pleural effusion. No pneumothorax. HEART AND MEDIASTINUM: Cardiomegaly. Aortic atherosclerosis. BONES AND SOFT TISSUES: Surgical clips in right supraclavicular region. No acute osseous abnormality. IMPRESSION: 1. Right PICC in place with tip overlying superior cavoatrial junction. 2. Cardiomegaly.  Electronically signed by: Norman Gatlin MD 08/29/2024 09:39 PM EST RP Workstation: [id number]   US  EKG SITE RITE Result Date: 08/29/2024 If Site Rite image not attached, placement could not be confirmed due to current cardiac rhythm.    Medications:     Scheduled Medications:  apixaban   5 mg Oral BID   Chlorhexidine Gluconate Cloth  6 each Topical Daily   levothyroxine   137 mcg Oral Q0600   pravastatin   40 mg Oral Daily   sodium chloride  flush  10-40 mL Intracatheter Q12H    Infusions:  furosemide  Stopped (08/29/24 1932)    PRN Medications: acetaminophen  **OR** acetaminophen , melatonin, ondansetron  (ZOFRAN ) IV, sodium chloride  flush    Assessment/Plan   1. Acute on Chronic Systolic Heart Failure w/ Low Output  - mixed ischemic/idiopathic CM  - Echo 10/2023: LVEF 25 to 30%, moderate to severely dilated, mildly reduced RV function - NYHA IV on admit - Echo this admit EF <20%, RV not well visualized  - Co-ox 48-49% today. FICK CI 1.02. Start Milrinone 0.25 mcg/kg/min - CVP reading low but suspect high PCW. + orthopnea. Continue IV Lasix  120 mg bid + add Diamox 500 mg x 1 w/ aggressive K supp  - off Coreg  w/ low output - holding digoxin  given elevated level and AKI. Can restart later if Cr improves - concern he is nearing need for advanced therapies/LVAD. Plan RHC after diuresis  - he had run of VT this morning but has previously declined ICD    2. Chronic Afib  - V-rates 110s - start IV amio @ 30/hr w/ addition of milrinone  - continue Eliquis     3. Severe MR - functional  - LV cavity more dilated on this admission echo in setting of a/c CHF exacerbation, LVIDs 7.7 cm (6.40 cm) - HF optimization per above - can reassess by echo after diuresis. If still symptomatic and LVIDs < 7 cm, can consider mTEER   4. VT - in setting of worsening HF, 17 beat run tele 12/2 - keep K > 4.0 and Mg > 2.0 - start IV amio  - he previously declined ICD but may reconsider   5. CAD -  LHC 08/31/2023: LMCA with 20% distal stenosis. LAD with sequential 50% proximal and mid lesions. LCx with 30% mid stenosis, and proximal/mid RCA with 30% disease and 70% stenosis at the ostium of AM1  - denies CP  - continue statin - no ASA given need for Eliquis   6. Hypokalemia - K 3.3 - give K supp w/ diuresis, d/w pharmD    Length of Stay: 2  Brittainy Simmons,  PA-C  08/30/2024, 7:13 AM  Advanced Heart Failure Team Pager [phone] (M-F; 7a - 5p)  Please contact [facility] for night-coverage after hours (5p -7a ) and weekends on [email or web address]  Patient seen and examined with the above-signed Advanced Practice Provider and/or Housestaff. I personally reviewed laboratory data, imaging studies and relevant notes. I independently examined the patient and formulated the important aspects of the plan. I have edited the note to reflect any of my changes or salient points. I have personally discussed the plan with the patient and/or family.  Feels poorly.  Co-ox 49% CVP 12  Milrinone started. AF rate increased so amio added for rate control  General:  Sitting up in bed. No resp difficulty HEENT: normal Neck: supple. JVP to jaw  Cor: irregular rate & rhythm. No rubs, gallops or murmurs. Lungs: clear Abdomen: soft, nontender, nondistended.Good bowel sounds. Extremities: no cyanosis, clubbing, rash, 1+ edema Neuro: alert & orientedx3, cranial nerves grossly intact. moves all 4 extremities w/o difficulty. Affect pleasant  He has end-stage (Stage D) HF. I discussed options with him and his family. I think the best option is to consider VAD support if his renal function can tolerate. That said, I think the window for advanced therapies is closing and if we are going to consider we should proceed during this hospitalization. We discussed the work-up process and he and his family are wanting to proceed. Will have VAD team see today.   Continue optimization. Likely RHC on Friday.   Toribio Fuel, MD  1:14 PM

## 2024-08-30 NOTE — Telephone Encounter (Signed)
 Pharmacy Patient Advocate Encounter  Insurance verification completed.    The patient is insured through [employer]/ [employer]. Patient has Medicare and is not eligible for a copay card, but may be able to apply for patient assistance or Medicare RX Payment Plan (Patient Must reach out to their plan, if eligible for payment plan), if available.    Ran test claim for Eliquis  5mg  tablet and it is refill too soon until 10/25/24.   This test claim was processed through [facility]- copay amounts may vary at other pharmacies due to pharmacy/plan contracts, or as the patient moves through the different stages of their insurance plan.

## 2024-08-30 NOTE — Evaluation (Signed)
 Physical Therapy Evaluation Patient Details Name: [name] [name] MRN: [id number] DOB: Jun 16, 1946 Today's Date: 08/30/2024  History of Present Illness  78 yo male admitted 11/30 with SOB Pt with workup for PNA and acute CHF exacerbation  PMH CHF, a-fib on Eliquis , HTN, Hypothyroidism, MI,SCCA, cirrhosis  Clinical Impression  Prior to admittance, pt was independent with mobility and ADLs. Pt was able to ambulate without an AD and no physical assistance given; no LOB or signs of instability noted. Pt and pt's spouse requesting to mobilize in room and in hallway. Discussed possibility with RN and am deferring decision to her. PT will continue to treat pt while he is admitted. No follow-up therapies recommended at this time.        If plan is discharge home, recommend the following: Assist for transportation;Help with stairs or ramp for entrance   Can travel by private vehicle        Equipment Recommendations None recommended by PT  Recommendations for Other Services       Functional Status Assessment Patient has had a recent decline in their functional status and demonstrates the ability to make significant improvements in function in a reasonable and predictable amount of time.     Precautions / Restrictions Precautions Precautions: Fall Recall of Precautions/Restrictions: Intact Restrictions Weight Bearing Restrictions Per Provider Order: No      Mobility  Bed Mobility Overal bed mobility: Modified Independent             General bed mobility comments: pt receieved standing at EOB and returned to sitting EOB    Transfers Overall transfer level: Independent Equipment used: None               General transfer comment: Pt completed sit to stand from EOB w/out AD and no physical assistance. Pt demonstrates good control throughout transfer    Ambulation/Gait Ambulation/Gait assistance: Supervision Gait Distance (Feet): 40 Feet Assistive device: None Gait  Pattern/deviations: WFL(Within Functional Limits) Gait velocity: functional Gait velocity interpretation: 1.31 - 2.62 ft/sec, indicative of limited community ambulator   General Gait Details: Pt demonstrates reciprocal gait pattern and no signs of instability or LOB noted.  Stairs            Wheelchair Mobility     Tilt Bed    Modified Rankin (Stroke Patients Only)       Balance Overall balance assessment: Modified Independent                                           Pertinent Vitals/Pain Pain Assessment Pain Assessment: No/denies pain    Home Living Family/patient expects to be discharged to:: Private residence Living Arrangements: Spouse/significant other Available Help at Discharge: Family;Available 24 hours/day Type of Home: House Home Access: Stairs to enter Entrance Stairs-Rails: None Entrance Stairs-Number of Steps: 2   Home Layout: One level Home Equipment: None      Prior Function Prior Level of Function : Independent/Modified Independent;Driving (Pt does yard work most of the day)             Mobility Comments: independent ADLs Comments: independent     Extremity/Trunk Assessment   Upper Extremity Assessment Upper Extremity Assessment: Defer to OT evaluation    Lower Extremity Assessment Lower Extremity Assessment: Overall WFL for tasks assessed    Cervical / Trunk Assessment Cervical / Trunk Assessment: Normal  Communication   Communication  Communication: No apparent difficulties    Cognition Arousal: Alert Behavior During Therapy: WFL for tasks assessed/performed   PT - Cognitive impairments: No apparent impairments                         Following commands: Intact       Cueing Cueing Techniques: Verbal cues     General Comments General comments (skin integrity, edema, etc.): HR 114 at beginning of session and 104 at end of session.    Exercises     Assessment/Plan    PT Assessment  Patient needs continued PT services  PT Problem List Decreased mobility;Cardiopulmonary status limiting activity       PT Treatment Interventions Gait training;Stair training;Functional mobility training;Therapeutic activities;Therapeutic exercise;Balance training;Patient/family education    PT Goals (Current goals can be found in the Care Plan section)  Acute Rehab PT Goals Patient Stated Goal: to go home PT Goal Formulation: With patient/family Time For Goal Achievement: 09/13/24 Potential to Achieve Goals: Good    Frequency Min 1X/week     Co-evaluation               AM-PAC PT 6 Clicks Mobility  Outcome Measure Help needed turning from your back to your side while in a flat bed without using bedrails?: None Help needed moving from lying on your back to sitting on the side of a flat bed without using bedrails?: None Help needed moving to and from a bed to a chair (including a wheelchair)?: None Help needed standing up from a chair using your arms (e.g., wheelchair or bedside chair)?: None Help needed to walk in hospital room?: A Little Help needed climbing 3-5 steps with a railing? : Total 6 Click Score: 20    End of Session   Activity Tolerance: Patient tolerated treatment well Patient left: in bed;with call bell/phone within reach;with family/visitor present Nurse Communication: Mobility status (Pt's spouse requesting to be able to walk with pt in the hallway; RN notified.) PT Visit Diagnosis: Other abnormalities of gait and mobility (R26.89)    Time: 8776-8759 PT Time Calculation (min) (ACUTE ONLY): 17 min   Charges:   PT Evaluation $PT Eval Moderate Complexity: 1 Mod   PT General Charges $$ ACUTE PT VISIT: 1 Visit         Leontine Hilt DPT Acute Rehab Services [phone] Prefer contact via chat   Leontine NOVAK Salena Ortlieb 08/30/2024, 12:52 PM

## 2024-08-30 NOTE — Progress Notes (Signed)
 PROGRESS NOTE    [name] [name]  FMW:[id number] DOB: December 24, 1945 DOA: 08/27/2024 PCP: [name] Arlyss RAMAN, MD   Brief Narrative:  78 y.o. male with medical history significant for chronic biventricular systolic heart failure, paroxysmal atrial fibrillation chronically anticoagulated on Eliquis , essential hypertension, acquired hypothyroidism, who is admitted to [facility] on 08/27/2024 with acute on chronic systolic heart failure after presenting from home to [facility] ED complaining of shortness of breath of 2 to 3 days duration.  Associated with productive cough, lower extremity edema.  Of note patient was recently seen at urgent care for the above symptoms and there was some suspicion for underlying pneumonia.  He was discharged on prednisone , p.o. antibiotics Augmentin  and as needed albuterol  inhaler.  Patient symptoms did not improve hence EMS was summoned and he was found to have oxygen saturations of 87% on room air.     Patient was transported to the ER.  He was afebrile; in atrial fibrillation his initial heart rates were noted to be in the 160s, which is slightly improved into the range of the 90s to 120s following initiation of IV diuresis efforts; systolic blood pressures been in the 120s to 150s; respiratory rate 15-27; 93 to 100% on room air. Chest x-ray was concerning for cardiomegaly with increased pulmonary vascular congestion in the absence of infiltrate, pleural effusion and pneumothorax.  EKG showed atrial fibrillation with a heart rate of 96.  No ST-T wave changes noted.  The setting of acute on chronic systolic heart failure he was placed on BiPAP however he declined patient also received Lasix  60 mg,, Xopenex nebulizer x 1, Lopressor 5 mg IV x 1 dose.    Assessment & Plan:   Principal Problem:   Acute on chronic systolic heart failure (HCC) Active Problems:   Acquired hypothyroidism   SOB (shortness of breath)   Paroxysmal atrial fibrillation with RVR (HCC)   Hyperkalemia    Lactic acidosis   High anion gap metabolic acidosis   HLD (hyperlipidemia)  Assessment and Plan: Principal Problem:   Acute on chronic systolic heart failure (HCC) Active Problems:   Acquired hypothyroidism   SOB (shortness of breath)   Paroxysmal atrial fibrillation with RVR (HCC)   Hyperkalemia   Lactic acidosis   High anion gap metabolic acidosis   HLD (hyperlipidemia)   Acute on chronic systolic heart failure - Presented with dyspnea of 2 to 3 days duration. - Chest x-ray concerning for cardiomegaly and pulmonary vascular congestion. - Elevated BNP-1,633 ? 1,952  - Last echo reviewed which showed an EF of 25 to 30%. Repeat echo 08/28/2024 shows<20% EF. Continue GDMT as Tolerated-started on Coreg  25 mg twice daily however that has been reduced to 12.5 mg p.o. twice daily.  Patient also on  digoxin  0.125 mg daily.  Lasix  increased to 120 IV twice daily. -Holding Jardiance , Entresto  24-26 mg BID, spironolactone  25 mg daily due to soft BP.  Cardiology titrating medications.  Patient has now been started on milrinone.   Atrial fibrillation with RVR Had brief episode of RVR which resolved itself.  Cardiology concerned about sudden onset wide-complex tachycardia and VT, EP team consulted due to having reduced ejection fraction, per them, she does not have VT but instead she has A-fib.  EP discussed ICD indications with patient for primary prevention of SCD however patient not interested in this at this point in time. Continue beta-blockers, Eliquis  digoxin .  Cardiology managing.   AKI on CKD stage IIIa ruled in:  Patient's creatinine at baseline  appears to be around 1.3-1.4, creatinine rose and peaked at 1.94 yesterday, now improved to 1.72 today.  Jardiance , Entresto  and Aldactone  are already on hold.  Patient on Lasix .  Will defer to cardiology for that.   Hypothyroidism Continue Synthroid . Follow-up TSH level.   Hyperlipidemia Continue pravastatin .   Hypokalemia: Will  replenish.  DVT prophylaxis: SCDs Start: 08/27/24 2138, Eliquis    Code Status: Full Code  Family Communication: Wife present at bedside.  Plan of care discussed with patient in length and he/she verbalized understanding and agreed with it.  Status is: Inpatient Remains inpatient appropriate because: Needs IV diuresis/now on milrinone drip   Estimated body mass index is 22.3 kg/m as calculated from the following:   Height as of this encounter: 5' 10 (1.778 m).   Weight as of this encounter: 70.5 kg.    Nutritional Assessment: Body mass index is 22.3 kg/m.[name] Seen by dietician.  I agree with the assessment and plan as outlined below: Nutrition Status:        . Skin Assessment: I have examined the patient's skin and I agree with the wound assessment as performed by the wound care RN as outlined below:    Consultants:  Cardiology  Procedures:  None  Antimicrobials:  Anti-infectives (From admission, onward)    None         Subjective: Patient seen and examined, wife at the bedside.  He says that he is not feeling well today.  He had significant shortness of breath with laying flat last night.  Feels better with sitting up.  He is currently sitting in his bed and feels slightly better.  No chest pain.  Objective: Vitals:   08/29/24 2006 08/30/24 0016 08/30/24 0412 08/30/24 0826  BP: 124/72 117/71 126/73 116/66  Pulse: 97 (!) 59 (!) 102 93  Resp: 17 18 17 18   Temp: 98.2 F (36.8 C) 98.5 F (36.9 C) (!) 97.4 F (36.3 C)   TempSrc: Oral Oral Oral   SpO2: 96% 96% 94% 95%  Weight:   70.5 kg   Height:        Intake/Output Summary (Last 24 hours) at 08/30/2024 0848 Last data filed at 08/30/2024 0419 Gross per 24 hour  Intake 719 ml  Output 1200 ml  Net -481 ml   Filed Weights   08/27/24 2303 08/29/24 0340 08/30/24 0412  Weight: 73.5 kg 69.7 kg 70.5 kg    Examination:  General exam: Appears calm and comfortable  Respiratory system: Very faint crackles at  the bases. Cardiovascular system: S1 & S2 heard, RRR. No JVD, murmurs, rubs, gallops or clicks. No pedal edema. Gastrointestinal system: Abdomen is nondistended, soft and nontender. No organomegaly or masses felt. Normal bowel sounds heard. Central nervous system: Alert and oriented. No focal neurological deficits. Extremities: Symmetric 5 x 5 power. Skin: No rashes, lesions or ulcers Psychiatry: Judgement and insight appear normal. Mood & affect appropriate.    Data Reviewed: I have personally reviewed following labs and imaging studies  CBC: Recent Labs  Lab 08/27/24 1907 08/28/24 0204 08/29/24 0920 08/30/24 0648  WBC 8.2 11.6* 9.5 12.1*  NEUTROABS 7.2 9.9* 6.7 8.4*  HGB 15.9 15.6 15.6 15.4  HCT 47.4 44.9 44.1 43.8  MCV 99.2 94.3 92.8 93.6  PLT 121* 140* 113* 120*   Basic Metabolic Panel: Recent Labs  Lab 08/27/24 1907 08/27/24 2349 08/28/24 0204 08/29/24 0920 08/30/24 0648 08/30/24 0753  NA 134* 137 140 134* 138  --   K 5.5* 4.8 4.6 3.5 3.3*  --  CL 98 100 97* 93* 89*  --   CO2 19* 20* 26 32 34*  --   GLUCOSE 189* 137* 124* 145* 107*  --   BUN 16 17 18  35* 37*  --   CREATININE 1.49* 1.51* 1.54* 1.94* 1.72*  --   CALCIUM 9.2 9.2 9.4 8.8* 8.7*  --   MG 2.9*  --  2.5*  --   --  1.9  PHOS  --   --  5.3*  --   --   --    GFR: Estimated Creatinine Clearance: 35.3 mL/min (A) (by C-G formula based on SCr of 1.72 mg/dL (H)). Liver Function Tests: Recent Labs  Lab 08/28/24 0204 08/29/24 0920 08/30/24 0648  AST 18 24 23   ALT 19 20 19   ALKPHOS 69 59 56  BILITOT 2.4* 1.8* 2.0*  PROT 7.5 7.3 7.0  ALBUMIN 4.2 4.0 4.0   No results for input(s): LIPASE, AMYLASE in the last 168 hours. No results for input(s): AMMONIA in the last 168 hours. Coagulation Profile: Recent Labs  Lab 08/28/24 0204  INR 1.1   Cardiac Enzymes: Recent Labs  Lab 08/27/24 2349  CKTOTAL 47*   BNP (last 3 results) No results for input(s): PROBNP in the last 8760  hours. HbA1C: No results for input(s): HGBA1C in the last 72 hours. CBG: No results for input(s): GLUCAP in the last 168 hours. Lipid Profile: No results for input(s): CHOL, HDL, LDLCALC, TRIG, CHOLHDL, LDLDIRECT in the last 72 hours. Thyroid  Function Tests: Recent Labs    08/27/24 2349  TSH 1.878   Anemia Panel: No results for input(s): VITAMINB12, FOLATE, FERRITIN, TIBC, IRON, RETICCTPCT in the last 72 hours. Sepsis Labs: Recent Labs  Lab 08/27/24 2105 08/27/24 2349 08/28/24 0204 08/29/24 1515 08/29/24 1826  PROCALCITON  --  0.11  --   --   --   LATICACIDVEN 2.0*  --  1.3 1.2 1.3    Recent Results (from the past 240 hours)  Resp panel by RT-PCR (RSV, Flu A&B, Covid) Anterior Nasal Swab     Status: None   Collection Time: 08/27/24  7:07 PM   Specimen: Anterior Nasal Swab  Result Value Ref Range Status   SARS Coronavirus 2 by RT PCR NEGATIVE NEGATIVE Final   Influenza A by PCR NEGATIVE NEGATIVE Final   Influenza B by PCR NEGATIVE NEGATIVE Final    Comment: (NOTE) The Xpert Xpress SARS-CoV-2/FLU/RSV plus assay is intended as an aid in the diagnosis of influenza from Nasopharyngeal swab specimens and should not be used as a sole basis for treatment. Nasal washings and aspirates are unacceptable for Xpert Xpress SARS-CoV-2/FLU/RSV testing.  Fact Sheet for Patients: bloggercourse.com  Fact Sheet for Healthcare Providers: seriousbroker.it  This test is not yet approved or cleared by the [place]  FDA and has been authorized for detection and/or diagnosis of SARS-CoV-2 by FDA under an Emergency Use Authorization (EUA). This EUA will remain in effect (meaning this test can be used) for the duration of the COVID-19 declaration under Section 564(b)(1) of the Act, 21 U.S.C. section 360bbb-3(b)(1), unless the authorization is terminated or revoked.     Resp Syncytial Virus by PCR NEGATIVE  NEGATIVE Final    Comment: (NOTE) Fact Sheet for Patients: bloggercourse.com  Fact Sheet for Healthcare Providers: seriousbroker.it  This test is not yet approved or cleared by the [place]  FDA and has been authorized for detection and/or diagnosis of SARS-CoV-2 by FDA under an Emergency Use Authorization (EUA). This EUA will remain in  effect (meaning this test can be used) for the duration of the COVID-19 declaration under Section 564(b)(1) of the Act, 21 U.S.C. section 360bbb-3(b)(1), unless the authorization is terminated or revoked.  Performed at [facility], [address] [place], [place] [address]   Culture, blood (routine x 2)     Status: None (Preliminary result)   Collection Time: 08/27/24  7:15 PM   Specimen: BLOOD RIGHT HAND  Result Value Ref Range Status   Specimen Description BLOOD RIGHT HAND  Final   Special Requests   Final    BOTTLES DRAWN AEROBIC AND ANAEROBIC Blood Culture results may not be optimal due to an inadequate volume of blood received in culture bottles   Culture   Final    NO GROWTH 3 DAYS Performed at [facility], [address], [place], [place] [address]    Report Status PENDING  Incomplete  Culture, blood (routine x 2)     Status: None (Preliminary result)   Collection Time: 08/27/24  7:20 PM   Specimen: BLOOD RIGHT FOREARM  Result Value Ref Range Status   Specimen Description BLOOD RIGHT FOREARM  Final   Special Requests   Final    BOTTLES DRAWN AEROBIC AND ANAEROBIC Blood Culture adequate volume   Culture   Final    NO GROWTH 3 DAYS Performed at [facility], [address], [place], [place] [address]    Report Status PENDING  Incomplete     Radiology Studies: DG CHEST PORT 1 VIEW Result Date: 08/29/2024 EXAM: 1 VIEW(S) XRAY OF THE CHEST 08/29/2024 09:35:00 PM COMPARISON: 08/27/2024 CLINICAL HISTORY: S/P PICC central line placement FINDINGS: LINES, TUBES AND  DEVICES: Right PICC in place with tip overlying superior cavoatrial junction. LUNGS AND PLEURA: No focal pulmonary opacity. No pleural effusion. No pneumothorax. HEART AND MEDIASTINUM: Cardiomegaly. Aortic atherosclerosis. BONES AND SOFT TISSUES: Surgical clips in right supraclavicular region. No acute osseous abnormality. IMPRESSION: 1. Right PICC in place with tip overlying superior cavoatrial junction. 2. Cardiomegaly. Electronically signed by: Norman Gatlin MD 08/29/2024 09:39 PM EST RP Workstation: [id number]   US  EKG SITE RITE Result Date: 08/29/2024 If Site Rite image not attached, placement could not be confirmed due to current cardiac rhythm.  ECHOCARDIOGRAM COMPLETE Result Date: 08/28/2024    ECHOCARDIOGRAM REPORT   Patient Name:   [name] [name] Eckersley Date of Exam: 08/28/2024 Medical Rec #:  [id number]    Height:       70.0 in Accession #:    [id number]   Weight:       162.1 lb Date of Birth:  09-13-1946   BSA:          1.909 m Patient Age:    78 years     BP:           116/83 mmHg Patient Gender: M            HR:           92 bpm. Exam Location:  Inpatient Procedure: 2D Echo, Cardiac Doppler and Color Doppler (Both Spectral and Color            Flow Doppler were utilized during procedure). Indications:    CHF  History:        Patient has prior history of Echocardiogram examinations, most                 recent 11/16/2023. CHF, Arrythmias:Atrial Fibrillation,  Signs/Symptoms:Shortness of Breath; Risk Factors:Dyslipidemia.  Sonographer:    Sherlean Dubin Referring Phys: [id number] JUSTIN B HOWERTER IMPRESSIONS  1. Left ventricular ejection fraction, by estimation, is <20%. The left ventricle has severely decreased function. The left ventricle demonstrates global hypokinesis. The left ventricular internal cavity size was severely dilated. Left ventricular diastolic parameters are indeterminate.  2. Right ventricular systolic function was not well visualized. The right ventricular size is not well  visualized.  3. Left atrial size was severely dilated.  4. Right atrial size was severely dilated.  5. A small pericardial effusion is present.  6. The mitral valve is abnormal. Not well visualized mitral valve regurgitation.  7. Tricuspid valve regurgitation not well visualized.  8. The aortic valve was not well visualized. There is mild calcification of the aortic valve. Aortic valve regurgitation is mild. Comparison(s): Changes from prior study are noted. LV dimension larger, EF worse compared to prior. Conclusion(s)/Recommendation(s): Technically challenging study. Valvular regurgitation not well visualized on current study. Noted to have prior severe mitral regurgitation. FINDINGS  Left Ventricle: Left ventricular ejection fraction, by estimation, is <20%. The left ventricle has severely decreased function. The left ventricle demonstrates global hypokinesis. The left ventricular internal cavity size was severely dilated. There is no left ventricular hypertrophy. Left ventricular diastolic parameters are indeterminate. Right Ventricle: The right ventricular size is not well visualized. Right vetricular wall thickness was not well visualized. Right ventricular systolic function was not well visualized. Left Atrium: Left atrial size was severely dilated. Right Atrium: Right atrial size was severely dilated. Pericardium: A small pericardial effusion is present. Mitral Valve: The mitral valve is abnormal. Not well visualized mitral valve regurgitation. Tricuspid Valve: The tricuspid valve is not well visualized. Tricuspid valve regurgitation not well visualized. Aortic Valve: The aortic valve was not well visualized. There is mild calcification of the aortic valve. Aortic valve regurgitation is mild. Pulmonic Valve: The pulmonic valve was not well visualized. Pulmonic valve regurgitation is trivial. No evidence of pulmonic stenosis. Aorta: The aortic root and ascending aorta are structurally normal, with no evidence  of dilitation. IAS/Shunts: The interatrial septum was not well visualized.  LEFT VENTRICLE PLAX 2D LVIDd:         8.56 cm   Diastology LVIDs:         7.71 cm   LV e' medial:    6.64 cm/s LV PW:         0.49 cm   LV E/e' medial:  12.6 LV IVS:        0.80 cm   LV e' lateral:   12.20 cm/s LVOT diam:     2.00 cm   LV E/e' lateral: 6.9 LV SV:         36 LV SV Index:   19 LVOT Area:     3.14 cm  RIGHT VENTRICLE RV Basal diam:  3.50 cm  PULMONARY VEINS RV Mid diam:    2.80 cm  Diastolic Velocity: 22.10 cm/s                          S/D Velocity:       1.30                          Systolic Velocity:  27.70 cm/s LEFT ATRIUM             Index        RIGHT ATRIUM  Index LA diam:        4.90 cm 2.57 cm/m   RA Area:     28.00 cm LA Vol (A2C):   66.9 ml 35.04 ml/m  RA Volume:   82.10 ml  43.01 ml/m LA Vol (A4C):   80.0 ml 41.91 ml/m LA Biplane Vol: 77.0 ml 40.33 ml/m  AORTIC VALVE LVOT Vmax:   70.20 cm/s LVOT Vmean:  47.950 cm/s LVOT VTI:    0.116 m  AORTA Ao Root diam: 3.30 cm Ao Asc diam:  3.40 cm MITRAL VALVE MV Area (PHT): 6.32 cm    SHUNTS MV Decel Time: 120 msec    Systemic VTI:  0.12 m MV E velocity: 83.90 cm/s  Systemic Diam: 2.00 cm Shelda Bruckner MD Electronically signed by Shelda Bruckner MD Signature Date/Time: 08/28/2024/3:02:04 PM    Final     Scheduled Meds:  apixaban   5 mg Oral BID   Chlorhexidine Gluconate Cloth  6 each Topical Daily   levothyroxine   137 mcg Oral Q0600   potassium chloride  40 mEq Oral Once   pravastatin   40 mg Oral Daily   sodium chloride  flush  10-40 mL Intracatheter Q12H   Continuous Infusions:  furosemide  120 mg (08/30/24 0832)   magnesium sulfate bolus IVPB       LOS: 2 days   Fredia Skeeter, MD [facility]  08/30/2024, 8:48 AM   *Please note that this is a verbal dictation therefore any spelling or grammatical errors are due to the Dragon Medical One system interpretation.  Please page via Amion and do not message via secure chat  for urgent patient care matters. Secure chat can be used for non urgent patient care matters.  How to contact the [facility] Attending or Consulting provider 7A - 7P or covering provider during after hours 7P -7A, for this patient?  Check the care team in [facility] and look for a) attending/consulting [facility] provider listed and b) the [facility] team listed. Page or secure chat 7A-7P. Log into [email or web address] and use [facility] universal password to access. If you do not have the password, please contact the hospital operator. Locate the [facility] provider you are looking for under [facility] and page to a number that you can be directly reached. If you still have difficulty reaching the provider, please page the [facility] (Director on Call) for the Hospitalists listed on amion for assistance.

## 2024-08-30 NOTE — Progress Notes (Signed)
 Brief MCS Note:         Initial VAD education provided to pt and his wife today at bedside.   VAD educational packet including Understanding Your Options with Advanced Heart Failure, [place] Patient Agreement for VAD Evaluation and Potential Implantation consent, and Abbott Heartmate 3 Left Ventricular Device (LVAD) Patient Guide, Heartmate 3 Left Ventricular Assist System Patient Education Program DVD, [place] HM III Patient Education, [place] Mechanical Circulatory Support Program, and Decision Aids for Left Ventricular Assist Device reviewed in detail and left at bedside for continued reference.   All questions answered regarding VAD implant, hospital stay, and what to expect when discharged home living with a heart pump.  Explained caregiver role to assist with adaptation to living on support, emotional support, consistent and meticulous exit site care and management, medication adherence and high volume of follow up visits with the VAD Clinic after discharge; both pt and caregiver verbalized understanding of above.   Evaluation progress explained to pt at length. Pt is interested in pursuing evaluation but wants to think about it overnight and discuss further with his wife. Pt expresses that at this time he feels that he would not be able to make it through the evaluation. Pt is hopeful to continue to get more fluid off and that his breathing will improve so he can lay flat to complete evaluation testing.   VAD Coordinator offered to have one of our current LVAD pt's come meet him and his wife. Pt declined. Encouraged to reconsider as it is very helpful for pt's in the decision making process.   All questions have been answered at this time. VAD Coordinator to revisit conversation tomorrow morning.  Schuyler Lunger RN, BSN VAD Coordinator 24/7 Pager [phone]

## 2024-08-30 NOTE — Evaluation (Signed)
 Occupational Therapy Evaluation and Discharge Patient Details Name: [name] [name] MRN: [id number] DOB: [birth year] Today's Date: 08/30/2024   History of Present Illness   78 yo male admitted 11/30 with SOB Pt with workup for PNA and acute CHF exacerbation  PMH CHF, a-fib on Eliquis , HTN, Hypothyroidism, MI,SCCA, cirrhosis     Clinical Impressions Pt is functioning modified independently in ADLs. Limited by activity tolerance. Reports he has learned to pace himself. Educated in energy conservation strategies and provided written handout to reinforce. No further OT needs.      If plan is discharge home, recommend the following:         Functional Status Assessment   Patient has not had a recent decline in their functional status     Equipment Recommendations   None recommended by OT     Recommendations for Other Services         Precautions/Restrictions   Restrictions Weight Bearing Restrictions Per Provider Order: No     Mobility Bed Mobility Overal bed mobility: Modified Independent                  Transfers Overall transfer level: Independent Equipment used: None                      Balance                                           ADL either performed or assessed with clinical judgement   ADL Overall ADL's : Modified independent                                       General ADL Comments: Educated in the 5 Ps of energy conservation and provided handout.     Vision Baseline Vision/History: 1 Wears glasses Ability to See in Adequate Light: 0 Adequate Patient Visual Report: No change from baseline       Perception         Praxis         Pertinent Vitals/Pain Pain Assessment Pain Assessment: No/denies pain     Extremity/Trunk Assessment Upper Extremity Assessment Upper Extremity Assessment: Overall WFL for tasks assessed   Lower Extremity Assessment Lower Extremity Assessment:  Defer to PT evaluation   Cervical / Trunk Assessment Cervical / Trunk Assessment: Normal   Communication Communication Communication: No apparent difficulties   Cognition Arousal: Alert Behavior During Therapy: WFL for tasks assessed/performed Cognition: No apparent impairments                               Following commands: Intact       Cueing  General Comments   Cueing Techniques: Verbal cues  HR 114 at beginning of session and 104 at end of session.   Exercises     Shoulder Instructions      Home Living Family/patient expects to be discharged to:: Private residence Living Arrangements: Spouse/significant other Available Help at Discharge: Family;Available 24 hours/day Type of Home: House Home Access: Stairs to enter [employer] of Steps: 2 Entrance Stairs-Rails: None Home Layout: One level     Bathroom Shower/Tub: Walk-in shower;Tub/shower unit   Teacher, Early Years/pre: Yes   Home Equipment: Information systems manager  Prior Functioning/Environment Prior Level of Function : Independent/Modified Independent;Driving             Mobility Comments: independent ADLs Comments: independent    OT Problem List:     OT Treatment/Interventions:        OT Goals(Current goals can be found in the care plan section)       OT Frequency:       Co-evaluation              AM-PAC OT 6 Clicks Daily Activity     Outcome Measure Help from another person eating meals?: None Help from another person taking care of personal grooming?: None Help from another person toileting, which includes using toliet, bedpan, or urinal?: None Help from another person bathing (including washing, rinsing, drying)?: None Help from another person to put on and taking off regular upper body clothing?: None Help from another person to put on and taking off regular lower body clothing?: None 6 Click Score: 24   End of  Session    Activity Tolerance: Patient tolerated treatment well Patient left: in bed;with call bell/phone within reach;with family/visitor present  OT Visit Diagnosis: Other (comment) (decreased activity tolerance)                Time: 1400-1413 OT Time Calculation (min): 13 min Charges:  OT General Charges $OT Visit: 1 Visit OT Evaluation $OT Eval Low Complexity: 1 Low  Mliss HERO, OTR/L Acute Rehabilitation Services Office: [phone]  Kennth Mliss Helling 08/30/2024, 2:32 PM

## 2024-08-31 ENCOUNTER — Inpatient Hospital Stay (HOSPITAL_COMMUNITY)

## 2024-08-31 DIAGNOSIS — R0602 Shortness of breath: Secondary | ICD-10-CM

## 2024-08-31 DIAGNOSIS — I5023 Acute on chronic systolic (congestive) heart failure: Secondary | ICD-10-CM | POA: Diagnosis not present

## 2024-08-31 LAB — CBC WITH DIFFERENTIAL/PLATELET
Abs Immature Granulocytes: 0.06 K/uL (ref 0.00–0.07)
Basophils Absolute: 0.1 K/uL (ref 0.0–0.1)
Basophils Relative: 1 %
Eosinophils Absolute: 0.3 K/uL (ref 0.0–0.5)
Eosinophils Relative: 3 %
HCT: 39.8 % (ref 39.0–52.0)
Hemoglobin: 14.3 g/dL (ref 13.0–17.0)
Immature Granulocytes: 1 %
Lymphocytes Relative: 16 %
Lymphs Abs: 1.7 K/uL (ref 0.7–4.0)
MCH: 32.9 pg (ref 26.0–34.0)
MCHC: 35.9 g/dL (ref 30.0–36.0)
MCV: 91.7 fL (ref 80.0–100.0)
Monocytes Absolute: 1.1 K/uL — ABNORMAL HIGH (ref 0.1–1.0)
Monocytes Relative: 11 %
Neutro Abs: 7.1 K/uL (ref 1.7–7.7)
Neutrophils Relative %: 68 %
Platelets: 111 K/uL — ABNORMAL LOW (ref 150–400)
RBC: 4.34 MIL/uL (ref 4.22–5.81)
RDW: 12.8 % (ref 11.5–15.5)
WBC: 10.2 K/uL (ref 4.0–10.5)
nRBC: 0 % (ref 0.0–0.2)

## 2024-08-31 LAB — ABO/RH: ABO/RH(D): A POS

## 2024-08-31 LAB — COMPREHENSIVE METABOLIC PANEL WITH GFR
ALT: 18 U/L (ref 0–44)
AST: 23 U/L (ref 15–41)
Albumin: 3.7 g/dL (ref 3.5–5.0)
Alkaline Phosphatase: 51 U/L (ref 38–126)
Anion gap: 8 (ref 5–15)
BUN: 50 mg/dL — ABNORMAL HIGH (ref 8–23)
CO2: 33 mmol/L — ABNORMAL HIGH (ref 22–32)
Calcium: 8.4 mg/dL — ABNORMAL LOW (ref 8.9–10.3)
Chloride: 92 mmol/L — ABNORMAL LOW (ref 98–111)
Creatinine, Ser: 1.88 mg/dL — ABNORMAL HIGH (ref 0.61–1.24)
GFR, Estimated: 36 mL/min — ABNORMAL LOW (ref >60)
Glucose, Bld: 127 mg/dL — ABNORMAL HIGH (ref 70–99)
Potassium: 3.1 mmol/L — ABNORMAL LOW (ref 3.5–5.1)
Sodium: 133 mmol/L — ABNORMAL LOW (ref 135–145)
Total Bilirubin: 1.5 mg/dL — ABNORMAL HIGH (ref 0.0–1.2)
Total Protein: 6.7 g/dL (ref 6.5–8.1)

## 2024-08-31 LAB — VAS US DOPPLER PRE VAD

## 2024-08-31 LAB — COOXEMETRY PANEL
Carboxyhemoglobin: 2.1 % — ABNORMAL HIGH (ref 0.5–1.5)
Methemoglobin: <0.7 % (ref 0.0–1.5)
O2 Saturation: 66.6 %
Total hemoglobin: 14.5 g/dL (ref 12.0–16.0)

## 2024-08-31 LAB — MAGNESIUM: Magnesium: 2.4 mg/dL (ref 1.7–2.4)

## 2024-08-31 MED ORDER — SODIUM CHLORIDE 0.9% FLUSH: 3.0000 mL | INTRAVENOUS | Status: DC | PRN

## 2024-08-31 MED ORDER — POTASSIUM CHLORIDE CRYS ER 20 MEQ PO TBCR: 40.0000 meq | EXTENDED_RELEASE_TABLET | ORAL | Status: DC

## 2024-08-31 MED ORDER — POTASSIUM CHLORIDE CRYS ER 20 MEQ PO TBCR
30.0000 meq | EXTENDED_RELEASE_TABLET | ORAL | Status: AC
  Administered 2024-08-31 (×3): 30 meq via ORAL
  Filled 2024-08-31 (×3): qty 1

## 2024-08-31 MED ORDER — SODIUM CHLORIDE 0.9% FLUSH
3.0000 mL | Freq: Two times a day (BID) | INTRAVENOUS | Status: DC
  Administered 2024-08-31: 3 mL via INTRAVENOUS

## 2024-08-31 MED ORDER — SODIUM CHLORIDE 0.9 % IV SOLN: 250.0000 mL | INTRAVENOUS | Status: DC | PRN

## 2024-08-31 NOTE — Plan of Care (Signed)
      Consult received. Chart reviewed. Attempted to see patient, but testing was in progress (LE doppler). Plan to follow-up tomorrow.    Recardo Loll, NP-C Palliative Medicine   Please call Palliative Medicine team phone with any questions [phone]. For individual providers please see AMION.   No charge

## 2024-08-31 NOTE — Progress Notes (Addendum)
 Advanced Heart Failure Rounding Note  Cardiologist: [name], MD  AHF: Dr. Zenaida  Chief Complaint: A/c Systolic HF   Patient Profile   78 y/o male w/ chronic systolic heart failure w/ biventricular dysfunction d/t mixed ischemic/nonischemic CM, CAD, severe functional MR and permanent atrial fibrillation admitted w/ a/c CHF w/ NYHA Class IV symptoms. Now w/ low output. Initial Co-ox 47%. Started on Milrinone .   Subjective:    Co-ox improved, up to 67% on 0.25 of milrinone .   I/Os incomplete d/t unmeasured urinary occurences. CVP down 7-8 today. Breathing has improved. Able to sleep more comfortably last night and less coughing.   SCr 1.94>>1.72>>1.88 (b/l 1.4) K 3.1    Objective:   Weight Range: 70.4 kg Body mass index is 22.27 kg/m.   Vital Signs:   Temp:  [97.4 F (36.3 C)-98.3 F (36.8 C)] 98 F (36.7 C) (12/04 0326) Pulse Rate:  [66-116] 89 (12/04 0326) Resp:  [18-20] 18 (12/04 0326) BP: (106-140)/(63-95) 128/67 (12/04 0326) SpO2:  [95 %-98 %] 96 % (12/04 0326) Weight:  [70.4 kg] 70.4 kg (12/04 0300) Last BM Date : 08/30/24  Weight change: Filed Weights   08/29/24 0340 08/30/24 0412 08/31/24 0300  Weight: 69.7 kg 70.5 kg 70.4 kg    Intake/Output:   Intake/Output Summary (Last 24 hours) at 08/31/2024 0715 Last data filed at 08/30/2024 2300 Gross per 24 hour  Intake 874.53 ml  Output 801 ml  Net 73.53 ml      Physical Exam  CVP 7-8  GENERAL: fatigued appearing, NAD Lungs- diminished at the bases bilaterally, faint rhonchi LLL  CARDIAC:  JVP: 8 cm         Irregularly irregular regular rhythm. 2/6 MR murmur at apex ABDOMEN: Soft, non-tender, non-distended.  EXTREMITIES: Warm and well perfused. No LEE  NEUROLOGIC: No obvious FND  Telemetry   Afib, 90s-low 100s personally reviewed   EKG    N/A   Labs    CBC Recent Labs    08/30/24 0648 08/31/24 0454  WBC 12.1* 10.2  NEUTROABS 8.4* 7.1  HGB 15.4 14.3  HCT 43.8 39.8  MCV 93.6  91.7  PLT 120* 111*   Basic Metabolic Panel Recent Labs    87/96/74 0648 08/30/24 0753 08/31/24 0454  NA 138  --  133*  K 3.3*  --  3.1*  CL 89*  --  92*  CO2 34*  --  33*  GLUCOSE 107*  --  127*  BUN 37*  --  50*  CREATININE 1.72*  --  1.88*  CALCIUM 8.7*  --  8.4*  MG  --  1.9 2.4   Liver Function Tests Recent Labs    08/30/24 0648 08/31/24 0454  AST 23 23  ALT 19 18  ALKPHOS 56 51  BILITOT 2.0* 1.5*  PROT 7.0 6.7  ALBUMIN 4.0 3.7   No results for input(s): LIPASE, AMYLASE in the last 72 hours. Cardiac Enzymes No results for input(s): CKTOTAL, CKMB, CKMBINDEX, TROPONINI in the last 72 hours.   BNP: BNP (last 3 results) Recent Labs    08/27/24 1907 08/28/24 0204  BNP 1,633.9* 1,952.5*    ProBNP (last 3 results) No results for input(s): PROBNP in the last 8760 hours.   D-Dimer No results for input(s): DDIMER in the last 72 hours. Hemoglobin A1C No results for input(s): HGBA1C in the last 72 hours. Fasting Lipid Panel No results for input(s): CHOL, HDL, LDLCALC, TRIG, CHOLHDL, LDLDIRECT in the last 72 hours. Thyroid  Function  Tests No results for input(s): TSH, T4TOTAL, T3FREE, THYROIDAB in the last 72 hours.  Invalid input(s): FREET3   Other results:   Imaging    No results found.    Medications:     Scheduled Medications:  apixaban   5 mg Oral BID   Chlorhexidine  Gluconate Cloth  6 each Topical Daily   fluticasone  furoate-vilanterol  1 puff Inhalation Daily   levothyroxine   137 mcg Oral Q0600   pravastatin   40 mg Oral Daily   sodium chloride  flush  10-40 mL Intracatheter Q12H    Infusions:  amiodarone  30 mg/hr (08/30/24 2011)   furosemide  120 mg (08/30/24 2016)   milrinone  0.25 mcg/kg/min (08/31/24 0146)    PRN Medications: acetaminophen  **OR** acetaminophen , melatonin, ondansetron  (ZOFRAN ) IV, sodium chloride  flush    Assessment/Plan   1. Acute on Chronic Systolic Heart Failure w/  Low Output  - mixed ischemic/idiopathic CM  - Echo 10/2023: LVEF 25 to 30%, moderate to severely dilated, mildly reduced RV function - NYHA IV on admit - Echo this admit EF <20%, RV not well visualized  - Started on Milrinone  for low co-ox (47%) - Remains on Milrinone  0.25. Co-ox improved, 67%.  - CVP 7-8 today, breathing improved  - off Coreg  w/ low output - holding digoxin  given elevated level and AKI. Can restart later if Cr improves - concern he is nearing need for advanced therapies/LVAD. Plan RHC after diuresis     2. Chronic Afib  - V-rates 90s-110s  - continue IV amio @ 30/hr while on milrinone   - continue Eliquis     3. Severe MR - functional  - LV cavity more dilated on this admission echo in setting of a/c CHF exacerbation, LVIDs 7.7 cm (6.40 cm) - HF optimization per above   4. VT - in setting of worsening HF, 17 beat run tele 12/2 - keep K > 4.0 and Mg > 2.0 - cont IV amio  - he previously declined ICD but may reconsider   5. CAD - LHC 08/31/2023: LMCA with 20% distal stenosis. LAD with sequential 50% proximal and mid lesions. LCx with 30% mid stenosis, and proximal/mid RCA with 30% disease and 70% stenosis at the ostium of AM1  - denies CP  - continue statin - no ASA given need for Eliquis   6. AKI - b/l SCr 1.4 - bumped to 1.9 w/ diuresis + low output - continue milrinone  + diuresis - follow BMP   7. Hypokalemia - K 3.1, Mg 2.4  - give K supp w/ diuresis, d/w pharmD  - add spiro if/when SCr stablizes    8. H/o Malignant Melanoma - occipital scalp, s/p excision @ [facility] in 2023 - never followed up for surveillance head CTs  - may need to revisit prior to potential VAD    Length of Stay: 3  Brittainy Marcine RIGGERS  08/31/2024, 7:15 AM  Advanced Heart Failure Team Pager [phone] (M-F; 7a - 5p)  Please contact [facility] for night-coverage after hours (5p -7a ) and weekends on [email or web address]    Patient seen and examined with the above-signed Advanced  Practice Provider and/or Housestaff. I personally reviewed laboratory data, imaging studies and relevant notes. I independently examined the patient and formulated the important aspects of the plan. I have edited the note to reflect any of my changes or salient points. I have personally discussed the plan with the patient and/or family.  Remains on milrinone . Co-ox improved. Has diuresed well on IV lasix . K low  Saw VAD team  yesterday and was hesitant to proceed with VAD workup   AF (chronic) rate now controlled on IV amio  General:  Sitting up in bed. No resp difficulty HEENT: normal Neck: supple. JVP 7.  Cor: irregular rate & rhythm. No rubs, gallops or murmurs. Lungs: clear Abdomen: soft, nontender, nondistended.Good bowel sounds. Extremities: no cyanosis, clubbing, rash, tr edema  He has stage D HF. Moderate improvement with milrinone. We have discussed VAD with him and he would be high risk with age and renal dysfunction but still may be a candidate if he is interested. Will need RHC today or tomorrow and CT scans/Dopplers if he wants to proceed with VAD w/u.   Toribio Fuel, MD  8:22 AM

## 2024-08-31 NOTE — TOC Progression Note (Signed)
 Transition of Care [facility]) - Progression Note    Patient Details  Name: [name] [name] MRN: [id number] Date of Birth: Dec 25, 1945  [facility] CM/SW Contact  Arlana JINNY Nicholaus [place] Phone Number: [phone] 08/31/2024, 11:43 AM  Clinical Narrative:   At this time patient is not medically ready for dc.   HF CSW/CM will continue to follow and monitor for dc readines.                       Expected Discharge Plan and Services                                               Social Drivers of Health (SDOH) Interventions SDOH Screenings   Food Insecurity: No Food Insecurity (08/28/2024)  Housing: Low Risk  (08/28/2024)  Transportation Needs: No Transportation Needs (08/28/2024)  Utilities: Not At Risk (08/28/2024)  Alcohol Screen: Low Risk  (01/04/2024)  Depression (PHQ2-9): Low Risk  (01/06/2024)  Financial Resource Strain: Low Risk  (01/04/2024)  Physical Activity: Inactive (01/04/2024)  Social Connections: Moderately Integrated (08/28/2024)  Stress: No Stress Concern Present (01/04/2024)  Tobacco Use: Medium Risk (08/27/2024)  Health Literacy: Adequate Health Literacy (01/04/2024)    Readmission Risk Interventions    08/28/2024    4:12 PM  Readmission Risk Prevention Plan  Transportation Screening Complete  PCP or Specialist Appt within 5-7 Days Complete  Home Care Screening Complete  Medication Review (RN CM) Complete

## 2024-08-31 NOTE — Progress Notes (Signed)
 PROGRESS NOTE    [name] [name]  FMW:[id number] DOB: Jan 05, 1946 DOA: 08/27/2024 PCP: Cleatus Arlyss RAMAN, MD   Brief Narrative:  78 y.o. male with medical history significant for chronic biventricular systolic heart failure, paroxysmal atrial fibrillation chronically anticoagulated on Eliquis , essential hypertension, acquired hypothyroidism, who is admitted to [facility] on 08/27/2024 with acute on chronic systolic heart failure after presenting from home to [facility] ED complaining of shortness of breath of 2 to 3 days duration.  Associated with productive cough, lower extremity edema.  Of note patient was recently seen at urgent care for the above symptoms and there was some suspicion for underlying pneumonia.  He was discharged on prednisone , p.o. antibiotics Augmentin  and as needed albuterol  inhaler.  Patient symptoms did not improve hence EMS was summoned and he was found to have oxygen saturations of 87% on room air.     Patient was transported to the ER.  He was afebrile; in atrial fibrillation his initial heart rates were noted to be in the 160s, which is slightly improved into the range of the 90s to 120s following initiation of IV diuresis efforts; systolic blood pressures been in the 120s to 150s; respiratory rate 15-27; 93 to 100% on room air. Chest x-ray was concerning for cardiomegaly with increased pulmonary vascular congestion in the absence of infiltrate, pleural effusion and pneumothorax.  EKG showed atrial fibrillation with a heart rate of 96.  No ST-T wave changes noted.  The setting of acute on chronic systolic heart failure he was placed on BiPAP however he declined patient also received Lasix  60 mg,, Xopenex  nebulizer x 1, Lopressor  5 mg IV x 1 dose.    Assessment & Plan:   Principal Problem:   Acute on chronic systolic heart failure (HCC) Active Problems:   Acquired hypothyroidism   SOB (shortness of breath)   Paroxysmal atrial fibrillation with RVR (HCC)   Hyperkalemia    Lactic acidosis   High anion gap metabolic acidosis   HLD (hyperlipidemia)  Assessment and Plan: Principal Problem:   Acute on chronic systolic heart failure (HCC) Active Problems:   Acquired hypothyroidism   SOB (shortness of breath)   Paroxysmal atrial fibrillation with RVR (HCC)   Hyperkalemia   Lactic acidosis   High anion gap metabolic acidosis   HLD (hyperlipidemia)   Acute on chronic systolic heart failure - Presented with dyspnea of 2 to 3 days duration. - Chest x-ray concerning for cardiomegaly and pulmonary vascular congestion. - Elevated BNP-1,633 ? 1,952  - Last echo reviewed which showed an EF of 25 to 30%. Repeat echo 08/28/2024 shows<20% EF. Continue GDMT as Tolerated-started on Coreg  25 mg twice daily however that has been reduced to 12.5 mg p.o. twice daily.  Patient also on  digoxin  0.125 mg daily.  Lasix  increased to 120 IV twice daily. -Holding Jardiance , Entresto  24-26 mg BID, spironolactone  25 mg daily due to soft BP.  Cardiology titrating medications.  Patient has now been started on milrinone  and co-ox is improving.  Plan for right heart cath after diuresis.    Atrial fibrillation with RVR Had brief episode of RVR which resolved itself.  Cardiology concerned about sudden onset wide-complex tachycardia and VT, EP team consulted due to having reduced ejection fraction, per them, she does not have VT but instead she has A-fib.  EP discussed ICD indications with patient for primary prevention of SCD however patient not interested in this at this point in time.  Digoxin  on hold due to elevated levels,  Coreg  on hold due to low blood pressure, patient currently on IV amiodarone .  Cardiology managing.   AKI on CKD stage IIIa ruled in:  Patient's creatinine at baseline appears to be around 1.3-1.4, creatinine rose and peaked at 1.94 on 12-25, slightly improved at 1.88 today. Jardiance , Entresto  and Aldactone  are already on hold.  Patient on Lasix .  Will defer to cardiology for  that.   Hypothyroidism Continue Synthroid .  TSH within normal limit   Hyperlipidemia Continue pravastatin .   Hypokalemia: Low again, will replenish.  DVT prophylaxis: SCDs Start: 08/27/24 2138, Eliquis    Code Status: Full Code  Family Communication: Wife present at bedside.  Plan of care discussed with patient in length and he/she verbalized understanding and agreed with it.  Status is: Inpatient Remains inpatient appropriate because: Needs IV diuresis/now on milrinone  drip   Estimated body mass index is 22.27 kg/m as calculated from the following:   Height as of this encounter: 5' 10 (1.778 m).   Weight as of this encounter: 70.4 kg.    Nutritional Assessment: Body mass index is 22.27 kg/m.[name] Seen by dietician.  I agree with the assessment and plan as outlined below: Nutrition Status:        . Skin Assessment: I have examined the patient's skin and I agree with the wound assessment as performed by the wound care RN as outlined below:    Consultants:  Cardiology  Procedures:  None  Antimicrobials:  Anti-infectives (From admission, onward)    None         Subjective: Patient seen and examined, his wife and son at the bedside.  Patient says that he feels a lot better.  Denies any shortness of breath.  Objective: Vitals:   08/30/24 2334 08/31/24 0300 08/31/24 0326 08/31/24 0726  BP: 126/78  128/67 104/65  Pulse: 66  89 95  Resp: 19  18 18   Temp: 97.7 F (36.5 C)  98 F (36.7 C) 98 F (36.7 C)  TempSrc: Oral  Oral Oral  SpO2: 95%  96% 94%  Weight:  70.4 kg    Height:        Intake/Output Summary (Last 24 hours) at 08/31/2024 1119 Last data filed at 08/31/2024 1000 Gross per 24 hour  Intake 1054.53 ml  Output 1200 ml  Net -145.47 ml   Filed Weights   08/29/24 0340 08/30/24 0412 08/31/24 0300  Weight: 69.7 kg 70.5 kg 70.4 kg    Examination:  General exam: Appears calm and comfortable  Respiratory system: Clear to auscultation. Respiratory  effort normal. Cardiovascular system: S1 & S2 heard, RRR. No JVD, murmurs, rubs, gallops or clicks.  Trace pitting edema bilateral lower extremity Gastrointestinal system: Abdomen is nondistended, soft and nontender. No organomegaly or masses felt. Normal bowel sounds heard. Central nervous system: Alert and oriented. No focal neurological deficits. Extremities: Symmetric 5 x 5 power. Skin: No rashes, lesions or ulcers.  Psychiatry: Judgement and insight appear normal. Mood & affect appropriate.   Data Reviewed: I have personally reviewed following labs and imaging studies  CBC: Recent Labs  Lab 08/27/24 1907 08/28/24 0204 08/29/24 0920 08/30/24 0648 08/31/24 0454  WBC 8.2 11.6* 9.5 12.1* 10.2  NEUTROABS 7.2 9.9* 6.7 8.4* 7.1  HGB 15.9 15.6 15.6 15.4 14.3  HCT 47.4 44.9 44.1 43.8 39.8  MCV 99.2 94.3 92.8 93.6 91.7  PLT 121* 140* 113* 120* 111*   Basic Metabolic Panel: Recent Labs  Lab 08/27/24 1907 08/27/24 2349 08/28/24 9795 08/29/24 0920 08/30/24 9351 08/30/24 9246  08/31/24 0454  NA 134* 137 140 134* 138  --  133*  K 5.5* 4.8 4.6 3.5 3.3*  --  3.1*  CL 98 100 97* 93* 89*  --  92*  CO2 19* 20* 26 32 34*  --  33*  GLUCOSE 189* 137* 124* 145* 107*  --  127*  BUN 16 17 18  35* 37*  --  50*  CREATININE 1.49* 1.51* 1.54* 1.94* 1.72*  --  1.88*  CALCIUM 9.2 9.2 9.4 8.8* 8.7*  --  8.4*  MG 2.9*  --  2.5*  --   --  1.9 2.4  PHOS  --   --  5.3*  --   --   --   --    GFR: Estimated Creatinine Clearance: 32.2 mL/min (A) (by C-G formula based on SCr of 1.88 mg/dL (H)). Liver Function Tests: Recent Labs  Lab 08/28/24 0204 08/29/24 0920 08/30/24 0648 08/31/24 0454  AST 18 24 23 23   ALT 19 20 19 18   ALKPHOS 69 59 56 51  BILITOT 2.4* 1.8* 2.0* 1.5*  PROT 7.5 7.3 7.0 6.7  ALBUMIN 4.2 4.0 4.0 3.7   No results for input(s): LIPASE, AMYLASE in the last 168 hours. No results for input(s): AMMONIA in the last 168 hours. Coagulation Profile: Recent Labs  Lab  08/28/24 0204  INR 1.1   Cardiac Enzymes: Recent Labs  Lab 08/27/24 2349  CKTOTAL 47*   BNP (last 3 results) No results for input(s): PROBNP in the last 8760 hours. HbA1C: No results for input(s): HGBA1C in the last 72 hours. CBG: No results for input(s): GLUCAP in the last 168 hours. Lipid Profile: No results for input(s): CHOL, HDL, LDLCALC, TRIG, CHOLHDL, LDLDIRECT in the last 72 hours. Thyroid  Function Tests: No results for input(s): TSH, T4TOTAL, FREET4, T3FREE, THYROIDAB in the last 72 hours.  Anemia Panel: No results for input(s): VITAMINB12, FOLATE, FERRITIN, TIBC, IRON, RETICCTPCT in the last 72 hours. Sepsis Labs: Recent Labs  Lab 08/27/24 2105 08/27/24 2349 08/28/24 0204 08/29/24 1515 08/29/24 1826  PROCALCITON  --  0.11  --   --   --   LATICACIDVEN 2.0*  --  1.3 1.2 1.3    Recent Results (from the past 240 hours)  Resp panel by RT-PCR (RSV, Flu A&B, Covid) Anterior Nasal Swab     Status: None   Collection Time: 08/27/24  7:07 PM   Specimen: Anterior Nasal Swab  Result Value Ref Range Status   SARS Coronavirus 2 by RT PCR NEGATIVE NEGATIVE Final   Influenza A by PCR NEGATIVE NEGATIVE Final   Influenza B by PCR NEGATIVE NEGATIVE Final    Comment: (NOTE) The Xpert Xpress SARS-CoV-2/FLU/RSV plus assay is intended as an aid in the diagnosis of influenza from Nasopharyngeal swab specimens and should not be used as a sole basis for treatment. Nasal washings and aspirates are unacceptable for Xpert Xpress SARS-CoV-2/FLU/RSV testing.  Fact Sheet for Patients: bloggercourse.com  Fact Sheet for Healthcare Providers: seriousbroker.it  This test is not yet approved or cleared by the [place]  FDA and has been authorized for detection and/or diagnosis of SARS-CoV-2 by FDA under an Emergency Use Authorization (EUA). This EUA will remain in effect (meaning this test can  be used) for the duration of the COVID-19 declaration under Section 564(b)(1) of the Act, 21 U.S.C. section 360bbb-3(b)(1), unless the authorization is terminated or revoked.     Resp Syncytial Virus by PCR NEGATIVE NEGATIVE Final    Comment: (NOTE) Fact Sheet for  Patients: bloggercourse.com  Fact Sheet for Healthcare Providers: seriousbroker.it  This test is not yet approved or cleared by the [place]  FDA and has been authorized for detection and/or diagnosis of SARS-CoV-2 by FDA under an Emergency Use Authorization (EUA). This EUA will remain in effect (meaning this test can be used) for the duration of the COVID-19 declaration under Section 564(b)(1) of the Act, 21 U.S.C. section 360bbb-3(b)(1), unless the authorization is terminated or revoked.  Performed at [facility], [address], [place], [place] [address]   Culture, blood (routine x 2)     Status: None (Preliminary result)   Collection Time: 08/27/24  7:15 PM   Specimen: BLOOD RIGHT HAND  Result Value Ref Range Status   Specimen Description BLOOD RIGHT HAND  Final   Special Requests   Final    BOTTLES DRAWN AEROBIC AND ANAEROBIC Blood Culture results may not be optimal due to an inadequate volume of blood received in culture bottles   Culture   Final    NO GROWTH 4 DAYS Performed at [facility], [address], [place], [place] [address]    Report Status PENDING  Incomplete  Culture, blood (routine x 2)     Status: None (Preliminary result)   Collection Time: 08/27/24  7:20 PM   Specimen: BLOOD RIGHT FOREARM  Result Value Ref Range Status   Specimen Description BLOOD RIGHT FOREARM  Final   Special Requests   Final    BOTTLES DRAWN AEROBIC AND ANAEROBIC Blood Culture adequate volume   Culture   Final    NO GROWTH 4 DAYS Performed at [facility], [address], [place], [place] [address]    Report Status PENDING  Incomplete      Radiology Studies: DG CHEST PORT 1 VIEW Result Date: 08/29/2024 EXAM: 1 VIEW(S) XRAY OF THE CHEST 08/29/2024 09:35:00 PM COMPARISON: 08/27/2024 CLINICAL HISTORY: S/P PICC central line placement FINDINGS: LINES, TUBES AND DEVICES: Right PICC in place with tip overlying superior cavoatrial junction. LUNGS AND PLEURA: No focal pulmonary opacity. No pleural effusion. No pneumothorax. HEART AND MEDIASTINUM: Cardiomegaly. Aortic atherosclerosis. BONES AND SOFT TISSUES: Surgical clips in right supraclavicular region. No acute osseous abnormality. IMPRESSION: 1. Right PICC in place with tip overlying superior cavoatrial junction. 2. Cardiomegaly. Electronically signed by: Norman Gatlin MD 08/29/2024 09:39 PM EST RP Workstation: [id number]   US  EKG SITE RITE Result Date: 08/29/2024 If Site Rite image not attached, placement could not be confirmed due to current cardiac rhythm.   Scheduled Meds:  apixaban   5 mg Oral BID   Chlorhexidine Gluconate Cloth  6 each Topical Daily   fluticasone  furoate-vilanterol  1 puff Inhalation Daily   levothyroxine   137 mcg Oral Q0600   potassium chloride  30 mEq Oral Q4H   pravastatin   40 mg Oral Daily   sodium chloride  flush  10-40 mL Intracatheter Q12H   Continuous Infusions:  amiodarone 30 mg/hr (08/31/24 0847)   furosemide  120 mg (08/31/24 0814)   milrinone 0.25 mcg/kg/min (08/31/24 0146)     LOS: 3 days   Fredia Skeeter, MD [facility]  08/31/2024, 11:19 AM   *Please note that this is a verbal dictation therefore any spelling or grammatical errors are due to the Dragon Medical One system interpretation.  Please page via Amion and do not message via secure chat for urgent patient care matters. Secure chat can be used for non urgent patient care matters.  How to contact the [facility] Attending or Consulting  provider 7A - 7P or covering provider during after hours 7P -7A, for this patient?  Check the care team in [facility] and look for a) attending/consulting  [facility] provider listed and b) the [facility] team listed. Page or secure chat 7A-7P. Log into [email or web address] and use [facility] universal password to access. If you do not have the password, please contact the hospital operator. Locate the [facility] provider you are looking for under [facility] and page to a number that you can be directly reached. If you still have difficulty reaching the provider, please page the [facility] (Director on Call) for the Hospitalists listed on amion for assistance.

## 2024-08-31 NOTE — Progress Notes (Signed)
 MCS EDUCATION NOTE:                VAD evaluation consent reviewed and signed by pt [name] [name] and designated caregiver [name] [name].  Initial VAD teaching completed with pt and caregiver.   VAD educational packet including Understanding Your Options with Advanced Heart Failure, [name] [name] for VAD Evaluation and Potential Implantation consent, and [employer] Heartmate 3 Left Ventricular Device (LVAD) Patient Guide, Heartmate 3 Left Ventricular Assist System Patient Education Program DVD, [place] HM III Patient Education, [place] Mechanical Circulatory Support Program, and Decision Aids for Left Ventricular Assist Device reviewed in detail and left at bedside for continued reference.  All questions answered regarding VAD implant, hospital stay, and what to expect when discharged home living with a heart pump. Pt identified [name] [name] as his primary caregiver if this therapy should be deemed appropriate for destination therapy.  Explained need for 24/7 care when pt is discharged home due to sternal precautions, adaptation to living on support, emotional support, consistent and meticulous exit site care and management, medication adherence and high volume of follow up visits with the VAD Clinic after discharge; both pt and caregiver verbalized understanding of above.   Explained that LVAD can be implanted for two indications in the setting of advanced left ventricular heart failure treatment:  Bridge to transplant - used for patients who cannot safely wait for heart transplant without this device.  Or    Destination therapy - used for patients until end of life or recovery of heart function.  Patient and caregiver(s) acknowledge that the indication at this point in time for LVAD therapy would be for destination therapy due to age.  Provided brief equipment overview and demonstration with HeartMate III training loop including discussion on the following:   a) mobile  power unit b) system controller   c) universal magazine features editor   d) battery clips   e) Batteries   f)  Perc lock   g) Percutaneous lead   Demonstrated and discussed:  a) changing power source on system controller from tethered (MPU) to untethered (battery) mode   b) changing power source on system controller from untethered (battery) to tethered (MPU) mode   c) how to monitor battery life both on the system controller and on each individual battery   d) changing batteries   Reviewed and supplied a copy of home inspection check list stressing that only three pronged grounded power outlets can be used for VAD equipment. Pt confirmed home has electrical outlets that will support the equipment along with access working telephone.  Identified the following lifestyle modifications while living on MCS:    1. No driving for at least three months and then only if doctor gives permission to do so.   2. No tub baths while pump implanted, and shower only when doctor gives permission.   3. No swimming or submersion in water while implanted with pump.   4. No contact sports or engaging in jumping activities.   5. Always have a backup controller, charged spare batteries, and battery clips nearby at all times in case of emergency.   6. Call the doctor or hospital contact person if any change in how the pump sounds, feels, or works.   7. Plan to sleep only when connected to the power module.   8. Do not sleep on your stomach.   9. Keep a backup system controller, charged batteries, battery clips, and flashlight near you during sleep in  case of electrical power outage.   10. Exit site care including dressing changes, monitoring for infection, and importance of keeping percutaneous lead stabilized at all times.    Pt continues to decline the option to have one of our current patients and caregiver(s) come to talk with them about living on support to assist with decision making.   Reviewed pictures of VAD  drive line, site care, dressing changes, and drive line stabilization including securement attachment device and abdominal binder. Discussed with pt and family that they will be required to purchase dressing supplies as long as patient has the VAD in place.   Reinforced need for 24 hour/7 day week caregivers; pt designated [name] [name] as caregiver. He will also need to abide by sternal precautions with no lifting >10lbs, pushing, pulling and will need assistance with adapting to new life style with VAD equipment and care.   Intermacs patient survival statistics through June 2025 reviewed with patient and caregiver as follows:    The patient understands that from this discussion it does not mean that they will receive the device, but that depends on an extensive evaluation process. The patient is aware of the fact that if at anytime they want to stop the evaluation process they can.  All questions have been answered at this time and contact information was provided should they encounter any further questions.  They are both agreeable at this time to the evaluation process and will move forward.   [name] Lunger RN, BSN VAD Coordinator 24/7 Pager [phone]

## 2024-09-01 ENCOUNTER — Inpatient Hospital Stay (HOSPITAL_COMMUNITY)

## 2024-09-01 ENCOUNTER — Encounter (HOSPITAL_COMMUNITY): Admission: EM | Disposition: E | Payer: Self-pay | Source: Home / Self Care | Attending: Cardiology

## 2024-09-01 DIAGNOSIS — I5023 Acute on chronic systolic (congestive) heart failure: Secondary | ICD-10-CM | POA: Diagnosis not present

## 2024-09-01 DIAGNOSIS — N400 Enlarged prostate without lower urinary tract symptoms: Secondary | ICD-10-CM | POA: Diagnosis not present

## 2024-09-01 DIAGNOSIS — Z01818 Encounter for other preprocedural examination: Secondary | ICD-10-CM | POA: Diagnosis not present

## 2024-09-01 DIAGNOSIS — K7689 Other specified diseases of liver: Secondary | ICD-10-CM | POA: Diagnosis not present

## 2024-09-01 DIAGNOSIS — J323 Chronic sphenoidal sinusitis: Secondary | ICD-10-CM | POA: Diagnosis not present

## 2024-09-01 DIAGNOSIS — K5730 Diverticulosis of large intestine without perforation or abscess without bleeding: Secondary | ICD-10-CM | POA: Diagnosis not present

## 2024-09-01 DIAGNOSIS — K029 Dental caries, unspecified: Secondary | ICD-10-CM | POA: Diagnosis not present

## 2024-09-01 HISTORY — PX: RIGHT HEART CATH: CATH118263

## 2024-09-01 LAB — PULMONARY FUNCTION TEST
DL/VA % pred: 137 %
DL/VA: 5.4 ml/min/mmHg/L
DLCO cor % pred: 75 %
DLCO cor: 18.7 ml/min/mmHg
DLCO unc % pred: 73 %
DLCO unc: 18.21 ml/min/mmHg
FEF 25-75 Post: 0.97 L/s
FEF 25-75 Pre: 0.54 L/s
FEF2575-%Change-Post: 81 %
FEF2575-%Pred-Post: 46 %
FEF2575-%Pred-Pre: 25 %
FEV1-%Change-Post: 13 %
FEV1-%Pred-Post: 43 %
FEV1-%Pred-Pre: 38 %
FEV1-Post: 1.3 L
FEV1-Pre: 1.14 L
FEV1FVC-%Change-Post: 5 %
FEV1FVC-%Pred-Pre: 90 %
FEV6-%Change-Post: 6 %
FEV6-%Pred-Post: 47 %
FEV6-%Pred-Pre: 44 %
FEV6-Post: 1.82 L
FEV6-Pre: 1.7 L
FEV6FVC-%Change-Post: 1 %
FEV6FVC-%Pred-Post: 106 %
FEV6FVC-%Pred-Pre: 104 %
FVC-%Change-Post: 7 %
FVC-%Pred-Post: 45 %
FVC-%Pred-Pre: 42 %
FVC-Post: 1.88 L
FVC-Pre: 1.75 L
Post FEV1/FVC ratio: 69 %
Post FEV6/FVC ratio: 99 %
Pre FEV1/FVC ratio: 65 %
Pre FEV6/FVC Ratio: 97 %
RV % pred: 139 %
RV: 3.64 L
TLC % pred: 77 %
TLC: 5.49 L

## 2024-09-01 LAB — COMPREHENSIVE METABOLIC PANEL WITH GFR
ALT: 16 U/L (ref 0–44)
AST: 18 U/L (ref 15–41)
Albumin: 3.7 g/dL (ref 3.5–5.0)
Alkaline Phosphatase: 54 U/L (ref 38–126)
Anion gap: 14 (ref 5–15)
BUN: 50 mg/dL — ABNORMAL HIGH (ref 8–23)
CO2: 29 mmol/L (ref 22–32)
Calcium: 8.2 mg/dL — ABNORMAL LOW (ref 8.9–10.3)
Chloride: 88 mmol/L — ABNORMAL LOW (ref 98–111)
Creatinine, Ser: 2.03 mg/dL — ABNORMAL HIGH (ref 0.61–1.24)
GFR, Estimated: 33 mL/min — ABNORMAL LOW (ref >60)
Glucose, Bld: 204 mg/dL — ABNORMAL HIGH (ref 70–99)
Potassium: 3.3 mmol/L — ABNORMAL LOW (ref 3.5–5.1)
Sodium: 131 mmol/L — ABNORMAL LOW (ref 135–145)
Total Bilirubin: 1 mg/dL (ref 0.0–1.2)
Total Protein: 6.5 g/dL (ref 6.5–8.1)

## 2024-09-01 LAB — CBC WITH DIFFERENTIAL/PLATELET
Abs Immature Granulocytes: 0.09 K/uL — ABNORMAL HIGH (ref 0.00–0.07)
Basophils Absolute: 0.1 K/uL (ref 0.0–0.1)
Basophils Relative: 1 %
Eosinophils Absolute: 0.4 K/uL (ref 0.0–0.5)
Eosinophils Relative: 4 %
HCT: 38.2 % — ABNORMAL LOW (ref 39.0–52.0)
Hemoglobin: 13.7 g/dL (ref 13.0–17.0)
Immature Granulocytes: 1 %
Lymphocytes Relative: 22 %
Lymphs Abs: 2.3 K/uL (ref 0.7–4.0)
MCH: 32.6 pg (ref 26.0–34.0)
MCHC: 35.9 g/dL (ref 30.0–36.0)
MCV: 91 fL (ref 80.0–100.0)
Monocytes Absolute: 1.1 K/uL — ABNORMAL HIGH (ref 0.1–1.0)
Monocytes Relative: 10 %
Neutro Abs: 6.5 K/uL (ref 1.7–7.7)
Neutrophils Relative %: 62 %
Platelets: 134 K/uL — ABNORMAL LOW (ref 150–400)
RBC: 4.2 MIL/uL — ABNORMAL LOW (ref 4.22–5.81)
RDW: 12.8 % (ref 11.5–15.5)
WBC: 10.4 K/uL (ref 4.0–10.5)
nRBC: 0 % (ref 0.0–0.2)

## 2024-09-01 LAB — LIPID PANEL
Cholesterol: 160 mg/dL (ref 0–200)
HDL: 44 mg/dL (ref >40)
LDL Cholesterol: 102 mg/dL — ABNORMAL HIGH (ref 0–99)
Total CHOL/HDL Ratio: 3.6 ratio
Triglycerides: 70 mg/dL (ref <150)
VLDL: 14 mg/dL (ref 0–40)

## 2024-09-01 LAB — MAGNESIUM: Magnesium: 2.1 mg/dL (ref 1.7–2.4)

## 2024-09-01 LAB — T4, FREE: Free T4: 1.66 ng/dL — ABNORMAL HIGH (ref 0.61–1.12)

## 2024-09-01 LAB — COOXEMETRY PANEL
Carboxyhemoglobin: 1.6 % — ABNORMAL HIGH (ref 0.5–1.5)
Methemoglobin: 0.7 % (ref 0.0–1.5)
O2 Saturation: 65.5 %
Total hemoglobin: 14.3 g/dL (ref 12.0–16.0)

## 2024-09-01 LAB — HEPATITIS B SURFACE ANTIGEN: Hepatitis B Surface Ag: NONREACTIVE

## 2024-09-01 LAB — HIV ANTIBODY (ROUTINE TESTING W REFLEX): HIV Screen 4th Generation wRfx: NONREACTIVE

## 2024-09-01 LAB — CULTURE, BLOOD (ROUTINE X 2)
Culture: NO GROWTH
Culture: NO GROWTH
Special Requests: ADEQUATE

## 2024-09-01 LAB — HEMOGLOBIN A1C
Hgb A1c MFr Bld: 5.7 % — ABNORMAL HIGH (ref 4.8–5.6)
Mean Plasma Glucose: 116.89 mg/dL

## 2024-09-01 LAB — POCT I-STAT EG7
Acid-Base Excess: 4 mmol/L — ABNORMAL HIGH (ref 0.0–2.0)
Acid-Base Excess: 5 mmol/L — ABNORMAL HIGH (ref 0.0–2.0)
Bicarbonate: 28.6 mmol/L — ABNORMAL HIGH (ref 20.0–28.0)
Bicarbonate: 28.7 mmol/L — ABNORMAL HIGH (ref 20.0–28.0)
Calcium, Ion: 1.01 mmol/L — ABNORMAL LOW (ref 1.15–1.40)
Calcium, Ion: 1.07 mmol/L — ABNORMAL LOW (ref 1.15–1.40)
HCT: 41 % (ref 39.0–52.0)
HCT: 42 % (ref 39.0–52.0)
Hemoglobin: 13.9 g/dL (ref 13.0–17.0)
Hemoglobin: 14.3 g/dL (ref 13.0–17.0)
O2 Saturation: 64 %
O2 Saturation: 66 %
Potassium: 3.3 mmol/L — ABNORMAL LOW (ref 3.5–5.1)
Potassium: 3.4 mmol/L — ABNORMAL LOW (ref 3.5–5.1)
Sodium: 133 mmol/L — ABNORMAL LOW (ref 135–145)
Sodium: 134 mmol/L — ABNORMAL LOW (ref 135–145)
TCO2: 30 mmol/L (ref 22–32)
TCO2: 30 mmol/L (ref 22–32)
pCO2, Ven: 40 mmHg — ABNORMAL LOW (ref 44–60)
pCO2, Ven: 40.3 mmHg — ABNORMAL LOW (ref 44–60)
pH, Ven: 7.459 — ABNORMAL HIGH (ref 7.25–7.43)
pH, Ven: 7.463 — ABNORMAL HIGH (ref 7.25–7.43)
pO2, Ven: 32 mmHg (ref 32–45)
pO2, Ven: 33 mmHg (ref 32–45)

## 2024-09-01 LAB — HEPATITIS C ANTIBODY: HCV Ab: NONREACTIVE

## 2024-09-01 LAB — ANTITHROMBIN III: AntiThromb III Func: 100 % (ref 75–120)

## 2024-09-01 LAB — PREALBUMIN: Prealbumin: 24 mg/dL (ref 18–38)

## 2024-09-01 LAB — TYPE AND SCREEN
ABO/RH(D): A POS
Antibody Screen: NEGATIVE

## 2024-09-01 LAB — LACTATE DEHYDROGENASE: LDH: 144 U/L (ref 105–235)

## 2024-09-01 LAB — URIC ACID: Uric Acid, Serum: 9.7 mg/dL — ABNORMAL HIGH (ref 3.7–8.6)

## 2024-09-01 MED ORDER — LIDOCAINE HCL (PF) 1 % IJ SOLN
INTRAMUSCULAR | Status: AC
  Filled 2024-09-01: qty 30

## 2024-09-01 MED ORDER — LIDOCAINE HCL (PF) 1 % IJ SOLN
INTRAMUSCULAR | Status: DC | PRN
  Administered 2024-09-01: 2 mL

## 2024-09-01 MED ORDER — HEPARIN (PORCINE) IN NACL 1000-0.9 UT/500ML-% IV SOLN
INTRAVENOUS | Status: DC | PRN
  Administered 2024-09-01: 500 mL

## 2024-09-01 MED ORDER — ENSURE PLUS HIGH PROTEIN PO LIQD
237.0000 mL | Freq: Three times a day (TID) | ORAL | Status: DC
  Administered 2024-09-02 – 2024-09-04 (×6): 237 mL via ORAL

## 2024-09-01 MED ORDER — POTASSIUM CHLORIDE CRYS ER 20 MEQ PO TBCR
40.0000 meq | EXTENDED_RELEASE_TABLET | ORAL | Status: AC
  Administered 2024-09-01: 40 meq via ORAL
  Filled 2024-09-01: qty 2

## 2024-09-01 MED ORDER — ALBUTEROL SULFATE (2.5 MG/3ML) 0.083% IN NEBU
2.5000 mg | INHALATION_SOLUTION | Freq: Once | RESPIRATORY_TRACT | Status: AC
  Administered 2024-09-01: 2.5 mg via RESPIRATORY_TRACT

## 2024-09-01 SURGICAL SUPPLY — 8 items
CATH SWAN GANZ 7F STRAIGHT (CATHETERS) IMPLANT
GLIDESHEATH SLENDER 7FR .021G (SHEATH) IMPLANT
KIT MICROPUNCTURE NIT STIFF (SHEATH) IMPLANT
KIT RIGHT HEART ACIST (MISCELLANEOUS) IMPLANT
PACK CARDIAC CATHETERIZATION (CUSTOM PROCEDURE TRAY) ×1 IMPLANT
SHEATH PROBE COVER 6X72 (BAG) IMPLANT
TRANSDUCER W/STOPCOCK (MISCELLANEOUS) IMPLANT
TUBING ART PRESS 72 MALE/FEM (TUBING) IMPLANT

## 2024-09-01 NOTE — Interval H&P Note (Signed)
 History and Physical Interval Note:  09/01/2024 11:22 AM  [name] Mania  has presented today for surgery, with the diagnosis of hf.  The various methods of treatment have been discussed with the patient and family. After consideration of risks, benefits and other options for treatment, the patient has consented to  Procedure(s): RIGHT HEART CATH (N/A) as a surgical intervention.  The patient's history has been reviewed, patient examined, no change in status, stable for surgery.  I have reviewed the patient's chart and labs.  Questions were answered to the patient's satisfaction.     [name] [name]

## 2024-09-01 NOTE — TOC Initial Note (Signed)
 Transition of Care [facility]) - Initial/Assessment Note    Patient Details  Name: [name] [name] MRN: [id number] Date of Birth: 1946-08-24  Transition of Care [facility]) CM/SW Contact:    Justina Delcia Czar, RN Phone Number: [phone] 09/01/2024, 3:44 PM  Clinical Narrative:                 Patient was independent from home with wife. Has RW and cane at home. Wife will transport home at dc. LVAD workup ongoing. Remain on Milrinone  and amiodarone  gtt.   Chart reviewed for discharge readiness, patient not medically stable for d/c. Inpatient CM/CSW will continue to monitor pt's advancement through interdisciplinary progression rounds.   If new pt transition needs arise, MD please place a TOC consult.    Expected Discharge Plan: Home w Home Health Services Barriers to Discharge: Continued Medical Work up   Patient Goals and CMS Choice            Expected Discharge Plan and Services   Discharge Planning Services: CM Consult                                          Prior Living Arrangements/Services     Patient language and need for interpreter reviewed:: Yes Do you feel safe going back to the place where you live?: Yes      Need for Family Participation in Patient Care: Yes (Comment) Care giver support system in place?: Yes (comment)   Criminal Activity/Legal Involvement Pertinent to Current Situation/Hospitalization: No - Comment as needed  Activities of Daily Living   ADL Screening (condition at time of admission) Independently performs ADLs?: Yes (appropriate for developmental age) Is the patient deaf or have difficulty hearing?: No Does the patient have difficulty seeing, even when wearing glasses/contacts?: No Does the patient have difficulty concentrating, remembering, or making decisions?: No  Permission Sought/Granted Permission sought to share information with : Case Manager, PCP, Family Supports                Emotional Assessment               Admission diagnosis:  Acute on chronic systolic heart failure (HCC) [I50.23] Rapid atrial fibrillation (HCC) [I48.91] Acute on chronic congestive heart failure, unspecified heart failure type (HCC) [I50.9] Patient Active Problem List   Diagnosis Date Noted   Acute on chronic systolic heart failure (HCC) 08/27/2024   Paroxysmal atrial fibrillation with RVR (HCC) 08/27/2024   Hyperkalemia 08/27/2024   Lactic acidosis 08/27/2024   High anion gap metabolic acidosis 08/27/2024   HLD (hyperlipidemia) 08/27/2024   Coronary artery disease involving native coronary artery of native heart without angina pectoris 10/04/2023   Nonischemic cardiomyopathy (HCC) 10/04/2023   Mitral valve insufficiency 08/31/2023   Chronic HFrEF (heart failure with reduced ejection fraction) (HCC) 04/07/2023   SOB (shortness of breath) 01/16/2022   Melanoma of skin (HCC) 12/28/2021   Wheezing 10/30/2021   Cardiomegaly 10/10/2021   Skin lesion 12/20/2019   Cirrhosis (HCC) 12/14/2018   Health care maintenance 12/10/2017   Thrombocytopenia 12/10/2017   Cough 10/03/2015   Advance care planning 11/02/2014   Other malaise and fatigue 06/17/2014   Acquired hypothyroidism 11/05/2013   SCCA (squamous cell carcinoma) of skin 07/04/2013   Medicare annual wellness visit, subsequent 08/18/2011   BPH (benign prostatic hyperplasia) 08/18/2011   COLONIC POLYPS 01/27/2010   TENDINITIS, LEFT THUMB 07/30/2009  Hyperglycemia 01/22/2009   HYPERCHOLESTEROLEMIA, MIXED 04/03/2008   NEOPLASM OF UNCERTAIN BEHAVIOR OF SKIN 08/23/2007   HYPERTENSION, BENIGN ESSENTIAL 02/01/2007   ERECTILE DYSFUNCTION 01/12/2007   Atrial fibrillation (HCC) 01/12/2007   Congestive heart failure (HCC) 01/12/2007   PCP:  Cleatus Arlyss RAMAN, MD Pharmacy:   [facility] [address], [place] - [address] [place] [address] Phone: [phone] Fax: [phone]     Social Drivers of Health (SDOH) Social  History: SDOH Screenings   Food Insecurity: No Food Insecurity (08/28/2024)  Housing: Low Risk  (08/28/2024)  Transportation Needs: No Transportation Needs (08/28/2024)  Utilities: Not At Risk (08/28/2024)  Alcohol Screen: Low Risk  (01/04/2024)  Depression (PHQ2-9): Low Risk  (01/06/2024)  Financial Resource Strain: Low Risk  (01/04/2024)  Physical Activity: Inactive (01/04/2024)  Social Connections: Moderately Integrated (08/28/2024)  Stress: No Stress Concern Present (01/04/2024)  Tobacco Use: Medium Risk (08/27/2024)  Health Literacy: Adequate Health Literacy (01/04/2024)   SDOH Interventions:     Readmission Risk Interventions    08/28/2024    4:12 PM  Readmission Risk Prevention Plan  Transportation Screening Complete  PCP or Specialist Appt within 5-7 Days Complete  Home Care Screening Complete  Medication Review (RN CM) Complete

## 2024-09-01 NOTE — Progress Notes (Signed)
 Initial Nutrition Assessment  DOCUMENTATION CODES:   Not applicable  INTERVENTION:  Post RHC, recommend: Resume regular diet Ensure Plus High Protein po TID, each supplement provides 350 kcal and 20 grams of protein.  NUTRITION DIAGNOSIS:   Increased nutrient needs related to acute illness as evidenced by estimated needs.  GOAL:   Patient will meet greater than or equal to 90% of their needs  MONITOR:   PO intake, Supplement acceptance, Labs, Weight trends, I & O's  REASON FOR ASSESSMENT:   Consult LVAD Eval  ASSESSMENT:   Pt admitted with 2-3 days shortness of breath r/t acute on chronic heart failure. PMH significant for biventricular systolic heart failure, mixed ischemic/nonischemic CM, CAD, severe functional MR, paroxysmal afib, HTN, hypothyroidism.  11/30 admitted with acute on chronic systolic heart failure   CXR with concern for cardiomegaly and pulmonary vascular congestion  In the process of VAD work up.  Echo this admission EF <30% NPO for RHC today.   Spoke with RN, pt off unit for testing prior to RHC.  Unable to obtain detailed nutrition related history.  Given documentation of variable meal intakes as noted below, will add nutrition supplements to start following testing/procedures today to augment oral intake and optimize nutritional status prior to possible VAD procedure.   Meal documentation reflects variable oral intake over the last 3 days.  12/2: 50% lunch, 15% dinner 12/3: 80% breakfast, 0% lunch, 50% dinner 12/4: 75% breakfast, 90% lunch, 15% dinner 12/5: NPO for RHC  Admission weights: 73.5->71.8 kg x 6 days (-1.7 kg) Prior to admission over the last year, pt's weights appear to have an average of 76.2 kg (ranging between 72-78 kg). Given patient with chronic heart failure on diuretics, difficult to determine whether these are true dry weight fluctuations vs fluid changes.   UOP: x24 hours I/O's: - since admit  Medications:  klor-con , IV lasix  120mg  BID  Labs:  Sodium 131 Potassium 3.3 Chloride 88 BUN 50 Cr  2.03 GFR 33 HgbA1c 5.7%  NUTRITION - FOCUSED PHYSICAL EXAM: Remote assessment deferred to in person follow up  Diet Order:   Diet Order             Diet NPO time specified  Diet effective midnight                   EDUCATION NEEDS:   Not appropriate for education at this time  Skin:  Skin Assessment: Reviewed RN Assessment  Last BM:  12/3 type 7 small  Height:   Ht Readings from Last 1 Encounters:  08/27/24 5' 10 (1.778 m)    Weight:   Wt Readings from Last 1 Encounters:  09/01/24 71.8 kg   BMI:  Body mass index is 22.7 kg/m.  Estimated Nutritional Needs:   Kcal:  2000-2200  Protein:  95-110g  Fluid:  2L  Allie Ladon Heney, RDN, LDN Clinical Nutrition See AMiON for contact information.

## 2024-09-01 NOTE — Consult Note (Signed)
 Palliative Care Consult Note                                  Date: 09/01/2024   Patient Name: [name] [name]  DOB: 1946/04/13  MRN: [id number]  Age / Sex: 78 y.o., male  PCP: Cleatus Arlyss RAMAN, MD Referring Physician: Vernon Ranks, MD  Reason for Consultation: Establishing goals of care  HPI/Patient Profile: 78 y.o. male  with past medical history of chronic systolic heart failure due to mixed ischemic/nonischemic cardiomyopathy, CAD, permanent A-fib, severe MR, alcohol  related cirrhosis, hypothyroidism, hypertension, and hyperlipidemia who was admitted on 08/27/2024 with acute on chronic CHF, now with low output.   Palliative Medicine has been consulted for goals of care discussions in the setting of LVAD evaluation.   Clinical Assessment and Goals of Care:   Extensive chart review has been completed prior to meeting with patient/family including labs, vital signs, imaging, progress/consult notes, orders, medications and available advance directive documents.    I met with [name] [name], his wife/[name], and his son/David to discuss diagnosis, prognosis, and GOC.  I introduced Palliative Medicine as specialized medical care for people with serious illness, and as part of the interdisciplinary team involved in LVAD evaluation.    Created space and opportunity for patient and family to express thoughts and feelings regarding current medical situation. Values and goals of care were attempted to be elicited.  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  He and [name] have been married for 1.5 years.  He has 1 son Charolotte) from his first marriage.   Patient/Family Understanding of Illness: ***  Discussion: We discussed patient's current illness and what it means in the larger context of his ongoing co-morbidities. Current clinical status was reviewed. Natural disease trajectory of *** was discussed.  A  discussion was had regarding the concept of a preparedness plan as a relates to LVAD as destination therapy.  Kashon is open to all offered and available interventions to prolong life as well as improve his quality of life.  He is hopeful to be a candidate for LVAD and has a good understanding of the process, care, and responsibility this intervention would entail.  We reviewed risks and benefits of LVAD surgery.  Discussed the importance of continued conversation with the medical team regarding overall plan of care and treatment options, ensuring decisions are within the context of the patients values and GOCs.  Questions and concerns addressed.  PMT contact info provided.  Emotional support provided.   Review of Systems  Respiratory:  Negative for shortness of breath.     Objective:   Primary Diagnoses: Present on Admission:  Acute on chronic systolic heart failure (HCC)  SOB (shortness of breath)  Paroxysmal atrial fibrillation with RVR (HCC)  Hyperkalemia  Lactic acidosis  High anion gap metabolic acidosis  HLD (hyperlipidemia)  Acquired hypothyroidism   Physical Exam Vitals reviewed.  Constitutional:      General: He is not in acute distress. Cardiovascular:     Rate and Rhythm: Normal rate.  Pulmonary:     Effort: Pulmonary effort is normal.  Neurological:     Mental Status: He is alert and oriented to person, place, and time.  Psychiatric:        Mood and Affect: Mood normal.     Vital Signs:  BP 128/85 (BP Location: Left Arm)   Pulse 76  Temp (!) 97.4 F (36.3 C) (Oral)   Resp 18   Ht 5' 10 (1.778 m)   Wt 71.8 kg   SpO2 94%   BMI 22.70 kg/m   Palliative Assessment/Data: ***     Assessment & Plan:   SUMMARY OF RECOMMENDATIONS   ***  Primary Decision Maker: {Primary Decision Fjxzm:78612}  Existing Vynca/ACP Documentation:   Code Status/Advance Care Planning: {Palliative Code status:23503}  Symptom Management:  ***  Prognosis:   {Palliative Care Prognosis:23504}  Discharge Planning:  {Palliative dispostion:23505}   Discussed with: ***    Thank you for allowing us  to participate in the care of Sky W Blank   Time Total: ***  Greater than 50%  of this time was spent counseling and coordinating care related to the above assessment and plan.  Signed by: Recardo Loll, NP Palliative Medicine Team  Team Phone # [phone]  For individual providers, please see AMION

## 2024-09-01 NOTE — Progress Notes (Addendum)
 Advanced Heart Failure Rounding Note  Cardiologist: [name], MD  AHF: Dr. Zenaida  Chief Complaint: A/c Systolic HF Patient Profile  78 y/o male w/ chronic systolic heart failure w/ biventricular dysfunction d/t mixed ischemic/nonischemic CM, CAD, severe functional MR and permanent atrial fibrillation admitted w/ a/c CHF w/ NYHA Class IV symptoms. Now w/ low output. Initial Co-ox 47%. Started on Milrinone .  Subjective:   Co-ox 66% on 0.25 of milrinone .   Lasix  120 IV BID. -1.1L UOP. CVP unhooked.   Scr 1.88 -> 2.03  Wife at bedside. Denies CP/SOB.   Objective:   Weight Range: 71.8 kg Body mass index is 22.7 kg/m.   Vital Signs:   Temp:  [97.7 F (36.5 C)-98.2 F (36.8 C)] 98.2 F (36.8 C) (12/05 0449) Pulse Rate:  [67-95] 67 (12/04 2020) Resp:  [18-20] 20 (12/05 0449) BP: (104-129)/(65-78) 117/78 (12/04 2020) SpO2:  [94 %-97 %] 97 % (12/04 2020) Weight:  [71.8 kg] 71.8 kg (12/05 0441) Last BM Date : 08/30/24  Weight change: Filed Weights   08/30/24 0412 08/31/24 0300 09/01/24 0441  Weight: 70.5 kg 70.4 kg 71.8 kg    Intake/Output:   Intake/Output Summary (Last 24 hours) at 09/01/2024 0708 Last data filed at 08/31/2024 2112 Gross per 24 hour  Intake 717 ml  Output 1100 ml  Net -383 ml    Physical Exam  CVP unhooked General:  elderly appearing.  No respiratory difficulty Neck: JVD ~9 cm.  Cor: irregular rhythm. No murmurs. Lungs: clear Extremities: no edema  Neuro: alert & oriented x 3. Affect pleasant.   Telemetry   Afib 90s, 22 beat NSVT personally reviewed   Labs    CBC Recent Labs    08/31/24 0454 09/01/24 0531  WBC 10.2 10.4  NEUTROABS 7.1 6.5  HGB 14.3 13.7  HCT 39.8 38.2*  MCV 91.7 91.0  PLT 111* 134*   Basic Metabolic Panel Recent Labs    87/96/74 0648 08/30/24 0753 08/31/24 0454  NA 138  --  133*  K 3.3*  --  3.1*  CL 89*  --  92*  CO2 34*  --  33*  GLUCOSE 107*  --  127*  BUN 37*  --  50*  CREATININE 1.72*  --   1.88*  CALCIUM 8.7*  --  8.4*  MG  --  1.9 2.4   Liver Function Tests Recent Labs    08/30/24 0648 08/31/24 0454  AST 23 23  ALT 19 18  ALKPHOS 56 51  BILITOT 2.0* 1.5*  PROT 7.0 6.7  ALBUMIN 4.0 3.7   No results for input(s): LIPASE, AMYLASE in the last 72 hours. Cardiac Enzymes No results for input(s): CKTOTAL, CKMB, CKMBINDEX, TROPONINI in the last 72 hours.   BNP: BNP (last 3 results) Recent Labs    08/27/24 1907 08/28/24 0204  BNP 1,633.9* 1,952.5*    ProBNP (last 3 results) No results for input(s): PROBNP in the last 8760 hours.   D-Dimer No results for input(s): DDIMER in the last 72 hours. Hemoglobin A1C Recent Labs    09/01/24 0500  HGBA1C 5.7*   Fasting Lipid Panel No results for input(s): CHOL, HDL, LDLCALC, TRIG, CHOLHDL, LDLDIRECT in the last 72 hours. Thyroid  Function Tests No results for input(s): TSH, T4TOTAL, T3FREE, THYROIDAB in the last 72 hours.  Invalid input(s): FREET3   Other results:   Imaging    VAS US  LOWER EXTREMITY VENOUS (DVT) Result Date: 08/31/2024  Lower Venous DVT Study Patient Name:  CYRIS  W Reisen  Date of Exam:   08/31/2024 Medical Rec #: [id number]     Accession #:    [id number] Date of Birth: 1946-02-14    Patient Gender: M Patient Age:   51 years Exam [facility] Procedure:      VAS US  LOWER EXTREMITY VENOUS (DVT) Referring Phys: TORIBIO Mehak Roskelley --------------------------------------------------------------------------------  Indications: SOB, and Preoperative evaluation of a medical condition to rule out surgical contraindications (TAR required). Other Indications: A-fib, biventricular systolic heart failure. Anticoagulation: Eliquis . Comparison Study: No prior exam. Performing Technologist: Edilia Elden Appl  Examination Guidelines: A complete evaluation includes B-mode imaging, spectral Doppler, color Doppler, and power Doppler as needed of all accessible portions  of each vessel. Bilateral testing is considered an integral part of a complete examination. Limited examinations for reoccurring indications may be performed as noted. The reflux portion of the exam is performed with the patient in reverse Trendelenburg.  +---------+---------------+---------+-----------+----------+--------------+ RIGHT    CompressibilityPhasicitySpontaneityPropertiesThrombus Aging +---------+---------------+---------+-----------+----------+--------------+ CFV      Full           Yes      Yes                                 +---------+---------------+---------+-----------+----------+--------------+ SFJ      Full           Yes      Yes                                 +---------+---------------+---------+-----------+----------+--------------+ FV Prox  Full                                                        +---------+---------------+---------+-----------+----------+--------------+ FV Mid   Full                                                        +---------+---------------+---------+-----------+----------+--------------+ FV DistalFull                                                        +---------+---------------+---------+-----------+----------+--------------+ PFV      Full                                                        +---------+---------------+---------+-----------+----------+--------------+ POP      Full           Yes      Yes                                 +---------+---------------+---------+-----------+----------+--------------+ PTV      Full                                                        +---------+---------------+---------+-----------+----------+--------------+  PERO     Full                                                        +---------+---------------+---------+-----------+----------+--------------+   +---------+---------------+---------+-----------+----------+--------------+ LEFT      CompressibilityPhasicitySpontaneityPropertiesThrombus Aging +---------+---------------+---------+-----------+----------+--------------+ CFV      Full           Yes      Yes                                 +---------+---------------+---------+-----------+----------+--------------+ SFJ      Full           Yes      Yes                                 +---------+---------------+---------+-----------+----------+--------------+ FV Prox  Full                                                        +---------+---------------+---------+-----------+----------+--------------+ FV Mid   Full                                                        +---------+---------------+---------+-----------+----------+--------------+ FV DistalFull                                                        +---------+---------------+---------+-----------+----------+--------------+ PFV      Full                                                        +---------+---------------+---------+-----------+----------+--------------+ POP      Full           Yes      Yes                                 +---------+---------------+---------+-----------+----------+--------------+ PTV      Full                                                        +---------+---------------+---------+-----------+----------+--------------+ PERO     Full                                                        +---------+---------------+---------+-----------+----------+--------------+  Summary: BILATERAL: - No evidence of deep vein thrombosis seen in the lower extremities, bilaterally. -No evidence of popliteal cyst, bilaterally.   *See table(s) above for measurements and observations. Electronically signed by Debby Robertson on 08/31/2024 at 8:04:16 PM.    Final    VAS US  DOPPLER PRE VAD Result Date: 08/31/2024 PERIOPERATIVE VASCULAR EVALUATION Patient Name:  BIRT REINOSO Wieser  Date of Exam:   08/31/2024 Medical Rec  #: [id number]     Accession #:    [id number] Date of Birth: [birth year]    Patient Gender: M Patient Age:   27 years Exam Location:  [facility] Procedure:      VAS US  DOPPLER PRE VAD Referring Phys: TORIBIO Joniya Boberg --------------------------------------------------------------------------------  Indications:      VAD evaluation, biventricular systolic heart failure, A-fib,                   SOB. Risk Factors:     Hypertension, hyperlipidemia, no history of smoking. Comparison Study: No prior exam. Performing Technologist: Edilia Elden Appl  Examination Guidelines: A complete evaluation includes B-mode imaging, spectral Doppler, color Doppler, and power Doppler as needed of all accessible portions of each vessel. Bilateral testing is considered an integral part of a complete examination. Limited examinations for reoccurring indications may be performed as noted.  Right Carotid Findings: +----------+--------+--------+--------+-------------------------+--------+           PSV cm/sEDV cm/sStenosisDescribe                 Comments +----------+--------+--------+--------+-------------------------+--------+ CCA Prox  100     13                                                +----------+--------+--------+--------+-------------------------+--------+ CCA Distal71      17                                                +----------+--------+--------+--------+-------------------------+--------+ ICA Prox  79      15              heterogenous and calcific         +----------+--------+--------+--------+-------------------------+--------+ ICA Mid   85      22                                                +----------+--------+--------+--------+-------------------------+--------+ ICA Distal60      20                                                +----------+--------+--------+--------+-------------------------+--------+ ECA       94      12                                                 +----------+--------+--------+--------+-------------------------+--------+ +----------+--------+-------+----------------+------------+           PSV cm/sEDV  cmsDescribe        Arm Pressure +----------+--------+-------+----------------+------------+ Subclavian129            Multiphasic, WNL             +----------+--------+-------+----------------+------------+ +---------+--------+--+--------+--+---------+ VertebralPSV cm/s42EDV cm/s12Antegrade +---------+--------+--+--------+--+---------+ Left Carotid Findings: +----------+--------+--------+--------+-------------------------+--------+           PSV cm/sEDV cm/sStenosisDescribe                 Comments +----------+--------+--------+--------+-------------------------+--------+ CCA Prox  115     16                                                +----------+--------+--------+--------+-------------------------+--------+ CCA Distal89      19                                                +----------+--------+--------+--------+-------------------------+--------+ ICA Prox  85      21              heterogenous and calcific         +----------+--------+--------+--------+-------------------------+--------+ ICA Mid   64      14                                                +----------+--------+--------+--------+-------------------------+--------+ ICA Distal64      18                                                +----------+--------+--------+--------+-------------------------+--------+ ECA       82      11                                                +----------+--------+--------+--------+-------------------------+--------+ +----------+--------+--------+----------------+------------+ SubclavianPSV cm/sEDV cm/sDescribe        Arm Pressure +----------+--------+--------+----------------+------------+           150     18      Multiphasic, TWO862           +----------+--------+--------+----------------+------------+ +---------+--------+--+--------+-+---------+ VertebralPSV cm/s29EDV cm/s7Antegrade +---------+--------+--+--------+-+---------+  ABI Findings: +---------+------------------+-----+---------+--------+ Right    Rt Pressure (mmHg)IndexWaveform Comment  +---------+------------------+-----+---------+--------+ Brachial                                 IV lines +---------+------------------+-----+---------+--------+ PTA      167               1.22 triphasic         +---------+------------------+-----+---------+--------+ DP       164               1.20 triphasic         +---------+------------------+-----+---------+--------+ Burnetta Oven                    Normal            +---------+------------------+-----+---------+--------+ +---------+------------------+-----+---------+-------+  Left     Lt Pressure (mmHg)IndexWaveform Comment +---------+------------------+-----+---------+-------+ Brachial 137                    triphasic        +---------+------------------+-----+---------+-------+ PTA      164               1.20 triphasic        +---------+------------------+-----+---------+-------+ DP       166               1.21 biphasic         +---------+------------------+-----+---------+-------+ Great Toe109                    Normal           +---------+------------------+-----+---------+-------+ +-------+---------------+----------------+ ABI/TBIToday's ABI/TBIPrevious ABI/TBI +-------+---------------+----------------+ Right  1.22/ 0.91                      +-------+---------------+----------------+ Left   1.21/ 0.80                      +-------+---------------+----------------+  Summary: Right Carotid: Velocities in the right ICA are consistent with a 1-39% stenosis. Left Carotid: Velocities in the left ICA are consistent with a 1-39% stenosis. Vertebrals:  Bilateral vertebral arteries  demonstrate antegrade flow. Subclavians: Normal flow hemodynamics were seen in bilateral subclavian              arteries.  *See table(s) above for measurements and observations. Right ABI: Resting right ankle-brachial index is within normal range. The right toe-brachial index is normal. Left ABI: Resting left ankle-brachial index is within normal range. The left toe-brachial index is normal.  Electronically signed by Debby Robertson on 08/31/2024 at 8:04:00 PM.    Final       Medications:     Scheduled Medications:  apixaban   5 mg Oral BID   Chlorhexidine  Gluconate Cloth  6 each Topical Daily   fluticasone  furoate-vilanterol  1 puff Inhalation Daily   levothyroxine   137 mcg Oral Q0600   pravastatin   40 mg Oral Daily   sodium chloride  flush  10-40 mL Intracatheter Q12H   sodium chloride  flush  3 mL Intravenous Q12H    Infusions:  sodium chloride      amiodarone  30 mg/hr (08/31/24 2101)   furosemide  120 mg (08/31/24 1731)   milrinone  0.25 mcg/kg/min (08/31/24 2111)    PRN Medications: sodium chloride , acetaminophen  **OR** acetaminophen , melatonin, ondansetron  (ZOFRAN ) IV, sodium chloride  flush, sodium chloride  flush    Assessment/Plan   1. Acute on Chronic Systolic Heart Failure w/ Low Output  - mixed ischemic/idiopathic CM  - Echo 10/2023: LVEF 25 to 30%, moderate to severely dilated, mildly reduced RV function - NYHA IV on admit - Echo this admit EF <20%, RV not well visualized  - Started on Milrinone  for low co-ox (47%) - Remains on Milrinone  0.25. Co-ox improved, 66%.  - CVP unhooked, breathing stable. Will hold off on further diuretics post cath.  - off Coreg  w/ low output - holding digoxin  given elevated level and AKI. Can restart later if Cr improves - concerned he is nearing need for advanced therapies/LVAD.  - RHC today.  Informed Consent   Shared Decision Making/Informed Consent The risks, including but not limited to, [bleeding or vascular complications (1 in 500),  pneumothorax (1 in 1600), arrhythmia (1 in 1000) and death (1 in 5000)], benefits (diagnostic support and/or management of heart failure, pulmonary hypertension) and  alternatives of a right heart catheterization were discussed in detail with Mr. Detweiler and he is willing to proceed.       2. Chronic Afib  - V-rates 90s-110s  - continue IV amio @ 30/hr while on milrinone   - continue Eliquis     3. Severe MR - functional  - LV cavity more dilated on this admission echo in setting of a/c CHF exacerbation, LVIDs 7.7 cm (6.40 cm) - HF optimization per above   4. VT - in setting of worsening HF, 17 beat run tele 12/2 - keep K > 4.0 and Mg > 2.0 - cont IV amio  - he previously declined ICD but may reconsider   5. CAD - [facility] 08/31/2023: LMCA with 20% distal stenosis. LAD with sequential 50% proximal and mid lesions. LCx with 30% mid stenosis, and proximal/mid RCA with 30% disease and 70% stenosis at the ostium of AM1  - denies CP  - continue statin - no ASA given need for Eliquis   6. AKI - b/l SCr 1.4 - bumped to 1.9 w/ diuresis + low output - continue milrinone  + diuresis - follow BMP   7. Hypokalemia - Pending CMET - give K supp w/ diuresis, d/w pharmD  - add spiro if/when SCr stablizes   8. H/o Malignant Melanoma - occipital scalp, s/p excision @ [facility] in 2023 - never followed up for surveillance head CTs  - may need to revisit prior to potential VAD    Length of Stay: 4  Beckey LITTIE Coe, NP  09/01/2024, 7:08 AM  Advanced Heart Failure Team Pager [phone] (M-F; 7a - 5p)  Please contact [facility] for night-coverage after hours (5p -7a ) and weekends on [email or web address]  Patient seen and examined with the above-signed Advanced Practice Provider and/or Housestaff. I personally reviewed laboratory data, imaging studies and relevant notes. I independently examined the patient and formulated the important aspects of the plan. I have edited the note to reflect any of my changes or salient  points. I have personally discussed the plan with the patient and/or family.  Remains on milrinone . Has diuresed well. Co-ox improved. Scr 1.88-> 2.03  General:  Sitting up on side of bed. No resp difficulty HEENT: normal Neck: supple.JVP 7 Cor: Irregular rate & rhythm. No rubs, gallops or murmurs. Lungs: clear Abdomen: soft, nontender, nondistended.Good bowel sounds. Extremities: no cyanosis, clubbing, rash, edema Neuro: alert & orientedx3, cranial nerves grossly intact. moves all 4 extremities w/o difficulty. Affect pleasant  Hemodynamically improved on milrinone  but Scr up. VAD w/u ongoing. He remains uncertain if he wants to proceed. Will plan RHC today.   Will need Scr to be < 1.7 to proceed.   Await PFTs and scans  Toribio Fuel, MD  11:21 AM

## 2024-09-01 NOTE — Consult Note (Addendum)
 [address] [place] [address]             [phone]    [facility] Medical Record #[id number] Date of Birth: Jul 10, 1946  Referring: No ref. provider found Primary Care: Cleatus Arlyss RAMAN, MD Primary Cardiologist:Christopher End, MD  Chief Complaint:    Chief Complaint  Patient presents with   Shortness of Breath    History of Present Illness:     [name] [name] is a 79 y.o. male who presents for surgical evaluation of LVAD.  He has a history of nonischemic cardiomyopathy, permanent A-fib, hypertension, hyperlipidemia, severe MR, EtOH-related cirrhosis and hypothyroidism.  He was doing well at home but was recently admitted for decompensated heart failure.  He has been doing well on a Lasix  drip and milrinone .  Currently he feels well and his breathing is much better.  He has been maxed out on outpatient GDMT due to persistent low BP.    He lives with his wife and is very active.  He does not like to sit still.  He is independent in his ADLs and very functional.  His father died of heart failure and he is unsure if he wants to pursue LVAD.  He has remained stable on milrinone  and lasix  gtt.  Although his Cre today is elevated to 2.0 from a baseline of about 1.2-1.3.  He still needs a CT scan but is unsure if he can lay flat.  He also has a history of cirrhosis dating back to at least 2016.  Review of Care Everywhere describes a RUQ US  from 2018 with cirrhotic liver changes and his CT CAP here from 2016 demonstrated a fatty, enlarged and cirrhotic liver.  His liver function tests are normal.  LHC (08/31/23): Mild to moderate, non-obstructive CAD RHC (Pending): TBD TTE (08/28/24 and 11/16/23): Severely reduced LVEF, RV is mild-moderately reduced, mod-severe MR, mod TR, mild-mod AI, no AS. Chest CT (Pending): TBD PFT (Pending): TBD  Past Medical and Surgical History: Previous Chest Surgery: No Previous Chest Radiation: No Diabetes Mellitus: No.   HbA1C N/A Anticoagulation: Eliquis , Last dose - currently taking  Creatinine:  Lab Results  Component Value Date   CREATININE 2.03 (H) 09/01/2024   CREATININE 1.88 (H) 08/31/2024   CREATININE 1.72 (H) 08/30/2024     Past Medical History:  Diagnosis Date   Atrial fibrillation (HCC)    CHF (congestive heart failure) (HCC)    Cirrhosis (HCC)    ED (erectile dysfunction)    Hyperglycemia    Hyperlipidemia    Hypertension    Internal hemorrhoids    Myocardial infarction (HCC) 2008   SCCA (squamous cell carcinoma) of skin    Left ear.  MOHS surgery 2014 [facility]   Skin cancer of face    2012 basal cell   Thyroid  disease     Past Surgical History:  Procedure Laterality Date   A-Fib Cardiomyopathy [facility])  10/2006   EF 25 - 35%   DOPPLER ECHOCARDIOGRAPHY  10/31/2006   EF 25-35% Mod. M.R., mild -mod TR   RIGHT/LEFT HEART CATH AND CORONARY ANGIOGRAPHY Bilateral 08/31/2023   Procedure: RIGHT/LEFT HEART CATH AND CORONARY ANGIOGRAPHY;  Surgeon: [name] Bruckner, MD;  Location: [facility] INVASIVE CV LAB;  Service: Cardiovascular;  Laterality: Bilateral;    Social History:  Social History   Tobacco Use  Smoking Status Former   Current packs/day: 0.00   Average packs/day: 0.3 packs/day for 1 year (0.3  ttl pk-yrs)   Types: Cigarettes   Quit date: 02/26/1966   Years since quitting: 58.5  Smokeless Tobacco Never  Tobacco Comments   Less than six months    Social History   Substance and Sexual Activity  Alcohol Use Not Currently   Comment: 3-4 glasses of wine per week.     No Known Allergies   Current Facility-Administered Medications  Medication Dose Route Frequency Provider Last Rate Last Admin   0.9 %  sodium chloride  infusion  250 mL Intravenous PRN Marcine, Brittainy M, PA-C       acetaminophen  (TYLENOL ) tablet 650 mg  650 mg Oral Q6H PRN Howerter, Justin B, DO       Or   acetaminophen  (TYLENOL ) suppository 650 mg  650 mg Rectal Q6H PRN Howerter, Justin B, DO        amiodarone  (NEXTERONE  PREMIX) 360-4.14 MG/200ML-% (1.8 mg/mL) IV infusion  30 mg/hr Intravenous Continuous Marcine Catalan M, PA-C 16.67 mL/hr at 09/01/24 0901 30 mg/hr at 09/01/24 0901   apixaban  (ELIQUIS ) tablet 5 mg  5 mg Oral BID Howerter, Justin B, DO   5 mg at 08/31/24 2102   Chlorhexidine  Gluconate Cloth 2 % PADS 6 each  6 each Topical Daily Vernon Ranks, MD   6 each at 08/31/24 0849   fluticasone  furoate-vilanterol (BREO ELLIPTA ) 200-25 MCG/ACT 1 puff  1 puff Inhalation Daily Bensimhon, Toribio SAUNDERS, MD   1 puff at 09/01/24 0854   furosemide  (LASIX ) 120 mg in dextrose  5 % 50 mL IVPB  120 mg Intravenous BID Acharya, Gayatri A, MD 62 mL/hr at 09/01/24 0852 120 mg at 09/01/24 9147   levothyroxine  (SYNTHROID ) tablet 137 mcg  137 mcg Oral Q0600 Howerter, Justin B, DO   137 mcg at 08/31/24 9487   melatonin tablet 3 mg  3 mg Oral QHS PRN Howerter, Justin B, DO       milrinone  (PRIMACOR ) 20 MG/100 ML (0.2 mg/mL) infusion  0.25 mcg/kg/min Intravenous Continuous Marcine Catalan M, PA-C 5.29 mL/hr at 08/31/24 2111 0.25 mcg/kg/min at 08/31/24 2111   ondansetron  (ZOFRAN ) injection 4 mg  4 mg Intravenous Q6H PRN Howerter, Justin B, DO       potassium chloride  SA (KLOR-CON  M) CR tablet 40 mEq  40 mEq Oral Q4H Pahwani, Ravi, MD       pravastatin  (PRAVACHOL ) tablet 40 mg  40 mg Oral Daily Howerter, Justin B, DO   40 mg at 08/31/24 1004   sodium chloride  flush (NS) 0.9 % injection 10-40 mL  10-40 mL Intracatheter Q12H Pahwani, Ranks, MD   10 mL at 08/31/24 2102   sodium chloride  flush (NS) 0.9 % injection 10-40 mL  10-40 mL Intracatheter PRN Vernon Ranks, MD       sodium chloride  flush (NS) 0.9 % injection 3 mL  3 mL Intravenous Q12H Marcine Catalan M, PA-C   3 mL at 08/31/24 2102   sodium chloride  flush (NS) 0.9 % injection 3 mL  3 mL Intravenous PRN Simmons, Brittainy M, PA-C        Medications Prior to Admission  Medication Sig Dispense Refill Last Dose/Taking   albuterol  (VENTOLIN  HFA) 108 (90  Base) MCG/ACT inhaler Inhale 2 puffs into the lungs every 4 (four) hours as needed for wheezing or shortness of breath. 1 each 0 Unknown   amoxicillin -clavulanate (AUGMENTIN ) 875-125 MG tablet Take 1 tablet by mouth 2 (two) times daily.   08/27/2024 at  6:00 AM   apixaban  (ELIQUIS ) 5 MG TABS tablet Take 1 tablet  by mouth twice daily 180 tablet 1 08/27/2024 at  6:00 AM   carvedilol  (COREG ) 25 MG tablet TAKE 1 TABLET BY MOUTH TWICE DAILY WITH A MEAL 180 tablet 1 08/27/2024 at  6:00 AM   digoxin  (LANOXIN ) 0.125 MG tablet Take 0.125 mg by mouth daily.   08/27/2024 at  7:00 AM   doxazosin  (CARDURA ) 4 MG tablet Take 1 tablet (4 mg total) by mouth at bedtime. 90 tablet 3 08/26/2024 at 10:00 PM   empagliflozin  (JARDIANCE ) 10 MG TABS tablet Take 1 tablet (10 mg total) by mouth daily before breakfast. 90 tablet 3 08/27/2024 at  7:00 AM   ENTRESTO  24-26 MG Take 1 tablet by mouth 2 (two) times daily.   08/27/2024 at  7:00 AM   fish oil-omega-3 fatty acids 1000 MG capsule Take 1 g by mouth daily.   08/27/2024 at  7:00 AM   fluticasone -salmeterol (ADVAIR) 250-50 MCG/ACT AEPB INHALE 1 DOSE BY MOUTH IN THE MORNING AND AT BEDTIME RINSE MOUTH AFTER USE 180 each 3 Unknown   furosemide  (LASIX ) 20 MG tablet Take 1-2 tablets (20-40 mg total) by mouth daily. (Patient taking differently: Take 40 mg by mouth 3 (three) times a week.)   Past Month   furosemide  (LASIX ) 40 MG tablet Take 40 mg by mouth daily.   Unknown   levothyroxine  (SYNTHROID ) 137 MCG tablet Take 1 tablet (137 mcg total) by mouth daily before breakfast. 90 tablet 3 08/27/2024 at  6:00 AM   Melatonin 5 MG CAPS Take 5 mg by mouth daily.    08/26/2024 at 10:00 PM   Multiple Vitamin (MULTIVITAMIN) tablet Take 1 tablet by mouth daily.   08/27/2024 Morning   pravastatin  (PRAVACHOL ) 40 MG tablet Take 1 tablet (40 mg total) by mouth daily. (Patient taking differently: Take 40 mg by mouth at bedtime.) 90 tablet 3 08/26/2024 at 10:00 PM   predniSONE  (DELTASONE ) 10 MG  tablet Take 2 tablets (20 mg total) by mouth daily for 5 days. 10 tablet 0 08/27/2024 at  6:00 AM   sildenafil  (VIAGRA ) 100 MG tablet Take 1 tablet (100 mg total) by mouth daily as needed for erectile dysfunction. 10 tablet 12 Past Week   spironolactone  (ALDACTONE ) 25 MG tablet Take 1 tablet (25 mg total) by mouth daily. 90 tablet 3 08/27/2024 at  7:00 AM    Family History  Problem Relation Age of Onset   Hypertension Mother    Cancer Mother        Bilat. breast dz, s/p mastectomy, Bilat   Colon cancer Mother        possible   Colon polyps Mother    Heart disease Father        CHF, S/P CABG (70) Pacer Incr BP   Hyperlipidemia Father    Heart disease Brother 22       CAD- stent and ICD/pacer   Prostate cancer Neg Hx      Review of Systems:   ROS    Physical Exam: BP 117/78 (BP Location: Left Arm)   Pulse 67   Temp 98.2 F (36.8 C) (Oral)   Resp 20   Ht 5' 10 (1.778 m)   Wt 71.8 kg   SpO2 97%   BMI 22.70 kg/m  Physical Exam    Diagnostic Studies & Laboratory data: Cardiac Studies & Procedures   ______________________________________________________________________________________________ CARDIAC CATHETERIZATION  CARDIAC CATHETERIZATION 08/31/2023  Conclusion Conclusions: Mild to moderate, non-obstructive coronary artery disease, as detailed below. Mildly elevated left heart filling pressures. Moderately elevated  right heart and pulmonary artery pressures. Low normal Fick cardiac output/index.  Recommendations: Escalate diuresis. Titrate GDMT as tolerated.  Consider repeat echocardiogram once volume status and GDMT has been optimized.  Consider advanced heart failure consultation if LVEF remains severely reduced and/or significant mitral regurgitation is still present.  [name] Hanson, MD [facility]  Findings Coronary Findings Diagnostic  Dominance: Right  Left Main Vessel is large. Mid LM to Dist LM lesion is 20% stenosed.  Left Anterior  Descending Vessel is moderate in size. Prox LAD to Mid LAD lesion is 50% stenosed. The lesion is eccentric. The lesion is moderately calcified. Mid LAD lesion is 50% stenosed.  First Diagonal Branch Vessel is small in size.  Second Diagonal Branch Vessel is small in size.  Third Diagonal Branch Vessel is moderate in size.  Left Circumflex Vessel is moderate in size. Mid Cx lesion is 30% stenosed. The lesion is eccentric.  First Obtuse Marginal Branch Vessel is moderate in size.  Second Obtuse Marginal Branch Vessel is moderate in size.  Right Coronary Artery Vessel is large. Prox RCA lesion is 30% stenosed.  Right Ventricular Branch RV Branch lesion is 70% stenosed.  Right Posterior Descending Artery Vessel is moderate in size.  Right Posterior Atrioventricular Artery Vessel is moderate in size.  First Right Posterolateral Branch Vessel is small in size.  Second Right Posterolateral Branch Vessel is small in size.  Third Right Posterolateral Branch Vessel is small in size.  Intervention  No interventions have been documented.     ECHOCARDIOGRAM  ECHOCARDIOGRAM COMPLETE 08/28/2024  Narrative ECHOCARDIOGRAM REPORT    Patient Name:   [name] [name] Date of Exam: 08/28/2024 Medical Rec #:  [id number]    Height:       70.0 in Accession #:    [id number]   Weight:       162.1 lb Date of Birth:  09-Jun-1946   BSA:          1.909 m Patient Age:    78 years     BP:           116/83 mmHg Patient Gender: M            HR:           92 bpm. Exam Location:  Inpatient  Procedure: 2D Echo, Cardiac Doppler and Color Doppler (Both Spectral and Color Flow Doppler were utilized during procedure).  Indications:    CHF  History:        Patient has prior history of Echocardiogram examinations, most recent 11/16/2023. CHF, Arrythmias:Atrial Fibrillation, Signs/Symptoms:Shortness of Breath; Risk Factors:Dyslipidemia.  Sonographer:    Sherlean Dubin Referring Phys:  [id number] JUSTIN B HOWERTER  IMPRESSIONS   1. Left ventricular ejection fraction, by estimation, is <20%. The left ventricle has severely decreased function. The left ventricle demonstrates global hypokinesis. The left ventricular internal cavity size was severely dilated. Left ventricular diastolic parameters are indeterminate. 2. Right ventricular systolic function was not well visualized. The right ventricular size is not well visualized. 3. Left atrial size was severely dilated. 4. Right atrial size was severely dilated. 5. A small pericardial effusion is present. 6. The mitral valve is abnormal. Not well visualized mitral valve regurgitation. 7. Tricuspid valve regurgitation not well visualized. 8. The aortic valve was not well visualized. There is mild calcification of the aortic valve. Aortic valve regurgitation is mild.  Comparison(s): Changes from prior study are noted. LV dimension larger, EF worse compared to prior.  Conclusion(s)/Recommendation(s): Technically challenging study. Valvular  regurgitation not well visualized on current study. Noted to have prior severe mitral regurgitation.  FINDINGS Left Ventricle: Left ventricular ejection fraction, by estimation, is <20%. The left ventricle has severely decreased function. The left ventricle demonstrates global hypokinesis. The left ventricular internal cavity size was severely dilated. There is no left ventricular hypertrophy. Left ventricular diastolic parameters are indeterminate.  Right Ventricle: The right ventricular size is not well visualized. Right vetricular wall thickness was not well visualized. Right ventricular systolic function was not well visualized.  Left Atrium: Left atrial size was severely dilated.  Right Atrium: Right atrial size was severely dilated.  Pericardium: A small pericardial effusion is present.  Mitral Valve: The mitral valve is abnormal. Not well visualized mitral valve  regurgitation.  Tricuspid Valve: The tricuspid valve is not well visualized. Tricuspid valve regurgitation not well visualized.  Aortic Valve: The aortic valve was not well visualized. There is mild calcification of the aortic valve. Aortic valve regurgitation is mild.  Pulmonic Valve: The pulmonic valve was not well visualized. Pulmonic valve regurgitation is trivial. No evidence of pulmonic stenosis.  Aorta: The aortic root and ascending aorta are structurally normal, with no evidence of dilitation.  IAS/Shunts: The interatrial septum was not well visualized.   LEFT VENTRICLE PLAX 2D LVIDd:         8.56 cm   Diastology LVIDs:         7.71 cm   LV e' medial:    6.64 cm/s LV PW:         0.49 cm   LV E/e' medial:  12.6 LV IVS:        0.80 cm   LV e' lateral:   12.20 cm/s LVOT diam:     2.00 cm   LV E/e' lateral: 6.9 LV SV:         36 LV SV Index:   19 LVOT Area:     3.14 cm   RIGHT VENTRICLE RV Basal diam:  3.50 cm  PULMONARY VEINS RV Mid diam:    2.80 cm  Diastolic Velocity: 22.10 cm/s S/D Velocity:       1.30 Systolic Velocity:  27.70 cm/s  LEFT ATRIUM             Index        RIGHT ATRIUM           Index LA diam:        4.90 cm 2.57 cm/m   RA Area:     28.00 cm LA Vol (A2C):   66.9 ml 35.04 ml/m  RA Volume:   82.10 ml  43.01 ml/m LA Vol (A4C):   80.0 ml 41.91 ml/m LA Biplane Vol: 77.0 ml 40.33 ml/m AORTIC VALVE LVOT Vmax:   70.20 cm/s LVOT Vmean:  47.950 cm/s LVOT VTI:    0.116 m  AORTA Ao Root diam: 3.30 cm Ao Asc diam:  3.40 cm  MITRAL VALVE MV Area (PHT): 6.32 cm    SHUNTS MV Decel Time: 120 msec    Systemic VTI:  0.12 m MV E velocity: 83.90 cm/s  Systemic Diam: 2.00 cm  [name] Bruckner MD Electronically signed by [name] Bruckner MD Signature Date/Time: 08/28/2024/3:02:04 PM    Final          ______________________________________________________________________________________________     I have independently reviewed the  above radiologic studies and discussed with the patient   Recent Lab Findings: Lab Results  Component Value Date   WBC 10.4 09/01/2024   HGB 13.7 09/01/2024  HCT 38.2 (L) 09/01/2024   PLT 134 (L) 09/01/2024   GLUCOSE 204 (H) 09/01/2024   CHOL 160 09/01/2024   TRIG 70 09/01/2024   HDL 44 09/01/2024   LDLDIRECT 135.7 01/15/2009   LDLCALC 102 (H) 09/01/2024   ALT 16 09/01/2024   AST 18 09/01/2024   NA 131 (L) 09/01/2024   K 3.3 (L) 09/01/2024   CL 88 (L) 09/01/2024   CREATININE 2.03 (H) 09/01/2024   BUN 50 (H) 09/01/2024   CO2 29 09/01/2024   TSH 1.878 08/27/2024   INR 1.1 08/28/2024   HGBA1C 5.7 (H) 09/01/2024      Assessment / Plan:   [name] [name] is a 78 year old man with history of NICM and end-stage heart failure.  He was admitted recently for decompensated heart failure and has improved with diuresis and milrinone .  He is now undergoing evaluation for LVAD although is unsure if he would want one.  I discussed the general nature of the procedure, including the need for general anesthesia, the incisions to be used, the use of cardiopulmonary bypass, and the use of temporary pacemaker wires and drainage tubes postoperatively with the patient.  We discussed the expected hospital stay, overall recovery and short and long term outcomes. I informed him of the indications, risks, benefits and alternatives.   He understands the risks include, but are not limited to death, stroke, MI, DVT/PE, bleeding, possible need for transfusion, infections, cardiac arrhythmias, as well as other organ system dysfunction including respiratory (eg: prolonged ventilation), renal, or GI complications.   He still needs RHC, CT of head (history of melanoma), CT of chest/abd/pelvis and PFTs.  He also had a positive cologuard last year but says his last colonoscopy about 5 years ago was unremarkable.    He has a history of cirrhosis on imaging but does not seem to have functional liver disease based on his  labs.    Recommendation on appropriateness for LVAD pending further workup and MRB discussion.  I  spent 30 minutes counseling the patient face to face.   Con RAMAN Harkirat Orozco 09/01/2024 9:27 AM

## 2024-09-01 NOTE — Plan of Care (Signed)

## 2024-09-01 NOTE — Progress Notes (Signed)
 PROGRESS NOTE    [name] [name]  FMW:[id number] DOB: 1946-01-14 DOA: 08/27/2024 PCP: Cleatus Arlyss RAMAN, MD   Brief Narrative:  78 y.o. male with medical history significant for chronic biventricular systolic heart failure, paroxysmal atrial fibrillation chronically anticoagulated on Eliquis , essential hypertension, acquired hypothyroidism, who is admitted to [facility] on 08/27/2024 with acute on chronic systolic heart failure after presenting from home to [facility] ED complaining of shortness of breath of 2 to 3 days duration.  Associated with productive cough, lower extremity edema.  Of note patient was recently seen at urgent care for the above symptoms and there was some suspicion for underlying pneumonia.  He was discharged on prednisone , p.o. antibiotics Augmentin  and as needed albuterol  inhaler.  Patient symptoms did not improve hence EMS was summoned and he was found to have oxygen saturations of 87% on room air.     Patient was transported to the ER.  He was afebrile; in atrial fibrillation his initial heart rates were noted to be in the 160s, which is slightly improved into the range of the 90s to 120s following initiation of IV diuresis efforts; systolic blood pressures been in the 120s to 150s; respiratory rate 15-27; 93 to 100% on room air. Chest x-ray was concerning for cardiomegaly with increased pulmonary vascular congestion in the absence of infiltrate, pleural effusion and pneumothorax.  EKG showed atrial fibrillation with a heart rate of 96.  No ST-T wave changes noted.  The setting of acute on chronic systolic heart failure he was placed on BiPAP however he declined patient also received Lasix  60 mg,, Xopenex  nebulizer x 1, Lopressor  5 mg IV x 1 dose.    Assessment & Plan:   Principal Problem:   Acute on chronic systolic heart failure (HCC) Active Problems:   Acquired hypothyroidism   SOB (shortness of breath)   Paroxysmal atrial fibrillation with RVR (HCC)   Hyperkalemia    Lactic acidosis   High anion gap metabolic acidosis   HLD (hyperlipidemia)  Acute on chronic systolic heart failure - Presented with dyspnea of 2 to 3 days duration. - Chest x-ray concerning for cardiomegaly and pulmonary vascular congestion. - Elevated BNP-1,633 ? 1,952  - Last echo reviewed which showed an EF of 25 to 30%. Repeat echo 08/28/2024 shows<20% EF.  Off Coreg  with low output and holding digoxin  given elevated level and AKI. -Holding Jardiance , Entresto  24-26 mg BID, spironolactone  25 mg daily due to soft BP.  Cardiology titrating medications.  Patient has now been started on milrinone  and co-ox is improving.  Plan for right heart cath today.  Atrial fibrillation with RVR Had brief episode of RVR which resolved itself.  Cardiology concerned about sudden onset wide-complex tachycardia and VT, EP team consulted due to having reduced ejection fraction, per them, she does not have VT but instead she has A-fib.  EP discussed ICD indications with patient for primary prevention of SCD however patient not interested in this at this point in time.  Digoxin  on hold due to elevated levels, Coreg  on hold due to low blood pressure, patient currently on IV amiodarone .  Cardiology managing.   AKI on CKD stage IIIa ruled in:  Patient's creatinine at baseline appears to be around 1.3-1.4, creatinine rose and peaked at 1.94 on 08-29-24 but worse today at 2.03.  Continue to avoid nephrotoxic agents.  Hypothyroidism Continue Synthroid .  TSH within normal limit   Hyperlipidemia Continue pravastatin .  Hyponatremia: Due to CHF.  Stable  Hypokalemia: Low again will replenish.  Chronic thrombocytopenia: Stable.  DVT prophylaxis: SCDs Start: 08/27/24 2138   Code Status: Full Code  Family Communication: Wife and other family members present at bedside.  Plan of care discussed with patient in length and he/she verbalized understanding and agreed with it.  Status is: Inpatient Remains inpatient  appropriate because: Needs LVAD   Estimated body mass index is 22.7 kg/m as calculated from the following:   Height as of this encounter: 5' 10 (1.778 m).   Weight as of this encounter: 71.8 kg.    Nutritional Assessment: Body mass index is 22.7 kg/m.[name] Seen by dietician.  I agree with the assessment and plan as outlined below: Nutrition Status:        . Skin Assessment: I have examined the patient's skin and I agree with the wound assessment as performed by the wound care RN as outlined below:    Consultants:  Cardiology and CTS  Procedures:  As above  Antimicrobials:  Anti-infectives (From admission, onward)    None         Subjective: Seen and examined, has no complaints today.  Objective: Vitals:   08/31/24 2020 09/01/24 0000 09/01/24 0441 09/01/24 0449  BP: 117/78     Pulse: 67     Resp: 20   20  Temp: 97.7 F (36.5 C) 97.7 F (36.5 C)  98.2 F (36.8 C)  TempSrc: Oral   Oral  SpO2: 97%     Weight:   71.8 kg   Height:        Intake/Output Summary (Last 24 hours) at 09/01/2024 0828 Last data filed at 08/31/2024 2112 Gross per 24 hour  Intake 717 ml  Output 800 ml  Net -83 ml   Filed Weights   08/30/24 0412 08/31/24 0300 09/01/24 0441  Weight: 70.5 kg 70.4 kg 71.8 kg    Examination:  General exam: Appears calm and comfortable  Respiratory system: Clear to auscultation. Respiratory effort normal. Cardiovascular system: S1 & S2 heard, RRR. No JVD, murmurs, rubs, gallops or clicks. No pedal edema. Gastrointestinal system: Abdomen is nondistended, soft and nontender. No organomegaly or masses felt. Normal bowel sounds heard. Central nervous system: Alert and oriented. No focal neurological deficits. Extremities: Symmetric 5 x 5 power. Skin: No rashes, lesions or ulcers Psychiatry: Judgement and insight appear normal. Mood & affect appropriate.    Data Reviewed: I have personally reviewed following labs and imaging studies  CBC: Recent  Labs  Lab 08/28/24 0204 08/29/24 0920 08/30/24 0648 08/31/24 0454 09/01/24 0531  WBC 11.6* 9.5 12.1* 10.2 10.4  NEUTROABS 9.9* 6.7 8.4* 7.1 6.5  HGB 15.6 15.6 15.4 14.3 13.7  HCT 44.9 44.1 43.8 39.8 38.2*  MCV 94.3 92.8 93.6 91.7 91.0  PLT 140* 113* 120* 111* 134*   Basic Metabolic Panel: Recent Labs  Lab 08/27/24 1907 08/27/24 2349 08/28/24 0204 08/29/24 0920 08/30/24 0648 08/30/24 0753 08/31/24 0454 09/01/24 0531  NA 134*   < > 140 134* 138  --  133* 131*  K 5.5*   < > 4.6 3.5 3.3*  --  3.1* 3.3*  CL 98   < > 97* 93* 89*  --  92* 88*  CO2 19*   < > 26 32 34*  --  33* 29  GLUCOSE 189*   < > 124* 145* 107*  --  127* 204*  BUN 16   < > 18 35* 37*  --  50* 50*  CREATININE 1.49*   < > 1.54* 1.94* 1.72*  --  1.88* 2.03*  CALCIUM 9.2   < > 9.4 8.8* 8.7*  --  8.4* 8.2*  MG 2.9*  --  2.5*  --   --  1.9 2.4 2.1  PHOS  --   --  5.3*  --   --   --   --   --    < > = values in this interval not displayed.   GFR: Estimated Creatinine Clearance: 30.5 mL/min (A) (by C-G formula based on SCr of 2.03 mg/dL (H)). Liver Function Tests: Recent Labs  Lab 08/28/24 0204 08/29/24 0920 08/30/24 0648 08/31/24 0454 09/01/24 0531  AST 18 24 23 23 18   ALT 19 20 19 18 16   ALKPHOS 69 59 56 51 54  BILITOT 2.4* 1.8* 2.0* 1.5* 1.0  PROT 7.5 7.3 7.0 6.7 6.5  ALBUMIN 4.2 4.0 4.0 3.7 3.7   No results for input(s): LIPASE, AMYLASE in the last 168 hours. No results for input(s): AMMONIA in the last 168 hours. Coagulation Profile: Recent Labs  Lab 08/28/24 0204  INR 1.1   Cardiac Enzymes: Recent Labs  Lab 08/27/24 2349  CKTOTAL 47*   BNP (last 3 results) No results for input(s): PROBNP in the last 8760 hours. HbA1C: Recent Labs    09/01/24 0500  HGBA1C 5.7*   CBG: No results for input(s): GLUCAP in the last 168 hours. Lipid Profile: Recent Labs    09/01/24 0500  CHOL 160  HDL 44  LDLCALC 102*  TRIG 70  CHOLHDL 3.6   Thyroid  Function Tests: Recent Labs     09/01/24 0531  FREET4 1.66*   Anemia Panel: No results for input(s): VITAMINB12, FOLATE, FERRITIN, TIBC, IRON, RETICCTPCT in the last 72 hours. Sepsis Labs: Recent Labs  Lab 08/27/24 2105 08/27/24 2349 08/28/24 0204 08/29/24 1515 08/29/24 1826  PROCALCITON  --  0.11  --   --   --   LATICACIDVEN 2.0*  --  1.3 1.2 1.3    Recent Results (from the past 240 hours)  Resp panel by RT-PCR (RSV, Flu A&B, Covid) Anterior Nasal Swab     Status: None   Collection Time: 08/27/24  7:07 PM   Specimen: Anterior Nasal Swab  Result Value Ref Range Status   SARS Coronavirus 2 by RT PCR NEGATIVE NEGATIVE Final   Influenza A by PCR NEGATIVE NEGATIVE Final   Influenza B by PCR NEGATIVE NEGATIVE Final    Comment: (NOTE) The Xpert Xpress SARS-CoV-2/FLU/RSV plus assay is intended as an aid in the diagnosis of influenza from Nasopharyngeal swab specimens and should not be used as a sole basis for treatment. Nasal washings and aspirates are unacceptable for Xpert Xpress SARS-CoV-2/FLU/RSV testing.  Fact Sheet for Patients: bloggercourse.com  Fact Sheet for Healthcare Providers: seriousbroker.it  This test is not yet approved or cleared by the [place]  FDA and has been authorized for detection and/or diagnosis of SARS-CoV-2 by FDA under an Emergency Use Authorization (EUA). This EUA will remain in effect (meaning this test can be used) for the duration of the COVID-19 declaration under Section 564(b)(1) of the Act, 21 U.S.C. section 360bbb-3(b)(1), unless the authorization is terminated or revoked.     Resp Syncytial Virus by PCR NEGATIVE NEGATIVE Final    Comment: (NOTE) Fact Sheet for Patients: bloggercourse.com  Fact Sheet for Healthcare Providers: seriousbroker.it  This test is not yet approved or cleared by the [place]  FDA and has been authorized for detection  and/or diagnosis of SARS-CoV-2 by FDA under an Emergency Use Authorization (EUA). This  EUA will remain in effect (meaning this test can be used) for the duration of the COVID-19 declaration under Section 564(b)(1) of the Act, 21 U.S.C. section 360bbb-3(b)(1), unless the authorization is terminated or revoked.  Performed at [facility], [address] [place], [place] [address]   Culture, blood (routine x 2)     Status: None (Preliminary result)   Collection Time: 08/27/24  7:15 PM   Specimen: BLOOD RIGHT HAND  Result Value Ref Range Status   Specimen Description BLOOD RIGHT HAND  Final   Special Requests   Final    BOTTLES DRAWN AEROBIC AND ANAEROBIC Blood Culture results may not be optimal due to an inadequate volume of blood received in culture bottles   Culture   Final    NO GROWTH 4 DAYS Performed at [facility], [address] [place], [place] [address]    Report Status PENDING  Incomplete  Culture, blood (routine x 2)     Status: None (Preliminary result)   Collection Time: 08/27/24  7:20 PM   Specimen: BLOOD RIGHT FOREARM  Result Value Ref Range Status   Specimen Description BLOOD RIGHT FOREARM  Final   Special Requests   Final    BOTTLES DRAWN AEROBIC AND ANAEROBIC Blood Culture adequate volume   Culture   Final    NO GROWTH 4 DAYS Performed at [facility], [address], [place], [place] [address]    Report Status PENDING  Incomplete     Radiology Studies: VAS US  LOWER EXTREMITY VENOUS (DVT) Result Date: 08/31/2024  Lower Venous DVT Study Patient Name:  [name] [name]  Date of Exam:   08/31/2024 Medical Rec #: [id number]     Accession #:    [id number] Date of Birth: 1946/04/24    Patient Gender: M Patient Age:   35 years Exam Location:  [facility] Procedure:      VAS US  LOWER EXTREMITY VENOUS (DVT) Referring Phys: TORIBIO BENSIMHON --------------------------------------------------------------------------------  Indications: SOB, and  Preoperative evaluation of a medical condition to rule out surgical contraindications (TAR required). Other Indications: A-fib, biventricular systolic heart failure. Anticoagulation: Eliquis . Comparison Study: No prior exam. Performing Technologist: Edilia Elden Appl  Examination Guidelines: A complete evaluation includes B-mode imaging, spectral Doppler, color Doppler, and power Doppler as needed of all accessible portions of each vessel. Bilateral testing is considered an integral part of a complete examination. Limited examinations for reoccurring indications may be performed as noted. The reflux portion of the exam is performed with the patient in reverse Trendelenburg.  +---------+---------------+---------+-----------+----------+--------------+ RIGHT    CompressibilityPhasicitySpontaneityPropertiesThrombus Aging +---------+---------------+---------+-----------+----------+--------------+ CFV      Full           Yes      Yes                                 +---------+---------------+---------+-----------+----------+--------------+ SFJ      Full           Yes      Yes                                 +---------+---------------+---------+-----------+----------+--------------+ FV Prox  Full                                                        +---------+---------------+---------+-----------+----------+--------------+  FV Mid   Full                                                        +---------+---------------+---------+-----------+----------+--------------+ FV DistalFull                                                        +---------+---------------+---------+-----------+----------+--------------+ PFV      Full                                                        +---------+---------------+---------+-----------+----------+--------------+ POP      Full           Yes      Yes                                  +---------+---------------+---------+-----------+----------+--------------+ PTV      Full                                                        +---------+---------------+---------+-----------+----------+--------------+ PERO     Full                                                        +---------+---------------+---------+-----------+----------+--------------+   +---------+---------------+---------+-----------+----------+--------------+ LEFT     CompressibilityPhasicitySpontaneityPropertiesThrombus Aging +---------+---------------+---------+-----------+----------+--------------+ CFV      Full           Yes      Yes                                 +---------+---------------+---------+-----------+----------+--------------+ SFJ      Full           Yes      Yes                                 +---------+---------------+---------+-----------+----------+--------------+ FV Prox  Full                                                        +---------+---------------+---------+-----------+----------+--------------+ FV Mid   Full                                                        +---------+---------------+---------+-----------+----------+--------------+  FV DistalFull                                                        +---------+---------------+---------+-----------+----------+--------------+ PFV      Full                                                        +---------+---------------+---------+-----------+----------+--------------+ POP      Full           Yes      Yes                                 +---------+---------------+---------+-----------+----------+--------------+ PTV      Full                                                        +---------+---------------+---------+-----------+----------+--------------+ PERO     Full                                                         +---------+---------------+---------+-----------+----------+--------------+     Summary: BILATERAL: - No evidence of deep vein thrombosis seen in the lower extremities, bilaterally. -No evidence of popliteal cyst, bilaterally.   *See table(s) above for measurements and observations. Electronically signed by Debby Robertson on 08/31/2024 at 8:04:16 PM.    Final    VAS US  DOPPLER PRE VAD Result Date: 08/31/2024 PERIOPERATIVE VASCULAR EVALUATION Patient Name:  [name] [name]  Date of Exam:   08/31/2024 Medical Rec #: [id number]     Accession #:    [id number] Date of Birth: [birth year]    Patient Gender: M Patient Age:   85 years Exam Location:  [facility] Procedure:      VAS US  DOPPLER PRE VAD Referring Phys: TORIBIO BENSIMHON --------------------------------------------------------------------------------  Indications:      VAD evaluation, biventricular systolic heart failure, A-fib,                   SOB. Risk Factors:     Hypertension, hyperlipidemia, no history of smoking. Comparison Study: No prior exam. Performing Technologist: Edilia Elden Appl  Examination Guidelines: A complete evaluation includes B-mode imaging, spectral Doppler, color Doppler, and power Doppler as needed of all accessible portions of each vessel. Bilateral testing is considered an integral part of a complete examination. Limited examinations for reoccurring indications may be performed as noted.  Right Carotid Findings: +----------+--------+--------+--------+-------------------------+--------+           PSV cm/sEDV cm/sStenosisDescribe                 Comments +----------+--------+--------+--------+-------------------------+--------+ CCA Prox  100     13                                                +----------+--------+--------+--------+-------------------------+--------+  CCA Distal71      17                                                +----------+--------+--------+--------+-------------------------+--------+  ICA Prox  79      15              heterogenous and calcific         +----------+--------+--------+--------+-------------------------+--------+ ICA Mid   85      22                                                +----------+--------+--------+--------+-------------------------+--------+ ICA Distal60      20                                                +----------+--------+--------+--------+-------------------------+--------+ ECA       94      12                                                +----------+--------+--------+--------+-------------------------+--------+ +----------+--------+-------+----------------+------------+           PSV cm/sEDV cmsDescribe        Arm Pressure +----------+--------+-------+----------------+------------+ Subclavian129            Multiphasic, WNL             +----------+--------+-------+----------------+------------+ +---------+--------+--+--------+--+---------+ VertebralPSV cm/s42EDV cm/s12Antegrade +---------+--------+--+--------+--+---------+ Left Carotid Findings: +----------+--------+--------+--------+-------------------------+--------+           PSV cm/sEDV cm/sStenosisDescribe                 Comments +----------+--------+--------+--------+-------------------------+--------+ CCA Prox  115     16                                                +----------+--------+--------+--------+-------------------------+--------+ CCA Distal89      19                                                +----------+--------+--------+--------+-------------------------+--------+ ICA Prox  85      21              heterogenous and calcific         +----------+--------+--------+--------+-------------------------+--------+ ICA Mid   64      14                                                +----------+--------+--------+--------+-------------------------+--------+ ICA Distal64      18                                                 +----------+--------+--------+--------+-------------------------+--------+  ECA       82      11                                                +----------+--------+--------+--------+-------------------------+--------+ +----------+--------+--------+----------------+------------+ SubclavianPSV cm/sEDV cm/sDescribe        Arm Pressure +----------+--------+--------+----------------+------------+           150     18      Multiphasic, TWO862          +----------+--------+--------+----------------+------------+ +---------+--------+--+--------+-+---------+ VertebralPSV cm/s29EDV cm/s7Antegrade +---------+--------+--+--------+-+---------+  ABI Findings: +---------+------------------+-----+---------+--------+ Right    Rt Pressure (mmHg)IndexWaveform Comment  +---------+------------------+-----+---------+--------+ Brachial                                 IV lines +---------+------------------+-----+---------+--------+ PTA      167               1.22 triphasic         +---------+------------------+-----+---------+--------+ DP       164               1.20 triphasic         +---------+------------------+-----+---------+--------+ [place] Oven                    Normal            +---------+------------------+-----+---------+--------+ +---------+------------------+-----+---------+-------+ Left     Lt Pressure (mmHg)IndexWaveform Comment +---------+------------------+-----+---------+-------+ Brachial 137                    triphasic        +---------+------------------+-----+---------+-------+ PTA      164               1.20 triphasic        +---------+------------------+-----+---------+-------+ DP       166               1.21 biphasic         +---------+------------------+-----+---------+-------+ [place] Toe109                    Normal           +---------+------------------+-----+---------+-------+  +-------+---------------+----------------+ ABI/TBIToday's ABI/TBIPrevious ABI/TBI +-------+---------------+----------------+ Right  1.22/ 0.91                      +-------+---------------+----------------+ Left   1.21/ 0.80                      +-------+---------------+----------------+  Summary: Right Carotid: Velocities in the right ICA are consistent with a 1-39% stenosis. Left Carotid: Velocities in the left ICA are consistent with a 1-39% stenosis. Vertebrals:  Bilateral vertebral arteries demonstrate antegrade flow. Subclavians: Normal flow hemodynamics were seen in bilateral subclavian              arteries.  *See table(s) above for measurements and observations. Right ABI: Resting right ankle-brachial index is within normal range. The right toe-brachial index is normal. Left ABI: Resting left ankle-brachial index is within normal range. The left toe-brachial index is normal.  Electronically signed by Debby Robertson on 08/31/2024 at 8:04:00 PM.    Final     Scheduled Meds:  apixaban   5 mg Oral BID   Chlorhexidine  Gluconate Cloth  6 each Topical Daily   fluticasone   furoate-vilanterol  1 puff Inhalation Daily   levothyroxine   137 mcg Oral Q0600   potassium chloride   40 mEq Oral Q4H   pravastatin   40 mg Oral Daily   sodium chloride  flush  10-40 mL Intracatheter Q12H   sodium chloride  flush  3 mL Intravenous Q12H   Continuous Infusions:  sodium chloride      amiodarone  30 mg/hr (08/31/24 2101)   furosemide  120 mg (08/31/24 1731)   milrinone  0.25 mcg/kg/min (08/31/24 2111)     LOS: 4 days   Fredia Skeeter, MD [facility]  09/01/2024, 8:28 AM   *Please note that this is a verbal dictation therefore any spelling or grammatical errors are due to the Dragon Medical One system interpretation.  Please page via Amion and do not message via secure chat for urgent patient care matters. Secure chat can be used for non urgent patient care matters.  How to contact the [facility]  Attending or Consulting provider 7A - 7P or covering provider during after hours 7P -7A, for this patient?  Check the care team in [facility] and look for a) attending/consulting [facility] provider listed and b) the [facility] team listed. Page or secure chat 7A-7P. Log into [email or web address] and use 's universal password to access. If you do not have the password, please contact the hospital operator. Locate the [facility] provider you are looking for under [facility] and page to a number that you can be directly reached. If you still have difficulty reaching the provider, please page the [facility] (Director on Call) for the Hospitalists listed on amion for assistance.

## 2024-09-01 NOTE — H&P (View-Only) (Signed)
 Advanced Heart Failure Rounding Note  Cardiologist: [name], MD  AHF: Dr. Zenaida  Chief Complaint: A/c Systolic HF Patient Profile  78 y/o male w/ chronic systolic heart failure w/ biventricular dysfunction d/t mixed ischemic/nonischemic CM, CAD, severe functional MR and permanent atrial fibrillation admitted w/ a/c CHF w/ NYHA Class IV symptoms. Now w/ low output. Initial Co-ox 47%. Started on Milrinone .  Subjective:   Co-ox 66% on 0.25 of milrinone .   Lasix  120 IV BID. -1.1L UOP. CVP unhooked.   Scr 1.88 -> 2.03  Wife at bedside. Denies CP/SOB.   Objective:   Weight Range: 71.8 kg Body mass index is 22.7 kg/m.   Vital Signs:   Temp:  [97.7 F (36.5 C)-98.2 F (36.8 C)] 98.2 F (36.8 C) (12/05 0449) Pulse Rate:  [67-95] 67 (12/04 2020) Resp:  [18-20] 20 (12/05 0449) BP: (104-129)/(65-78) 117/78 (12/04 2020) SpO2:  [94 %-97 %] 97 % (12/04 2020) Weight:  [71.8 kg] 71.8 kg (12/05 0441) Last BM Date : 08/30/24  Weight change: Filed Weights   08/30/24 0412 08/31/24 0300 09/01/24 0441  Weight: 70.5 kg 70.4 kg 71.8 kg    Intake/Output:   Intake/Output Summary (Last 24 hours) at 09/01/2024 0708 Last data filed at 08/31/2024 2112 Gross per 24 hour  Intake 717 ml  Output 1100 ml  Net -383 ml    Physical Exam  CVP unhooked General:  elderly appearing.  No respiratory difficulty Neck: JVD ~9 cm.  Cor: irregular rhythm. No murmurs. Lungs: clear Extremities: no edema  Neuro: alert & oriented x 3. Affect pleasant.   Telemetry   Afib 90s, 22 beat NSVT personally reviewed   Labs    CBC Recent Labs    08/31/24 0454 09/01/24 0531  WBC 10.2 10.4  NEUTROABS 7.1 6.5  HGB 14.3 13.7  HCT 39.8 38.2*  MCV 91.7 91.0  PLT 111* 134*   Basic Metabolic Panel Recent Labs    87/96/74 0648 08/30/24 0753 08/31/24 0454  NA 138  --  133*  K 3.3*  --  3.1*  CL 89*  --  92*  CO2 34*  --  33*  GLUCOSE 107*  --  127*  BUN 37*  --  50*  CREATININE 1.72*  --   1.88*  CALCIUM 8.7*  --  8.4*  MG  --  1.9 2.4   Liver Function Tests Recent Labs    08/30/24 0648 08/31/24 0454  AST 23 23  ALT 19 18  ALKPHOS 56 51  BILITOT 2.0* 1.5*  PROT 7.0 6.7  ALBUMIN 4.0 3.7   No results for input(s): LIPASE, AMYLASE in the last 72 hours. Cardiac Enzymes No results for input(s): CKTOTAL, CKMB, CKMBINDEX, TROPONINI in the last 72 hours.   BNP: BNP (last 3 results) Recent Labs    08/27/24 1907 08/28/24 0204  BNP 1,633.9* 1,952.5*    ProBNP (last 3 results) No results for input(s): PROBNP in the last 8760 hours.   D-Dimer No results for input(s): DDIMER in the last 72 hours. Hemoglobin A1C Recent Labs    09/01/24 0500  HGBA1C 5.7*   Fasting Lipid Panel No results for input(s): CHOL, HDL, LDLCALC, TRIG, CHOLHDL, LDLDIRECT in the last 72 hours. Thyroid  Function Tests No results for input(s): TSH, T4TOTAL, T3FREE, THYROIDAB in the last 72 hours.  Invalid input(s): FREET3   Other results:   Imaging    VAS US  LOWER EXTREMITY VENOUS (DVT) Result Date: 08/31/2024  Lower Venous DVT Study Patient Name:  [name]  W [name]  Date of Exam:   08/31/2024 Medical Rec #: [id number]     Accession #:    [id number] Date of Birth: 1946-02-14    Patient Gender: M Patient Age:   51 years Exam [facility] Procedure:      VAS US  LOWER EXTREMITY VENOUS (DVT) Referring Phys: TORIBIO Mehak Roskelley --------------------------------------------------------------------------------  Indications: SOB, and Preoperative evaluation of a medical condition to rule out surgical contraindications (TAR required). Other Indications: A-fib, biventricular systolic heart failure. Anticoagulation: Eliquis . Comparison Study: No prior exam. Performing Technologist: Edilia Elden Appl  Examination Guidelines: A complete evaluation includes B-mode imaging, spectral Doppler, color Doppler, and power Doppler as needed of all accessible portions  of each vessel. Bilateral testing is considered an integral part of a complete examination. Limited examinations for reoccurring indications may be performed as noted. The reflux portion of the exam is performed with the patient in reverse Trendelenburg.  +---------+---------------+---------+-----------+----------+--------------+ RIGHT    CompressibilityPhasicitySpontaneityPropertiesThrombus Aging +---------+---------------+---------+-----------+----------+--------------+ CFV      Full           Yes      Yes                                 +---------+---------------+---------+-----------+----------+--------------+ SFJ      Full           Yes      Yes                                 +---------+---------------+---------+-----------+----------+--------------+ FV Prox  Full                                                        +---------+---------------+---------+-----------+----------+--------------+ FV Mid   Full                                                        +---------+---------------+---------+-----------+----------+--------------+ FV DistalFull                                                        +---------+---------------+---------+-----------+----------+--------------+ PFV      Full                                                        +---------+---------------+---------+-----------+----------+--------------+ POP      Full           Yes      Yes                                 +---------+---------------+---------+-----------+----------+--------------+ PTV      Full                                                        +---------+---------------+---------+-----------+----------+--------------+  PERO     Full                                                        +---------+---------------+---------+-----------+----------+--------------+   +---------+---------------+---------+-----------+----------+--------------+ LEFT      CompressibilityPhasicitySpontaneityPropertiesThrombus Aging +---------+---------------+---------+-----------+----------+--------------+ CFV      Full           Yes      Yes                                 +---------+---------------+---------+-----------+----------+--------------+ SFJ      Full           Yes      Yes                                 +---------+---------------+---------+-----------+----------+--------------+ FV Prox  Full                                                        +---------+---------------+---------+-----------+----------+--------------+ FV Mid   Full                                                        +---------+---------------+---------+-----------+----------+--------------+ FV DistalFull                                                        +---------+---------------+---------+-----------+----------+--------------+ PFV      Full                                                        +---------+---------------+---------+-----------+----------+--------------+ POP      Full           Yes      Yes                                 +---------+---------------+---------+-----------+----------+--------------+ PTV      Full                                                        +---------+---------------+---------+-----------+----------+--------------+ PERO     Full                                                        +---------+---------------+---------+-----------+----------+--------------+  Summary: BILATERAL: - No evidence of deep vein thrombosis seen in the lower extremities, bilaterally. -No evidence of popliteal cyst, bilaterally.   *See table(s) above for measurements and observations. Electronically signed by Debby Robertson on 08/31/2024 at 8:04:16 PM.    Final    VAS US  DOPPLER PRE VAD Result Date: 08/31/2024 PERIOPERATIVE VASCULAR EVALUATION Patient Name:  [name] [name]  Date of Exam:   08/31/2024 Medical Rec  #: [id number]     Accession #:    [id number] Date of Birth: [birth year]    Patient Gender: M Patient Age:   27 years Exam Location:  [facility] Procedure:      VAS US  DOPPLER PRE VAD Referring Phys: TORIBIO Joniya Boberg --------------------------------------------------------------------------------  Indications:      VAD evaluation, biventricular systolic heart failure, A-fib,                   SOB. Risk Factors:     Hypertension, hyperlipidemia, no history of smoking. Comparison Study: No prior exam. Performing Technologist: Edilia Elden Appl  Examination Guidelines: A complete evaluation includes B-mode imaging, spectral Doppler, color Doppler, and power Doppler as needed of all accessible portions of each vessel. Bilateral testing is considered an integral part of a complete examination. Limited examinations for reoccurring indications may be performed as noted.  Right Carotid Findings: +----------+--------+--------+--------+-------------------------+--------+           PSV cm/sEDV cm/sStenosisDescribe                 Comments +----------+--------+--------+--------+-------------------------+--------+ CCA Prox  100     13                                                +----------+--------+--------+--------+-------------------------+--------+ CCA Distal71      17                                                +----------+--------+--------+--------+-------------------------+--------+ ICA Prox  79      15              heterogenous and calcific         +----------+--------+--------+--------+-------------------------+--------+ ICA Mid   85      22                                                +----------+--------+--------+--------+-------------------------+--------+ ICA Distal60      20                                                +----------+--------+--------+--------+-------------------------+--------+ ECA       94      12                                                 +----------+--------+--------+--------+-------------------------+--------+ +----------+--------+-------+----------------+------------+           PSV cm/sEDV  cmsDescribe        Arm Pressure +----------+--------+-------+----------------+------------+ Subclavian129            Multiphasic, WNL             +----------+--------+-------+----------------+------------+ +---------+--------+--+--------+--+---------+ VertebralPSV cm/s42EDV cm/s12Antegrade +---------+--------+--+--------+--+---------+ Left Carotid Findings: +----------+--------+--------+--------+-------------------------+--------+           PSV cm/sEDV cm/sStenosisDescribe                 Comments +----------+--------+--------+--------+-------------------------+--------+ CCA Prox  115     16                                                +----------+--------+--------+--------+-------------------------+--------+ CCA Distal89      19                                                +----------+--------+--------+--------+-------------------------+--------+ ICA Prox  85      21              heterogenous and calcific         +----------+--------+--------+--------+-------------------------+--------+ ICA Mid   64      14                                                +----------+--------+--------+--------+-------------------------+--------+ ICA Distal64      18                                                +----------+--------+--------+--------+-------------------------+--------+ ECA       82      11                                                +----------+--------+--------+--------+-------------------------+--------+ +----------+--------+--------+----------------+------------+ SubclavianPSV cm/sEDV cm/sDescribe        Arm Pressure +----------+--------+--------+----------------+------------+           150     18      Multiphasic, TWO862           +----------+--------+--------+----------------+------------+ +---------+--------+--+--------+-+---------+ VertebralPSV cm/s29EDV cm/s7Antegrade +---------+--------+--+--------+-+---------+  ABI Findings: +---------+------------------+-----+---------+--------+ Right    Rt Pressure (mmHg)IndexWaveform Comment  +---------+------------------+-----+---------+--------+ Brachial                                 IV lines +---------+------------------+-----+---------+--------+ PTA      167               1.22 triphasic         +---------+------------------+-----+---------+--------+ DP       164               1.20 triphasic         +---------+------------------+-----+---------+--------+ Burnetta Oven                    Normal            +---------+------------------+-----+---------+--------+ +---------+------------------+-----+---------+-------+  Left     Lt Pressure (mmHg)IndexWaveform Comment +---------+------------------+-----+---------+-------+ Brachial 137                    triphasic        +---------+------------------+-----+---------+-------+ PTA      164               1.20 triphasic        +---------+------------------+-----+---------+-------+ DP       166               1.21 biphasic         +---------+------------------+-----+---------+-------+ Great Toe109                    Normal           +---------+------------------+-----+---------+-------+ +-------+---------------+----------------+ ABI/TBIToday's ABI/TBIPrevious ABI/TBI +-------+---------------+----------------+ Right  1.22/ 0.91                      +-------+---------------+----------------+ Left   1.21/ 0.80                      +-------+---------------+----------------+  Summary: Right Carotid: Velocities in the right ICA are consistent with a 1-39% stenosis. Left Carotid: Velocities in the left ICA are consistent with a 1-39% stenosis. Vertebrals:  Bilateral vertebral arteries  demonstrate antegrade flow. Subclavians: Normal flow hemodynamics were seen in bilateral subclavian              arteries.  *See table(s) above for measurements and observations. Right ABI: Resting right ankle-brachial index is within normal range. The right toe-brachial index is normal. Left ABI: Resting left ankle-brachial index is within normal range. The left toe-brachial index is normal.  Electronically signed by Debby Robertson on 08/31/2024 at 8:04:00 PM.    Final       Medications:     Scheduled Medications:  apixaban   5 mg Oral BID   Chlorhexidine  Gluconate Cloth  6 each Topical Daily   fluticasone  furoate-vilanterol  1 puff Inhalation Daily   levothyroxine   137 mcg Oral Q0600   pravastatin   40 mg Oral Daily   sodium chloride  flush  10-40 mL Intracatheter Q12H   sodium chloride  flush  3 mL Intravenous Q12H    Infusions:  sodium chloride      amiodarone  30 mg/hr (08/31/24 2101)   furosemide  120 mg (08/31/24 1731)   milrinone  0.25 mcg/kg/min (08/31/24 2111)    PRN Medications: sodium chloride , acetaminophen  **OR** acetaminophen , melatonin, ondansetron  (ZOFRAN ) IV, sodium chloride  flush, sodium chloride  flush    Assessment/Plan   1. Acute on Chronic Systolic Heart Failure w/ Low Output  - mixed ischemic/idiopathic CM  - Echo 10/2023: LVEF 25 to 30%, moderate to severely dilated, mildly reduced RV function - NYHA IV on admit - Echo this admit EF <20%, RV not well visualized  - Started on Milrinone  for low co-ox (47%) - Remains on Milrinone  0.25. Co-ox improved, 66%.  - CVP unhooked, breathing stable. Will hold off on further diuretics post cath.  - off Coreg  w/ low output - holding digoxin  given elevated level and AKI. Can restart later if Cr improves - concerned he is nearing need for advanced therapies/LVAD.  - RHC today.  Informed Consent   Shared Decision Making/Informed Consent The risks, including but not limited to, [bleeding or vascular complications (1 in 500),  pneumothorax (1 in 1600), arrhythmia (1 in 1000) and death (1 in 5000)], benefits (diagnostic support and/or management of heart failure, pulmonary hypertension) and  alternatives of a right heart catheterization were discussed in detail with Mr. Detweiler and he is willing to proceed.       2. Chronic Afib  - V-rates 90s-110s  - continue IV amio @ 30/hr while on milrinone   - continue Eliquis     3. Severe MR - functional  - LV cavity more dilated on this admission echo in setting of a/c CHF exacerbation, LVIDs 7.7 cm (6.40 cm) - HF optimization per above   4. VT - in setting of worsening HF, 17 beat run tele 12/2 - keep K > 4.0 and Mg > 2.0 - cont IV amio  - he previously declined ICD but may reconsider   5. CAD - LHC 08/31/2023: LMCA with 20% distal stenosis. LAD with sequential 50% proximal and mid lesions. LCx with 30% mid stenosis, and proximal/mid RCA with 30% disease and 70% stenosis at the ostium of AM1  - denies CP  - continue statin - no ASA given need for Eliquis   6. AKI - b/l SCr 1.4 - bumped to 1.9 w/ diuresis + low output - continue milrinone  + diuresis - follow BMP   7. Hypokalemia - Pending CMET - give K supp w/ diuresis, d/w pharmD  - add spiro if/when SCr stablizes   8. H/o Malignant Melanoma - occipital scalp, s/p excision @ [facility] in 2023 - never followed up for surveillance head CTs  - may need to revisit prior to potential VAD    Length of Stay: 4  Beckey LITTIE Coe, NP  09/01/2024, 7:08 AM  Advanced Heart Failure Team Pager [phone] (M-F; 7a - 5p)  Please contact [facility] for night-coverage after hours (5p -7a ) and weekends on [email or web address]  Patient seen and examined with the above-signed Advanced Practice Provider and/or Housestaff. I personally reviewed laboratory data, imaging studies and relevant notes. I independently examined the patient and formulated the important aspects of the plan. I have edited the note to reflect any of my changes or salient  points. I have personally discussed the plan with the patient and/or family.  Remains on milrinone . Has diuresed well. Co-ox improved. Scr 1.88-> 2.03  General:  Sitting up on side of bed. No resp difficulty HEENT: normal Neck: supple.JVP 7 Cor: Irregular rate & rhythm. No rubs, gallops or murmurs. Lungs: clear Abdomen: soft, nontender, nondistended.Good bowel sounds. Extremities: no cyanosis, clubbing, rash, edema Neuro: alert & orientedx3, cranial nerves grossly intact. moves all 4 extremities w/o difficulty. Affect pleasant  Hemodynamically improved on milrinone  but Scr up. VAD w/u ongoing. He remains uncertain if he wants to proceed. Will plan RHC today.   Will need Scr to be < 1.7 to proceed.   Await PFTs and scans  Toribio Fuel, MD  11:21 AM

## 2024-09-01 NOTE — Progress Notes (Signed)
   09/01/24 2100  BiPAP/CPAP/SIPAP  Reason BIPAP/CPAP not in use Non-compliant (Pt. refused CPAP. Educated pt. on why to wear it.)

## 2024-09-02 DIAGNOSIS — I5023 Acute on chronic systolic (congestive) heart failure: Secondary | ICD-10-CM | POA: Diagnosis not present

## 2024-09-02 LAB — CBC WITH DIFFERENTIAL/PLATELET
Abs Immature Granulocytes: 0.12 K/uL — ABNORMAL HIGH (ref 0.00–0.07)
Basophils Absolute: 0.1 K/uL (ref 0.0–0.1)
Basophils Relative: 1 %
Eosinophils Absolute: 0.3 K/uL (ref 0.0–0.5)
Eosinophils Relative: 3 %
HCT: 37.9 % — ABNORMAL LOW (ref 39.0–52.0)
Hemoglobin: 13.6 g/dL (ref 13.0–17.0)
Immature Granulocytes: 1 %
Lymphocytes Relative: 20 %
Lymphs Abs: 2 K/uL (ref 0.7–4.0)
MCH: 32.5 pg (ref 26.0–34.0)
MCHC: 35.9 g/dL (ref 30.0–36.0)
MCV: 90.7 fL (ref 80.0–100.0)
Monocytes Absolute: 1 K/uL (ref 0.1–1.0)
Monocytes Relative: 10 %
Neutro Abs: 6.7 K/uL (ref 1.7–7.7)
Neutrophils Relative %: 65 %
Platelets: 151 K/uL (ref 150–400)
RBC: 4.18 MIL/uL — ABNORMAL LOW (ref 4.22–5.81)
RDW: 12.7 % (ref 11.5–15.5)
WBC: 10.2 K/uL (ref 4.0–10.5)
nRBC: 0 % (ref 0.0–0.2)

## 2024-09-02 LAB — COOXEMETRY PANEL
Carboxyhemoglobin: 1 % (ref 0.5–1.5)
Methemoglobin: <0.7 % (ref 0.0–1.5)
O2 Saturation: 57.9 %
Total hemoglobin: 14.5 g/dL (ref 12.0–16.0)

## 2024-09-02 LAB — COMPREHENSIVE METABOLIC PANEL WITH GFR
ALT: 17 U/L (ref 0–44)
AST: 18 U/L (ref 15–41)
Albumin: 4 g/dL (ref 3.5–5.0)
Alkaline Phosphatase: 54 U/L (ref 38–126)
Anion gap: 12 (ref 5–15)
BUN: 51 mg/dL — ABNORMAL HIGH (ref 8–23)
CO2: 28 mmol/L (ref 22–32)
Calcium: 8.3 mg/dL — ABNORMAL LOW (ref 8.9–10.3)
Chloride: 91 mmol/L — ABNORMAL LOW (ref 98–111)
Creatinine, Ser: 2.05 mg/dL — ABNORMAL HIGH (ref 0.61–1.24)
GFR, Estimated: 33 mL/min — ABNORMAL LOW (ref >60)
Glucose, Bld: 164 mg/dL — ABNORMAL HIGH (ref 70–99)
Potassium: 3.4 mmol/L — ABNORMAL LOW (ref 3.5–5.1)
Sodium: 131 mmol/L — ABNORMAL LOW (ref 135–145)
Total Bilirubin: 1.5 mg/dL — ABNORMAL HIGH (ref 0.0–1.2)
Total Protein: 6.9 g/dL (ref 6.5–8.1)

## 2024-09-02 LAB — HEPATITIS B SURFACE ANTIBODY, QUANTITATIVE: Hep B S AB Quant (Post): <3.5 m[IU]/mL — ABNORMAL LOW

## 2024-09-02 LAB — DRVVT MIX: dRVVT Mix: 68.3 s — ABNORMAL HIGH (ref 0.0–40.4)

## 2024-09-02 LAB — HEXAGONAL PHASE PHOSPHOLIPID: Hexagonal Phase Phospholipid: 10 s (ref 0–11)

## 2024-09-02 LAB — PTT-LA MIX: PTT-LA Mix: 45 s — ABNORMAL HIGH (ref 0.0–40.5)

## 2024-09-02 LAB — PSA, TOTAL AND FREE
PSA, Free Pct: 23 %
PSA, Free: 0.99 ng/mL
Prostate Specific Ag, Serum: 4.3 ng/mL — ABNORMAL HIGH (ref 0.0–4.0)

## 2024-09-02 LAB — DRVVT CONFIRM: dRVVT Confirm: 1.1 ratio (ref 0.8–1.2)

## 2024-09-02 LAB — LUPUS ANTICOAGULANT PANEL
DRVVT: 141 s — ABNORMAL HIGH (ref 0.0–47.0)
PTT Lupus Anticoagulant: 53.4 s — ABNORMAL HIGH (ref 0.0–43.5)

## 2024-09-02 LAB — HEPATITIS B CORE ANTIBODY, TOTAL: HEP B CORE AB: NEGATIVE

## 2024-09-02 LAB — T3, FREE: T3, Free: 2.3 pg/mL (ref 2.0–4.4)

## 2024-09-02 LAB — MAGNESIUM: Magnesium: 2.1 mg/dL (ref 1.7–2.4)

## 2024-09-02 MED ORDER — POTASSIUM CHLORIDE CRYS ER 20 MEQ PO TBCR
40.0000 meq | EXTENDED_RELEASE_TABLET | ORAL | Status: AC
  Administered 2024-09-02 (×2): 40 meq via ORAL
  Filled 2024-09-02 (×2): qty 2

## 2024-09-02 NOTE — Progress Notes (Signed)
 Patient ambulated to bathroom, developed Afib RVR @ 140-160, MD notified and patient back in bed with 4L nasal cannula applied.  VS documented.  HR decreased to 97-110. Will continue to monitor.

## 2024-09-02 NOTE — Progress Notes (Signed)
 PROGRESS NOTE    [name] [name]  FMW:[id number] DOB: May 11, 1946 DOA: 08/27/2024 PCP: Cleatus Arlyss RAMAN, MD   Brief Narrative:  78 y.o. male with medical history significant for chronic biventricular systolic heart failure, paroxysmal atrial fibrillation chronically anticoagulated on Eliquis , essential hypertension, acquired hypothyroidism, who is admitted to [facility] on 08/27/2024 with acute on chronic systolic heart failure after presenting from home to [facility] ED complaining of shortness of breath of 2 to 3 days duration.  Associated with productive cough, lower extremity edema.  Of note patient was recently seen at urgent care for the above symptoms and there was some suspicion for underlying pneumonia.  He was discharged on prednisone , p.o. antibiotics Augmentin  and as needed albuterol  inhaler.  Patient symptoms did not improve hence EMS was summoned and he was found to have oxygen saturations of 87% on room air.     Patient was transported to the ER.  He was afebrile; in atrial fibrillation his initial heart rates were noted to be in the 160s, respiratory rate 15-27; 93 to 100% on room air. Chest x-ray was concerning for cardiomegaly with increased pulmonary vascular congestion in the absence of infiltrate, pleural effusion and pneumothorax.  EKG showed atrial fibrillation with a heart rate of 96.  No ST-T wave changes noted.  Details of hospitalization below.    Assessment & Plan:   Principal Problem:   Acute on chronic systolic heart failure (HCC) Active Problems:   Acquired hypothyroidism   SOB (shortness of breath)   Paroxysmal atrial fibrillation with RVR (HCC)   Hyperkalemia   Lactic acidosis   High anion gap metabolic acidosis   HLD (hyperlipidemia)  Acute on chronic systolic heart failure - Chest x-ray concerning for cardiomegaly and pulmonary vascular congestion. - Elevated BNP-1,633 ? 1,952  - Last echo reviewed which showed an EF of 25 to 30%. Repeat echo 08/28/2024  shows<20% EF.  Off Coreg  with low output and holding digoxin  given elevated level and AKI. -Holding Jardiance , Entresto  24-26 mg BID, spironolactone  25 mg daily due to soft BP.  Cardiology titrating medications.  Patient has now been started on milrinone  and co-ox is improving.  Status post right heart cath 09/01/2024, was found to have well compensated hemodynamics on milrinone .  Cardiology recommended LVAD, CT surgery has seen him as well.  Patient has not made his decision about that yet.  Atrial fibrillation with RVR Had brief episode of RVR which resolved itself.  Cardiology concerned about sudden onset wide-complex tachycardia and VT, EP team consulted due to having reduced ejection fraction, per them, she does not have VT but instead she has A-fib.  EP discussed ICD indications with patient for primary prevention of SCD however patient not interested in this at this point in time.  Digoxin  on hold due to elevated levels, Coreg  on hold due to low blood pressure, patient currently on IV amiodarone  and Eliquis .  Cardiology managing.   AKI on CKD stage IIIa ruled in:  Patient's creatinine at baseline appears to be around 1.3-1.4, creatinine continues to rise and 2.05 today.  Continue to avoid nephrotoxic agents.  Hypothyroidism Continue Synthroid .  TSH within normal limit   Hyperlipidemia Continue pravastatin .  Hyponatremia: Due to CHF.  Stable  Hypokalemia: Low again will replenish again.  Magnesium  normal.  Chronic thrombocytopenia: Improved.  DVT prophylaxis: SCDs Start: 08/27/24 2138   Code Status: Full Code  Family Communication: Wife present at bedside.  Plan of care discussed with patient in length and he/she verbalized understanding  and agreed with it.  Status is: Inpatient Remains inpatient appropriate because: Needs LVAD   Estimated body mass index is 22.52 kg/m as calculated from the following:   Height as of this encounter: 5' 10 (1.778 m).   Weight as of this encounter:  71.2 kg.    Nutritional Assessment: Body mass index is 22.52 kg/m.[name] Seen by dietician.  I agree with the assessment and plan as outlined below: Nutrition Status: Nutrition Problem: Increased nutrient needs Etiology: acute illness Signs/Symptoms: estimated needs Interventions: Ensure Enlive (each supplement provides 350kcal and 20 grams of protein)  . Skin Assessment: I have examined the patient's skin and I agree with the wound assessment as performed by the wound care RN as outlined below:    Consultants:  Cardiology and CTS  Procedures:  As above  Antimicrobials:  Anti-infectives (From admission, onward)    None         Subjective: Patient seen and examined, wife at the bedside.  Patient has no complaints other than cough and sputum production.  Denies any shortness of breath or chest pain.  Lungs are clear to auscultation with no fever no leukocytosis, doubt pneumonia.  Objective: Vitals:   09/02/24 0450 09/02/24 0452 09/02/24 0742 09/02/24 0745  BP:  125/66    Pulse:  83 (!) 107   Resp:  18 18   Temp:  97.9 F (36.6 C) 97.8 F (36.6 C)   TempSrc:  Oral Oral   SpO2:  96% 97% 95%  Weight: 71.2 kg     Height:        Intake/Output Summary (Last 24 hours) at 09/02/2024 0843 Last data filed at 09/01/2024 1900 Gross per 24 hour  Intake 600 ml  Output 850 ml  Net -250 ml   Filed Weights   08/31/24 0300 09/01/24 0441 09/02/24 0450  Weight: 70.4 kg 71.8 kg 71.2 kg    Examination:  General exam: Appears calm and comfortable  Respiratory system: Clear to auscultation. Respiratory effort normal. Cardiovascular system: S1 & S2 heard, RRR. No JVD, murmurs, rubs, gallops or clicks. No pedal edema. Gastrointestinal system: Abdomen is nondistended, soft and nontender. No organomegaly or masses felt. Normal bowel sounds heard. Central nervous system: Alert and oriented. No focal neurological deficits. Extremities: Symmetric 5 x 5 power. Skin: No rashes, lesions  or ulcers.  Psychiatry: Judgement and insight appear normal. Mood & affect appropriate.   Data Reviewed: I have personally reviewed following labs and imaging studies  CBC: Recent Labs  Lab 08/29/24 0920 08/30/24 0648 08/31/24 0454 09/01/24 0531 09/01/24 1207 09/02/24 0643  WBC 9.5 12.1* 10.2 10.4  --  10.2  NEUTROABS 6.7 8.4* 7.1 6.5  --  6.7  HGB 15.6 15.4 14.3 13.7 13.9  14.3 13.6  HCT 44.1 43.8 39.8 38.2* 41.0  42.0 37.9*  MCV 92.8 93.6 91.7 91.0  --  90.7  PLT 113* 120* 111* 134*  --  151   Basic Metabolic Panel: Recent Labs  Lab 08/28/24 0204 08/29/24 0920 08/30/24 0648 08/30/24 0753 08/31/24 0454 09/01/24 0531 09/01/24 1207 09/02/24 0643  NA 140 134* 138  --  133* 131* 134*  133* 131*  K 4.6 3.5 3.3*  --  3.1* 3.3* 3.3*  3.4* 3.4*  CL 97* 93* 89*  --  92* 88*  --  91*  CO2 26 32 34*  --  33* 29  --  28  GLUCOSE 124* 145* 107*  --  127* 204*  --  164*  BUN 18 35*  37*  --  50* 50*  --  51*  CREATININE 1.54* 1.94* 1.72*  --  1.88* 2.03*  --  2.05*  CALCIUM 9.4 8.8* 8.7*  --  8.4* 8.2*  --  8.3*  MG 2.5*  --   --  1.9 2.4 2.1  --  2.1  PHOS 5.3*  --   --   --   --   --   --   --    GFR: Estimated Creatinine Clearance: 29.9 mL/min (A) (by C-G formula based on SCr of 2.05 mg/dL (H)). Liver Function Tests: Recent Labs  Lab 08/29/24 0920 08/30/24 0648 08/31/24 0454 09/01/24 0531 09/02/24 0643  AST 24 23 23 18 18   ALT 20 19 18 16 17   ALKPHOS 59 56 51 54 54  BILITOT 1.8* 2.0* 1.5* 1.0 1.5*  PROT 7.3 7.0 6.7 6.5 6.9  ALBUMIN 4.0 4.0 3.7 3.7 4.0   No results for input(s): LIPASE, AMYLASE in the last 168 hours. No results for input(s): AMMONIA in the last 168 hours. Coagulation Profile: Recent Labs  Lab 08/28/24 0204  INR 1.1   Cardiac Enzymes: Recent Labs  Lab 08/27/24 2349  CKTOTAL 47*   BNP (last 3 results) No results for input(s): PROBNP in the last 8760 hours. HbA1C: Recent Labs    09/01/24 0500  HGBA1C 5.7*   CBG: No  results for input(s): GLUCAP in the last 168 hours. Lipid Profile: Recent Labs    09/01/24 0500  CHOL 160  HDL 44  LDLCALC 102*  TRIG 70  CHOLHDL 3.6   Thyroid  Function Tests: Recent Labs    09/01/24 0500 09/01/24 0531  FREET4  --  1.66*  T3FREE 2.3  --    Anemia Panel: No results for input(s): VITAMINB12, FOLATE, FERRITIN, TIBC, IRON, RETICCTPCT in the last 72 hours. Sepsis Labs: Recent Labs  Lab 08/27/24 2105 08/27/24 2349 08/28/24 0204 08/29/24 1515 08/29/24 1826  PROCALCITON  --  0.11  --   --   --   LATICACIDVEN 2.0*  --  1.3 1.2 1.3    Recent Results (from the past 240 hours)  Resp panel by RT-PCR (RSV, Flu A&B, Covid) Anterior Nasal Swab     Status: None   Collection Time: 08/27/24  7:07 PM   Specimen: Anterior Nasal Swab  Result Value Ref Range Status   SARS Coronavirus 2 by RT PCR NEGATIVE NEGATIVE Final   Influenza A by PCR NEGATIVE NEGATIVE Final   Influenza B by PCR NEGATIVE NEGATIVE Final    Comment: (NOTE) The Xpert Xpress SARS-CoV-2/FLU/RSV plus assay is intended as an aid in the diagnosis of influenza from Nasopharyngeal swab specimens and should not be used as a sole basis for treatment. Nasal washings and aspirates are unacceptable for Xpert Xpress SARS-CoV-2/FLU/RSV testing.  Fact Sheet for Patients: bloggercourse.com  Fact Sheet for Healthcare Providers: seriousbroker.it  This test is not yet approved or cleared by the [place]  FDA and has been authorized for detection and/or diagnosis of SARS-CoV-2 by FDA under an Emergency Use Authorization (EUA). This EUA will remain in effect (meaning this test can be used) for the duration of the COVID-19 declaration under Section 564(b)(1) of the Act, 21 U.S.C. section 360bbb-3(b)(1), unless the authorization is terminated or revoked.     Resp Syncytial Virus by PCR NEGATIVE NEGATIVE Final    Comment: (NOTE) Fact Sheet for  Patients: bloggercourse.com  Fact Sheet for Healthcare Providers: seriousbroker.it  This test is not yet approved or cleared by the [place]  [place] FDA and has been authorized for detection and/or diagnosis of SARS-CoV-2 by FDA under an Emergency Use Authorization (EUA). This EUA will remain in effect (meaning this test can be used) for the duration of the COVID-19 declaration under Section 564(b)(1) of the Act, 21 U.S.C. section 360bbb-3(b)(1), unless the authorization is terminated or revoked.  Performed at [facility], [address] [place], [place] [address]   Culture, blood (routine x 2)     Status: None   Collection Time: 08/27/24  7:15 PM   Specimen: BLOOD RIGHT HAND  Result Value Ref Range Status   Specimen Description BLOOD RIGHT HAND  Final   Special Requests   Final    BOTTLES DRAWN AEROBIC AND ANAEROBIC Blood Culture results may not be optimal due to an inadequate volume of blood received in culture bottles   Culture   Final    NO GROWTH 5 DAYS Performed at [facility], [address], [place], [place] [address]    Report Status 09/01/2024 FINAL  Final  Culture, blood (routine x 2)     Status: None   Collection Time: 08/27/24  7:20 PM   Specimen: BLOOD RIGHT FOREARM  Result Value Ref Range Status   Specimen Description BLOOD RIGHT FOREARM  Final   Special Requests   Final    BOTTLES DRAWN AEROBIC AND ANAEROBIC Blood Culture adequate volume   Culture   Final    NO GROWTH 5 DAYS Performed at [facility], [address] [place], [place] [address]    Report Status 09/01/2024 FINAL  Final     Radiology Studies: DG Orthopantogram Result Date: 09/01/2024 EXAM: PAMALEE SPRINKLE, 1 VIEW 09/01/2024 11:11:51 PM TECHNIQUE: Panorex view of the mandible. COMPARISON: None available. CLINICAL HISTORY: [id number] Preoperative evaluation to rule out surgical contraindication [id number] Preoperative evaluation  to rule out surgical contraindication FINDINGS: DENTAL: Numerous dental cavities throughout the mandibular teeth. Large dental cavity in the lateralmost right lower molar. No periapical abscess visualized. BONES: No acute bony abnormality. TMJ: No dislocation. SOFT TISSUES: The soft tissues are unremarkable. IMPRESSION: 1. Numerous dental cavities. 2. No periapical abscess Electronically signed by: Franky Crease MD 09/01/2024 11:34 PM EST RP Workstation: [id number]   CT CHEST ABDOMEN PELVIS WO CONTRAST Result Date: 09/01/2024 EXAM: CT CHEST, ABDOMEN AND PELVIS WITHOUT CONTRAST 09/01/2024 04:34:13 PM TECHNIQUE: CT of the chest, abdomen and pelvis was performed without the administration of intravenous contrast. Multiplanar reformatted images are provided for review. Automated exposure control, iterative reconstruction, and/or weight based adjustment of the mA/kV was utilized to reduce the radiation dose to as low as reasonably achievable. COMPARISON: 11/28/2014. CLINICAL HISTORY: preoperative evaluation to rule out surgical contraindication; LVAD eval FINDINGS: CHEST: MEDIASTINUM AND LYMPH NODES: Cardiomegaly. Diffuse coronary artery and aortic atherosclerosis. No evidence of aortic aneurysm. The central airways are clear. No mediastinal, hilar or axillary lymphadenopathy. LUNGS AND PLEURA: Biapical scarring. Linear densities at the left lung base, likely atelectasis. Airway thickening and associated opacities in the right middle lobe could reflect acute or chronic infection/inflammation. No pleural effusions. No pneumothorax. ABDOMEN AND PELVIS: LIVER: Mild nodular contours of the liver suggest cirrhosis. GALLBLADDER AND BILE DUCTS: Gallbladder is unremarkable. No biliary ductal dilatation. SPLEEN: No acute abnormality. PANCREAS: No acute abnormality. ADRENAL GLANDS: No acute abnormality. KIDNEYS, URETERS AND BLADDER: No stones in the kidneys or ureters. No hydronephrosis. No perinephric or periureteral stranding.  The bladder wall is mildly thickened, likely related to chronic bladder outlet obstruction and prostatomegaly. GI AND  BOWEL: Stomach demonstrates no acute abnormality. Scattered colonic diverticulosis. No active diverticulitis. There is no bowel obstruction. REPRODUCTIVE ORGANS: Prostate enlargement. Low-density area in the left side of the prostate measures up to 1.8 cm. Cannot exclude prostate mass or abscess. PERITONEUM AND RETROPERITONEUM: No ascites. No free air. VASCULATURE: Heavily calcified abdominal aorta and iliofemoral vessels. ABDOMINAL AND PELVIS LYMPH NODES: No lymphadenopathy. BONES AND SOFT TISSUES: No acute osseous abnormality. No focal soft tissue abnormality. IMPRESSION: 1. Cardiomegaly and diffuse coronary artery and aortic atherosclerosis, without evidence of aortic aneurysm. 2. Airway thickening with associated right middle lobe opacities, which may reflect acute or chronic infection/inflammation. 3. Prostate enlargement with a 1.8 cm low-density focus in the left prostate, indeterminate for mass versus abscess; recommend urologic evaluation with correlation to PSA and prostate MRI or transrectal ultrasound as clinically appropriate. 4. Mildly nodular hepatic contour raising concern for cirrhosis; recommend correlation with liver function tests. Electronically signed by: Franky Crease MD 09/01/2024 07:37 PM EST RP Workstation: [id number]   CT HEAD WO CONTRAST ( ) Result Date: 09/01/2024 EXAM: CT HEAD WITHOUT CONTRAST 09/01/2024 04:34:13 PM TECHNIQUE: CT of the head was performed without the administration of intravenous contrast. Automated exposure control, iterative reconstruction, and/or weight based adjustment of the mA/kV was utilized to reduce the radiation dose to as low as reasonably achievable. COMPARISON: None available. CLINICAL HISTORY: preoperative evaluation to rule out surgical contraindication; LVAD eval FINDINGS: BRAIN AND VENTRICLES: There is no evidence of an acute infarct,  intracranial hemorrhage, mass, midline shift, hydrocephalus, or extra-axial fluid collection. Mild cerebral atrophy is within normal limits for age. Calcified atherosclerosis at the skull base. ORBITS: No acute abnormality. SINUSES: Subtotal opacification of the left sphenoid sinus by circumferential mucosal thickening and likely fluid with associated osteitis, potentially reflecting acute on chronic sinusitis. Small volume fluid in the right sphenoid sinus. Mild mucosal thickening inferiorly in the maxillary sinuses. Trace right mastoid fluid. SOFT TISSUES AND SKULL: No acute soft tissue abnormality. No skull fracture. IMPRESSION: 1. No acute intracranial abnormality. 2. Left sphenoid sinusitis. Electronically signed by: Dasie Hamburg MD 09/01/2024 04:40 PM EST RP Workstation: [id number]   CARDIAC CATHETERIZATION Result Date: 09/01/2024 Findings: On milrinone  0.25 mcg/kg/min RA = 4 RV = 39/5 PA =  37/24 (28) PCW = 20 Fick cardiac output/index = 4.4/2.4 Thermo CO/CI = 4.3/2.3 PVR = 1.8 WU Ao sat = 94% PA sat = 64%, 66% PAPi = 3.25 Assessment: 1. Well compensated hemodynamics on milrinone  Plan/Discussion: Continue VAD w/u. Toribio Fuel, MD 12:30 PM  VAS US  LOWER EXTREMITY VENOUS (DVT) Result Date: 08/31/2024  Lower Venous DVT Study Patient Name:  CAMBELL STANEK Bielicki  Date of Exam:   08/31/2024 Medical Rec #: [id number]     Accession #:    [id number] Date of Birth: 05-03-1946    Patient Gender: M Patient Age:   55 years Exam Location:  [facility] Procedure:      VAS US  LOWER EXTREMITY VENOUS (DVT) Referring Phys: TORIBIO BENSIMHON --------------------------------------------------------------------------------  Indications: SOB, and Preoperative evaluation of a medical condition to rule out surgical contraindications (TAR required). Other Indications: A-fib, biventricular systolic heart failure. Anticoagulation: Eliquis . Comparison Study: No prior exam. Performing Technologist: Edilia Elden Appl  Examination  Guidelines: A complete evaluation includes B-mode imaging, spectral Doppler, color Doppler, and power Doppler as needed of all accessible portions of each vessel. Bilateral testing is considered an integral part of a complete examination. Limited examinations for reoccurring indications may be performed as noted. The reflux portion of the exam is performed  with the patient in reverse Trendelenburg.  +---------+---------------+---------+-----------+----------+--------------+ RIGHT    CompressibilityPhasicitySpontaneityPropertiesThrombus Aging +---------+---------------+---------+-----------+----------+--------------+ CFV      Full           Yes      Yes                                 +---------+---------------+---------+-----------+----------+--------------+ SFJ      Full           Yes      Yes                                 +---------+---------------+---------+-----------+----------+--------------+ FV Prox  Full                                                        +---------+---------------+---------+-----------+----------+--------------+ FV Mid   Full                                                        +---------+---------------+---------+-----------+----------+--------------+ FV DistalFull                                                        +---------+---------------+---------+-----------+----------+--------------+ PFV      Full                                                        +---------+---------------+---------+-----------+----------+--------------+ POP      Full           Yes      Yes                                 +---------+---------------+---------+-----------+----------+--------------+ PTV      Full                                                        +---------+---------------+---------+-----------+----------+--------------+ PERO     Full                                                         +---------+---------------+---------+-----------+----------+--------------+   +---------+---------------+---------+-----------+----------+--------------+ LEFT     CompressibilityPhasicitySpontaneityPropertiesThrombus Aging +---------+---------------+---------+-----------+----------+--------------+ CFV      Full           Yes      Yes                                 +---------+---------------+---------+-----------+----------+--------------+  SFJ      Full           Yes      Yes                                 +---------+---------------+---------+-----------+----------+--------------+ FV Prox  Full                                                        +---------+---------------+---------+-----------+----------+--------------+ FV Mid   Full                                                        +---------+---------------+---------+-----------+----------+--------------+ FV DistalFull                                                        +---------+---------------+---------+-----------+----------+--------------+ PFV      Full                                                        +---------+---------------+---------+-----------+----------+--------------+ POP      Full           Yes      Yes                                 +---------+---------------+---------+-----------+----------+--------------+ PTV      Full                                                        +---------+---------------+---------+-----------+----------+--------------+ PERO     Full                                                        +---------+---------------+---------+-----------+----------+--------------+     Summary: BILATERAL: - No evidence of deep vein thrombosis seen in the lower extremities, bilaterally. -No evidence of popliteal cyst, bilaterally.   *See table(s) above for measurements and observations. Electronically signed by Debby Robertson on 08/31/2024 at 8:04:16 PM.     Final    VAS US  DOPPLER PRE VAD Result Date: 08/31/2024 PERIOPERATIVE VASCULAR EVALUATION Patient Name:  MAVIS FICHERA Rennert  Date of Exam:   08/31/2024 Medical Rec #: [id number]     Accession #:    [id number] Date of Birth: October 25, 1945    Patient Gender: M Patient Age:   56 years Exam Location:  [facility] Procedure:      VAS  US  DOPPLER PRE VAD Referring Phys: DANIEL BENSIMHON --------------------------------------------------------------------------------  Indications:      VAD evaluation, biventricular systolic heart failure, A-fib,                   SOB. Risk Factors:     Hypertension, hyperlipidemia, no history of smoking. Comparison Study: No prior exam. Performing Technologist: Edilia Elden Appl  Examination Guidelines: A complete evaluation includes B-mode imaging, spectral Doppler, color Doppler, and power Doppler as needed of all accessible portions of each vessel. Bilateral testing is considered an integral part of a complete examination. Limited examinations for reoccurring indications may be performed as noted.  Right Carotid Findings: +----------+--------+--------+--------+-------------------------+--------+           PSV cm/sEDV cm/sStenosisDescribe                 Comments +----------+--------+--------+--------+-------------------------+--------+ CCA Prox  100     13                                                +----------+--------+--------+--------+-------------------------+--------+ CCA Distal71      17                                                +----------+--------+--------+--------+-------------------------+--------+ ICA Prox  79      15              heterogenous and calcific         +----------+--------+--------+--------+-------------------------+--------+ ICA Mid   85      22                                                +----------+--------+--------+--------+-------------------------+--------+ ICA Distal60      20                                                 +----------+--------+--------+--------+-------------------------+--------+ ECA       94      12                                                +----------+--------+--------+--------+-------------------------+--------+ +----------+--------+-------+----------------+------------+           PSV cm/sEDV cmsDescribe        Arm Pressure +----------+--------+-------+----------------+------------+ Subclavian129            Multiphasic, WNL             +----------+--------+-------+----------------+------------+ +---------+--------+--+--------+--+---------+ VertebralPSV cm/s42EDV cm/s12Antegrade +---------+--------+--+--------+--+---------+ Left Carotid Findings: +----------+--------+--------+--------+-------------------------+--------+           PSV cm/sEDV cm/sStenosisDescribe                 Comments +----------+--------+--------+--------+-------------------------+--------+ CCA Prox  115     16                                                +----------+--------+--------+--------+-------------------------+--------+  CCA Distal89      19                                                +----------+--------+--------+--------+-------------------------+--------+ ICA Prox  85      21              heterogenous and calcific         +----------+--------+--------+--------+-------------------------+--------+ ICA Mid   64      14                                                +----------+--------+--------+--------+-------------------------+--------+ ICA Distal64      18                                                +----------+--------+--------+--------+-------------------------+--------+ ECA       82      11                                                +----------+--------+--------+--------+-------------------------+--------+ +----------+--------+--------+----------------+------------+ SubclavianPSV cm/sEDV cm/sDescribe        Arm  Pressure +----------+--------+--------+----------------+------------+           150     18      Multiphasic, [id number]          +----------+--------+--------+----------------+------------+ +---------+--------+--+--------+-+---------+ VertebralPSV cm/s29EDV cm/s7Antegrade +---------+--------+--+--------+-+---------+  ABI Findings: +---------+------------------+-----+---------+--------+ Right    Rt Pressure (mmHg)IndexWaveform Comment  +---------+------------------+-----+---------+--------+ Brachial                                 IV lines +---------+------------------+-----+---------+--------+ PTA      167               1.22 triphasic         +---------+------------------+-----+---------+--------+ DP       164               1.20 triphasic         +---------+------------------+-----+---------+--------+ Burnetta Oven                    Normal            +---------+------------------+-----+---------+--------+ +---------+------------------+-----+---------+-------+ Left     Lt Pressure (mmHg)IndexWaveform Comment +---------+------------------+-----+---------+-------+ Brachial 137                    triphasic        +---------+------------------+-----+---------+-------+ PTA      164               1.20 triphasic        +---------+------------------+-----+---------+-------+ DP       166               1.21 biphasic         +---------+------------------+-----+---------+-------+ Great Toe109                    Normal           +---------+------------------+-----+---------+-------+ +-------+---------------+----------------+  ABI/TBIToday's ABI/TBIPrevious ABI/TBI +-------+---------------+----------------+ Right  1.22/ 0.91                      +-------+---------------+----------------+ Left   1.21/ 0.80                      +-------+---------------+----------------+  Summary: Right Carotid: Velocities in the right ICA are consistent with a 1-39%  stenosis. Left Carotid: Velocities in the left ICA are consistent with a 1-39% stenosis. Vertebrals:  Bilateral vertebral arteries demonstrate antegrade flow. Subclavians: Normal flow hemodynamics were seen in bilateral subclavian              arteries.  *See table(s) above for measurements and observations. Right ABI: Resting right ankle-brachial index is within normal range. The right toe-brachial index is normal. Left ABI: Resting left ankle-brachial index is within normal range. The left toe-brachial index is normal.  Electronically signed by Debby Robertson on 08/31/2024 at 8:04:00 PM.    Final     Scheduled Meds:  apixaban   5 mg Oral BID   Chlorhexidine  Gluconate Cloth  6 each Topical Daily   feeding supplement  237 mL Oral TID BM   fluticasone  furoate-vilanterol  1 puff Inhalation Daily   levothyroxine   137 mcg Oral Q0600   potassium chloride   40 mEq Oral Q4H   pravastatin   40 mg Oral Daily   sodium chloride  flush  10-40 mL Intracatheter Q12H   Continuous Infusions:  amiodarone  30 mg/hr (09/02/24 0836)   milrinone  0.25 mcg/kg/min (09/01/24 1748)     LOS: 5 days   Fredia Skeeter, MD [facility]  09/02/2024, 8:43 AM   *Please note that this is a verbal dictation therefore any spelling or grammatical errors are due to the Dragon Medical One system interpretation.  Please page via Amion and do not message via secure chat for urgent patient care matters. Secure chat can be used for non urgent patient care matters.  How to contact the [facility] Attending or Consulting provider 7A - 7P or covering provider during after hours 7P -7A, for this patient?  Check the care team in [facility] and look for a) attending/consulting [facility] provider listed and b) the [facility] team listed. Page or secure chat 7A-7P. Log into [email or web address] and use [facility]'s universal password to access. If you do not have the password, please contact the hospital operator. Locate the [facility] provider you are looking for under [facility] and page to a number that you can be directly reached. If you still have difficulty reaching the provider, please page the [facility] (Director on Call) for the Hospitalists listed on amion for assistance.

## 2024-09-02 NOTE — Progress Notes (Signed)
 Advanced Heart Failure Rounding Note  Cardiologist: [name], MD  AHF: Dr. Zenaida  Chief Complaint: A/c Systolic HF Patient Profile  78 y/o male w/ chronic systolic heart failure w/ biventricular dysfunction d/t mixed ischemic/nonischemic CM, CAD, severe functional MR and permanent atrial fibrillation admitted w/ a/c CHF w/ NYHA Class IV symptoms. Now w/ low output. Initial Co-ox 47%. Started on Milrinone .  Subjective:   Co-ox 57.9% on 0.25 of milrinone .   Borderline coox, but given recently normal hemodynamics on RHC will hold on further increase and diuresis. He is feeling well. Discussion about potential LVAD therapy, he is more open to the procedure now and would like to proceed if deemed a candidate.   Will plan on holding diuretics today and can hopefully start orals tomorrow.   Objective:   Weight Range: 71.2 kg Body mass index is 22.52 kg/m.   Vital Signs:   Temp:  [97.8 F (36.6 C)-98.5 F (36.9 C)] 97.9 F (36.6 C) (12/06 1200) Pulse Rate:  [83-112] 112 (12/06 1200) Resp:  [18-19] 18 (12/06 1200) BP: (113-126)/(62-77) 123/68 (12/06 1200) SpO2:  [95 %-97 %] 96 % (12/06 1200) Weight:  [71.2 kg] 71.2 kg (12/06 0450) Last BM Date : 08/30/24  Weight change: Filed Weights   08/31/24 0300 09/01/24 0441 09/02/24 0450  Weight: 70.4 kg 71.8 kg 71.2 kg    Intake/Output:   Intake/Output Summary (Last 24 hours) at 09/02/2024 1449 Last data filed at 09/01/2024 1900 Gross per 24 hour  Intake 600 ml  Output 450 ml  Net 150 ml    Physical Exam  CVP unhooked General:  elderly appearing.  No respiratory difficulty Neck: JVD ~9 cm.  Cor: irregular rhythm. No murmurs. Lungs: clear Extremities: no edema  Neuro: alert & oriented x 3. Affect pleasant.   Telemetry   Afib 90s, 22 beat NSVT personally reviewed   Labs    CBC Recent Labs    09/01/24 0531 09/01/24 1207 09/02/24 0643  WBC 10.4  --  10.2  NEUTROABS 6.5  --  6.7  HGB 13.7 13.9  14.3 13.6   HCT 38.2* 41.0  42.0 37.9*  MCV 91.0  --  90.7  PLT 134*  --  151   Basic Metabolic Panel Recent Labs    87/94/74 0531 09/01/24 1207 09/02/24 0643  NA 131* 134*  133* 131*  K 3.3* 3.3*  3.4* 3.4*  CL 88*  --  91*  CO2 29  --  28  GLUCOSE 204*  --  164*  BUN 50*  --  51*  CREATININE 2.03*  --  2.05*  CALCIUM 8.2*  --  8.3*  MG 2.1  --  2.1   Liver Function Tests Recent Labs    09/01/24 0531 09/02/24 0643  AST 18 18  ALT 16 17  ALKPHOS 54 54  BILITOT 1.0 1.5*  PROT 6.5 6.9  ALBUMIN 3.7 4.0   No results for input(s): LIPASE, AMYLASE in the last 72 hours. Cardiac Enzymes No results for input(s): CKTOTAL, CKMB, CKMBINDEX, TROPONINI in the last 72 hours.   BNP: BNP (last 3 results) Recent Labs    08/27/24 1907 08/28/24 0204  BNP 1,633.9* 1,952.5*    ProBNP (last 3 results) No results for input(s): PROBNP in the last 8760 hours.   D-Dimer No results for input(s): DDIMER in the last 72 hours. Hemoglobin A1C Recent Labs    09/01/24 0500  HGBA1C 5.7*   Fasting Lipid Panel Recent Labs    09/01/24 0500  CHOL 160  HDL 44  LDLCALC 102*  TRIG 70  CHOLHDL 3.6   Thyroid  Function Tests Recent Labs    09/01/24 0500  T3FREE 2.3     Other results:   Imaging       Medications:     Scheduled Medications:  apixaban   5 mg Oral BID   Chlorhexidine  Gluconate Cloth  6 each Topical Daily   feeding supplement  237 mL Oral TID BM   fluticasone  furoate-vilanterol  1 puff Inhalation Daily   levothyroxine   137 mcg Oral Q0600   pravastatin   40 mg Oral Daily   sodium chloride  flush  10-40 mL Intracatheter Q12H    Infusions:  amiodarone  30 mg/hr (09/02/24 0836)   milrinone  0.25 mcg/kg/min (09/02/24 1249)    PRN Medications: acetaminophen  **OR** acetaminophen , melatonin, ondansetron  (ZOFRAN ) IV, sodium chloride  flush    Assessment/Plan   1. Acute on Chronic Systolic Heart Failure w/ Low Output  - mixed ischemic/idiopathic  CM  - Echo 10/2023: LVEF 25 to 30%, moderate to severely dilated, mildly reduced RV function - NYHA IV on admit - Echo this admit EF <20%, RV not well visualized  - Started on Milrinone  for low co-ox (47%) - Remains on Milrinone  0.25. Co-ox improved, 66%.  - CVP unhooked, breathing stable. Will hold off on further diuretics post cath.  - off Coreg  w/ low output - holding digoxin  given elevated level and AKI. Can restart later if Cr improves - ACC/AHA stage D. Hopeful for improvement for renal function for LVAD. If not would be a home inotrope candidate   2. Chronic Afib  - V-rates 90s-110s  - continue IV amio @ 30/hr while on milrinone   - continue Eliquis     3. Severe MR - functional  - LV cavity more dilated on this admission echo in setting of a/c CHF exacerbation, LVIDs 7.7 cm (6.40 cm) - HF optimization per above   4. VT - in setting of worsening HF, 17 beat run tele 12/2 - keep K > 4.0 and Mg > 2.0 - cont IV amio  - he previously declined ICD but may reconsider   5. CAD - LHC 08/31/2023: LMCA with 20% distal stenosis. LAD with sequential 50% proximal and mid lesions. LCx with 30% mid stenosis, and proximal/mid RCA with 30% disease and 70% stenosis at the ostium of AM1  - denies CP  - continue statin - no ASA given need for Eliquis   6. AKI - b/l SCr 1.4 - bumped to 1.9 w/ diuresis + low output - continue milrinone , hold further diuresis today - Largely stable from yesterday  7. Hypokalemia - give K supp d/w pharmD   8. H/o Malignant Melanoma - occipital scalp, s/p excision @ [facility] in 2023 - never followed up for surveillance head CTs  - may need to revisit prior to potential VAD    Length of Stay: 5  Morene JINNY Brownie, MD  09/02/2024, 2:49 PM  Advanced Heart Failure Team Pager [phone] (M-F; 7a - 5p)  Please contact [facility] for night-coverage after hours (5p -7a ) and weekends on [email or web address]

## 2024-09-02 NOTE — Plan of Care (Signed)

## 2024-09-02 NOTE — Progress Notes (Signed)
 Mobility Specialist Progress Note:    09/02/24 1258  Mobility  Activity Ambulated with assistance  Level of Assistance Standby assist, set-up cues, supervision of patient - no hands on  Assistive Device None  Distance Ambulated (ft) 500 ft  Activity Response Tolerated well  Mobility Referral Yes  Mobility visit 1 Mobility  Mobility Specialist Start Time (ACUTE ONLY) [id number]  Mobility Specialist Stop Time (ACUTE ONLY) 0948  Mobility Specialist Time Calculation (min) (ACUTE ONLY) 10 min   Pt received in bed agreeable to mobility. No physical assistance needed supervision for safety. HR ranged from 110-130 w/ only brief moments in the 130s. No c/o throughout. Returned to room w/o fault. Left in bed w/ call bell and personal belongings in reach. All needs met. RN in room.  Thersia Minder Mobility Specialist  Please contact vis Secure Chat or  Rehab Office [phone]

## 2024-09-03 ENCOUNTER — Encounter (HOSPITAL_COMMUNITY): Payer: Self-pay | Admitting: Internal Medicine

## 2024-09-03 DIAGNOSIS — I5023 Acute on chronic systolic (congestive) heart failure: Secondary | ICD-10-CM | POA: Diagnosis not present

## 2024-09-03 LAB — COOXEMETRY PANEL
Carboxyhemoglobin: 1.4 % (ref 0.5–1.5)
Methemoglobin: 0.9 % (ref 0.0–1.5)
O2 Saturation: 64.7 %
Total hemoglobin: 13.9 g/dL (ref 12.0–16.0)

## 2024-09-03 LAB — COMPREHENSIVE METABOLIC PANEL WITH GFR
ALT: 16 U/L (ref 0–44)
AST: 16 U/L (ref 15–41)
Albumin: 3.7 g/dL (ref 3.5–5.0)
Alkaline Phosphatase: 50 U/L (ref 38–126)
Anion gap: 11 (ref 5–15)
BUN: 44 mg/dL — ABNORMAL HIGH (ref 8–23)
CO2: 27 mmol/L (ref 22–32)
Calcium: 9 mg/dL (ref 8.9–10.3)
Chloride: 95 mmol/L — ABNORMAL LOW (ref 98–111)
Creatinine, Ser: 1.83 mg/dL — ABNORMAL HIGH (ref 0.61–1.24)
GFR, Estimated: 37 mL/min — ABNORMAL LOW (ref >60)
Glucose, Bld: 118 mg/dL — ABNORMAL HIGH (ref 70–99)
Potassium: 3.8 mmol/L (ref 3.5–5.1)
Sodium: 133 mmol/L — ABNORMAL LOW (ref 135–145)
Total Bilirubin: 1.4 mg/dL — ABNORMAL HIGH (ref 0.0–1.2)
Total Protein: 6.6 g/dL (ref 6.5–8.1)

## 2024-09-03 LAB — CBC WITH DIFFERENTIAL/PLATELET
Abs Immature Granulocytes: 0.1 K/uL — ABNORMAL HIGH (ref 0.00–0.07)
Basophils Absolute: 0.1 K/uL (ref 0.0–0.1)
Basophils Relative: 0 %
Eosinophils Absolute: 0.3 K/uL (ref 0.0–0.5)
Eosinophils Relative: 2 %
HCT: 36.8 % — ABNORMAL LOW (ref 39.0–52.0)
Hemoglobin: 13 g/dL (ref 13.0–17.0)
Immature Granulocytes: 1 %
Lymphocytes Relative: 10 %
Lymphs Abs: 1.4 K/uL (ref 0.7–4.0)
MCH: 32.2 pg (ref 26.0–34.0)
MCHC: 35.3 g/dL (ref 30.0–36.0)
MCV: 91.1 fL (ref 80.0–100.0)
Monocytes Absolute: 1.4 K/uL — ABNORMAL HIGH (ref 0.1–1.0)
Monocytes Relative: 10 %
Neutro Abs: 10.3 K/uL — ABNORMAL HIGH (ref 1.7–7.7)
Neutrophils Relative %: 77 %
Platelets: 148 K/uL — ABNORMAL LOW (ref 150–400)
RBC: 4.04 MIL/uL — ABNORMAL LOW (ref 4.22–5.81)
RDW: 12.7 % (ref 11.5–15.5)
WBC: 13.5 K/uL — ABNORMAL HIGH (ref 4.0–10.5)
nRBC: 0 % (ref 0.0–0.2)

## 2024-09-03 LAB — MAGNESIUM: Magnesium: 2.1 mg/dL (ref 1.7–2.4)

## 2024-09-03 MED ORDER — FUROSEMIDE 40 MG PO TABS
40.0000 mg | ORAL_TABLET | Freq: Every day | ORAL | Status: DC
  Administered 2024-09-03: 40 mg via ORAL
  Filled 2024-09-03: qty 1

## 2024-09-03 MED ORDER — POTASSIUM CHLORIDE CRYS ER 20 MEQ PO TBCR
40.0000 meq | EXTENDED_RELEASE_TABLET | Freq: Once | ORAL | Status: AC
  Administered 2024-09-03: 40 meq via ORAL
  Filled 2024-09-03: qty 2

## 2024-09-03 NOTE — Plan of Care (Signed)

## 2024-09-03 NOTE — Progress Notes (Signed)
 Advanced Heart Failure Rounding Note  Cardiologist: [name], MD  AHF: Dr. Zenaida  Chief Complaint: A/c Systolic HF Patient Profile  78 y/o male w/ chronic systolic heart failure w/ biventricular dysfunction d/t mixed ischemic/nonischemic CM, CAD, severe functional MR and permanent atrial fibrillation admitted w/ a/c CHF w/ NYHA Class IV symptoms. Now w/ low output. Initial Co-ox 47%. Started on Milrinone .  Subjective:   Co-ox 64.7% on 0.25 of milrinone .   Overall does feel little bit weaker this morning.  We discussed his improved creatinine and BUN, will restart gentle oral diuretics today.  Long discussion with him and his wife about LVAD therapy, he is continuing to lean more towards having surgery.  We discussed the timing will depend on approval by [facility].   Objective:   Weight Range: 69.9 kg Body mass index is 22.11 kg/m.   Vital Signs:   Temp:  [97.4 F (36.3 C)-98.5 F (36.9 C)] 97.4 F (36.3 C) (12/07 1122) Pulse Rate:  [84-166] 108 (12/07 1122) Resp:  [17-20] 17 (12/07 1122) BP: (109-154)/(70-96) 119/70 (12/07 1122) SpO2:  [94 %-99 %] 98 % (12/07 1122) Weight:  [69.9 kg] 69.9 kg (12/07 0500) Last BM Date : 09/02/24  Weight change: Filed Weights   09/01/24 0441 09/02/24 0450 09/03/24 0500  Weight: 71.8 kg 71.2 kg 69.9 kg    Intake/Output:   Intake/Output Summary (Last 24 hours) at 09/03/2024 1243 Last data filed at 09/03/2024 1123 Gross per 24 hour  Intake 480 ml  Output 900 ml  Net -420 ml    Physical Exam  CVP unhooked GENERAL: NAD, well appearing PULM:  Normal work of breathing, CTAB CARDIAC:  JVP: flat         irregular rate with irregular rhythm. No murmurs, rubs or gallops.  No edema. Warm and well perfused extremities. ABDOMEN: Soft, non-tender, non-distended. NEUROLOGIC: Patient is oriented x3 with no focal or lateralizing neurologic deficits.    Telemetry   A-fib 90s 200s  Labs    CBC Recent Labs    09/02/24 0643  09/03/24 0539  WBC 10.2 13.5*  NEUTROABS 6.7 10.3*  HGB 13.6 13.0  HCT 37.9* 36.8*  MCV 90.7 91.1  PLT 151 148*   Basic Metabolic Panel Recent Labs    87/93/74 0643 09/03/24 0539  NA 131* 133*  K 3.4* 3.8  CL 91* 95*  CO2 28 27  GLUCOSE 164* 118*  BUN 51* 44*  CREATININE 2.05* 1.83*  CALCIUM 8.3* 9.0  MG 2.1 2.1   Liver Function Tests Recent Labs    09/02/24 0643 09/03/24 0539  AST 18 16  ALT 17 16  ALKPHOS 54 50  BILITOT 1.5* 1.4*  PROT 6.9 6.6  ALBUMIN 4.0 3.7   No results for input(s): LIPASE, AMYLASE in the last 72 hours. Cardiac Enzymes No results for input(s): CKTOTAL, CKMB, CKMBINDEX, TROPONINI in the last 72 hours.   BNP: BNP (last 3 results) Recent Labs    08/27/24 1907 08/28/24 0204  BNP 1,633.9* 1,952.5*    ProBNP (last 3 results) No results for input(s): PROBNP in the last 8760 hours.   D-Dimer No results for input(s): DDIMER in the last 72 hours. Hemoglobin A1C Recent Labs    09/01/24 0500  HGBA1C 5.7*   Fasting Lipid Panel Recent Labs    09/01/24 0500  CHOL 160  HDL 44  LDLCALC 102*  TRIG 70  CHOLHDL 3.6   Thyroid  Function Tests Recent Labs    09/01/24 0500  T3FREE 2.3  Other results:    Medications:     Scheduled Medications:  apixaban   5 mg Oral BID   Chlorhexidine  Gluconate Cloth  6 each Topical Daily   feeding supplement  237 mL Oral TID BM   fluticasone  furoate-vilanterol  1 puff Inhalation Daily   furosemide   40 mg Oral Daily   levothyroxine   137 mcg Oral Q0600   pravastatin   40 mg Oral Daily   sodium chloride  flush  10-40 mL Intracatheter Q12H    Infusions:  amiodarone  30 mg/hr (09/03/24 0741)   milrinone  0.25 mcg/kg/min (09/03/24 0740)    PRN Medications: acetaminophen  **OR** acetaminophen , melatonin, ondansetron  (ZOFRAN ) IV, sodium chloride  flush    Assessment/Plan   1. Acute on Chronic Systolic Heart Failure w/ Low Output  - mixed ischemic/idiopathic CM  - Echo  10/2023: LVEF 25 to 30%, moderate to severely dilated, mildly reduced RV function - NYHA IV on admit - Echo this admit EF <20%, RV not well visualized  - Started on Milrinone  for low co-ox (47%) - Remains on Milrinone  0.25. Co-ox improved, 66%.  - CVP unhooked, breathing stable. Will hold off on further diuretics post cath.  - off Coreg  w/ low output - holding digoxin  given elevated level and AKI. Can restart later if Cr improves - ACC/AHA stage D. Improving renal function. Discuss at [facility]. To meet LVAD patients tomorrow  2. Chronic Afib  - V-rates 90s-110s  - continue IV amio @ 30/hr while on milrinone   - continue Eliquis     3. Severe MR - functional  - LV cavity more dilated on this admission echo in setting of a/c CHF exacerbation, LVIDs 7.7 cm (6.40 cm) - HF optimization per above   4. VT - in setting of worsening HF, 17 beat run tele 12/2 - keep K > 4.0 and Mg > 2.0 - cont IV amio  - he previously declined ICD, minimal role at this time for ICD   5. CAD - LHC 08/31/2023: LMCA with 20% distal stenosis. LAD with sequential 50% proximal and mid lesions. LCx with 30% mid stenosis, and proximal/mid RCA with 30% disease and 70% stenosis at the ostium of AM1  - denies CP  - continue statin - no ASA given need for Eliquis   6. AKI - b/l SCr 1.4 - bumped to 1.9 w/ diuresis + low output - continue milrinone , hold further diuresis today - Largely stable from yesterday  7. Hypokalemia - give K supp d/w pharmD   8. H/o Malignant Melanoma - occipital scalp, s/p excision @ [facility] in 2023 - never followed up for surveillance head CTs  - CT head reassuring   Length of Stay: 6  Morene JINNY Brownie, MD  09/03/2024, 12:43 PM  Advanced Heart Failure Team Pager [phone] (M-F; 7a - 5p)  Please contact [facility] for night-coverage after hours (5p -7a ) and weekends on [email or web address]

## 2024-09-03 NOTE — Plan of Care (Signed)
   Problem: Health Behavior/Discharge Planning: Goal: Ability to manage health-related needs will improve Outcome: Progressing   Problem: Clinical Measurements: Goal: Ability to maintain clinical measurements within normal limits will improve Outcome: Progressing   Problem: Clinical Measurements: Goal: Will remain free from infection Outcome: Progressing

## 2024-09-03 NOTE — Progress Notes (Addendum)
 PROGRESS NOTE    [name] [name]  FMW:[id number] DOB: 1946-02-27 DOA: 08/27/2024 PCP: Cleatus Arlyss RAMAN, MD  78/M with chronic biventricular failure, paroxysmal A-fib, hypothyroidism admitted to [facility] 11/30 with acute on chronic systolic CHF.  In the ED he was noted to be in A-fib RVR, volume overloaded, chest x-ray with pulmonary vascular congestion. - Admitted with low output CHF, initial Co. ox is 47, started on milrinone  - CHF team following, improved filling pressures on right heart cath   Subjective: -Feels fair, breathing is better overall  Assessment and Plan:  Acute on chronic systolic heart failure Severe mitral regurgitation - Last echo 2/25 with EF 25-30%, mildly reduced RV -Repeat echo this admission with EF less than 20%, RV not well visualized -On milrinone  for low output, initial Co. ox 47 -CHF team following, off Coreg , improved filling pressures on right heart cath, diuretics currently on hold -LVAD being considered versus home inotropic therapy   Persistent A-fib with RVR -Heart rate in the 100s, remains on amiodarone  while on milrinone  -Continue Eliquis   VT on 12/2 - Continue amiodarone , declined ICD previously - Given additional K, mag is 2.1  AKI on CKD stage IIIa ruled in:   - Baseline creatinine appears to be around 1.3-1.4, now improving, on milrinone .  Mild leukocytosis -Afebrile and nontoxic, monitor for now   Hypothyroidism Continue Synthroid .  TSH within normal limit   Hyperlipidemia Continue pravastatin .   Hyponatremia: Improving   Hypokalemia: Pleated   Chronic thrombocytopenia: Improved.   DVT prophylaxis: Eliquis    Code Status: Full Code  Family Communication: Wife present at bedside Disposition Plan: Home per cards  Antimicrobials:    Objective: Vitals:   09/03/24 0128 09/03/24 0500 09/03/24 0509 09/03/24 0728  BP: 126/87  109/86 119/81  Pulse:    84  Resp: 18  20 18   Temp: 97.9 F (36.6 C)  98.5 F (36.9 C) 98 F (36.7  C)  TempSrc: Oral  Oral Oral  SpO2:    96%  Weight:  69.9 kg    Height:        Intake/Output Summary (Last 24 hours) at 09/03/2024 1009 Last data filed at 09/03/2024 0730 Gross per 24 hour  Intake 240 ml  Output 600 ml  Net -360 ml   Filed Weights   09/01/24 0441 09/02/24 0450 09/03/24 0500  Weight: 71.8 kg 71.2 kg 69.9 kg    Examination:  General exam: Chronically ill elderly, AAO x 3 HEENT: No JVD Respiratory system: Clear to auscultation Cardiovascular system: S1 & S2 heard, irregular Abd: nondistended, soft and nontender.bowel sounds present Extremities: no edema Skin: No rashes Psychiatry:  Mood & affect appropriate.     Data Reviewed:   CBC: Recent Labs  Lab 08/30/24 0648 08/31/24 0454 09/01/24 0531 09/01/24 1207 09/02/24 0643 09/03/24 0539  WBC 12.1* 10.2 10.4  --  10.2 13.5*  NEUTROABS 8.4* 7.1 6.5  --  6.7 10.3*  HGB 15.4 14.3 13.7 13.9  14.3 13.6 13.0  HCT 43.8 39.8 38.2* 41.0  42.0 37.9* 36.8*  MCV 93.6 91.7 91.0  --  90.7 91.1  PLT 120* 111* 134*  --  151 148*   Basic Metabolic Panel: Recent Labs  Lab 08/28/24 0204 08/29/24 0920 08/30/24 0648 08/30/24 0753 08/31/24 0454 09/01/24 0531 09/01/24 1207 09/02/24 0643 09/03/24 0539  NA 140   < > 138  --  133* 131* 134*  133* 131* 133*  K 4.6   < > 3.3*  --  3.1* 3.3* 3.3*  3.4* 3.4* 3.8  CL 97*   < > 89*  --  92* 88*  --  91* 95*  CO2 26   < > 34*  --  33* 29  --  28 27  GLUCOSE 124*   < > 107*  --  127* 204*  --  164* 118*  BUN 18   < > 37*  --  50* 50*  --  51* 44*  CREATININE 1.54*   < > 1.72*  --  1.88* 2.03*  --  2.05* 1.83*  CALCIUM 9.4   < > 8.7*  --  8.4* 8.2*  --  8.3* 9.0  MG 2.5*  --   --  1.9 2.4 2.1  --  2.1 2.1  PHOS 5.3*  --   --   --   --   --   --   --   --    < > = values in this interval not displayed.   GFR: Estimated Creatinine Clearance: 32.9 mL/min (A) (by C-G formula based on SCr of 1.83 mg/dL (H)). Liver Function Tests: Recent Labs  Lab 08/30/24 0648  08/31/24 0454 09/01/24 0531 09/02/24 0643 09/03/24 0539  AST 23 23 18 18 16   ALT 19 18 16 17 16   ALKPHOS 56 51 54 54 50  BILITOT 2.0* 1.5* 1.0 1.5* 1.4*  PROT 7.0 6.7 6.5 6.9 6.6  ALBUMIN 4.0 3.7 3.7 4.0 3.7   No results for input(s): LIPASE, AMYLASE in the last 168 hours. No results for input(s): AMMONIA in the last 168 hours. Coagulation Profile: Recent Labs  Lab 08/28/24 0204  INR 1.1   Cardiac Enzymes: Recent Labs  Lab 08/27/24 2349  CKTOTAL 47*   BNP (last 3 results) No results for input(s): PROBNP in the last 8760 hours. HbA1C: Recent Labs    09/01/24 0500  HGBA1C 5.7*   CBG: No results for input(s): GLUCAP in the last 168 hours. Lipid Profile: Recent Labs    09/01/24 0500  CHOL 160  HDL 44  LDLCALC 102*  TRIG 70  CHOLHDL 3.6   Thyroid  Function Tests: Recent Labs    09/01/24 0500 09/01/24 0531  FREET4  --  1.66*  T3FREE 2.3  --    Anemia Panel: No results for input(s): VITAMINB12, FOLATE, FERRITIN, TIBC, IRON, RETICCTPCT in the last 72 hours. Urine analysis:    Component Value Date/Time   COLORURINE YELLOW 08/27/2024 2048   APPEARANCEUR CLEAR 08/27/2024 2048   LABSPEC 1.010 08/27/2024 2048   PHURINE 5.5 08/27/2024 2048   GLUCOSEU >=500 (A) 08/27/2024 2048   HGBUR TRACE (A) 08/27/2024 2048   BILIRUBINUR NEGATIVE 08/27/2024 2048   KETONESUR 15 (A) 08/27/2024 2048   PROTEINUR NEGATIVE 08/27/2024 2048   NITRITE NEGATIVE 08/27/2024 2048   LEUKOCYTESUR NEGATIVE 08/27/2024 2048   Sepsis Labs: @LABRCNTIP (procalcitonin:4,lacticidven:4)  ) Recent Results (from the past 240 hours)  Resp panel by RT-PCR (RSV, Flu A&B, Covid) Anterior Nasal Swab     Status: None   Collection Time: 08/27/24  7:07 PM   Specimen: Anterior Nasal Swab  Result Value Ref Range Status   SARS Coronavirus 2 by RT PCR NEGATIVE NEGATIVE Final   Influenza A by PCR NEGATIVE NEGATIVE Final   Influenza B by PCR NEGATIVE NEGATIVE Final    Comment:  (NOTE) The Xpert Xpress SARS-CoV-2/FLU/RSV plus assay is intended as an aid in the diagnosis of influenza from Nasopharyngeal swab specimens and should not be used as a sole basis for treatment. Nasal washings and aspirates  are unacceptable for Xpert Xpress SARS-CoV-2/FLU/RSV testing.  Fact Sheet for Patients: bloggercourse.com  Fact Sheet for Healthcare Providers: seriousbroker.it  This test is not yet approved or cleared by the [place]  FDA and has been authorized for detection and/or diagnosis of SARS-CoV-2 by FDA under an Emergency Use Authorization (EUA). This EUA will remain in effect (meaning this test can be used) for the duration of the COVID-19 declaration under Section 564(b)(1) of the Act, 21 U.S.C. section 360bbb-3(b)(1), unless the authorization is terminated or revoked.     Resp Syncytial Virus by PCR NEGATIVE NEGATIVE Final    Comment: (NOTE) Fact Sheet for Patients: bloggercourse.com  Fact Sheet for Healthcare Providers: seriousbroker.it  This test is not yet approved or cleared by the [place]  FDA and has been authorized for detection and/or diagnosis of SARS-CoV-2 by FDA under an Emergency Use Authorization (EUA). This EUA will remain in effect (meaning this test can be used) for the duration of the COVID-19 declaration under Section 564(b)(1) of the Act, 21 U.S.C. section 360bbb-3(b)(1), unless the authorization is terminated or revoked.  Performed at [facility], [address], [place], [place] [address]   Culture, blood (routine x 2)     Status: None   Collection Time: 08/27/24  7:15 PM   Specimen: BLOOD RIGHT HAND  Result Value Ref Range Status   Specimen Description BLOOD RIGHT HAND  Final   Special Requests   Final    BOTTLES DRAWN AEROBIC AND ANAEROBIC Blood Culture results may not be optimal due to an inadequate volume of blood  received in culture bottles   Culture   Final    NO GROWTH 5 DAYS Performed at [facility], [address] [place], [place] [address]    Report Status 09/01/2024 FINAL  Final  Culture, blood (routine x 2)     Status: None   Collection Time: 08/27/24  7:20 PM   Specimen: BLOOD RIGHT FOREARM  Result Value Ref Range Status   Specimen Description BLOOD RIGHT FOREARM  Final   Special Requests   Final    BOTTLES DRAWN AEROBIC AND ANAEROBIC Blood Culture adequate volume   Culture   Final    NO GROWTH 5 DAYS Performed at [facility], [address] [place], [place] [address]    Report Status 09/01/2024 FINAL  Final     Radiology Studies: DG Orthopantogram Result Date: 09/01/2024 EXAM: PAMALEE SPRINKLE, 1 VIEW 09/01/2024 11:11:51 PM TECHNIQUE: Panorex view of the mandible. COMPARISON: None available. CLINICAL HISTORY: [id number] Preoperative evaluation to rule out surgical contraindication [id number] Preoperative evaluation to rule out surgical contraindication FINDINGS: DENTAL: Numerous dental cavities throughout the mandibular teeth. Large dental cavity in the lateralmost right lower molar. No periapical abscess visualized. BONES: No acute bony abnormality. TMJ: No dislocation. SOFT TISSUES: The soft tissues are unremarkable. IMPRESSION: 1. Numerous dental cavities. 2. No periapical abscess Electronically signed by: Franky Crease MD 09/01/2024 11:34 PM EST RP Workstation: [id number]   CT CHEST ABDOMEN PELVIS WO CONTRAST Result Date: 09/01/2024 EXAM: CT CHEST, ABDOMEN AND PELVIS WITHOUT CONTRAST 09/01/2024 04:34:13 PM TECHNIQUE: CT of the chest, abdomen and pelvis was performed without the administration of intravenous contrast. Multiplanar reformatted images are provided for review. Automated exposure control, iterative reconstruction, and/or weight based adjustment of the mA/kV was utilized to reduce the radiation dose to as low as reasonably achievable. COMPARISON: 11/28/2014. CLINICAL  HISTORY: preoperative evaluation to rule out surgical contraindication; LVAD eval FINDINGS: CHEST: MEDIASTINUM AND LYMPH NODES:  Cardiomegaly. Diffuse coronary artery and aortic atherosclerosis. No evidence of aortic aneurysm. The central airways are clear. No mediastinal, hilar or axillary lymphadenopathy. LUNGS AND PLEURA: Biapical scarring. Linear densities at the left lung base, likely atelectasis. Airway thickening and associated opacities in the right middle lobe could reflect acute or chronic infection/inflammation. No pleural effusions. No pneumothorax. ABDOMEN AND PELVIS: LIVER: Mild nodular contours of the liver suggest cirrhosis. GALLBLADDER AND BILE DUCTS: Gallbladder is unremarkable. No biliary ductal dilatation. SPLEEN: No acute abnormality. PANCREAS: No acute abnormality. ADRENAL GLANDS: No acute abnormality. KIDNEYS, URETERS AND BLADDER: No stones in the kidneys or ureters. No hydronephrosis. No perinephric or periureteral stranding. The bladder wall is mildly thickened, likely related to chronic bladder outlet obstruction and prostatomegaly. GI AND BOWEL: Stomach demonstrates no acute abnormality. Scattered colonic diverticulosis. No active diverticulitis. There is no bowel obstruction. REPRODUCTIVE ORGANS: Prostate enlargement. Low-density area in the left side of the prostate measures up to 1.8 cm. Cannot exclude prostate mass or abscess. PERITONEUM AND RETROPERITONEUM: No ascites. No free air. VASCULATURE: Heavily calcified abdominal aorta and iliofemoral vessels. ABDOMINAL AND PELVIS LYMPH NODES: No lymphadenopathy. BONES AND SOFT TISSUES: No acute osseous abnormality. No focal soft tissue abnormality. IMPRESSION: 1. Cardiomegaly and diffuse coronary artery and aortic atherosclerosis, without evidence of aortic aneurysm. 2. Airway thickening with associated right middle lobe opacities, which may reflect acute or chronic infection/inflammation. 3. Prostate enlargement with a 1.8 cm low-density  focus in the left prostate, indeterminate for mass versus abscess; recommend urologic evaluation with correlation to PSA and prostate MRI or transrectal ultrasound as clinically appropriate. 4. Mildly nodular hepatic contour raising concern for cirrhosis; recommend correlation with liver function tests. Electronically signed by: Franky Crease MD 09/01/2024 07:37 PM EST RP Workstation: [id number]   CT HEAD WO CONTRAST ( ) Result Date: 09/01/2024 EXAM: CT HEAD WITHOUT CONTRAST 09/01/2024 04:34:13 PM TECHNIQUE: CT of the head was performed without the administration of intravenous contrast. Automated exposure control, iterative reconstruction, and/or weight based adjustment of the mA/kV was utilized to reduce the radiation dose to as low as reasonably achievable. COMPARISON: None available. CLINICAL HISTORY: preoperative evaluation to rule out surgical contraindication; LVAD eval FINDINGS: BRAIN AND VENTRICLES: There is no evidence of an acute infarct, intracranial hemorrhage, mass, midline shift, hydrocephalus, or extra-axial fluid collection. Mild cerebral atrophy is within normal limits for age. Calcified atherosclerosis at the skull base. ORBITS: No acute abnormality. SINUSES: Subtotal opacification of the left sphenoid sinus by circumferential mucosal thickening and likely fluid with associated osteitis, potentially reflecting acute on chronic sinusitis. Small volume fluid in the right sphenoid sinus. Mild mucosal thickening inferiorly in the maxillary sinuses. Trace right mastoid fluid. SOFT TISSUES AND SKULL: No acute soft tissue abnormality. No skull fracture. IMPRESSION: 1. No acute intracranial abnormality. 2. Left sphenoid sinusitis. Electronically signed by: Dasie Hamburg MD 09/01/2024 04:40 PM EST RP Workstation: [id number]   CARDIAC CATHETERIZATION Result Date: 09/01/2024 Findings: On milrinone  0.25 mcg/kg/min RA = 4 RV = 39/5 PA =  37/24 (28) PCW = 20 Fick cardiac output/index = 4.4/2.4 Thermo CO/CI  = 4.3/2.3 PVR = 1.8 WU Ao sat = 94% PA sat = 64%, 66% PAPi = 3.25 Assessment: 1. Well compensated hemodynamics on milrinone  Plan/Discussion: Continue VAD w/u. Daniel Bensimhon, MD 12:30 PM    Scheduled Meds:  apixaban   5 mg Oral BID   Chlorhexidine  Gluconate Cloth  6 each Topical Daily   feeding supplement  237 mL Oral TID BM   fluticasone  furoate-vilanterol  1 puff Inhalation Daily  levothyroxine   137 mcg Oral Q0600   pravastatin   40 mg Oral Daily   sodium chloride  flush  10-40 mL Intracatheter Q12H   Continuous Infusions:  amiodarone  30 mg/hr (09/03/24 0741)   milrinone  0.25 mcg/kg/min (09/03/24 0740)     LOS: 6 days    Time spent:    Sigurd Pac, MD [facility]   09/03/2024, 10:09 AM

## 2024-09-04 ENCOUNTER — Inpatient Hospital Stay (HOSPITAL_COMMUNITY)

## 2024-09-04 ENCOUNTER — Inpatient Hospital Stay: Admitting: Family Medicine

## 2024-09-04 DIAGNOSIS — I517 Cardiomegaly: Secondary | ICD-10-CM | POA: Diagnosis not present

## 2024-09-04 DIAGNOSIS — I700 Atherosclerosis of aorta: Secondary | ICD-10-CM | POA: Diagnosis not present

## 2024-09-04 DIAGNOSIS — D72829 Elevated white blood cell count, unspecified: Secondary | ICD-10-CM | POA: Diagnosis not present

## 2024-09-04 DIAGNOSIS — I5023 Acute on chronic systolic (congestive) heart failure: Secondary | ICD-10-CM | POA: Diagnosis not present

## 2024-09-04 LAB — RESPIRATORY PANEL BY PCR

## 2024-09-04 LAB — URINALYSIS, ROUTINE W REFLEX MICROSCOPIC
Bacteria, UA: NONE SEEN
Bilirubin Urine: NEGATIVE
Glucose, UA: 50 mg/dL — AB
Hgb urine dipstick: NEGATIVE
Ketones, ur: NEGATIVE mg/dL
Leukocytes,Ua: NEGATIVE
Nitrite: NEGATIVE
Protein, ur: 30 mg/dL — AB
Specific Gravity, Urine: 1.02 (ref 1.005–1.030)
pH: 5 (ref 5.0–8.0)

## 2024-09-04 LAB — CBC WITH DIFFERENTIAL/PLATELET
Abs Immature Granulocytes: 0.14 K/uL — ABNORMAL HIGH (ref 0.00–0.07)
Basophils Absolute: 0.1 K/uL (ref 0.0–0.1)
Basophils Relative: 0 %
Eosinophils Absolute: 0.2 K/uL (ref 0.0–0.5)
Eosinophils Relative: 2 %
HCT: 38 % — ABNORMAL LOW (ref 39.0–52.0)
Hemoglobin: 13.7 g/dL (ref 13.0–17.0)
Immature Granulocytes: 1 %
Lymphocytes Relative: 14 %
Lymphs Abs: 2 K/uL (ref 0.7–4.0)
MCH: 32.8 pg (ref 26.0–34.0)
MCHC: 36.1 g/dL — ABNORMAL HIGH (ref 30.0–36.0)
MCV: 90.9 fL (ref 80.0–100.0)
Monocytes Absolute: 1.4 K/uL — ABNORMAL HIGH (ref 0.1–1.0)
Monocytes Relative: 10 %
Neutro Abs: 10.5 K/uL — ABNORMAL HIGH (ref 1.7–7.7)
Neutrophils Relative %: 73 %
Platelets: 170 K/uL (ref 150–400)
RBC: 4.18 MIL/uL — ABNORMAL LOW (ref 4.22–5.81)
RDW: 12.7 % (ref 11.5–15.5)
WBC: 14.3 K/uL — ABNORMAL HIGH (ref 4.0–10.5)
nRBC: 0 % (ref 0.0–0.2)

## 2024-09-04 LAB — COMPREHENSIVE METABOLIC PANEL WITH GFR
ALT: 16 U/L (ref 0–44)
AST: 17 U/L (ref 15–41)
Albumin: 3.7 g/dL (ref 3.5–5.0)
Alkaline Phosphatase: 58 U/L (ref 38–126)
Anion gap: 13 (ref 5–15)
BUN: 46 mg/dL — ABNORMAL HIGH (ref 8–23)
CO2: 25 mmol/L (ref 22–32)
Calcium: 9.4 mg/dL (ref 8.9–10.3)
Chloride: 95 mmol/L — ABNORMAL LOW (ref 98–111)
Creatinine, Ser: 1.99 mg/dL — ABNORMAL HIGH (ref 0.61–1.24)
GFR, Estimated: 34 mL/min — ABNORMAL LOW (ref >60)
Glucose, Bld: 114 mg/dL — ABNORMAL HIGH (ref 70–99)
Potassium: 4.5 mmol/L (ref 3.5–5.1)
Sodium: 133 mmol/L — ABNORMAL LOW (ref 135–145)
Total Bilirubin: 1.7 mg/dL — ABNORMAL HIGH (ref 0.0–1.2)
Total Protein: 7.1 g/dL (ref 6.5–8.1)

## 2024-09-04 LAB — TYPE AND SCREEN
ABO/RH(D): A POS
Antibody Screen: NEGATIVE

## 2024-09-04 LAB — COOXEMETRY PANEL
Carboxyhemoglobin: 0.7 % (ref 0.5–1.5)
Carboxyhemoglobin: 1.6 % — ABNORMAL HIGH (ref 0.5–1.5)
Carboxyhemoglobin: 1.2 % (ref 0.5–1.5)
Methemoglobin: <0.7 % (ref 0.0–1.5)
Methemoglobin: <0.7 % (ref 0.0–1.5)
Methemoglobin: <0.7 % (ref 0.0–1.5)
O2 Saturation: 44 %
O2 Saturation: 46.3 %
O2 Saturation: 65.7 %
Total hemoglobin: 14.1 g/dL (ref 12.0–16.0)
Total hemoglobin: 13.8 g/dL (ref 12.0–16.0)
Total hemoglobin: 13.3 g/dL (ref 12.0–16.0)

## 2024-09-04 LAB — HEPARIN LEVEL (UNFRACTIONATED): Heparin Unfractionated: >1.1 [IU]/mL — ABNORMAL HIGH (ref 0.30–0.70)

## 2024-09-04 LAB — SURGICAL PCR SCREEN
MRSA, PCR: NEGATIVE
Staphylococcus aureus: NEGATIVE

## 2024-09-04 LAB — MAGNESIUM: Magnesium: 2 mg/dL (ref 1.7–2.4)

## 2024-09-04 LAB — APTT: aPTT: 68 s — ABNORMAL HIGH (ref 24–36)

## 2024-09-04 MED ORDER — CEFAZOLIN SODIUM-DEXTROSE 2-4 GM/100ML-% IV SOLN
2.0000 g | INTRAVENOUS | Status: DC
  Filled 2024-09-04: qty 100

## 2024-09-04 MED ORDER — SODIUM CHLORIDE 0.9 % IV SOLN
1.0000 g | INTRAVENOUS | Status: DC
  Administered 2024-09-04: 1 g via INTRAVENOUS
  Filled 2024-09-04: qty 10

## 2024-09-04 MED ORDER — BISACODYL 5 MG PO TBEC
5.0000 mg | DELAYED_RELEASE_TABLET | Freq: Once | ORAL | Status: AC
  Administered 2024-09-04: 5 mg via ORAL
  Filled 2024-09-04: qty 1

## 2024-09-04 MED ORDER — FUROSEMIDE 10 MG/ML IJ SOLN: 40.0000 mg | Freq: Once | INTRAMUSCULAR | Status: DC

## 2024-09-04 MED ORDER — TRANEXAMIC ACID (OHS) PUMP PRIME SOLUTION
2.0000 mg/kg | INTRAVENOUS | Status: DC
  Filled 2024-09-04 (×2): qty 1.45

## 2024-09-04 MED ORDER — MANNITOL 20 % IV SOLN
INTRAVENOUS | Status: DC
  Filled 2024-09-04: qty 13

## 2024-09-04 MED ORDER — PLASMA-LYTE A IV SOLN
INTRAVENOUS | Status: DC
  Filled 2024-09-04 (×2): qty 2.5

## 2024-09-04 MED ORDER — PHENYLEPHRINE HCL-NACL 20-0.9 MG/250ML-% IV SOLN
30.0000 ug/min | INTRAVENOUS | Status: DC
  Filled 2024-09-04: qty 250

## 2024-09-04 MED ORDER — AMIODARONE IV BOLUS ONLY 150 MG/100ML
150.0000 mg | Freq: Once | INTRAVENOUS | Status: AC
  Administered 2024-09-04: 150 mg via INTRAVENOUS
  Filled 2024-09-04: qty 100

## 2024-09-04 MED ORDER — FUROSEMIDE 10 MG/ML IJ SOLN
80.0000 mg | Freq: Two times a day (BID) | INTRAMUSCULAR | Status: AC
  Administered 2024-09-04 (×2): 80 mg via INTRAVENOUS
  Filled 2024-09-04 (×2): qty 8

## 2024-09-04 MED ORDER — NOREPINEPHRINE 4 MG/250ML-% IV SOLN
0.0000 ug/min | INTRAVENOUS | Status: DC
  Filled 2024-09-04: qty 250

## 2024-09-04 MED ORDER — INSULIN REGULAR(HUMAN) IN NACL 100-0.9 UT/100ML-% IV SOLN
INTRAVENOUS | Status: DC
  Filled 2024-09-04: qty 100

## 2024-09-04 MED ORDER — CHLORHEXIDINE GLUCONATE 0.12 % MT SOLN
15.0000 mL | Freq: Once | OROMUCOSAL | Status: AC
  Administered 2024-09-05: 15 mL via OROMUCOSAL
  Filled 2024-09-04: qty 15

## 2024-09-04 MED ORDER — DEXMEDETOMIDINE HCL IN NACL 400 MCG/100ML IV SOLN
0.1000 ug/kg/h | INTRAVENOUS | Status: DC
  Filled 2024-09-04 (×2): qty 100

## 2024-09-04 MED ORDER — FUROSEMIDE 10 MG/ML IJ SOLN: 40.0000 mg | Freq: Two times a day (BID) | INTRAMUSCULAR | Status: DC

## 2024-09-04 MED ORDER — VANCOMYCIN HCL 1250 MG/250ML IV SOLN
1250.0000 mg | INTRAVENOUS | Status: DC
  Filled 2024-09-04: qty 250

## 2024-09-04 MED ORDER — HEPARIN (PORCINE) 25000 UT/250ML-% IV SOLN
950.0000 [IU]/h | INTRAVENOUS | Status: DC
  Administered 2024-09-04: 950 [IU]/h via INTRAVENOUS
  Filled 2024-09-04: qty 250

## 2024-09-04 MED ORDER — EPINEPHRINE HCL 5 MG/250ML IV SOLN IN NS
0.0000 ug/min | INTRAVENOUS | Status: DC
  Filled 2024-09-04 (×2): qty 250

## 2024-09-04 MED ORDER — TRANEXAMIC ACID 1000 MG/10ML IV SOLN
1.5000 mg/kg/h | INTRAVENOUS | Status: DC
  Filled 2024-09-04 (×2): qty 25

## 2024-09-04 MED ORDER — NITROGLYCERIN IN D5W 200-5 MCG/ML-% IV SOLN
2.0000 ug/min | INTRAVENOUS | Status: DC
  Filled 2024-09-04: qty 250

## 2024-09-04 MED ORDER — POTASSIUM CHLORIDE 2 MEQ/ML IV SOLN
80.0000 meq | INTRAVENOUS | Status: DC
  Filled 2024-09-04: qty 40

## 2024-09-04 MED ORDER — HEPARIN 30,000 UNITS/1000 ML (OHS) CELLSAVER SOLUTION
Status: DC
  Filled 2024-09-04: qty 1000

## 2024-09-04 MED ORDER — CHLORHEXIDINE GLUCONATE CLOTH 2 % EX PADS
6.0000 | MEDICATED_PAD | Freq: Once | CUTANEOUS | Status: AC
  Administered 2024-09-04: 6 via TOPICAL

## 2024-09-04 MED ORDER — TRANEXAMIC ACID (OHS) BOLUS VIA INFUSION
15.0000 mg/kg | INTRAVENOUS | Status: DC
  Filled 2024-09-04: qty 1088

## 2024-09-04 MED ORDER — DOXAZOSIN MESYLATE 2 MG PO TABS
2.0000 mg | ORAL_TABLET | Freq: Every day | ORAL | Status: DC
  Administered 2024-09-04: 2 mg via ORAL
  Filled 2024-09-04 (×2): qty 1

## 2024-09-04 MED ORDER — MILRINONE LACTATE IN DEXTROSE 20-5 MG/100ML-% IV SOLN
0.3000 ug/kg/min | INTRAVENOUS | Status: DC
  Filled 2024-09-04: qty 100

## 2024-09-04 MED ORDER — SODIUM CHLORIDE 0.9 % IV SOLN: 2.0000 g | INTRAVENOUS | Status: DC

## 2024-09-04 MED ORDER — MILRINONE LACTATE IN DEXTROSE 20-5 MG/100ML-% IV SOLN
0.3750 ug/kg/min | INTRAVENOUS | Status: DC
  Administered 2024-09-04 – 2024-09-05 (×3): 0.375 ug/kg/min via INTRAVENOUS
  Filled 2024-09-04 (×2): qty 100

## 2024-09-04 NOTE — Progress Notes (Signed)
 LVAD Initial Psychosocial Screening  Date/Time Initiated:  09/04/24 at 10am Referral Source:  LVAD Coordinators/Heart Failure Team Referral Reason:  LVAD Initial Psychosocial assessment Source of Information:  patient and patient spouse  Demographics Name:  Izael Bessinger Address:  [address], [place] [address] Home phone:  [phone] (home)     Marital Status: Married  Faith:  Christian Primary Language:  English Last 4 # SS: [id number]  DOB: July 04, 1946   Psychological Health Appearance:  Unremarkable and In hospital gown Mental Status:  Alert, oriented Eye Contact:  Good Thought Content:  Coherent Speech:  Unremarkable Mood:  Appropriate  Affect:  Appropriate to circumstance Insight:  Good Judgement: Unimpaired Interaction Style:  Engaged   Family/Social Information Who lives in your home? Name: Geordan Xu  Relationship: spouse   Other family members/support persons in your life? Name: Lavan Imes  Relationship: son   Community Are you active with community agencies/resources/homecare? No  Are you active in a church, synagogue, mosque or other faith based community? No- reports wife goes but he doesn't though still identifies as Christian Are you active in any clubs or social organizations? no What do you do for fun?  Hobbies?  Interests? Likes doing work around the [employer] busy with projects.   Home Environment/Personal Care Do you have reliable phone service? Yes  If so, what is the number?  [phone] Do you own or rent your home? own Number of steps into the home? 4 in front and 1 through garage How many levels in the home? 1 Electrical needs for LVAD (3 prong outlets)? yes Second hand smoke exposure in the home? no Travel distance from [facility]? 15-20 minutes   Financial Information What is your source of income? Both patient and wife receive social security income Do you have difficulty meeting your monthly expenses? No Can you budget for the monthly  cost for dressing supplies post procedure? Yes   Primary Health insurance:  [employer] Have you ever had to refuse medication due to cost?  No Have you applied for Medicaid?  no   Education/Work Information What is the last grade of school you completed? highschool Do you have any problems with reading or writing?  No Preferred method of learning?  Hands on Are you currently employed?  No If not currently employed when did you last work? 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 No    Advance Directives: Do you have a Living Will or Medical POA? Yes  Have you had a consult with the Palliative Care Team at [facility]? Yes   Legal Do you currently have any legal issues/problems?  no Have you had any legal issues/problems in the past?  no  Medical Information Do you have any family history of heart problems? Yes- brother, maternal uncles, and father all had heart problems Do you have a PCP or other medical provider? Dr. Arlyss Solian Are you able to complete your ADL's?  yes Do you have any assistive devices in the home? no How are you currently managing your medications at home? Takes them out of the bottle.  Feels that he has good understanding of his medications and is taking them as prescribed. How many hours do you sleep at night? About 6 hours which is normal for him. How is your appetite? Has been normal Do you smoke now or past usage? past usage  Quit date: briefly smoked when he was [age]yo has not smoked since. Do you drink alcohol now or past usage? Yes   drinks 1-2 glasses of wine most evenings Are you currently using illegal drugs or misuse of medication or past usage? never  Have you ever been treated for substance abuse? No   Mental Health History How have you been feeling in the past year? Been feeling well- no mental health concerns.    States  it has been a lot to process the idea of getting an LVAD but he has not experience feelings of anxiety or depression during this process.  PHQ2 Depression Scale: 0  Have you ever had any problems with depression, anxiety or other mental health concerns? no Have you or are you taking medications for anxiety/depression or any mental health concerns?  No  Current Medications: n/a Have you had any past or current thoughts of suicide? no Are there any other current stressors in your life?  no Do you see a counselor, psychiatrist or therapist?  no If you are currently experiencing problems are you interested in talking with a professional? No What are your coping strategies under stressful situations? Just takes time to himself to process things and come at problems with a clear mind.  Would you be interested in attending the LVAD support group? Maybe.   Medical & Follow-up Do you take your medications as prescribed by the doctors? yes Do you feel as if you have a good understanding of your medications and what they are for? yes If you experience medical concerns or barriers to care in between appointments how do you manage this? no No Show Rate Percentage: 0%   Plan for VAD Implementation Do you know and understand what happens during the VAD surgery? Patient Verbalizes Understanding  of surgery and able to describe details What do you know about the risks and side effect associated with VAD surgery? Patient Verbalizes Understanding  of risks (infection, stroke and death) Explain what will happen right after surgery: Patient Verbalizes Understanding  of OR to ICU and will be intubated  What is your plan for transportation for the first 8 weeks post-surgery? (Patients are not recommended to drive post-surgery for 8 weeks)  Driver: [name] Mania Do you have airbags in your vehicle?  There is a risk of discharging the device if the airbag were to deploy. understands What do you know about your  diet post-surgery? Patient Verbalizes Understanding  of Heart healthy How do you plan to complete ADL's post-surgery?  With assistance from wife and son as needed. Will it be difficult to ask for help from your caregivers?  no  Please explain what you hope will be improved about your life as a result of receiving the LVAD? Hopes to feel close to normal again and have improved qualify of life/ability to do what he used to. Please tell me your biggest concern or fear about living with the LVAD?  Concerned about if there are complications related to surgery like renal failure resulting in dialysis. Please explain your understanding of how your body will change?  Understands he will have driveline/batteries/controller. Are you worried about these changes? no Do you see any barriers to your surgery or follow-up? no  Understanding of LVAD Patient states understanding of the following: Surgical procedures and risks, Electrical need for LVAD (3 prong outlets), Safety precautions with LVAD (water, etc.), LVAD daily self-care (dressing changes, computer check, extra supplies), Outpatient follow up (LVAD clinic appts, monitoring blood  thinners), and Need for Emergency Planning  Discussed and Reviewed with Patient and Caregiver  Patient's current level of motivation to prepare for LVAD: fully motivated to move forward Patient's present Level of Consent for LVAD: consents as long as doctors think this is the best plan for him   Caregiving Needs Who is the primary caregiver? [name] Mania Health status:  good Do you drive?  yes Do you work?  no Physical Limitations:  none Do you have other care giving responsibilities?  no Contact number: [phone]  Who is the secondary caregiver? Alm Mania Health status:  good Do you drive?  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  none Contact number:  [phone]     Education provided to patient/family/caregiver:   Caregiver role and responsibiltiy, Financial planning for LVAD, Role of Clinical Social Worker, and Signs of Depression and Anxiety    Discussed and Reviewed with Patient and Caregiver  Caregiver questions Please explain what you hope will be improved about your life and loved one's life as a result of receiving the LVAD?  Hopes that he will have improved qualify of life and know that getting the LVAD was the right decision for his health. What is your biggest concern or fear about caregiving with an LVAD patient?  Worried about him experiencing complications and regretting his decision to get LVAD. What is your plan for availability to provide care 24/7 x2 weeks post op and dressing changes ongoing?  Has no commitments outside the home and sees no barriers to being with him 24hours a day.  Feels comfortable asking for help from pt son and daughter in law if she does need someone to step in. Preferred method of learning? Written  Do you drive? yes How do you handle stressful situations?  Admits to struggling with depression which sometimes makes it hard to do what she needs to.  Is on medication and feels like she has been stable with depression since stopping work.  When she gets stressed she engages in self care- reading/watching tv.  If she is so bad that she has trouble doing what she needs to she states she has good communication with spouse and other family and can ask for help. Do you think you can do this? yes Is there anything that concerns about caregiving?  no Do you provide caregiving to anyone else?  no  Caregiver's current level of motivation to prepare for LVAD: fully motivated Caregiver's present level of consent for LVAD: ready to proceed  Clinical Interventions Needed:     CSW will monitor signs and symptoms of depression and assist with adjustment to life with an LVAD.   CSW encouraged attendance with the  LVAD Support Group to assist further with adjustment and post implant peer support.   Clinical Impressions/Recommendations:    CSW met with patient and patient wife at bedside to complete LVAD initial psychosocial assessment.  Throughout interview Mr. Lieb was alert and oriented and answered all questions appropriately and clearly.  Mr. Mahaney is 78yo male who lives in a home that he owns with his wife of 1.5 years.  He reports he has one son who also lives locally and is a strong source of support for him.    Mr. And Mrs. Hallums receive disability income and report this is sufficient to pay for housing, food, utilities, etc.  Reports no financial concerns at this  time which would affect his ability to receive stable care.  No significant history of tobacco use or illicit drug use is reported.  He does admit to almost daily alcohol consumption but not in large quantities.  Reports no current or past mental health concerns or need for him to receive support in this area.  He reports his wife, [name], will be his primary caregiver following surgery.  He has known [name] since [employer] but got married to her about 1.5 years ago after the loss of his first wife 3 years ago to pancreatic cancer.  [name] appears very supportive and has good understanding of her role as caregiver.  States no barriers to being with him 24 hours/2weeks following surgery and is able to drive him to and from appointments.  She has no past experience with medical care/dressing changes but reports no concerns with learning how to do dressing changes at this time.  Mr. Casteneda will also be supported by his son who lives about 3 miles from him.  His son is still employed but works for himself so can bend schedule as needed to offer support.  At this time patient and his wife are on board with proceeding with LVAD.  Though patient admits this would not be his first choice he can recognize that his heart is no longer able to do what it needs  to on its own and that LVAD will be the necessary step to him feeling better and living longer.  CSW will continue to follow patient and assist with any social work needs that arise  Canon Gola H. Bradden Tadros, LCSW Clinical Social Worker Advanced Heart Failure Clinic Desk#: [phone] Cell#: [phone]

## 2024-09-04 NOTE — Progress Notes (Signed)
 PHARMACY - ANTICOAGULATION CONSULT NOTE  Pharmacy Consult for heparin  Indication: atrial fibrillation  No Known Allergies  Patient Measurements: Height: 5' 10 (177.8 cm) Weight: 72.5 kg (159 lb 14.4 oz) IBW/kg (Calculated) : 73 HEPARIN  DW (KG): 73.5  Vital Signs: Temp: 98.1 F (36.7 C) (12/08 0717) Temp Source: Oral (12/08 0717) BP: 131/83 (12/08 0717) Pulse Rate: 112 (12/08 0717)  Labs: Recent Labs    09/02/24 0643 09/03/24 0539 09/04/24 0513  HGB 13.6 13.0 13.7  HCT 37.9* 36.8* 38.0*  PLT 151 148* 170  CREATININE 2.05* 1.83* 1.99*    Estimated Creatinine Clearance: 31.4 mL/min (A) (by C-G formula based on SCr of 1.99 mg/dL (H)).   Medical History: Past Medical History:  Diagnosis Date   Atrial fibrillation (HCC)    CHF (congestive heart failure) (HCC)    Cirrhosis (HCC)    ED (erectile dysfunction)    Hyperglycemia    Hyperlipidemia    Hypertension    Internal hemorrhoids    Myocardial infarction (HCC) 2008   SCCA (squamous cell carcinoma) of skin    Left ear.  MOHS surgery 2014 [facility]   Skin cancer of face    2012 basal cell   Thyroid  disease     Medications:  Medications Prior to Admission  Medication Sig Dispense Refill Last Dose/Taking   albuterol  (VENTOLIN  HFA) 108 (90 Base) MCG/ACT inhaler Inhale 2 puffs into the lungs every 4 (four) hours as needed for wheezing or shortness of breath. 1 each 0 Unknown   amoxicillin -clavulanate (AUGMENTIN ) 875-125 MG tablet Take 1 tablet by mouth 2 (two) times daily.   08/27/2024 at  6:00 AM   apixaban  (ELIQUIS ) 5 MG TABS tablet Take 1 tablet by mouth twice daily 180 tablet 1 08/27/2024 at  6:00 AM   carvedilol  (COREG ) 25 MG tablet TAKE 1 TABLET BY MOUTH TWICE DAILY WITH A MEAL 180 tablet 1 08/27/2024 at  6:00 AM   digoxin  (LANOXIN ) 0.125 MG tablet Take 0.125 mg by mouth daily.   08/27/2024 at  7:00 AM   doxazosin  (CARDURA ) 4 MG tablet Take 1 tablet (4 mg total) by mouth at bedtime. 90 tablet 3  08/26/2024 at 10:00 PM   empagliflozin  (JARDIANCE ) 10 MG TABS tablet Take 1 tablet (10 mg total) by mouth daily before breakfast. 90 tablet 3 08/27/2024 at  7:00 AM   ENTRESTO  24-26 MG Take 1 tablet by mouth 2 (two) times daily.   08/27/2024 at  7:00 AM   fish oil-omega-3 fatty acids 1000 MG capsule Take 1 g by mouth daily.   08/27/2024 at  7:00 AM   fluticasone -salmeterol (ADVAIR) 250-50 MCG/ACT AEPB INHALE 1 DOSE BY MOUTH IN THE MORNING AND AT BEDTIME RINSE MOUTH AFTER USE 180 each 3 Unknown   furosemide  (LASIX ) 20 MG tablet Take 1-2 tablets (20-40 mg total) by mouth daily. (Patient taking differently: Take 40 mg by mouth 3 (three) times a week.)   Past Month   furosemide  (LASIX ) 40 MG tablet Take 40 mg by mouth daily.   Unknown   levothyroxine  (SYNTHROID ) 137 MCG tablet Take 1 tablet (137 mcg total) by mouth daily before breakfast. 90 tablet 3 08/27/2024 at  6:00 AM   Melatonin 5 MG CAPS Take 5 mg by mouth daily.    08/26/2024 at 10:00 PM   Multiple Vitamin (MULTIVITAMIN) tablet Take 1 tablet by mouth daily.   08/27/2024 Morning   pravastatin  (PRAVACHOL ) 40 MG tablet Take 1 tablet (40 mg total) by mouth daily. (Patient taking differently: Take  40 mg by mouth at bedtime.) 90 tablet 3 08/26/2024 at 10:00 PM   [EXPIRED] predniSONE  (DELTASONE ) 10 MG tablet Take 2 tablets (20 mg total) by mouth daily for 5 days. 10 tablet 0 08/27/2024 at  6:00 AM   sildenafil  (VIAGRA ) 100 MG tablet Take 1 tablet (100 mg total) by mouth daily as needed for erectile dysfunction. 10 tablet 12 Past Week   spironolactone  (ALDACTONE ) 25 MG tablet Take 1 tablet (25 mg total) by mouth daily. 90 tablet 3 08/27/2024 at  7:00 AM   Scheduled:   Chlorhexidine  Gluconate Cloth  6 each Topical Daily   doxazosin   2 mg Oral Daily   feeding supplement  237 mL Oral TID BM   fluticasone  furoate-vilanterol  1 puff Inhalation Daily   furosemide   80 mg Intravenous BID   levothyroxine   137 mcg Oral Q0600   pravastatin   40 mg Oral Daily    sodium chloride  flush  10-40 mL Intracatheter Q12H    Assessment: 78 yo male here with HF and on apixaban  PTA for afib. Plans for possible procedures and pharmacy consulted to dose IV heparin    -last apixaban  dose 12/7 ~ 9pm -Hg 13.7, SCr 1.99  Goal of Therapy:  Heparin  level 0.3-0.7 units/ml aPTT 66-102 seconds Monitor platelets by anticoagulation protocol: Yes   Plan:  -No heparin  bolus due to recent apixaban  -Start IV heparin  at 950 units/hr -heparin  level and aPTT in 8 hrs -daily heparin  level, aPTT and CBC  Prentice Poisson, PharmD Clinical Pharmacist **Pharmacist phone directory can now be found on [email or web address] ([email or web address]).  Listed under [facility].

## 2024-09-04 NOTE — Progress Notes (Addendum)
 Advanced Heart Failure Rounding Note  Cardiologist: [name], MD  AHF: Dr. Zenaida  Chief Complaint: A/c Systolic HF Patient Profile   78 y/o male w/ chronic systolic heart failure w/ biventricular dysfunction d/t mixed ischemic/nonischemic CM, CAD, severe functional MR and permanent atrial fibrillation admitted w/ a/c CHF w/ NYHA Class IV symptoms. Now w/ low output. Initial Co-ox 47%. Started on Milrinone .   Subjective:    Co-ox 44%, repeat pending. On milrinone  0.25. sCr 1.8>1.99. Weight up 5 lbs. AF rates have been more poorly controlled over the weekend. HR up in 150-160s when OOB. Higher frequency of short NSVT runs on tele. Feeling more fatigued, now with productive cough. Has had some DOE.    Objective:    Weight Range: 72.5 kg Body mass index is 22.94 kg/m.   Vital Signs:   Temp:  [97.4 F (36.3 C)-98.3 F (36.8 C)] 98.1 F (36.7 C) (12/08 0717) Pulse Rate:  [90-112] 112 (12/08 0717) Resp:  [17-18] 18 (12/08 0717) BP: (116-139)/(70-89) 131/83 (12/08 0717) SpO2:  [96 %-98 %] 97 % (12/08 0717) Weight:  [72.5 kg] 72.5 kg (12/08 0500) Last BM Date : 09/02/24  Weight change: Filed Weights   09/02/24 0450 09/03/24 0500 09/04/24 0500  Weight: 71.2 kg 69.9 kg 72.5 kg   Intake/Output:  Intake/Output Summary (Last 24 hours) at 09/04/2024 0733 Last data filed at 09/04/2024 0525 Gross per 24 hour  Intake 872 ml  Output 800 ml  Net 72 ml    Physical Exam   CVP 9-10 General: Elderly appearing. No distress  Cardiac: JVP ~10 cm. No murmurs  Resp: Lung sounds clear and equal B/L Abdomen: soft, bloating Extremities: Warm and dry.  No peripheral edema.  Neuro: A&O x3. Affect pleasant.   Telemetry   AF 100-120s (personally reviewed)  Labs    CBC Recent Labs    09/03/24 0539 09/04/24 0513  WBC 13.5* 14.3*  NEUTROABS 10.3* 10.5*  HGB 13.0 13.7  HCT 36.8* 38.0*  MCV 91.1 90.9  PLT 148* 170   Basic Metabolic Panel Recent Labs    87/92/74 0539  09/04/24 0513  NA 133* 133*  K 3.8 4.5  CL 95* 95*  CO2 27 25  GLUCOSE 118* 114*  BUN 44* 46*  CREATININE 1.83* 1.99*  CALCIUM 9.0 9.4  MG 2.1 2.0   Liver Function Tests Recent Labs    09/03/24 0539 09/04/24 0513  AST 16 17  ALT 16 16  ALKPHOS 50 58  BILITOT 1.4* 1.7*  PROT 6.6 7.1  ALBUMIN 3.7 3.7   BNP (last 3 results) Recent Labs    08/27/24 1907 08/28/24 0204  BNP 1,633.9* 1,952.5*   Medications:    Scheduled Medications:  apixaban   5 mg Oral BID   Chlorhexidine  Gluconate Cloth  6 each Topical Daily   feeding supplement  237 mL Oral TID BM   fluticasone  furoate-vilanterol  1 puff Inhalation Daily   levothyroxine   137 mcg Oral Q0600   pravastatin   40 mg Oral Daily   sodium chloride  flush  10-40 mL Intracatheter Q12H    Infusions:  amiodarone  30 mg/hr (09/03/24 1949)   milrinone  0.25 mcg/kg/min (09/04/24 0323)    PRN Medications: acetaminophen  **OR** acetaminophen , melatonin, ondansetron  (ZOFRAN ) IV, sodium chloride  flush  Assessment/Plan   1. Acute on Chronic Systolic Heart Failure w/ Low Output  - mixed ischemic/idiopathic CM  - Echo 2/25: LVEF 25 to 30%, moderate to severely dilated, mildly reduced RV function - Echo 12/1 EF <20%, RV  not well visualized  - RHC 12/5 (on milrinone  0.25): Well compensated. mildly elevated L sided filling and PA pressures with mildly reduced CO by TD 4.3/2.3 - Initial co-ox 47%. Started on milrinone  0.25; continue  - Co-ox has been stable 60s. 44% this am, sending repeat now.  - CVP 9-10, volume up on exam. Give IV Lasix  40 mg bid today. - off Coreg  w/ low output - off digoxin  given elevated level and AKI. Can restart later if Cr improves. - other GDMT limited by renal function - ACC/AHA stage D. Discuss at [facility]. To meet LVAD patients 12/8.  2. Permanent Afib  - V-rates 90s-120s, poorly controlled with exertion - increase IV amio to 60/hr with VR  - continue Eliquis     3. Severe MR - functional  - LV cavity  more dilated on this admission echo in setting of a/c CHF exacerbation, LVIDs 7.7 cm (6.40 cm) - HF optimization per above   4. VT/NSVT - in setting of worsening HF, 17 beat run tele 12/2 - increased frequency of NSVT runs  - keep K > 4.0 and Mg > 2.0 - increase IV amio  - he previously declined ICD, minimal role at this time for ICD   5. CAD - LHC 08/31/2023: LMCA with 20% distal stenosis. LAD with sequential 50% proximal and mid lesions. LCx with 30% mid stenosis, and proximal/mid RCA with 30% disease and 70% stenosis at the ostium of AM1  - denies CP  - continue statin - no ASA given need for Eliquis   6. AKI - b/l SCr 1.4 - bumped to 1.9 w/ diuresis + low output - has been stable ~2 - continue milrinone   7. Hypokalemia - give K supp d/w pharmD   8. H/o Malignant Melanoma - occipital scalp, s/p excision @ [facility] in 2023 - never followed up for surveillance head CTs  - CT head reassuring  Length of Stay: 7  Jordan Lee, NP  09/04/2024, 7:33 AM  Advanced Heart Failure Team Pager [phone] (M-F; 7a - 5p)  Please contact [facility] for night-coverage after hours (5p -7a ) and weekends on [email or web address]  Patient seen with NP, I formulated the plan and agree with the above note.   He feels worse today, more short of breath and orthopneic. HR elevated, 110s-130s in atrial fibrillation.   Early am co-ox 44%, CVP 12 on my read.   General: NAD Neck: JVP 12-14 cm, no thyromegaly or thyroid  nodule.  Lungs: Clear to auscultation bilaterally with normal respiratory effort. CV: Lateral PMI.  Heart tachy, irregular S1/S2, no S3/S4, no murmur.  1+ ankle edema.   Abdomen: Soft, nontender, no hepatosplenomegaly, no distention.  Skin: Intact without lesions or rashes.  Neurologic: Alert and oriented x 3.  Psych: Normal affect. Extremities: No clubbing or cyanosis.  HEENT: Normal.   Patient is volume overloaded on exam today with early am co-ox 44% on milrinone  0.25.  CVP 12 on my read  this morning. He feels worse.  - Repeat co-ox (still waiting).  If < 60, will increase to 0.375 and will need to consider Impella 5.5 placement for optimization prior to LVAD. - Lasix  80 mg IV bid x 2 doses today.   Bolus amiodarone  150 mg IV x 1 and increase to 60 mg/hr with elevated HR. Will transition apixaban  to heparin  gtt in case procedure needed.   Being evaluated for LVAD, renal function is a concern.  Mildly higher at 1.99 today.  ?Need for Impella 5.5 for pre-op  optimization .  [name] 09/04/2024 9:00 AM

## 2024-09-04 NOTE — H&P (View-Only) (Signed)
 3 Days Post-Op Procedure(s) (LRB): RIGHT HEART CATH (N/A) Subjective: 78 year-old man with decompensated left ventricular failure requiring pressor support. Requested by AHF team to see for Impella placement Patient currently without complaints Objective: Vital signs in last 24 hours: Temp:  [97.7 F (36.5 C)-98.3 F (36.8 C)] 97.8 F (36.6 C) (12/08 1133) Pulse Rate:  [80-112] 80 (12/08 1133) Cardiac Rhythm: Other (Comment) (12/08 1013) Resp:  [17-20] 20 (12/08 1133) BP: (114-139)/(60-89) 114/60 (12/08 1133) SpO2:  [96 %-98 %] 96 % (12/08 1133) Weight:  [72.5 kg] 72.5 kg (12/08 0500)  Hemodynamic parameters for last 24 hours: CVP:  [7 mmHg-16 mmHg] 16 mmHg  Intake/Output from previous day: 12/07 0701 - 12/08 0700 In: 872 [P.O.:610; I.V.:262] Out: 1000 [Urine:1000] Intake/Output this shift: Total I/O In: 120 [P.O.:120] Out: 420 [Urine:420]  General appearance: alert, cooperative, and no distress Neurologic: intact Heart: irregularly irregular rhythm Lungs: diminished breath sounds bibasilar  Lab Results: Recent Labs    09/03/24 0539 09/04/24 0513  WBC 13.5* 14.3*  HGB 13.0 13.7  HCT 36.8* 38.0*  PLT 148* 170   BMET:  Recent Labs    09/03/24 0539 09/04/24 0513  NA 133* 133*  K 3.8 4.5  CL 95* 95*  CO2 27 25  GLUCOSE 118* 114*  BUN 44* 46*  CREATININE 1.83* 1.99*  CALCIUM 9.0 9.4    PT/INR: No results for input(s): LABPROT, INR in the last 72 hours. ABG    Component Value Date/Time   PHART 7.389 08/31/2023 1020   HCO3 28.7 (H) 09/01/2024 1207   HCO3 28.6 (H) 09/01/2024 1207   TCO2 30 09/01/2024 1207   TCO2 30 09/01/2024 1207   ACIDBASEDEF 1.0 08/31/2023 1020   O2SAT 65.7 09/04/2024 1424   CBG (last 3)  No results for input(s): GLUCAP in the last 72 hours.  Assessment/Plan: S/P Procedure(s) (LRB): RIGHT HEART CATH (N/A) - Severe decompensated heart failure.  Being evaluated for Heart Mate 3, but in meantime has developed increasing  pressor requirements.  I have been aske to place an IMpella 5.5 as a bridge to a permanent device.  I discussed the general nature of the procedure, including the need for general anesthesia, the incisions to be used, and the presence of a indwelling device with Mr and Mrs Kissick.  They understand this is a temporary device to maintain perfusion until a permanent device can be placed.  I informed them of the indications, risks, benefits and alternatives.   They understand the risks include, but are not limited to death, stroke, MI, DVT/PE, bleeding, possible need for transfusion, infections, perforation of heart or major blood vessel, cardiac arrhythmias, as well as other organ system dysfunction including respiratory, renal, or GI complications.    He accepts the risks and agrees to proceed.  Elspeth MOTE Kerrin, MD [facility] [phone]   LOS: 7 days    Elspeth JAYSON Kerrin 09/04/2024

## 2024-09-04 NOTE — Progress Notes (Signed)
 PHARMACY - ANTICOAGULATION CONSULT NOTE  Pharmacy Consult for heparin  Indication: atrial fibrillation  No Known Allergies  Patient Measurements: Height: 5' 10 (177.8 cm) Weight: 72.5 kg (159 lb 14.4 oz) IBW/kg (Calculated) : 73 HEPARIN  DW (KG): 73.5  Vital Signs: Temp: 97.9 F (36.6 C) (12/08 1956) Temp Source: Oral (12/08 1956) BP: 117/73 (12/08 1956) Pulse Rate: 87 (12/08 1956)  Labs: Recent Labs    09/02/24 0643 09/03/24 0539 09/04/24 0513 09/04/24 1802  HGB 13.6 13.0 13.7  --   HCT 37.9* 36.8* 38.0*  --   PLT 151 148* 170  --   HEPARINUNFRC  --   --   --  >1.10*  CREATININE 2.05* 1.83* 1.99*  --     Estimated Creatinine Clearance: 31.4 mL/min (A) (by C-G formula based on SCr of 1.99 mg/dL (H)).   Medical History: Past Medical History:  Diagnosis Date   Atrial fibrillation (HCC)    CHF (congestive heart failure) (HCC)    Cirrhosis (HCC)    ED (erectile dysfunction)    Hyperglycemia    Hyperlipidemia    Hypertension    Internal hemorrhoids    Myocardial infarction (HCC) 2008   SCCA (squamous cell carcinoma) of skin    Left ear.  MOHS surgery 2014 [facility]   Skin cancer of face    2012 basal cell   Thyroid  disease     Medications:  Medications Prior to Admission  Medication Sig Dispense Refill Last Dose/Taking   albuterol  (VENTOLIN  HFA) 108 (90 Base) MCG/ACT inhaler Inhale 2 puffs into the lungs every 4 (four) hours as needed for wheezing or shortness of breath. 1 each 0 Unknown   amoxicillin -clavulanate (AUGMENTIN ) 875-125 MG tablet Take 1 tablet by mouth 2 (two) times daily.   08/27/2024 at  6:00 AM   apixaban  (ELIQUIS ) 5 MG TABS tablet Take 1 tablet by mouth twice daily 180 tablet 1 08/27/2024 at  6:00 AM   carvedilol  (COREG ) 25 MG tablet TAKE 1 TABLET BY MOUTH TWICE DAILY WITH A MEAL 180 tablet 1 08/27/2024 at  6:00 AM   digoxin  (LANOXIN ) 0.125 MG tablet Take 0.125 mg by mouth daily.   08/27/2024 at  7:00 AM   doxazosin  (CARDURA ) 4 MG  tablet Take 1 tablet (4 mg total) by mouth at bedtime. 90 tablet 3 08/26/2024 at 10:00 PM   empagliflozin  (JARDIANCE ) 10 MG TABS tablet Take 1 tablet (10 mg total) by mouth daily before breakfast. 90 tablet 3 08/27/2024 at  7:00 AM   ENTRESTO  24-26 MG Take 1 tablet by mouth 2 (two) times daily.   08/27/2024 at  7:00 AM   fish oil-omega-3 fatty acids 1000 MG capsule Take 1 g by mouth daily.   08/27/2024 at  7:00 AM   fluticasone -salmeterol (ADVAIR) 250-50 MCG/ACT AEPB INHALE 1 DOSE BY MOUTH IN THE MORNING AND AT BEDTIME RINSE MOUTH AFTER USE 180 each 3 Unknown   furosemide  (LASIX ) 20 MG tablet Take 1-2 tablets (20-40 mg total) by mouth daily. (Patient taking differently: Take 40 mg by mouth 3 (three) times a week.)   Past Month   furosemide  (LASIX ) 40 MG tablet Take 40 mg by mouth daily.   Unknown   levothyroxine  (SYNTHROID ) 137 MCG tablet Take 1 tablet (137 mcg total) by mouth daily before breakfast. 90 tablet 3 08/27/2024 at  6:00 AM   Melatonin 5 MG CAPS Take 5 mg by mouth daily.    08/26/2024 at 10:00 PM   Multiple Vitamin (MULTIVITAMIN) tablet Take 1 tablet by  mouth daily.   08/27/2024 Morning   pravastatin  (PRAVACHOL ) 40 MG tablet Take 1 tablet (40 mg total) by mouth daily. (Patient taking differently: Take 40 mg by mouth at bedtime.) 90 tablet 3 08/26/2024 at 10:00 PM   [EXPIRED] predniSONE  (DELTASONE ) 10 MG tablet Take 2 tablets (20 mg total) by mouth daily for 5 days. 10 tablet 0 08/27/2024 at  6:00 AM   sildenafil  (VIAGRA ) 100 MG tablet Take 1 tablet (100 mg total) by mouth daily as needed for erectile dysfunction. 10 tablet 12 Past Week   spironolactone  (ALDACTONE ) 25 MG tablet Take 1 tablet (25 mg total) by mouth daily. 90 tablet 3 08/27/2024 at  7:00 AM   Scheduled:   [START ON 09/05/2024] chlorhexidine   15 mL Mouth/Throat Once   Chlorhexidine  Gluconate Cloth  6 each Topical Daily   doxazosin   2 mg Oral Daily   feeding supplement  237 mL Oral TID BM   fluticasone  furoate-vilanterol  1  puff Inhalation Daily   levothyroxine   137 mcg Oral Q0600   pravastatin   40 mg Oral Daily   sodium chloride  flush  10-40 mL Intracatheter Q12H    Assessment: 78 yo male here with HF and on apixaban  PTA for afib. Plans for possible procedures and pharmacy consulted to dose IV heparin    -last apixaban  dose 12/7 ~ 9pm -Hg 13.7, SCr 1.99  PM: Heparin  level >1.1 due to recent apixaban .  aPTT 68 (called and confirmed with lab though not crossing over in system) - therapeutic for current rate at 950 units/hr.   Goal of Therapy:  Heparin  level 0.3-0.7 units/ml aPTT 66-102 seconds Monitor platelets by anticoagulation protocol: Yes   Plan:  Continue IV heparin  at 950 units/hr Daily heparin  level, aPTT and CBC  Harlene Boga, PharmD, BCPS, BCCCP Please refer to [facility] for [facility] numbers 09/04/2024, 9:59 PM

## 2024-09-04 NOTE — Progress Notes (Signed)
 Physical Therapy Treatment Patient Details Name: [name] [name] MRN: [id number] DOB: 1946/02/22 Today's Date: 09/04/2024   History of Present Illness 78 yo male admitted 11/30 with SOB Pt with workup for PNA and acute CHF exacerbation  PMH CHF, a-fib on Eliquis , HTN, Hypothyroidism, MI,SCCA, cirrhosis    PT Comments  Pt seated up EOB on arrival, pleasant and agreeable to session, however limited by elevated HR with mobility and general fatigue. Pt demonstrating transfers sit<>stand and gait without AD support at grossly supervision level for safety with no overt LOB noted. Pt declining hallway ambulation due to fatigue. Pt performing serial sit<>stands for increased LE strength. HR up to 159bpm with activity, RN updated and aware, pt with dyspnea throughout activity. Educated pt on importance and benefits of frequent mobilization to maximize functional mobility gains with pt verbalizing understanding. Pt continues to benefit from skilled PT services to progress toward functional mobility goals.     If plan is discharge home, recommend the following: Assist for transportation;Help with stairs or ramp for entrance   Can travel by private vehicle        Equipment Recommendations  None recommended by PT    Recommendations for Other Services       Precautions / Restrictions Precautions Precautions: Fall Recall of Precautions/Restrictions: Intact Restrictions Weight Bearing Restrictions Per Provider Order: No     Mobility  Bed Mobility Overal bed mobility: Modified Independent             General bed mobility comments: pt receieved standing at EOB and returned to sitting EOB    Transfers Overall transfer level: Independent Equipment used: None               General transfer comment: Pt completed sit to stand from EOB w/out AD and no physical assistance. Pt demonstrates good control throughout transfer, cues for PLB once seated after serial sit<>stands as pt HR elevated  into 150's    Ambulation/Gait Ambulation/Gait assistance: Supervision Gait Distance (Feet): 50 Feet Assistive device: None Gait Pattern/deviations: WFL(Within Functional Limits)       General Gait Details: Pt demonstrates reciprocal gait pattern and no signs of instability or LOB within room, pt declining hallway ambualtion due to fatigue   Stairs             Wheelchair Mobility     Tilt Bed    Modified Rankin (Stroke Patients Only)       Balance Overall balance assessment: Modified Independent                                          Communication Communication Communication: No apparent difficulties  Cognition Arousal: Alert Behavior During Therapy: WFL for tasks assessed/performed   PT - Cognitive impairments: No apparent impairments                         Following commands: Intact      Cueing Cueing Techniques: Verbal cues  Exercises Other Exercises Other Exercises: serial sit<>stand x5 (2 sets) with UE resting on lap    General Comments General comments (skin integrity, edema, etc.): HR 106bpm seated up EOB on arrival at rest, up to 128bpm with ambulation, up to 159bpm with serial sit<>stands      Pertinent Vitals/Pain Pain Assessment Pain Assessment: No/denies pain    Home Living  Prior Function            PT Goals (current goals can now be found in the care plan section) Acute Rehab PT Goals Patient Stated Goal: to go home PT Goal Formulation: With patient/family Time For Goal Achievement: 09/13/24 Progress towards PT goals: Progressing toward goals    Frequency    Min 1X/week      PT Plan      Co-evaluation              AM-PAC PT 6 Clicks Mobility   Outcome Measure  Help needed turning from your back to your side while in a flat bed without using bedrails?: None Help needed moving from lying on your back to sitting on the side of a flat bed without  using bedrails?: None Help needed moving to and from a bed to a chair (including a wheelchair)?: None Help needed standing up from a chair using your arms (e.g., wheelchair or bedside chair)?: None Help needed to walk in hospital room?: A Little Help needed climbing 3-5 steps with a railing? : Total 6 Click Score: 20    End of Session   Activity Tolerance: Patient tolerated treatment well Patient left: in bed;with call bell/phone within reach;with family/visitor present Nurse Communication: Mobility status (Pt's spouse requesting to be able to walk with pt in the hallway; RN notified.) PT Visit Diagnosis: Other abnormalities of gait and mobility (R26.89)     Time: 8866-8846 PT Time Calculation (min) (ACUTE ONLY): 20 min  Charges:    $Therapeutic Exercise: 8-22 mins PT General Charges $$ ACUTE PT VISIT: 1 Visit                     Donte Lenzo R. PTA Acute Rehabilitation Services Office: [phone]   Therisa CHRISTELLA Boor 09/04/2024, 12:49 PM

## 2024-09-04 NOTE — Plan of Care (Signed)

## 2024-09-04 NOTE — Progress Notes (Signed)
 3 Days Post-Op Procedure(s) (LRB): RIGHT HEART CATH (N/A) Subjective: 78 year-old man with decompensated left ventricular failure requiring pressor support. Requested by AHF team to see for Impella placement Patient currently without complaints Objective: Vital signs in last 24 hours: Temp:  [97.7 F (36.5 C)-98.3 F (36.8 C)] 97.8 F (36.6 C) (12/08 1133) Pulse Rate:  [80-112] 80 (12/08 1133) Cardiac Rhythm: Other (Comment) (12/08 1013) Resp:  [17-20] 20 (12/08 1133) BP: (114-139)/(60-89) 114/60 (12/08 1133) SpO2:  [96 %-98 %] 96 % (12/08 1133) Weight:  [72.5 kg] 72.5 kg (12/08 0500)  Hemodynamic parameters for last 24 hours: CVP:  [7 mmHg-16 mmHg] 16 mmHg  Intake/Output from previous day: 12/07 0701 - 12/08 0700 In: 872 [P.O.:610; I.V.:262] Out: 1000 [Urine:1000] Intake/Output this shift: Total I/O In: 120 [P.O.:120] Out: 420 [Urine:420]  General appearance: alert, cooperative, and no distress Neurologic: intact Heart: irregularly irregular rhythm Lungs: diminished breath sounds bibasilar  Lab Results: Recent Labs    09/03/24 0539 09/04/24 0513  WBC 13.5* 14.3*  HGB 13.0 13.7  HCT 36.8* 38.0*  PLT 148* 170   BMET:  Recent Labs    09/03/24 0539 09/04/24 0513  NA 133* 133*  K 3.8 4.5  CL 95* 95*  CO2 27 25  GLUCOSE 118* 114*  BUN 44* 46*  CREATININE 1.83* 1.99*  CALCIUM 9.0 9.4    PT/INR: No results for input(s): LABPROT, INR in the last 72 hours. ABG    Component Value Date/Time   PHART 7.389 08/31/2023 1020   HCO3 28.7 (H) 09/01/2024 1207   HCO3 28.6 (H) 09/01/2024 1207   TCO2 30 09/01/2024 1207   TCO2 30 09/01/2024 1207   ACIDBASEDEF 1.0 08/31/2023 1020   O2SAT 65.7 09/04/2024 1424   CBG (last 3)  No results for input(s): GLUCAP in the last 72 hours.  Assessment/Plan: S/P Procedure(s) (LRB): RIGHT HEART CATH (N/A) - Severe decompensated heart failure.  Being evaluated for Heart Mate 3, but in meantime has developed increasing  pressor requirements.  I have been aske to place an IMpella 5.5 as a bridge to a permanent device.  I discussed the general nature of the procedure, including the need for general anesthesia, the incisions to be used, and the presence of a indwelling device with Mr and Mrs Kissick.  They understand this is a temporary device to maintain perfusion until a permanent device can be placed.  I informed them of the indications, risks, benefits and alternatives.   They understand the risks include, but are not limited to death, stroke, MI, DVT/PE, bleeding, possible need for transfusion, infections, perforation of heart or major blood vessel, cardiac arrhythmias, as well as other organ system dysfunction including respiratory, renal, or GI complications.    He accepts the risks and agrees to proceed.  Elspeth MOTE Kerrin, MD [facility] [phone]   LOS: 7 days    Elspeth JAYSON Kerrin 09/04/2024

## 2024-09-04 NOTE — Consult Note (Signed)
 Urology Consult Note   Requesting Attending Physician:  Fairy Frames, MD Service Providing Consult: Urology  Consulting Attending: Dr. Sherrilee   Reason for Consult:  prostate abscess vs. mass  HPI: [name] [name] is seen in consultation for reasons noted above at the request of . Patient is a 78 y.o. male presenting to [facility] with acute on chronic systolic heart failure and shortness of breath.  PMH significant for biventricular heart failure, paroxysmal A-fib on chronic Eliquis , HTN, hypothyroidism, CAD, severe functional MR, and BPH.  Patient's cardiac function has worsened to the point where he will be requiring a left ventricular assist device.  Heart failure team scan his abdomen in advance of this procedure with incidental finding of 1.8 cm abscess versus mass in the left lobe of the prostate. Urology was consulted to speak to this finding.  Patient was resting comfortably in bed on my arrival.  He reports no lower urinary tract symptoms or previous urologic surgeries.  His wife accompanied us .  We reviewed the available options and natural history of prostatic abscess versus prostate cancer.  All questions were answered to their satisfaction.  ------------------  Assessment:   78 y.o. male with prostate mass vs. abscess requiring w/ permanent a. Fib on chronic blood thinners, biventricular HF, and pending LVAD tomorrow   Recommendations:  #prostate mass vs abscess #elevated PSA  PSA on 09/01/2024-4.3.  Elevated, though not remarkably so.  Does not help differentiate infection from malignancy.  Recheck in 30 days  The most reasonable course of action at this stage would be to proceed with empiric ABX.  If this were an abscess, it is 1.8 cm size can be treated conservatively.  If this does represent a malignancy and we do not see any improvement on repeat imaging, we could consider biopsy +/- initiation of androgen deprivation therapy in the future.  In discussion  with heart failure team, patient may be able to safely pause blood thinners long enough for needle aspiration versus biopsy of the future if necessary.  Patient is asymptomatic, no signs of systemic infection, afebrile, with clear urinalysis at this time.  Family is amenable to proceeding conservatively  Reasonable empiric IV options include ciprofloxacin 400 mg twice daily, Levaquin  750 mg daily, or ceftriaxone  2g daily with reimaging in 2 weeks or sooner should patient acutely decompensate.   Case and plan discussed with Dr. Sherrilee  Past Medical History: Past Medical History:  Diagnosis Date   Atrial fibrillation (HCC)    CHF (congestive heart failure) (HCC)    Cirrhosis (HCC)    ED (erectile dysfunction)    Hyperglycemia    Hyperlipidemia    Hypertension    Internal hemorrhoids    Myocardial infarction (HCC) 2008   SCCA (squamous cell carcinoma) of skin    Left ear.  MOHS surgery 2014 [facility]   Skin cancer of face    2012 basal cell   Thyroid  disease     Past Surgical History:  Past Surgical History:  Procedure Laterality Date   A-Fib Cardiomyopathy [facility])  10/2006   EF 25 - 35%   DOPPLER ECHOCARDIOGRAPHY  10/31/2006   EF 25-35% Mod. M.R., mild -mod TR   RIGHT HEART CATH N/A 09/01/2024   Procedure: RIGHT HEART CATH;  Surgeon: Cherrie Toribio SAUNDERS, MD;  Location: [facility] INVASIVE CV LAB;  Service: Cardiovascular;  Laterality: N/A;   RIGHT/LEFT HEART CATH AND CORONARY ANGIOGRAPHY Bilateral 08/31/2023   Procedure: RIGHT/LEFT HEART CATH AND CORONARY ANGIOGRAPHY;  Surgeon:  End, Lonni, MD;  Location: [facility] INVASIVE CV LAB;  Service: Cardiovascular;  Laterality: Bilateral;    Medication: Current Facility-Administered Medications  Medication Dose Route Frequency Provider Last Rate Last Admin   acetaminophen  (TYLENOL ) tablet 650 mg  650 mg Oral Q6H PRN Howerter, Justin B, DO   650 mg at 09/02/24 2056   Or   acetaminophen  (TYLENOL ) suppository 650 mg  650 mg Rectal Q6H  PRN Howerter, Justin B, DO       amiodarone  (NEXTERONE  PREMIX) 360-4.14 MG/200ML-% (1.8 mg/mL) IV infusion  60 mg/hr Intravenous Continuous Lee, Jordan, NP 33.3 mL/hr at 09/04/24 1237 60 mg/hr at 09/04/24 1237   cefTRIAXone  (ROCEPHIN ) 1 g in sodium chloride  0.9 % 100 mL IVPB  1 g Intravenous Q24H Fairy Frames, MD 200 mL/hr at 09/04/24 1347 1 g at 09/04/24 1347   Chlorhexidine  Gluconate Cloth 2 % PADS 6 each  6 each Topical Daily Pahwani, Ravi, MD   6 each at 09/04/24 9083   doxazosin  (CARDURA ) tablet 2 mg  2 mg Oral Daily Joseph, Preetha, MD   2 mg at 09/04/24 0940   feeding supplement (ENSURE PLUS HIGH PROTEIN) liquid 237 mL  237 mL Oral TID BM Vernon Ranks, MD   237 mL at 09/04/24 9072   fluticasone  furoate-vilanterol (BREO ELLIPTA ) 200-25 MCG/ACT 1 puff  1 puff Inhalation Daily Bensimhon, Toribio SAUNDERS, MD   1 puff at 09/04/24 0835   furosemide  (LASIX ) injection 80 mg  80 mg Intravenous BID McLean, Dalton S, MD   80 mg at 09/04/24 9075   heparin  ADULT infusion 100 units/mL (25000 units/250mL)  950 Units/hr Intravenous Continuous Gretel Prentice BIRCH, RPH 9.5 mL/hr at 09/04/24 0940 950 Units/hr at 09/04/24 0940   levothyroxine  (SYNTHROID ) tablet 137 mcg  137 mcg Oral Q0600 Howerter, Justin B, DO   137 mcg at 09/04/24 0505   melatonin tablet 3 mg  3 mg Oral QHS PRN Howerter, Justin B, DO   3 mg at 09/03/24 2115   milrinone  (PRIMACOR ) 20 MG/100 ML (0.2 mg/mL) infusion  0.375 mcg/kg/min Intravenous Continuous Rolan Ezra RAMAN, MD 8.16 mL/hr at 09/04/24 0954 0.375 mcg/kg/min at 09/04/24 9045   ondansetron  (ZOFRAN ) injection 4 mg  4 mg Intravenous Q6H PRN Howerter, Justin B, DO       pravastatin  (PRAVACHOL ) tablet 40 mg  40 mg Oral Daily Howerter, Justin B, DO   40 mg at 09/04/24 0940   sodium chloride  flush (NS) 0.9 % injection 10-40 mL  10-40 mL Intracatheter Q12H Pahwani, Ranks, MD   20 mL at 09/04/24 9046   sodium chloride  flush (NS) 0.9 % injection 10-40 mL  10-40 mL Intracatheter PRN Vernon Ranks, MD         Allergies: No Known Allergies  Social History: Social History   Tobacco Use   Smoking status: Former    Current packs/day: 0.00    Average packs/day: 0.3 packs/day for 1 year (0.3 ttl pk-yrs)    Types: Cigarettes    Quit date: 02/26/1966    Years since quitting: 58.5   Smokeless tobacco: Never   Tobacco comments:    Less than six months  Vaping Use   Vaping status: Never Used  Substance Use Topics   Alcohol use: Not Currently    Comment: 3-4 glasses of wine per week.   Drug use: Never    Family History Family History  Problem Relation Age of Onset   Hypertension Mother    Cancer Mother        Bilat.  breast dz, s/p mastectomy, Bilat   Colon cancer Mother        possible   Colon polyps Mother    Heart disease Father        CHF, S/P CABG (28) Pacer Incr BP   Hyperlipidemia Father    Heart disease Brother 26       CAD- stent and ICD/pacer   Prostate cancer Neg Hx     Review of Systems  Genitourinary:  Negative for dysuria, flank pain, frequency, hematuria and urgency.     Objective   Vital signs in last 24 hours: BP 114/60 (BP Location: Left Arm)   Pulse 80   Temp 97.8 F (36.6 C) (Oral)   Resp 20   Ht 5' 10 (1.778 m)   Wt 72.5 kg   SpO2 96%   BMI 22.94 kg/m   Physical Exam General: A&O, resting, appropriate HEENT: [place]/AT Pulmonary: Normal work of breathing Cardiovascular: no cyanosis Abdomen:  distended   Most Recent Labs: Lab Results  Component Value Date   WBC 14.3 (H) 09/04/2024   HGB 13.7 09/04/2024   HCT 38.0 (L) 09/04/2024   PLT 170 09/04/2024    Lab Results  Component Value Date   NA 133 (L) 09/04/2024   K 4.5 09/04/2024   CL 95 (L) 09/04/2024   CO2 25 09/04/2024   BUN 46 (H) 09/04/2024   CREATININE 1.99 (H) 09/04/2024   CALCIUM 9.4 09/04/2024   MG 2.0 09/04/2024   PHOS 5.3 (H) 08/28/2024    Lab Results  Component Value Date   INR 1.1 08/28/2024   APTT 35 08/28/2024     Urine  Culture: @[id number] (laburin,org,[id number],[id number])@   IMAGING: DG CHEST PORT 1 VIEW Result Date: 09/04/2024 EXAM: 1 VIEW(S) XRAY OF THE CHEST 09/04/2024 09:18:46 AM COMPARISON: 08/29/2024, CT 09/01/2024. CLINICAL HISTORY: Leucocytosis. FINDINGS: LINES, TUBES AND DEVICES: Right PICC in place with tip terminating over the superior cavoatrial junction, stable. LUNGS AND PLEURA: Perihilar Nodularity similar comparison CT. No pleural effusion. No pneumothorax. HEART AND MEDIASTINUM: Cardiomegaly. Aortic arch atherosclerosis. BONES AND SOFT TISSUES: Surgical clips in right supraclavicular region. No acute osseous abnormality. IMPRESSION: 1. Perihilar nodularity, unchanged from recent CT, without new focal consolidation. 2. Stable enlarged cardiac silhouette Electronically signed by: Norleen Boxer MD 09/04/2024 10:38 AM EST RP Workstation: [id number]    ------  Ole Bourdon, NP Pager: [phone]   Please contact the urology consult pager with any further questions/concerns.

## 2024-09-04 NOTE — Progress Notes (Addendum)
 PROGRESS NOTE    [name] [name]  FMW:[id number] DOB: 1946/04/03 DOA: 08/27/2024 PCP: Cleatus Arlyss RAMAN, MD  78/M with chronic biventricular failure, paroxysmal A-fib, hypothyroidism admitted to [facility] 11/30 with acute on chronic systolic CHF.  In the ED he was noted to be in A-fib RVR, volume overloaded, chest x-ray with pulmonary vascular congestion. - Admitted with low output CHF, initial Co. ox is 47, started on milrinone  - CHF team following, improved filling pressures on right heart cath   Subjective: - A-fib, heart racing when out of breath, Co. ox low this morning, some dyspnea  Assessment and Plan:  Acute on chronic systolic heart failure Severe mitral regurgitation - Last echo 2/25 with EF 25-30%, mildly reduced RV -Repeat echo this admission with EF less than 20%, RV not well visualized -On milrinone  for low output, initial Co. ox 47 -CHF team following, off Coreg , improved filling pressures on right heart cath, plan for repeat Lasix  today, Co. ox 44% this morning, repeat pending -LVAD being considered versus home inotropic therapy   Persistent A-fib with RVR -Heart rate in the 100s, remains on amiodarone  while on milrinone  -Heart rate significantly elevated, Amio dose increased -Continue Eliquis   VT on 12/2 - Continue amiodarone , declined ICD previously - Given additional K, mag is 2.1  AKI on CKD stage IIIa ruled in:   - Baseline creatinine appears to be around 1.3-1.4, now improving, on milrinone .  Bronchitis, worsening leukocytosis - Congested productive cough as well, in addition to above issues, history of bronchitis, will check chest x-ray, urinalysis and blood cultures as well - CBC in a.m. - Addendum, chest x-ray with perihilar opacities, recent CT chest this admission noted right middle lobe opacities, will start ceftriaxone   Prostate mass versus abscess -Clinically do not suspect abscess, UA not suggestive of infection -However considering plan for Impella,  now on antibiotics regardless to cover this as well as bronchitis - Urology input appreciated   Hypothyroidism Continue Synthroid .  TSH within normal limit   Hyperlipidemia Continue pravastatin .   Hyponatremia: Improving   Hypokalemia: Pleated   Chronic thrombocytopenia: Improved.   DVT prophylaxis: Eliquis    Code Status: Full Code  Family Communication: Wife present at bedside Disposition Plan: Home per cards  Antimicrobials:    Objective: Vitals:   09/04/24 0020 09/04/24 0325 09/04/24 0500 09/04/24 0717  BP: 139/80 116/85  131/83  Pulse: 100 90  (!) 112  Resp: 17 17  18   Temp: 97.9 F (36.6 C) 98.3 F (36.8 C)  98.1 F (36.7 C)  TempSrc: Oral Oral  Oral  SpO2: 98% 96%  97%  Weight:   72.5 kg   Height:        Intake/Output Summary (Last 24 hours) at 09/04/2024 1006 Last data filed at 09/04/2024 9092 Gross per 24 hour  Intake 752 ml  Output 700 ml  Net 52 ml   Filed Weights   09/02/24 0450 09/03/24 0500 09/04/24 0500  Weight: 71.2 kg 69.9 kg 72.5 kg    Examination:  General exam: Chronically ill elderly, AAO x 3 HEENT: No JVD Respiratory system: Clear Cardiovascular system: S1 & S2 heard, irregular Abd: nondistended, soft and nontender.bowel sounds present Extremities: no edema Skin: No rashes Psychiatry:  Mood & affect appropriate.     Data Reviewed:   CBC: Recent Labs  Lab 08/31/24 0454 09/01/24 0531 09/01/24 1207 09/02/24 0643 09/03/24 0539 09/04/24 0513  WBC 10.2 10.4  --  10.2 13.5* 14.3*  NEUTROABS 7.1 6.5  --  6.7 10.3*  10.5*  HGB 14.3 13.7 13.9  14.3 13.6 13.0 13.7  HCT 39.8 38.2* 41.0  42.0 37.9* 36.8* 38.0*  MCV 91.7 91.0  --  90.7 91.1 90.9  PLT 111* 134*  --  151 148* 170   Basic Metabolic Panel: Recent Labs  Lab 08/31/24 0454 09/01/24 0531 09/01/24 1207 09/02/24 0643 09/03/24 0539 09/04/24 0513  NA 133* 131* 134*  133* 131* 133* 133*  K 3.1* 3.3* 3.3*  3.4* 3.4* 3.8 4.5  CL 92* 88*  --  91* 95* 95*  CO2 33*  29  --  28 27 25   GLUCOSE 127* 204*  --  164* 118* 114*  BUN 50* 50*  --  51* 44* 46*  CREATININE 1.88* 2.03*  --  2.05* 1.83* 1.99*  CALCIUM 8.4* 8.2*  --  8.3* 9.0 9.4  MG 2.4 2.1  --  2.1 2.1 2.0   GFR: Estimated Creatinine Clearance: 31.4 mL/min (A) (by C-G formula based on SCr of 1.99 mg/dL (H)). Liver Function Tests: Recent Labs  Lab 08/31/24 0454 09/01/24 0531 09/02/24 0643 09/03/24 0539 09/04/24 0513  AST 23 18 18 16 17   ALT 18 16 17 16 16   ALKPHOS 51 54 54 50 58  BILITOT 1.5* 1.0 1.5* 1.4* 1.7*  PROT 6.7 6.5 6.9 6.6 7.1  ALBUMIN 3.7 3.7 4.0 3.7 3.7   No results for input(s): LIPASE, AMYLASE in the last 168 hours. No results for input(s): AMMONIA in the last 168 hours. Coagulation Profile: No results for input(s): INR, PROTIME in the last 168 hours.  Cardiac Enzymes: No results for input(s): CKTOTAL, CKMB, CKMBINDEX, TROPONINI in the last 168 hours.  BNP (last 3 results) No results for input(s): PROBNP in the last 8760 hours. HbA1C: No results for input(s): HGBA1C in the last 72 hours.  CBG: No results for input(s): GLUCAP in the last 168 hours. Lipid Profile: No results for input(s): CHOL, HDL, LDLCALC, TRIG, CHOLHDL, LDLDIRECT in the last 72 hours.  Thyroid  Function Tests: No results for input(s): TSH, T4TOTAL, FREET4, T3FREE, THYROIDAB in the last 72 hours.  Anemia Panel: No results for input(s): VITAMINB12, FOLATE, FERRITIN, TIBC, IRON, RETICCTPCT in the last 72 hours. Urine analysis:    Component Value Date/Time   COLORURINE YELLOW 08/27/2024 2048   APPEARANCEUR CLEAR 08/27/2024 2048   LABSPEC 1.010 08/27/2024 2048   PHURINE 5.5 08/27/2024 2048   GLUCOSEU >=500 (A) 08/27/2024 2048   HGBUR TRACE (A) 08/27/2024 2048   BILIRUBINUR NEGATIVE 08/27/2024 2048   KETONESUR 15 (A) 08/27/2024 2048   PROTEINUR NEGATIVE 08/27/2024 2048   NITRITE NEGATIVE 08/27/2024 2048   LEUKOCYTESUR NEGATIVE  08/27/2024 2048   Sepsis Labs: @LABRCNTIP (procalcitonin:4,lacticidven:4)  ) Recent Results (from the past 240 hours)  Resp panel by RT-PCR (RSV, Flu A&B, Covid) Anterior Nasal Swab     Status: None   Collection Time: 08/27/24  7:07 PM   Specimen: Anterior Nasal Swab  Result Value Ref Range Status   SARS Coronavirus 2 by RT PCR NEGATIVE NEGATIVE Final   Influenza A by PCR NEGATIVE NEGATIVE Final   Influenza B by PCR NEGATIVE NEGATIVE Final    Comment: (NOTE) The Xpert Xpress SARS-CoV-2/FLU/RSV plus assay is intended as an aid in the diagnosis of influenza from Nasopharyngeal swab specimens and should not be used as a sole basis for treatment. Nasal washings and aspirates are unacceptable for Xpert Xpress SARS-CoV-2/FLU/RSV testing.  Fact Sheet for Patients: bloggercourse.com  Fact Sheet for Healthcare Providers: seriousbroker.it  This test is not yet approved  or cleared by the [place]  FDA and has been authorized for detection and/or diagnosis of SARS-CoV-2 by FDA under an Emergency Use Authorization (EUA). This EUA will remain in effect (meaning this test can be used) for the duration of the COVID-19 declaration under Section 564(b)(1) of the Act, 21 U.S.C. section 360bbb-3(b)(1), unless the authorization is terminated or revoked.     Resp Syncytial Virus by PCR NEGATIVE NEGATIVE Final    Comment: (NOTE) Fact Sheet for Patients: bloggercourse.com  Fact Sheet for Healthcare Providers: seriousbroker.it  This test is not yet approved or cleared by the [place]  FDA and has been authorized for detection and/or diagnosis of SARS-CoV-2 by FDA under an Emergency Use Authorization (EUA). This EUA will remain in effect (meaning this test can be used) for the duration of the COVID-19 declaration under Section 564(b)(1) of the Act, 21 U.S.C. section 360bbb-3(b)(1), unless  the authorization is terminated or revoked.  Performed at [facility], [address], [place], [place] [address]   Culture, blood (routine x 2)     Status: None   Collection Time: 08/27/24  7:15 PM   Specimen: BLOOD RIGHT HAND  Result Value Ref Range Status   Specimen Description BLOOD RIGHT HAND  Final   Special Requests   Final    BOTTLES DRAWN AEROBIC AND ANAEROBIC Blood Culture results may not be optimal due to an inadequate volume of blood received in culture bottles   Culture   Final    NO GROWTH 5 DAYS Performed at [facility], [address], [place], [place] [address]    Report Status 09/01/2024 FINAL  Final  Culture, blood (routine x 2)     Status: None   Collection Time: 08/27/24  7:20 PM   Specimen: BLOOD RIGHT FOREARM  Result Value Ref Range Status   Specimen Description BLOOD RIGHT FOREARM  Final   Special Requests   Final    BOTTLES DRAWN AEROBIC AND ANAEROBIC Blood Culture adequate volume   Culture   Final    NO GROWTH 5 DAYS Performed at [facility], [address], [place], [place] [address]    Report Status 09/01/2024 FINAL  Final     Radiology Studies: No results found.    Scheduled Meds:  Chlorhexidine  Gluconate Cloth  6 each Topical Daily   doxazosin   2 mg Oral Daily   feeding supplement  237 mL Oral TID BM   fluticasone  furoate-vilanterol  1 puff Inhalation Daily   furosemide   80 mg Intravenous BID   levothyroxine   137 mcg Oral Q0600   pravastatin   40 mg Oral Daily   sodium chloride  flush  10-40 mL Intracatheter Q12H   Continuous Infusions:  amiodarone  60 mg/hr (09/04/24 0829)   heparin  950 Units/hr (09/04/24 0940)   milrinone  0.375 mcg/kg/min (09/04/24 0954)     LOS: 7 days    Time spent:    Sigurd Pac, MD [facility]   09/04/2024, 10:06 AM

## 2024-09-05 ENCOUNTER — Encounter (HOSPITAL_COMMUNITY): Payer: Self-pay | Admitting: Anesthesiology

## 2024-09-05 ENCOUNTER — Inpatient Hospital Stay (HOSPITAL_COMMUNITY)

## 2024-09-05 ENCOUNTER — Encounter (HOSPITAL_COMMUNITY): Payer: Self-pay | Admitting: Internal Medicine

## 2024-09-05 ENCOUNTER — Encounter (HOSPITAL_COMMUNITY): Admission: EM | Disposition: E | Payer: Self-pay | Source: Home / Self Care | Attending: Cardiology

## 2024-09-05 ENCOUNTER — Inpatient Hospital Stay (HOSPITAL_COMMUNITY): Payer: Self-pay | Admitting: Anesthesiology

## 2024-09-05 DIAGNOSIS — I110 Hypertensive heart disease with heart failure: Secondary | ICD-10-CM | POA: Diagnosis not present

## 2024-09-05 DIAGNOSIS — N179 Acute kidney failure, unspecified: Secondary | ICD-10-CM

## 2024-09-05 DIAGNOSIS — Z87891 Personal history of nicotine dependence: Secondary | ICD-10-CM

## 2024-09-05 DIAGNOSIS — I5023 Acute on chronic systolic (congestive) heart failure: Secondary | ICD-10-CM | POA: Diagnosis not present

## 2024-09-05 DIAGNOSIS — I340 Nonrheumatic mitral (valve) insufficiency: Secondary | ICD-10-CM | POA: Diagnosis not present

## 2024-09-05 DIAGNOSIS — N1831 Chronic kidney disease, stage 3a: Secondary | ICD-10-CM | POA: Diagnosis not present

## 2024-09-05 DIAGNOSIS — I2510 Atherosclerotic heart disease of native coronary artery without angina pectoris: Secondary | ICD-10-CM | POA: Diagnosis not present

## 2024-09-05 DIAGNOSIS — I509 Heart failure, unspecified: Secondary | ICD-10-CM | POA: Diagnosis not present

## 2024-09-05 DIAGNOSIS — R570 Cardiogenic shock: Secondary | ICD-10-CM | POA: Diagnosis not present

## 2024-09-05 DIAGNOSIS — E871 Hypo-osmolality and hyponatremia: Secondary | ICD-10-CM | POA: Diagnosis not present

## 2024-09-05 DIAGNOSIS — Z9911 Dependence on respirator [ventilator] status: Secondary | ICD-10-CM

## 2024-09-05 DIAGNOSIS — I4821 Permanent atrial fibrillation: Secondary | ICD-10-CM | POA: Diagnosis not present

## 2024-09-05 HISTORY — PX: TEE WITHOUT CARDIOVERSION: SHX5443

## 2024-09-05 HISTORY — PX: PLACEMENT OF IMPELLA LEFT VENTRICULAR ASSIST DEVICE: SHX6519

## 2024-09-05 LAB — COMPREHENSIVE METABOLIC PANEL WITH GFR
ALT: 12 U/L (ref 0–44)
AST: 15 U/L (ref 15–41)
Albumin: 3.1 g/dL — ABNORMAL LOW (ref 3.5–5.0)
Alkaline Phosphatase: 46 U/L (ref 38–126)
Anion gap: 12 (ref 5–15)
BUN: 50 mg/dL — ABNORMAL HIGH (ref 8–23)
CO2: 25 mmol/L (ref 22–32)
Calcium: 8.1 mg/dL — ABNORMAL LOW (ref 8.9–10.3)
Chloride: 92 mmol/L — ABNORMAL LOW (ref 98–111)
Creatinine, Ser: 2.34 mg/dL — ABNORMAL HIGH (ref 0.61–1.24)
GFR, Estimated: 28 mL/min — ABNORMAL LOW (ref >60)
Glucose, Bld: 156 mg/dL — ABNORMAL HIGH (ref 70–99)
Potassium: 3.4 mmol/L — ABNORMAL LOW (ref 3.5–5.1)
Sodium: 129 mmol/L — ABNORMAL LOW (ref 135–145)
Total Bilirubin: 1.3 mg/dL — ABNORMAL HIGH (ref 0.0–1.2)
Total Protein: 5.7 g/dL — ABNORMAL LOW (ref 6.5–8.1)

## 2024-09-05 LAB — POCT I-STAT 7, (LYTES, BLD GAS, ICA,H+H)
Acid-Base Excess: 0 mmol/L (ref 0.0–2.0)
Acid-Base Excess: 3 mmol/L — ABNORMAL HIGH (ref 0.0–2.0)
Acid-Base Excess: 3 mmol/L — ABNORMAL HIGH (ref 0.0–2.0)
Acid-Base Excess: 2 mmol/L (ref 0.0–2.0)
Bicarbonate: 24.3 mmol/L (ref 20.0–28.0)
Bicarbonate: 26.9 mmol/L (ref 20.0–28.0)
Bicarbonate: 26.6 mmol/L (ref 20.0–28.0)
Bicarbonate: 26.9 mmol/L (ref 20.0–28.0)
Calcium, Ion: 1.12 mmol/L — ABNORMAL LOW (ref 1.15–1.40)
Calcium, Ion: 1.12 mmol/L — ABNORMAL LOW (ref 1.15–1.40)
Calcium, Ion: 1.14 mmol/L — ABNORMAL LOW (ref 1.15–1.40)
Calcium, Ion: 1.14 mmol/L — ABNORMAL LOW (ref 1.15–1.40)
HCT: 34 % — ABNORMAL LOW (ref 39.0–52.0)
HCT: 32 % — ABNORMAL LOW (ref 39.0–52.0)
HCT: 34 % — ABNORMAL LOW (ref 39.0–52.0)
HCT: 32 % — ABNORMAL LOW (ref 39.0–52.0)
Hemoglobin: 11.6 g/dL — ABNORMAL LOW (ref 13.0–17.0)
Hemoglobin: 10.9 g/dL — ABNORMAL LOW (ref 13.0–17.0)
Hemoglobin: 11.6 g/dL — ABNORMAL LOW (ref 13.0–17.0)
Hemoglobin: 10.9 g/dL — ABNORMAL LOW (ref 13.0–17.0)
O2 Saturation: 100 %
O2 Saturation: 100 %
O2 Saturation: 100 %
O2 Saturation: 93 %
Patient temperature: 35.6
Patient temperature: 36.1
Patient temperature: 36.5
Potassium: 3.4 mmol/L — ABNORMAL LOW (ref 3.5–5.1)
Potassium: 3.5 mmol/L (ref 3.5–5.1)
Potassium: 3.6 mmol/L (ref 3.5–5.1)
Potassium: 4 mmol/L (ref 3.5–5.1)
Sodium: 129 mmol/L — ABNORMAL LOW (ref 135–145)
Sodium: 129 mmol/L — ABNORMAL LOW (ref 135–145)
Sodium: 130 mmol/L — ABNORMAL LOW (ref 135–145)
Sodium: 129 mmol/L — ABNORMAL LOW (ref 135–145)
TCO2: 25 mmol/L (ref 22–32)
TCO2: 28 mmol/L (ref 22–32)
TCO2: 28 mmol/L (ref 22–32)
TCO2: 28 mmol/L (ref 22–32)
pCO2 arterial: 37.5 mmHg (ref 32–48)
pCO2 arterial: 34.5 mmHg (ref 32–48)
pCO2 arterial: 35.4 mmHg (ref 32–48)
pCO2 arterial: 40.1 mmHg (ref 32–48)
pH, Arterial: 7.42 (ref 7.35–7.45)
pH, Arterial: 7.494 — ABNORMAL HIGH (ref 7.35–7.45)
pH, Arterial: 7.481 — ABNORMAL HIGH (ref 7.35–7.45)
pH, Arterial: 7.432 (ref 7.35–7.45)
pO2, Arterial: 251 mmHg — ABNORMAL HIGH (ref 83–108)
pO2, Arterial: 159 mmHg — ABNORMAL HIGH (ref 83–108)
pO2, Arterial: 164 mmHg — ABNORMAL HIGH (ref 83–108)
pO2, Arterial: 64 mmHg — ABNORMAL LOW (ref 83–108)

## 2024-09-05 LAB — FACTOR 5 LEIDEN

## 2024-09-05 LAB — CBC WITH DIFFERENTIAL/PLATELET
Abs Immature Granulocytes: 0.16 K/uL — ABNORMAL HIGH (ref 0.00–0.07)
Basophils Absolute: 0.1 K/uL (ref 0.0–0.1)
Basophils Relative: 0 %
Eosinophils Absolute: 0.1 K/uL (ref 0.0–0.5)
Eosinophils Relative: 1 %
HCT: 30.8 % — ABNORMAL LOW (ref 39.0–52.0)
Hemoglobin: 11.3 g/dL — ABNORMAL LOW (ref 13.0–17.0)
Immature Granulocytes: 1 %
Lymphocytes Relative: 9 %
Lymphs Abs: 1 K/uL (ref 0.7–4.0)
MCH: 33.1 pg (ref 26.0–34.0)
MCHC: 36.7 g/dL — ABNORMAL HIGH (ref 30.0–36.0)
MCV: 90.3 fL (ref 80.0–100.0)
Monocytes Absolute: 1.2 K/uL — ABNORMAL HIGH (ref 0.1–1.0)
Monocytes Relative: 10 %
Neutro Abs: 9.6 K/uL — ABNORMAL HIGH (ref 1.7–7.7)
Neutrophils Relative %: 79 %
Platelets: 144 K/uL — ABNORMAL LOW (ref 150–400)
RBC: 3.41 MIL/uL — ABNORMAL LOW (ref 4.22–5.81)
RDW: 12.2 % (ref 11.5–15.5)
WBC: 12.2 K/uL — ABNORMAL HIGH (ref 4.0–10.5)
nRBC: 0 % (ref 0.0–0.2)

## 2024-09-05 LAB — ECHO INTRAOPERATIVE TEE
Height: 70 in
Weight: 2578.5 [oz_av]

## 2024-09-05 LAB — POCT I-STAT, CHEM 8
BUN: 48 mg/dL — ABNORMAL HIGH (ref 8–23)
Calcium, Ion: 1.16 mmol/L (ref 1.15–1.40)
Chloride: 90 mmol/L — ABNORMAL LOW (ref 98–111)
Creatinine, Ser: 2.6 mg/dL — ABNORMAL HIGH (ref 0.61–1.24)
Glucose, Bld: 175 mg/dL — ABNORMAL HIGH (ref 70–99)
HCT: 36 % — ABNORMAL LOW (ref 39.0–52.0)
Hemoglobin: 12.2 g/dL — ABNORMAL LOW (ref 13.0–17.0)
Potassium: 3.4 mmol/L — ABNORMAL LOW (ref 3.5–5.1)
Sodium: 131 mmol/L — ABNORMAL LOW (ref 135–145)
TCO2: 22 mmol/L (ref 22–32)

## 2024-09-05 LAB — COOXEMETRY PANEL
Carboxyhemoglobin: 0.8 % (ref 0.5–1.5)
Carboxyhemoglobin: 1.7 % — ABNORMAL HIGH (ref 0.5–1.5)
Methemoglobin: <0.7 % (ref 0.0–1.5)
Methemoglobin: <0.7 % (ref 0.0–1.5)
O2 Saturation: 50.9 %
O2 Saturation: 68.1 %
Total hemoglobin: 13.5 g/dL (ref 12.0–16.0)
Total hemoglobin: 11.1 g/dL — ABNORMAL LOW (ref 12.0–16.0)

## 2024-09-05 LAB — BASIC METABOLIC PANEL WITH GFR
Anion gap: 11 (ref 5–15)
BUN: 49 mg/dL — ABNORMAL HIGH (ref 8–23)
CO2: 25 mmol/L (ref 22–32)
Calcium: 8.1 mg/dL — ABNORMAL LOW (ref 8.9–10.3)
Chloride: 92 mmol/L — ABNORMAL LOW (ref 98–111)
Creatinine, Ser: 2.39 mg/dL — ABNORMAL HIGH (ref 0.61–1.24)
GFR, Estimated: 27 mL/min — ABNORMAL LOW (ref >60)
Glucose, Bld: 122 mg/dL — ABNORMAL HIGH (ref 70–99)
Potassium: 4.1 mmol/L (ref 3.5–5.1)
Sodium: 128 mmol/L — ABNORMAL LOW (ref 135–145)

## 2024-09-05 LAB — MRSA NEXT GEN BY PCR, NASAL: MRSA by PCR Next Gen: NOT DETECTED

## 2024-09-05 LAB — ECHOCARDIOGRAM LIMITED
Est EF: <20
Height: 70 in
Weight: 2578.5 [oz_av]

## 2024-09-05 LAB — GLUCOSE, CAPILLARY
Glucose-Capillary: 143 mg/dL — ABNORMAL HIGH (ref 70–99)
Glucose-Capillary: 120 mg/dL — ABNORMAL HIGH (ref 70–99)
Glucose-Capillary: 134 mg/dL — ABNORMAL HIGH (ref 70–99)
Glucose-Capillary: 118 mg/dL — ABNORMAL HIGH (ref 70–99)

## 2024-09-05 LAB — MAGNESIUM: Magnesium: 1.9 mg/dL (ref 1.7–2.4)

## 2024-09-05 LAB — LACTATE DEHYDROGENASE: LDH: 115 U/L (ref 105–235)

## 2024-09-05 LAB — PHOSPHORUS: Phosphorus: 3.8 mg/dL (ref 2.5–4.6)

## 2024-09-05 LAB — FIBRINOGEN: Fibrinogen: 464 mg/dL (ref 210–475)

## 2024-09-05 MED ORDER — ROCURONIUM BROMIDE 10 MG/ML (PF) SYRINGE
PREFILLED_SYRINGE | INTRAVENOUS | Status: AC
  Filled 2024-09-05: qty 20

## 2024-09-05 MED ORDER — ACETAMINOPHEN 325 MG PO TABS: 650.0000 mg | ORAL_TABLET | ORAL | Status: DC | PRN

## 2024-09-05 MED ORDER — PHENYLEPHRINE 80 MCG/ML (10ML) SYRINGE FOR IV PUSH (FOR BLOOD PRESSURE SUPPORT)
PREFILLED_SYRINGE | INTRAVENOUS | Status: DC | PRN
  Administered 2024-09-05 (×4): 80 ug via INTRAVENOUS

## 2024-09-05 MED ORDER — SODIUM CHLORIDE 0.9% FLUSH
10.0000 mL | Freq: Two times a day (BID) | INTRAVENOUS | Status: DC
  Administered 2024-09-05 – 2024-09-06 (×4): 10 mL
  Administered 2024-09-07: 20 mL
  Administered 2024-09-07: 10 mL
  Administered 2024-09-08: 20 mL
  Administered 2024-09-09: 10 mL
  Administered 2024-09-10: 20 mL
  Administered 2024-09-11: 10:00:00 10 mL

## 2024-09-05 MED ORDER — ACETAMINOPHEN 650 MG RE SUPP: 650.0000 mg | RECTAL | Status: DC | PRN

## 2024-09-05 MED ORDER — FENTANYL CITRATE (PF) 50 MCG/ML IJ SOSY
25.0000 ug | PREFILLED_SYRINGE | INTRAMUSCULAR | Status: DC | PRN
  Filled 2024-09-05: qty 1

## 2024-09-05 MED ORDER — POLYETHYLENE GLYCOL 3350 17 G PO PACK
17.0000 g | PACK | Freq: Every day | ORAL | Status: DC
  Administered 2024-09-06 – 2024-09-07 (×2): 17 g via ORAL
  Filled 2024-09-05 (×2): qty 1

## 2024-09-05 MED ORDER — HEPARIN SODIUM (PORCINE) 1000 UNIT/ML IJ SOLN
INTRAMUSCULAR | Status: DC | PRN
  Administered 2024-09-05: 4000 [IU] via INTRAVENOUS
  Administered 2024-09-05: 5000 [IU] via INTRAVENOUS

## 2024-09-05 MED ORDER — CEFAZOLIN SODIUM-DEXTROSE 2-3 GM-%(50ML) IV SOLR
INTRAVENOUS | Status: DC | PRN
  Administered 2024-09-05: 2 g via INTRAVENOUS

## 2024-09-05 MED ORDER — SODIUM BICARBONATE 8.4 % IV SOLN
INTRAVENOUS | Status: DC
  Filled 2024-09-05 (×2): qty 25

## 2024-09-05 MED ORDER — DEXMEDETOMIDINE HCL IN NACL 400 MCG/100ML IV SOLN
INTRAVENOUS | Status: DC | PRN
  Administered 2024-09-05: .4 ug/kg/h via INTRAVENOUS

## 2024-09-05 MED ORDER — INSULIN ASPART 100 UNIT/ML IJ SOLN
0.0000 [IU] | INTRAMUSCULAR | Status: DC
  Administered 2024-09-05 – 2024-09-06 (×3): 1 [IU] via SUBCUTANEOUS
  Administered 2024-09-06: 2 [IU] via SUBCUTANEOUS
  Filled 2024-09-05: qty 1
  Filled 2024-09-05: qty 2
  Filled 2024-09-05 (×2): qty 1

## 2024-09-05 MED ORDER — SODIUM BICARBONATE 8.4 % IV SOLN
INTRAVENOUS | Status: DC
  Filled 2024-09-05: qty 25

## 2024-09-05 MED ORDER — TRANEXAMIC ACID 1000 MG/10ML IV SOLN
1.5000 mg/kg/h | INTRAVENOUS | Status: DC
  Filled 2024-09-05: qty 25

## 2024-09-05 MED ORDER — LACTATED RINGERS IV SOLN: INTRAVENOUS | Status: DC | PRN

## 2024-09-05 MED ORDER — MILRINONE LACTATE IN DEXTROSE 20-5 MG/100ML-% IV SOLN
0.2500 ug/kg/min | INTRAVENOUS | Status: DC
  Administered 2024-09-05 – 2024-09-11 (×8): 0.25 ug/kg/min via INTRAVENOUS
  Filled 2024-09-05 (×7): qty 100

## 2024-09-05 MED ORDER — TRANEXAMIC ACID (OHS) BOLUS VIA INFUSION
15.0000 mg/kg | INTRAVENOUS | Status: DC
  Filled 2024-09-05: qty 1097

## 2024-09-05 MED ORDER — LEVOTHYROXINE SODIUM 137 MCG PO TABS
137.0000 ug | ORAL_TABLET | Freq: Every day | ORAL | Status: DC
  Filled 2024-09-05: qty 1

## 2024-09-05 MED ORDER — CHLORHEXIDINE GLUCONATE CLOTH 2 % EX PADS
6.0000 | MEDICATED_PAD | Freq: Every day | CUTANEOUS | Status: DC
  Administered 2024-09-05 – 2024-09-11 (×7): 6 via TOPICAL

## 2024-09-05 MED ORDER — ONDANSETRON HCL 4 MG/2ML IJ SOLN
4.0000 mg | Freq: Four times a day (QID) | INTRAMUSCULAR | Status: DC | PRN
  Administered 2024-09-06 – 2024-09-07 (×4): 4 mg via INTRAVENOUS
  Filled 2024-09-05 (×4): qty 2

## 2024-09-05 MED ORDER — DEXMEDETOMIDINE HCL IN NACL 400 MCG/100ML IV SOLN: 0.0000 ug/kg/h | INTRAVENOUS | Status: DC

## 2024-09-05 MED ORDER — PRAVASTATIN SODIUM 40 MG PO TABS
40.0000 mg | ORAL_TABLET | Freq: Every day | ORAL | Status: DC
  Administered 2024-09-05 – 2024-09-10 (×6): 40 mg via ORAL
  Filled 2024-09-05 (×6): qty 1

## 2024-09-05 MED ORDER — POLYETHYLENE GLYCOL 3350 17 G PO PACK
17.0000 g | PACK | Freq: Every day | ORAL | Status: DC
  Administered 2024-09-05: 17 g
  Filled 2024-09-05: qty 1

## 2024-09-05 MED ORDER — HEMOSTATIC AGENTS (NO CHARGE) OPTIME
TOPICAL | Status: DC | PRN
  Administered 2024-09-05 (×2): 1 via TOPICAL

## 2024-09-05 MED ORDER — ENSURE PLUS HIGH PROTEIN PO LIQD
237.0000 mL | Freq: Two times a day (BID) | ORAL | Status: DC
  Administered 2024-09-07 – 2024-09-08 (×4): 237 mL via ORAL

## 2024-09-05 MED ORDER — PRAVASTATIN SODIUM 40 MG PO TABS: 40.0000 mg | ORAL_TABLET | Freq: Every day | ORAL | Status: DC

## 2024-09-05 MED ORDER — SUGAMMADEX SODIUM 200 MG/2ML IV SOLN
INTRAVENOUS | Status: DC | PRN
  Administered 2024-09-05: 100 mg via INTRAVENOUS

## 2024-09-05 MED ORDER — FENTANYL CITRATE (PF) 250 MCG/5ML IJ SOLN
INTRAMUSCULAR | Status: DC | PRN
  Administered 2024-09-05 (×4): 50 ug via INTRAVENOUS
  Administered 2024-09-05: 100 ug via INTRAVENOUS
  Administered 2024-09-05: 150 ug via INTRAVENOUS

## 2024-09-05 MED ORDER — HEPARIN 6000 UNIT IRRIGATION SOLUTION
Status: AC
  Filled 2024-09-05: qty 500

## 2024-09-05 MED ORDER — SODIUM CHLORIDE 0.9% IV SOLUTION: INTRAVENOUS | Status: DC | PRN

## 2024-09-05 MED ORDER — ETOMIDATE 2 MG/ML IV SOLN
INTRAVENOUS | Status: AC
  Filled 2024-09-05: qty 10

## 2024-09-05 MED ORDER — 0.9 % SODIUM CHLORIDE (POUR BTL) OPTIME
TOPICAL | Status: DC | PRN
  Administered 2024-09-05: 1000 mL
  Administered 2024-09-05: 2000 mL

## 2024-09-05 MED ORDER — ONDANSETRON HCL 4 MG/2ML IJ SOLN
INTRAMUSCULAR | Status: AC
  Filled 2024-09-05: qty 2

## 2024-09-05 MED ORDER — HEPARIN SODIUM (PORCINE) 1000 UNIT/ML IJ SOLN
INTRAMUSCULAR | Status: AC
  Filled 2024-09-05: qty 30

## 2024-09-05 MED ORDER — POLYETHYLENE GLYCOL 3350 17 G PO PACK: 17.0000 g | PACK | Freq: Every day | ORAL | Status: DC | PRN

## 2024-09-05 MED ORDER — ACETAMINOPHEN 160 MG/5ML PO SOLN: 650.0000 mg | ORAL | Status: DC | PRN

## 2024-09-05 MED ORDER — MIDAZOLAM HCL (PF) 2 MG/2ML IJ SOLN: 0.5000 mg | INTRAMUSCULAR | Status: DC | PRN

## 2024-09-05 MED ORDER — ONDANSETRON HCL 4 MG/2ML IJ SOLN
INTRAMUSCULAR | Status: DC | PRN
  Administered 2024-09-05: 4 mg via INTRAVENOUS

## 2024-09-05 MED ORDER — MIDAZOLAM HCL 5 MG/5ML IJ SOLN
INTRAMUSCULAR | Status: DC | PRN
  Administered 2024-09-05: 2 mg via INTRAVENOUS

## 2024-09-05 MED ORDER — PHENYLEPHRINE 80 MCG/ML (10ML) SYRINGE FOR IV PUSH (FOR BLOOD PRESSURE SUPPORT)
PREFILLED_SYRINGE | INTRAVENOUS | Status: AC
  Filled 2024-09-05: qty 10

## 2024-09-05 MED ORDER — ARFORMOTEROL TARTRATE 15 MCG/2ML IN NEBU
15.0000 ug | INHALATION_SOLUTION | Freq: Two times a day (BID) | RESPIRATORY_TRACT | Status: DC
  Administered 2024-09-05 – 2024-09-11 (×12): 15 ug via RESPIRATORY_TRACT
  Filled 2024-09-05 (×12): qty 2

## 2024-09-05 MED ORDER — HEPARIN 6000 UNIT IRRIGATION SOLUTION
Status: DC | PRN
  Administered 2024-09-05: 1

## 2024-09-05 MED ORDER — REVEFENACIN 175 MCG/3ML IN SOLN
175.0000 ug | Freq: Every day | RESPIRATORY_TRACT | Status: DC
  Administered 2024-09-06 – 2024-09-11 (×5): 175 ug via RESPIRATORY_TRACT
  Filled 2024-09-05 (×6): qty 3

## 2024-09-05 MED ORDER — SODIUM CHLORIDE 0.9% FLUSH: 10.0000 mL | INTRAVENOUS | Status: DC | PRN

## 2024-09-05 MED ORDER — DOCUSATE SODIUM 50 MG/5ML PO LIQD
100.0000 mg | Freq: Two times a day (BID) | ORAL | Status: DC
  Administered 2024-09-05: 100 mg
  Filled 2024-09-05: qty 10

## 2024-09-05 MED ORDER — MAGNESIUM SULFATE 2 GM/50ML IV SOLN
2.0000 g | Freq: Once | INTRAVENOUS | Status: AC
  Administered 2024-09-05: 2 g via INTRAVENOUS
  Filled 2024-09-05: qty 50

## 2024-09-05 MED ORDER — PANTOPRAZOLE SODIUM 40 MG IV SOLR
40.0000 mg | Freq: Every day | INTRAVENOUS | Status: DC
  Administered 2024-09-05 – 2024-09-06 (×2): 40 mg via INTRAVENOUS
  Filled 2024-09-05 (×2): qty 10

## 2024-09-05 MED ORDER — DOCUSATE SODIUM 100 MG PO CAPS
100.0000 mg | ORAL_CAPSULE | Freq: Two times a day (BID) | ORAL | Status: DC | PRN
  Administered 2024-09-06 – 2024-09-07 (×2): 100 mg via ORAL
  Filled 2024-09-05 (×2): qty 1

## 2024-09-05 MED ORDER — ETOMIDATE 2 MG/ML IV SOLN
INTRAVENOUS | Status: DC | PRN
  Administered 2024-09-05: 10 mg via INTRAVENOUS

## 2024-09-05 MED ORDER — TRANEXAMIC ACID (OHS) PUMP PRIME SOLUTION
2.0000 mg/kg | INTRAVENOUS | Status: DC
  Filled 2024-09-05: qty 1.46

## 2024-09-05 MED ORDER — FENTANYL CITRATE (PF) 250 MCG/5ML IJ SOLN
INTRAMUSCULAR | Status: AC
  Filled 2024-09-05: qty 5

## 2024-09-05 MED ORDER — POTASSIUM CHLORIDE 10 MEQ/50ML IV SOLN: 10.0000 meq | INTRAVENOUS | Status: DC

## 2024-09-05 MED ORDER — EPINEPHRINE 1 MG/10ML IV SOSY
PREFILLED_SYRINGE | INTRAVENOUS | Status: AC
  Filled 2024-09-05: qty 10

## 2024-09-05 MED ORDER — SODIUM CHLORIDE (PF) 0.9 % IJ SOLN
INTRAMUSCULAR | Status: AC
  Filled 2024-09-05: qty 10

## 2024-09-05 MED ORDER — DOCUSATE SODIUM 100 MG PO CAPS
100.0000 mg | ORAL_CAPSULE | Freq: Two times a day (BID) | ORAL | Status: DC
  Administered 2024-09-05: 100 mg via ORAL
  Filled 2024-09-05: qty 1

## 2024-09-05 MED ORDER — LEVOTHYROXINE SODIUM 137 MCG PO TABS
137.0000 ug | ORAL_TABLET | Freq: Every day | ORAL | Status: DC
  Administered 2024-09-06 – 2024-09-10 (×5): 137 ug via ORAL
  Filled 2024-09-05 (×7): qty 1

## 2024-09-05 MED ORDER — POTASSIUM CHLORIDE 10 MEQ/50ML IV SOLN
10.0000 meq | INTRAVENOUS | Status: AC
  Administered 2024-09-05 (×4): 10 meq via INTRAVENOUS
  Filled 2024-09-05 (×4): qty 50

## 2024-09-05 MED ORDER — OXYCODONE HCL 5 MG PO TABS
5.0000 mg | ORAL_TABLET | Freq: Four times a day (QID) | ORAL | Status: DC | PRN
  Administered 2024-09-05 – 2024-09-06 (×2): 5 mg via ORAL
  Filled 2024-09-05 (×2): qty 1

## 2024-09-05 MED ORDER — VASOPRESSIN 20 UNIT/ML IV SOLN
INTRAVENOUS | Status: AC
  Filled 2024-09-05: qty 1

## 2024-09-05 MED ORDER — ROCURONIUM BROMIDE 10 MG/ML (PF) SYRINGE
PREFILLED_SYRINGE | INTRAVENOUS | Status: DC | PRN
  Administered 2024-09-05 (×2): 20 mg via INTRAVENOUS
  Administered 2024-09-05: 100 mg via INTRAVENOUS

## 2024-09-05 MED ORDER — MIDAZOLAM HCL 2 MG/2ML IJ SOLN
INTRAMUSCULAR | Status: AC
  Filled 2024-09-05: qty 2

## 2024-09-05 MED ORDER — MELATONIN 3 MG PO TABS
3.0000 mg | ORAL_TABLET | Freq: Every evening | ORAL | Status: DC | PRN
  Administered 2024-09-05: 3 mg via ORAL
  Filled 2024-09-05: qty 1

## 2024-09-05 NOTE — Consult Note (Addendum)
 NAME:  [name] [name], MRN:  [id number], DOB:  15-Feb-1946, LOS: 8 ADMISSION DATE:  08/27/2024, CONSULTATION DATE:  12/9 REFERRING MD:  Zenaida, AHF CHIEF COMPLAINT: biventricular heart failure    History of Present Illness:  78 year old male with past medical history of hypothyroidism, hypertension, hyperlipidemia, paroxysmal atrial fibrillation on Eliquis , chronic biventricular heart failure, ischemic cardiomyopathy, CAD, severe functional MR who was initially admitted on 12/1 with shortness of breath.  Prior to arrival had 3 days of worsening shortness of breath, cough, peripheral edema.  In the ED was in A-fib RVR with rates up to 160s, K5.5, bicarb 19, SCR 1.49, BNP 1633, troponin 11> 15, lactic 2.  Chest x-ray with vascular congestion.  He was admitted to hospitalist and started on diuresis.  Cardiology and heart failure saw patient on 12/2.  PICC placement was ordered for Co. ox assessment, plan for RHC once stabilized.  On 12/3 after PICC was placed, Co. ox 49, Fick CI 1.02.  He was started on milrinone  0.25, continued on Lasix , Diamox , started on IV Amio.  He had a right heart cath on 12/5, T CTS consult for bad workup.  While on milrinone  had brief improvement in Co. ox until 12/8 when he had drop in [place] to 44%, weight up 5 pounds, increased A-fib rates and was given diuresis.  TCTS was asked for Impella 5.5 placement consult.  Impella was placed on 12/9 and patient was taken to ICU postoperatively.  CCM consulted for help with medical management.  Pertinent  Medical History  hypothyroidism, hypertension, hyperlipidemia, paroxysmal atrial fibrillation on Eliquis , chronic biventricular heart failure, ischemic cardiomyopathy, CAD, severe functional MR  Significant Hospital Events: Including procedures, antibiotic start and stop dates in addition to other pertinent events   12/1: admit for AoC systolic heart failure, started on diuresis  12/2: cardiology and AHF consults  12/3: PICC placed,  coox 49, CI 1.02 > milrinone , ongoing diuresis  12/5: RHC RA 4, RV 39/5, PA 37/24(28), PCW 20, Fick CI 2.4, PAPi 3.25 on milrinone  0.25; TCTS consult for VAD work up  12/8: coox 44% dropped back off, weight up 5lb, afib rate up, given diuresis; consult to TCTS for impella 5.5 placement  12/9: went for impella 5.5>ICU post-op, CCM consult  Interim History / Subjective:  Admitted to ICU postoperatively, vented  Objective   Blood pressure (!) 129/96, pulse (!) 154, temperature 98.2 F (36.8 C), temperature source Oral, resp. rate 17, height 5' 10 (1.778 m), weight 73.1 kg, SpO2 92%. CVP:  [9 mmHg-16 mmHg] 9 mmHg  Vent Mode: SIMV;PSV;PRVC FiO2 (%):  [40 %-50 %] 40 % Set Rate:  [4 bmp-16 bmp] 4 bmp Vt Set:  [580 mL] 580 mL PEEP:  [5 cmH20] 5 cmH20 Pressure Support:  [10 cmH20] 10 cmH20 Plateau Pressure:  [17 cmH20] 17 cmH20   Intake/Output Summary (Last 24 hours) at 09/05/2024 1058 Last data filed at 09/05/2024 1021 Gross per 24 hour  Intake 4678.53 ml  Output 1270 ml  Net 3408.53 ml   Filed Weights   09/04/24 0500 09/05/24 0500 09/05/24 0654  Weight: 72.5 kg 73.1 kg 73.1 kg    Examination: General: Older male, critically ill-appearing, laying in bed, no distress HENT: Anicteric sclera, PERRLA, ET tube Lungs: CTAB, vented, synchronous Cardiovascular: Irregularly irregular rhythm with moderate burden of PVCs, extremities warm, trace edema in the lower extremities, Impella 5.5 Abdomen: Flat, soft Extremities: Warm Neuro: Sedated, but wakes to voice GU: Foley  Resolved Hospital Problem list    Assessment &  Plan:  Acute on chronic biventricular heart failure  Cardiogenic shock s/p Impella 5.5 placement  Permanent afib  Severe MR  VT/NSVT  CAD  LVAD work up  Had sepsis work up with blood cultures, UA (negative), resp pathogen panel negative. Think less likely septic component to shock state.  - AHF and TCTS following, appreciate management  - Impella 5.5 placed 12/9 -  currently on P-7, 4.5L flow  - con't milrinone  0.25 - trend CVP, LDH, coox, lactate  - holding diuretics for now, CVP 6 - con't amio gtt for afib - rates are controlled for now  - will double check with AHF if they want Eliquis  or heparin  gtt with procedures etc for anticoagulation  - keep K>4, mag >2 for arrhythmias  - optimize for LVAD placement   AKI on CKD IIIa  hyponatremia sCr 1.99>2.6  - no current diuresis  - trend bmp, mag, phos > add on mag phos to labs from earlier  - replete elytes - k repletion ordered  - strict I&O - Avoid nephrotoxic agents, renally dose medications - ensure adequate renal perfusion   Post-operative vent management  Intubated for impella 5.5 placement  - rapid wean protocol  - full mechanical vent support in the interim  - lung protective ventilation 6-8cc/kg Vt - VAP and PAD bundle in place  - titrate FiO2 to sat goal >92  - maintain peak/plats <30, driving pressures <84    Hyponatremia  Likely combo volume overload and BNP diuresis  - monitor for now - if precipitously drops can use tolvaptan or HTS bolus   History of hypertension  Hyperlipidemia  - pravastatin  40mg  daily  - no antihypertensives with decompensated heart failure at present  - GDMT when appropriate   History of hypothyroidism  Most recent TSH 1.87, T4 1.66, T3 2.3  - con't home synthroid  daily   Strict glucose control with work up - ssi and cbg ordered  SCDs ordered  NPO but will order diet when appropriate - currently weaning to extubate  PPI BID started with critical illness  Bowel regimen ordered   Labs   CBC: Recent Labs  Lab 08/31/24 0454 09/01/24 0531 09/01/24 1207 09/02/24 0643 09/03/24 0539 09/04/24 0513 09/05/24 0811 09/05/24 0815  WBC 10.2 10.4  --  10.2 13.5* 14.3*  --   --   NEUTROABS 7.1 6.5  --  6.7 10.3* 10.5*  --   --   HGB 14.3 13.7   < > 13.6 13.0 13.7 11.6* 12.2*  HCT 39.8 38.2*   < > 37.9* 36.8* 38.0* 34.0* 36.0*  MCV 91.7 91.0   --  90.7 91.1 90.9  --   --   PLT 111* 134*  --  151 148* 170  --   --    < > = values in this interval not displayed.    Basic Metabolic Panel: Recent Labs  Lab 08/31/24 0454 09/01/24 0531 09/01/24 1207 09/02/24 0643 09/03/24 0539 09/04/24 0513 09/05/24 0811 09/05/24 0815  NA 133* 131*   < > 131* 133* 133* 129* 131*  K 3.1* 3.3*   < > 3.4* 3.8 4.5 3.4* 3.4*  CL 92* 88*  --  91* 95* 95*  --  90*  CO2 33* 29  --  28 27 25   --   --   GLUCOSE 127* 204*  --  164* 118* 114*  --  175*  BUN 50* 50*  --  51* 44* 46*  --  48*  CREATININE  1.88* 2.03*  --  2.05* 1.83* 1.99*  --  2.60*  CALCIUM 8.4* 8.2*  --  8.3* 9.0 9.4  --   --   MG 2.4 2.1  --  2.1 2.1 2.0  --   --    < > = values in this interval not displayed.   GFR: Estimated Creatinine Clearance: 24.2 mL/min (A) (by C-G formula based on SCr of 2.6 mg/dL (H)). Recent Labs  Lab 08/29/24 1515 08/29/24 1826 08/30/24 0648 09/01/24 0531 09/02/24 0643 09/03/24 0539 09/04/24 0513  WBC  --   --    < > 10.4 10.2 13.5* 14.3*  LATICACIDVEN 1.2 1.3  --   --   --   --   --    < > = values in this interval not displayed.    Liver Function Tests: Recent Labs  Lab 08/31/24 0454 09/01/24 0531 09/02/24 0643 09/03/24 0539 09/04/24 0513  AST 23 18 18 16 17   ALT 18 16 17 16 16   ALKPHOS 51 54 54 50 58  BILITOT 1.5* 1.0 1.5* 1.4* 1.7*  PROT 6.7 6.5 6.9 6.6 7.1  ALBUMIN 3.7 3.7 4.0 3.7 3.7   No results for input(s): LIPASE, AMYLASE in the last 168 hours. No results for input(s): AMMONIA in the last 168 hours.  ABG    Component Value Date/Time   PHART 7.420 09/05/2024 0811   PCO2ART 37.5 09/05/2024 0811   PO2ART 251 (H) 09/05/2024 0811   HCO3 24.3 09/05/2024 0811   TCO2 22 09/05/2024 0815   ACIDBASEDEF 1.0 08/31/2023 1020   O2SAT 100 09/05/2024 0811     Coagulation Profile: No results for input(s): INR, PROTIME in the last 168 hours.  Cardiac Enzymes: No results for input(s): CKTOTAL, CKMB, CKMBINDEX,  TROPONINI in the last 168 hours.  HbA1C: Hgb A1c MFr Bld  Date/Time Value Ref Range Status  09/01/2024 05:00 AM 5.7 (H) 4.8 - 5.6 % Final    Comment:    (NOTE) Diagnosis of Diabetes The following HbA1c ranges recommended by the American Diabetes Association (ADA) may be used as an aid in the diagnosis of diabetes mellitus.  Hemoglobin             Suggested A1C NGSP%              Diagnosis  <5.7                   Non Diabetic  5.7-6.4                Pre-Diabetic  >6.4                   Diabetic  <7.0                   Glycemic control for                       adults with diabetes.    12/30/2023 08:22 AM 5.9 4.6 - 6.5 % Final    Comment:    Glycemic Control Guidelines for People with Diabetes:Non Diabetic:  <6%Goal of Therapy: <7%Additional Action Suggested:  >8%     CBG: No results for input(s): GLUCAP in the last 168 hours.  Review of Systems:   As above   Past Medical History:  He,  has a past medical history of Atrial fibrillation (HCC), CHF (congestive heart failure) (HCC), Cirrhosis (HCC), ED (erectile dysfunction), Hyperglycemia, Hyperlipidemia, Hypertension, Internal hemorrhoids, Myocardial infarction (HCC) (2008),  SCCA (squamous cell carcinoma) of skin, Skin cancer of face, and Thyroid  disease.   Surgical History:   Past Surgical History:  Procedure Laterality Date   A-Fib Cardiomyopathy [facility])  10/2006   EF 25 - 35%   DOPPLER ECHOCARDIOGRAPHY  10/31/2006   EF 25-35% Mod. M.R., mild -mod TR   RIGHT HEART CATH N/A 09/01/2024   Procedure: RIGHT HEART CATH;  Surgeon: Cherrie Toribio SAUNDERS, MD;  Location: [facility] INVASIVE CV LAB;  Service: Cardiovascular;  Laterality: N/A;   RIGHT/LEFT HEART CATH AND CORONARY ANGIOGRAPHY Bilateral 08/31/2023   Procedure: RIGHT/LEFT HEART CATH AND CORONARY ANGIOGRAPHY;  Surgeon: Mady Bruckner, MD;  Location: [facility] INVASIVE CV LAB;  Service: Cardiovascular;  Laterality: Bilateral;     Social History:   reports that he quit  smoking about 58 years ago. His smoking use included cigarettes. He has a 0.3 pack-year smoking history. He has never used smokeless tobacco. He reports that he does not currently use alcohol. He reports that he does not use drugs.   Family History:  His family history includes Cancer in his mother; Colon cancer in his mother; Colon polyps in his mother; Heart disease in his father; Heart disease (age of onset: 54) in his brother; Hyperlipidemia in his father; Hypertension in his mother. There is no history of Prostate cancer.   Allergies No Known Allergies   Home Medications  Prior to Admission medications   Medication Sig Start Date End Date Taking? Authorizing Provider  albuterol  (VENTOLIN  HFA) 108 (90 Base) MCG/ACT inhaler Inhale 2 puffs into the lungs every 4 (four) hours as needed for wheezing or shortness of breath. 08/27/24  Yes Graham, Laura E, PA-C  amoxicillin -clavulanate (AUGMENTIN ) 875-125 MG tablet Take 1 tablet by mouth 2 (two) times daily.   Yes [provider]  apixaban  (ELIQUIS ) 5 MG TABS tablet Take 1 tablet by mouth twice daily 08/16/24  Yes End, Bruckner, MD  carvedilol  (COREG ) 25 MG tablet TAKE 1 TABLET BY MOUTH TWICE DAILY WITH A MEAL 06/12/24  Yes End, Bruckner, MD  digoxin  (LANOXIN ) 0.125 MG tablet Take 0.125 mg by mouth daily.   Yes [provider]  doxazosin  (CARDURA ) 4 MG tablet Take 1 tablet (4 mg total) by mouth at bedtime. 01/06/24  Yes Cleatus Arlyss RAMAN, MD  empagliflozin  (JARDIANCE ) 10 MG TABS tablet Take 1 tablet (10 mg total) by mouth daily before breakfast. 10/04/23  Yes End, Bruckner, MD  ENTRESTO  24-26 MG Take 1 tablet by mouth 2 (two) times daily. 12/12/18  Yes Cleatus Arlyss RAMAN, MD  fish oil-omega-3 fatty acids 1000 MG capsule Take 1 g by mouth daily.   Yes [provider]  fluticasone -salmeterol (ADVAIR) 250-50 MCG/ACT AEPB INHALE 1 DOSE BY MOUTH IN THE MORNING AND AT BEDTIME RINSE MOUTH AFTER USE 08/20/24  Yes Cleatus Arlyss RAMAN, MD  furosemide  (LASIX ) 20 MG tablet Take 1-2 tablets (20-40 mg total) by mouth daily. Patient taking differently: Take 40 mg by mouth 3 (three) times a week. 01/06/24  Yes Cleatus Arlyss RAMAN, MD  furosemide  (LASIX ) 40 MG tablet Take 40 mg by mouth daily. 05/08/24  Yes [provider]  levothyroxine  (SYNTHROID ) 137 MCG tablet Take 1 tablet (137 mcg total) by mouth daily before breakfast. 01/06/24  Yes Cleatus Arlyss RAMAN, MD  Melatonin 5 MG CAPS Take 5 mg by mouth daily.  12/18/14  Yes [provider]  Multiple Vitamin (MULTIVITAMIN) tablet Take 1 tablet by mouth daily.   Yes [provider]  pravastatin  (PRAVACHOL ) 40 MG  tablet Take 1 tablet (40 mg total) by mouth daily. Patient taking differently: Take 40 mg by mouth at bedtime. 01/06/24  Yes Cleatus Arlyss RAMAN, MD  sildenafil  (VIAGRA ) 100 MG tablet Take 1 tablet (100 mg total) by mouth daily as needed for erectile dysfunction. 03/01/24  Yes Cleatus Arlyss RAMAN, MD  spironolactone  (ALDACTONE ) 25 MG tablet Take 1 tablet (25 mg total) by mouth daily. 01/06/24  Yes Cleatus Arlyss RAMAN, MD     Critical care time: 23   The patient is critically ill with multiple organ system failure and requires high complexity decision making for assessment and support, frequent evaluation and titration of therapies, advanced monitoring, review of radiographic studies and interpretation of complex data.    Critical Care Time devoted to patient care services, exclusive of separately billable procedures, described in this note is 45   Tinnie FORBES Adolph DEVONNA [facility] 09/05/24 11:35 AM  Please see [email or web address] for pager details.  From 7A-7P if no response, please call [phone] After hours, please call ELink [phone]

## 2024-09-05 NOTE — Anesthesia Postprocedure Evaluation (Signed)
 Anesthesia Post Note  Patient: [name] [name]  Procedure(s) Performed: INSERTION, CARDIAC ASSIST DEVICE, IMPELLA ECHOCARDIOGRAM, TRANSESOPHAGEAL     Patient location during evaluation: ICU Anesthesia Type: General Level of consciousness: awake Pain management: pain level controlled Vital Signs Assessment: post-procedure vital signs reviewed and stable Respiratory status: spontaneous breathing, nonlabored ventilation and respiratory function stable Cardiovascular status: blood pressure returned to baseline and stable Postop Assessment: no apparent nausea or vomiting Anesthetic complications: no   No notable events documented.  Last Vitals:  Vitals:   09/05/24 1700 09/05/24 1715  BP:    Pulse: 64 62  Resp: 15 16  Temp: 36.7 C 36.8 C  SpO2: 93% 96%    Last Pain:  Vitals:   09/05/24 1719  TempSrc:   PainSc: Asleep                 Celenia Hruska P Xzayvion Vaeth

## 2024-09-05 NOTE — Anesthesia Procedure Notes (Signed)
 Procedure Name: Intubation Date/Time: 09/05/2024 7:40 AM  Performed by: Vertie Arthea RAMAN, CRNAPre-anesthesia Checklist: Patient identified, Emergency Drugs available, Suction available and Patient being monitored Patient Re-evaluated:Patient Re-evaluated prior to induction Oxygen Delivery Method: Circle System Utilized Preoxygenation: Pre-oxygenation with 100% oxygen Induction Type: IV induction Ventilation: Mask ventilation without difficulty Laryngoscope Size: Miller and 2 Grade View: Grade II Tube type: Oral Tube size: 7.5 mm Number of attempts: 1 Airway Equipment and Method: Stylet and Oral airway Placement Confirmation: ETT inserted through vocal cords under direct vision, positive ETCO2 and breath sounds checked- equal and bilateral Tube secured with: Tape Dental Injury: Teeth and Oropharynx as per pre-operative assessment

## 2024-09-05 NOTE — Procedures (Signed)
 Extubation Procedure Note  Patient Details:   Name: [name] [name] DOB: 04/19/1946 MRN: [id number]   Airway Documentation:    Vent end date: 09/05/24 Vent end time: 1320   Evaluation  O2 sats: stable throughout Complications: No apparent complications Patient did tolerate procedure well. Bilateral Breath Sounds: Clear, Diminished   Yes  Positive cuff leak noted. NIF - 26, VC 732. Patient placed on [place] 4L with humidity, no stridor noted. Patient able to reach 750 using the incentive spirometer.  Cy RAMAN Nahiem Dredge 09/05/2024, 1:30 PM

## 2024-09-05 NOTE — Anesthesia Procedure Notes (Signed)
 Central Venous Catheter Insertion Performed by: Patrisha Bernardino SQUIBB, MD, anesthesiologist Start/End12/05/2024 7:45 AM, 09/05/2024 8:00 AM Patient location: OR. Preanesthetic checklist: patient identified, IV checked, site marked, risks and benefits discussed, surgical consent, monitors and equipment checked, pre-op evaluation, timeout performed and anesthesia consent Position: Trendelenburg Patient sedated Hand hygiene performed , maximum sterile barriers used  and Seldinger technique used Catheter size: 9 Fr Total catheter length 12. MAC introducer Procedure performed using ultrasound to evaluate access site. Ultrasound Notes:relevant anatomy identified, ultrasound used to visualize needle entry, vessel patent under ultrasound and image(s) printed for medical record. Attempts: 1 Following insertion, line sutured and dressing applied. Post procedure assessment: blood return through all ports, free fluid flow and no air  Patient tolerated the procedure well with no immediate complications.

## 2024-09-05 NOTE — Progress Notes (Signed)
 EVENING ROUNDS NOTE :     [address] [place] [address]             [phone]               * Day of Surgery * Procedure(s) (LRB): INSERTION, CARDIAC ASSIST DEVICE, IMPELLA (N/A) ECHOCARDIOGRAM, TRANSESOPHAGEAL (N/A)   Total Length of Stay:  LOS: 8 days  Events:   Extubated Good flows    BP 109/71   Pulse 67   Temp (!) 97.2 F (36.2 C)   Resp 14   Ht 5' 10 (1.778 m)   Wt 73.1 kg   SpO2 95%   BMI 23.12 kg/m   CVP:  [7 mmHg-13 mmHg] 8 mmHg  Vent Mode: PSV;CPAP FiO2 (%):  [40 %-50 %] 40 % Set Rate:  [4 bmp-16 bmp] 4 bmp Vt Set:  [580 mL] 580 mL PEEP:  [5 cmH20] 5 cmH20 Pressure Support:  [10 cmH20] 10 cmH20 Plateau Pressure:  [12 cmH20-17 cmH20] 12 cmH20   amiodarone  60 mg/hr (09/05/24 1500)   dexmedetomidine  (PRECEDEX ) IV infusion Stopped (09/05/24 1210)   magnesium  sulfate bolus IVPB 2 g (09/05/24 1534)   milrinone  0.25 mcg/kg/min (09/05/24 1500)   potassium chloride  10 mEq (09/05/24 1526)   sodium bicarbonate  25 mEq (Impella PURGE) in dextrose  5 % 1000 mL bag      I/O last 3 completed shifts: In: 4600.5 [P.O.:1670; I.V.:2930.5] Out: 1545 [Urine:1545]      Latest Ref Rng & Units 09/05/2024    3:43 PM 09/05/2024    1:08 PM 09/05/2024   10:55 AM  CBC  Hemoglobin 13.0 - 17.0 g/dL 89.0  88.3  89.0   Hematocrit 39.0 - 52.0 % 32.0  34.0  32.0        Latest Ref Rng & Units 09/05/2024    3:43 PM 09/05/2024    1:08 PM 09/05/2024   10:55 AM  BMP  Sodium 135 - 145 mmol/L 129  130  129   Potassium 3.5 - 5.1 mmol/L 4.0  3.6  3.5     ABG    Component Value Date/Time   PHART 7.432 09/05/2024 1543   PCO2ART 40.1 09/05/2024 1543   PO2ART 64 (L) 09/05/2024 1543   HCO3 26.9 09/05/2024 1543   TCO2 28 09/05/2024 1543   ACIDBASEDEF 1.0 08/31/2023 1020   O2SAT 93 09/05/2024 1543       Linnie Rayas, MD 09/05/2024 3:58 PM

## 2024-09-05 NOTE — Progress Notes (Signed)
 RT assessed patient and patient stated he does not want to be placed on Bipap. On Stand by

## 2024-09-05 NOTE — Brief Op Note (Signed)
 08/27/2024 - 09/05/2024  9:49 AM  PATIENT:  [name] [name]  78 y.o. male  PRE-OPERATIVE DIAGNOSIS:  Congestive heart failure  POST-OPERATIVE DIAGNOSIS:  Congestive heart failure  PROCEDURE:  Procedure(s) with comments: INSERTION, CARDIAC ASSIST DEVICE, IMPELLA (N/A) - Impella 5.5 insertion ECHOCARDIOGRAM, TRANSESOPHAGEAL (N/A)  SURGEON:  Surgeons and Role:    * Kerrin Elspeth BROCKS, MD - Primary  PHYSICIAN ASSISTANT:   ASSISTANTS: Con Bend, PA   ANESTHESIA:   general  EBL: minimal  BLOOD ADMINISTERED:none  DRAINS: Impella   LOCAL MEDICATIONS USED:  NONE  SPECIMEN:  No Specimen  DISPOSITION OF SPECIMEN:  N/A  COUNTS:  YES  TOURNIQUET:  * No tourniquets in log *  DICTATION: .Other Dictation: Dictation Number -  PLAN OF CARE: ICU  PATIENT DISPOSITION:  ICU - extubated and stable.   Delay start of Pharmacological VTE agent (>24hrs) due to surgical blood loss or risk of bleeding: not applicable

## 2024-09-05 NOTE — Progress Notes (Signed)
 Advanced Heart Failure Rounding Note  Cardiologist: [name], MD  AHF: Dr. Zenaida  Chief Complaint: A/c Systolic HF Patient Profile   78 y/o male w/ chronic systolic heart failure w/ biventricular dysfunction d/t mixed ischemic/nonischemic CM, CAD, severe functional MR and permanent atrial fibrillation admitted w/ a/c CHF w/ NYHA Class IV symptoms. Now w/ low output. Initial Co-ox 47%. Started on Milrinone .   Subjective:   S/P Impella 5.5 Placement   On arrival from OR, intubated, on milrinone  0.25 mcg + amio 60 mg per    Impella @ P7 Flow 4.7     Objective:    Weight Range: 73.1 kg Body mass index is 23.12 kg/m.   Vital Signs:   Temp:  [97.8 F (36.6 C)-98.5 F (36.9 C)] 98.2 F (36.8 C) (12/09 0654) Pulse Rate:  [80-154] 154 (12/09 0654) Resp:  [16-20] 17 (12/09 0340) BP: (114-129)/(60-96) 129/96 (12/09 0654) SpO2:  [92 %-99 %] 92 % (12/09 0654) Weight:  [73.1 kg] 73.1 kg (12/09 0654) Last BM Date : 09/02/24  Weight change: Filed Weights   09/04/24 0500 09/05/24 0500 09/05/24 0654  Weight: 72.5 kg 73.1 kg 73.1 kg   Intake/Output:  Intake/Output Summary (Last 24 hours) at 09/05/2024 1022 Last data filed at 09/05/2024 0927 Gross per 24 hour  Intake 4378.53 ml  Output 1270 ml  Net 3108.53 ml    Physical Exam  General:  Intubated Neck: difficult to assess Cor: Irregular rate & rhythm. Impella 5.5  Lungs: clear Abdomen: soft, nontender, nondistended.  Extremities: no  edema Neuro:Intubated.   Telemetry  A fib 100s   Labs    CBC Recent Labs    09/03/24 0539 09/04/24 0513 09/05/24 0811 09/05/24 0815  WBC 13.5* 14.3*  --   --   NEUTROABS 10.3* 10.5*  --   --   HGB 13.0 13.7 11.6* 12.2*  HCT 36.8* 38.0* 34.0* 36.0*  MCV 91.1 90.9  --   --   PLT 148* 170  --   --    Basic Metabolic Panel Recent Labs    87/92/74 0539 09/04/24 0513 09/05/24 0811 09/05/24 0815  NA 133* 133* 129* 131*  K 3.8 4.5 3.4* 3.4*  CL 95* 95*  --  90*  CO2  27 25  --   --   GLUCOSE 118* 114*  --  175*  BUN 44* 46*  --  48*  CREATININE 1.83* 1.99*  --  2.60*  CALCIUM 9.0 9.4  --   --   MG 2.1 2.0  --   --    Liver Function Tests Recent Labs    09/03/24 0539 09/04/24 0513  AST 16 17  ALT 16 16  ALKPHOS 50 58  BILITOT 1.4* 1.7*  PROT 6.6 7.1  ALBUMIN 3.7 3.7   BNP (last 3 results) Recent Labs    08/27/24 1907 08/28/24 0204  BNP 1,633.9* 1,952.5*   Medications:    Scheduled Medications:    Infusions:  amiodarone  60 mg/hr (09/05/24 0723)   milrinone      sodium bicarbonate  25 mEq (Impella PURGE) in dextrose  5 % 1000 mL bag      PRN Medications: sodium chloride , acetaminophen  **OR** acetaminophen  (TYLENOL ) oral liquid 160 mg/5 mL **OR** acetaminophen , fentaNYL  (SUBLIMAZE ) injection, midazolam  PF  Assessment/Plan   1. Acute on Chronic Systolic Heart Failure w/ Low Output  - mixed ischemic/idiopathic CM  - Echo 2/25: LVEF 25 to 30%, moderate to severely dilated, mildly reduced RV function - Echo 12/1 EF <20%,  RV not well visualized  - RHC 12/5 (on milrinone  0.25): Well compensated. mildly elevated L sided filling and PA pressures with mildly reduced CO by TD 4.3/2.3 - Initial co-ox 47%. Started on milrinone  0.25. CO-OX remained suboptimal. Escalation of care today --> MCS Impella 5.5 placed.  - Taken to OR for Impella 5.5 . On arrival Impella at P7 Flow 4.7. Check LDH and check Echo for positioning.   CVP low. Hold diuretics.  - off digoxin  given elevated level and AKI. - other GDMT limited by renal function - ACC/AHA stage D. Discuss at [facility] tomorrow.   2. Permanent Afib  - V-rates 90s-120s, poorly controlled with exertion - Continue amio to 60/hr with VR  - continue Eliquis     3. Severe MR - functional  - LV cavity more dilated on this admission echo in setting of a/c CHF exacerbation, LVIDs 7.7 cm (6.40 cm) - HF optimization per above   4. VT/NSVT - in setting of worsening HF, 17 beat run tele 12/2 - increased  frequency of NSVT runs  - keep K > 4.0 and Mg > 2.0 - Continue IV amio  - he previously declined ICD, minimal role at this time for ICD   5. CAD - LHC 08/31/2023: LMCA with 20% distal stenosis. LAD with sequential 50% proximal and mid lesions. LCx with 30% mid stenosis, and proximal/mid RCA with 30% disease and 70% stenosis at the ostium of AM1  -  continue statin - no ASA given need for Eliquis   6. AKI - b/l SCr 1.4 - bumped to 2.6 today -Continue to follow closely. Hopefully will improve with addition of MCS. .  7. Hypokalemia - Supp K   8. H/o Malignant Melanoma - occipital scalp, s/p excision @ [facility] in 2023 - never followed up for surveillance head CTs  - CT head reassuring  CCM consulted today.  Length of Stay: 8  Redmond Whittley, NP  09/05/2024, 10:22 AM  Advanced Heart Failure Team Pager [phone] (M-F; 7a - 5p)  Please contact [facility] for night-coverage after hours (5p -7a ) and weekends on [email or web address]

## 2024-09-05 NOTE — Anesthesia Preprocedure Evaluation (Addendum)
 Anesthesia Evaluation  Patient identified by MRN, date of birth, ID band Patient awake    Reviewed: Allergy & Precautions, NPO status , Patient's Chart, lab work & pertinent test results  Airway Mallampati: II  TM Distance: >3 FB Neck ROM: Full    Dental no notable dental hx.    Pulmonary former smoker   Pulmonary exam normal        Cardiovascular hypertension, Pt. on home beta blockers + CAD, + Past MI and +CHF  Normal cardiovascular exam+ dysrhythmias Atrial Fibrillation   ECHO: 1. Left ventricular ejection fraction, by estimation, is <20%. The left  ventricle has severely decreased function. The left ventricle demonstrates  global hypokinesis. The left ventricular internal cavity size was severely  dilated. Left ventricular  diastolic parameters are indeterminate.   2. Right ventricular systolic function was not well visualized. The right  ventricular size is not well visualized.   3. Left atrial size was severely dilated.   4. Right atrial size was severely dilated.   5. A small pericardial effusion is present.   6. The mitral valve is abnormal. Not well visualized mitral valve  regurgitation.   7. Tricuspid valve regurgitation not well visualized.   8. The aortic valve was not well visualized. There is mild calcification  of the aortic valve. Aortic valve regurgitation is mild.     Neuro/Psych negative neurological ROS  negative psych ROS   GI/Hepatic negative GI ROS, Neg liver ROS,,,  Endo/Other  Hypothyroidism    Renal/GU Renal disease     Musculoskeletal negative musculoskeletal ROS (+)    Abdominal   Peds  Hematology  (+) Blood dyscrasia (Eliquis ), anemia   Anesthesia Other Findings End stage heart failure  Reproductive/Obstetrics                              Anesthesia Physical Anesthesia Plan  ASA: 4  Anesthesia Plan: General   Post-op Pain Management:     Induction:   PONV Risk Score and Plan: 2 and Ondansetron , Dexamethasone , Midazolam  and Treatment may vary due to age or medical condition  Airway Management Planned: Oral ETT  Additional Equipment: TEE, Arterial line, Ultrasound Guidance Line Placement and CVP  Intra-op Plan:   Post-operative Plan: Post-operative intubation/ventilation  Informed Consent: I have reviewed the patients History and Physical, chart, labs and discussed the procedure including the risks, benefits and alternatives for the proposed anesthesia with the patient or authorized representative who has indicated his/her understanding and acceptance.     Dental advisory given  Plan Discussed with: CRNA and Surgeon  Anesthesia Plan Comments: (TEE for intraoperative monitoring only)         Anesthesia Quick Evaluation

## 2024-09-05 NOTE — Transfer of Care (Signed)
 Immediate Anesthesia Transfer of Care Note  Patient: [name] [name]  Procedure(s) Performed: INSERTION, CARDIAC ASSIST DEVICE, IMPELLA ECHOCARDIOGRAM, TRANSESOPHAGEAL  Patient Location: ICU  Anesthesia Type:General  Level of Consciousness: sedated and unresponsive  Airway & Oxygen Therapy: Patient remains intubated per anesthesia plan and Patient placed on Ventilator (see vital sign flow sheet for setting)  Post-op Assessment: Report given to RN and Post -op Vital signs reviewed and stable  Post vital signs: Reviewed and stable  Last Vitals:  Vitals Value Taken Time  BP    Temp    Pulse 66 09/05/24 10:28  Resp 16 09/05/24 10:29  SpO2 98 % 09/05/24 10:28  Vitals shown include unfiled device data.  Last Pain:  Vitals:   09/05/24 0654  TempSrc: Oral  PainSc:       Patients Stated Pain Goal: 0 (09/03/24 1018)  Complications: No notable events documented.

## 2024-09-05 NOTE — Anesthesia Procedure Notes (Signed)
 Arterial Line Insertion Start/End12/05/2024 7:31 AM, 09/05/2024 7:31 AM Performed by: Patrisha Bernardino SQUIBB, MD, Holtzman, Ariel Laren, CRNA, CRNA  Patient location: Pre-op. Preanesthetic checklist: patient identified, IV checked, site marked, risks and benefits discussed, surgical consent, monitors and equipment checked, pre-op evaluation, timeout performed and anesthesia consent Left, radial was placed Catheter size: 20 G Hand hygiene performed  and maximum sterile barriers used  Allen's test indicative of satisfactory collateral circulation Attempts: 1 Following insertion, dressing applied and Biopatch. Post procedure assessment: normal and unchanged  Patient tolerated the procedure well with no immediate complications.

## 2024-09-05 NOTE — Op Note (Unsigned)
 NAME: [name] [name], [name] [name] MEDICAL RECORD NO: [id number] ACCOUNT NO: [id number] DATE OF BIRTH: December 25, 1945 FACILITY: [facility] LOCATION: [facility] PHYSICIAN: Elspeth BROCKS. Kerrin, MD  Operative Report   DATE OF PROCEDURE: 09/05/2024  PREOPERATIVE DIAGNOSIS:  Decompensated heart failure.  POSTOPERATIVE DIAGNOSIS:  Decompensated heart failure.  PROCEDURE:  Impella 5.5 placement via graft to right axillary artery.  SURGEON:  Elspeth BROCKS. Kerrin, MD.  ASSISTANT:  Con Bend, PA.  Experienced assistance is necessary for this case due to surgical complexity.  Con Bend assisted with exposure, retraction of delicate tissues, suture management, and suctioning.  ANESTHESIA:  General.  FINDINGS:  Severe left ventricular dysfunction.  Good position of Impella post-implantation.  CLINICAL NOTE:  The patient is a 78 year old gentleman with severe left ventricular dysfunction who has developed decompensated heart failure with increasing pressor requirements and worsening end-organ function.  He was offered temporary mechanical  support as a bridge to durable mechanical support in the near future.  The indications, risks, benefits, and alternatives were discussed in detail with the patient.  He understood and accepted the risks and agreed to proceed.  OPERATIVE NOTE:  The patient was brought to the operating room on 09/05/2024.  In the preoperative holding area, he was noted to be in atrial fibrillation with a rate in the 140s.  His co-ox was 50% on his morning labs.  In the operating room, he was  anesthetized and intubated, a Foley catheter was placed, and sequential compression devices were placed on the calves for DVT prophylaxis.  Intravenous antibiotics were administered.  Dr. Bernardino Mango performed transesophageal echocardiography.  Please  see his separately dictated note for full details of that procedure.  The chest, abdomen, and groins were prepped and draped in the usual sterile  fashion.  A time-out was performed.  An incision was made in the right deltopectoral groove.  The dissection was carried down.  The brachial plexus was overlying the axillary artery.  It was carefully dissected off, not placing any traction on the nerve.  Proximal  and distal control was obtained on the axillary artery.  The patient was given 5,000 units of heparin .  Clamps were placed proximally and distally, and an arteriotomy was made.  A 10-mm Hemashield graft was sewn end-to-side to the axillary artery with a  running 5-0 Prolene suture.  The distal clamp was removed first to de-air the graft and then the proximal clamp was removed as well.  ACT measurement showed an ACT of 203.  Additional 4,000 units of heparin  was administered.  Repeat ACT was greater than  250.  The peel-away sheath introducer was placed into the graft.  The flexible wire was advanced into the left ventricle and passed without difficulty.  Fluoroscopy was used with a total fluoroscopy time of 3 minutes during the entire procedure.  The  pigtail catheter was advanced over the wire, the wire was then removed, and the Impella wire was placed.  There was good positioning of the wire.  The Impella device then was passed over the wire, advanced through the graft, and into the left ventricle  without difficulty.  Positioning was confirmed with echocardiography.  Flow was initiated at P2 and gradually increased to P6 with 4.4 liters of flow.  The graft was cut to length, the sheath was advanced into the graft, and secured with multiple heavy  silk sutures.  After confirming correct positioning of the device, the device was secured within the sheath.  The wound was inspected for hemostasis, it was irrigated  with saline, and it was then closed in 3 layers.  The Impella sheath was secured to the  skin on the chest wall with 0 silk sutures.  All sponge, needle, and instrument counts were correct at the end of the procedure.  The patient was  transported from the operating room to the surgical intensive care unit in fair condition.   NIK D: 09/05/2024 5:07:20 pm T: 09/05/2024 7:28:00 pm  JOB: [id number]/ [id number]

## 2024-09-05 NOTE — Interval H&P Note (Signed)
 History and Physical Interval Note:   In A fib with rate in 140s this AM, co-ox 50 Will proceed with Impella, DCCV after asleep  09/05/2024 7:11 AM  [name] [name] Mania  has presented today for surgery, with the diagnosis of CHF.  The various methods of treatment have been discussed with the patient and family. After consideration of risks, benefits and other options for treatment, the patient has consented to  Procedure(s) with comments: INSERTION, CARDIAC ASSIST DEVICE, IMPELLA (N/A) - Impella 5.5 insertion ECHOCARDIOGRAM, TRANSESOPHAGEAL (N/A) as a surgical intervention.  The patient's history has been reviewed, patient examined, no change in status, stable for surgery.  I have reviewed the patient's chart and labs.  Questions were answered to the patient's satisfaction.     [name]

## 2024-09-06 ENCOUNTER — Inpatient Hospital Stay (HOSPITAL_COMMUNITY)

## 2024-09-06 ENCOUNTER — Encounter (HOSPITAL_COMMUNITY): Payer: Self-pay | Admitting: Thoracic Surgery (Cardiothoracic Vascular Surgery)

## 2024-09-06 DIAGNOSIS — J90 Pleural effusion, not elsewhere classified: Secondary | ICD-10-CM | POA: Diagnosis not present

## 2024-09-06 DIAGNOSIS — N179 Acute kidney failure, unspecified: Secondary | ICD-10-CM

## 2024-09-06 DIAGNOSIS — Z515 Encounter for palliative care: Secondary | ICD-10-CM | POA: Diagnosis not present

## 2024-09-06 DIAGNOSIS — I509 Heart failure, unspecified: Secondary | ICD-10-CM | POA: Diagnosis not present

## 2024-09-06 DIAGNOSIS — Z95811 Presence of heart assist device: Secondary | ICD-10-CM

## 2024-09-06 DIAGNOSIS — I5023 Acute on chronic systolic (congestive) heart failure: Secondary | ICD-10-CM | POA: Diagnosis not present

## 2024-09-06 DIAGNOSIS — Z7189 Other specified counseling: Secondary | ICD-10-CM | POA: Diagnosis not present

## 2024-09-06 DIAGNOSIS — R579 Shock, unspecified: Secondary | ICD-10-CM | POA: Diagnosis not present

## 2024-09-06 DIAGNOSIS — J811 Chronic pulmonary edema: Secondary | ICD-10-CM | POA: Diagnosis not present

## 2024-09-06 DIAGNOSIS — R0989 Other specified symptoms and signs involving the circulatory and respiratory systems: Secondary | ICD-10-CM | POA: Diagnosis not present

## 2024-09-06 LAB — CBC WITH DIFFERENTIAL/PLATELET
Abs Immature Granulocytes: 0.17 K/uL — ABNORMAL HIGH (ref 0.00–0.07)
Basophils Absolute: 0.1 K/uL (ref 0.0–0.1)
Basophils Relative: 0 %
Eosinophils Absolute: 0.1 K/uL (ref 0.0–0.5)
Eosinophils Relative: 1 %
HCT: 31.6 % — ABNORMAL LOW (ref 39.0–52.0)
Hemoglobin: 11.5 g/dL — ABNORMAL LOW (ref 13.0–17.0)
Immature Granulocytes: 1 %
Lymphocytes Relative: 6 %
Lymphs Abs: 0.9 K/uL (ref 0.7–4.0)
MCH: 33 pg (ref 26.0–34.0)
MCHC: 36.4 g/dL — ABNORMAL HIGH (ref 30.0–36.0)
MCV: 90.5 fL (ref 80.0–100.0)
Monocytes Absolute: 1.5 K/uL — ABNORMAL HIGH (ref 0.1–1.0)
Monocytes Relative: 10 %
Neutro Abs: 13.1 K/uL — ABNORMAL HIGH (ref 1.7–7.7)
Neutrophils Relative %: 82 %
Platelets: 147 K/uL — ABNORMAL LOW (ref 150–400)
RBC: 3.49 MIL/uL — ABNORMAL LOW (ref 4.22–5.81)
RDW: 12.4 % (ref 11.5–15.5)
WBC: 15.8 K/uL — ABNORMAL HIGH (ref 4.0–10.5)
nRBC: 0 % (ref 0.0–0.2)

## 2024-09-06 LAB — COMPREHENSIVE METABOLIC PANEL WITH GFR
ALT: 12 U/L (ref 0–44)
AST: 13 U/L — ABNORMAL LOW (ref 15–41)
Albumin: 3.2 g/dL — ABNORMAL LOW (ref 3.5–5.0)
Alkaline Phosphatase: 49 U/L (ref 38–126)
Anion gap: 10 (ref 5–15)
BUN: 46 mg/dL — ABNORMAL HIGH (ref 8–23)
CO2: 26 mmol/L (ref 22–32)
Calcium: 8.3 mg/dL — ABNORMAL LOW (ref 8.9–10.3)
Chloride: 92 mmol/L — ABNORMAL LOW (ref 98–111)
Creatinine, Ser: 2.45 mg/dL — ABNORMAL HIGH (ref 0.61–1.24)
GFR, Estimated: 26 mL/min — ABNORMAL LOW (ref >60)
Glucose, Bld: 125 mg/dL — ABNORMAL HIGH (ref 70–99)
Potassium: 3.9 mmol/L (ref 3.5–5.1)
Sodium: 128 mmol/L — ABNORMAL LOW (ref 135–145)
Total Bilirubin: 1.6 mg/dL — ABNORMAL HIGH (ref 0.0–1.2)
Total Protein: 6 g/dL — ABNORMAL LOW (ref 6.5–8.1)

## 2024-09-06 LAB — COOXEMETRY PANEL
Carboxyhemoglobin: 1.3 % (ref 0.5–1.5)
Methemoglobin: 0.7 % (ref 0.0–1.5)
O2 Saturation: 68.2 %
Total hemoglobin: 11.8 g/dL — ABNORMAL LOW (ref 12.0–16.0)

## 2024-09-06 LAB — POCT I-STAT 7, (LYTES, BLD GAS, ICA,H+H)
Acid-Base Excess: 1 mmol/L (ref 0.0–2.0)
Bicarbonate: 25.7 mmol/L (ref 20.0–28.0)
Calcium, Ion: 1.13 mmol/L — ABNORMAL LOW (ref 1.15–1.40)
HCT: 33 % — ABNORMAL LOW (ref 39.0–52.0)
Hemoglobin: 11.2 g/dL — ABNORMAL LOW (ref 13.0–17.0)
O2 Saturation: 97 %
Patient temperature: 37.2
Potassium: 4.1 mmol/L (ref 3.5–5.1)
Sodium: 127 mmol/L — ABNORMAL LOW (ref 135–145)
TCO2: 27 mmol/L (ref 22–32)
pCO2 arterial: 40.2 mmHg (ref 32–48)
pH, Arterial: 7.414 (ref 7.35–7.45)
pO2, Arterial: 93 mmHg (ref 83–108)

## 2024-09-06 LAB — ECHOCARDIOGRAM LIMITED
Est EF: <20
Height: 70 in
Weight: 2645.52 [oz_av]

## 2024-09-06 LAB — HEPARIN LEVEL (UNFRACTIONATED): Heparin Unfractionated: >1.1 [IU]/mL — ABNORMAL HIGH (ref 0.30–0.70)

## 2024-09-06 LAB — GLUCOSE, CAPILLARY
Glucose-Capillary: 127 mg/dL — ABNORMAL HIGH (ref 70–99)
Glucose-Capillary: 153 mg/dL — ABNORMAL HIGH (ref 70–99)
Glucose-Capillary: 126 mg/dL — ABNORMAL HIGH (ref 70–99)

## 2024-09-06 LAB — LACTATE DEHYDROGENASE: LDH: 125 U/L (ref 105–235)

## 2024-09-06 LAB — APTT: aPTT: 52 s — ABNORMAL HIGH (ref 24–36)

## 2024-09-06 MED ORDER — SODIUM CHLORIDE 0.9 % IV SOLN: 2.0000 g | INTRAVENOUS | Status: DC

## 2024-09-06 MED ORDER — MELATONIN 5 MG PO TABS
10.0000 mg | ORAL_TABLET | Freq: Every evening | ORAL | Status: AC | PRN
  Administered 2024-09-06: 10 mg via ORAL
  Filled 2024-09-06: qty 2

## 2024-09-06 MED ORDER — PIPERACILLIN-TAZOBACTAM 3.375 G IVPB
3.3750 g | Freq: Three times a day (TID) | INTRAVENOUS | Status: DC
  Administered 2024-09-06 – 2024-09-11 (×16): 3.375 g via INTRAVENOUS
  Filled 2024-09-06 (×15): qty 50

## 2024-09-06 MED ORDER — SENNOSIDES-DOCUSATE SODIUM 8.6-50 MG PO TABS
2.0000 | ORAL_TABLET | Freq: Every day | ORAL | Status: DC
  Administered 2024-09-06 – 2024-09-08 (×3): 2 via ORAL
  Filled 2024-09-06 (×3): qty 2

## 2024-09-06 MED ORDER — MELATONIN 5 MG PO TABS: 5.0000 mg | ORAL_TABLET | Freq: Every evening | ORAL | Status: DC | PRN

## 2024-09-06 MED ORDER — INSULIN ASPART 100 UNIT/ML IJ SOLN: 0.0000 [IU] | Freq: Three times a day (TID) | INTRAMUSCULAR | Status: DC

## 2024-09-06 MED ORDER — HEPARIN (PORCINE) 25000 UT/250ML-% IV SOLN
1450.0000 [IU]/h | INTRAVENOUS | Status: DC
  Administered 2024-09-06: 600 [IU]/h via INTRAVENOUS
  Administered 2024-09-07: 800 [IU]/h via INTRAVENOUS
  Administered 2024-09-09: 900 [IU]/h via INTRAVENOUS
  Administered 2024-09-10: 1200 [IU]/h via INTRAVENOUS
  Administered 2024-09-11 (×2): 1450 [IU]/h via INTRAVENOUS
  Filled 2024-09-06 (×6): qty 250

## 2024-09-06 NOTE — Progress Notes (Signed)
 PHARMACY - ANTICOAGULATION CONSULT NOTE  Pharmacy Consult for IV heparin  Indication: afib  No Known Allergies  Patient Measurements: Height: 5' 10 (177.8 cm) Weight: 75 kg (165 lb 5.5 oz) IBW/kg (Calculated) : 73 HEPARIN  DW (KG): 73.1  Vital Signs: Temp: 98.8 F (37.1 C) (12/10 1215) Temp Source: Bladder (12/10 1200) BP: 129/96 (12/10 1300) Pulse Rate: 71 (12/10 1300)  Labs: Recent Labs    09/04/24 0513 09/04/24 1802 09/05/24 0811 09/05/24 1036 09/05/24 1055 09/05/24 1543 09/05/24 1637 09/06/24 0430 09/06/24 0431  HGB 13.7  --    < > 11.3*   < > 10.9*  --  11.5* 11.2*  HCT 38.0*  --    < > 30.8*   < > 32.0*  --  31.6* 33.0*  PLT 170  --   --  144*  --   --   --  147*  --   APTT  --  68*  --   --   --   --   --   --   --   HEPARINUNFRC  --  >1.10*  --   --   --   --   --   --   --   CREATININE 1.99*  --    < > 2.34*  --   --  2.39* 2.45*  --    < > = values in this interval not displayed.    Estimated Creatinine Clearance: 25.7 mL/min (A) (by C-G formula based on SCr of 2.45 mg/dL (H)).   Medical History: Past Medical History:  Diagnosis Date   Atrial fibrillation (HCC)    CHF (congestive heart failure) (HCC)    Cirrhosis (HCC)    ED (erectile dysfunction)    Hyperglycemia    Hyperlipidemia    Hypertension    Internal hemorrhoids    Myocardial infarction (HCC) 2008   SCCA (squamous cell carcinoma) of skin    Left ear.  MOHS surgery 2014 [facility]   Skin cancer of face    2012 basal cell   Thyroid  disease     Medications:  Infusions:   amiodarone  60 mg/hr (09/06/24 1325)   heparin  600 Units/hr (09/06/24 1300)   milrinone  0.25 mcg/kg/min (09/06/24 1300)   piperacillin -tazobactam (ZOSYN )  IV 3.375 g (09/06/24 1323)   sodium bicarbonate  25 mEq (Impella PURGE) in dextrose  5 % 1000 mL bag      Assessment: 78 yo male on apixaban  PTA for afib (last dose 12/7).  Switched to heparin  drip briefly, then held for Impella 5.5 placement.  Pharmacy  asked to resume IV heparin  today.  Previously had therapeutic aPTT on 950 units/hr.  Goal of Therapy:  Heparin  level 0.3-0.5 APTT 66-85 Monitor platelets by anticoagulation protocol: Yes   Plan:  Start IV heparin  without bolus at low rate of 600 units/hr.   Check heparin  level and aPTT in 8 hrs. Daily heparin  level, aPTT and CBC. Per discussion with Dr. Rolan, okay for pharmacy to titrate slowly to goal.  Harlene Barlow, Berdine BIRCH, BCPS, [facility] Clinical Pharmacist  09/06/2024 1:37 PM   [facility] pharmacy phone numbers are listed on [email or web address]

## 2024-09-06 NOTE — Progress Notes (Signed)
 Echocardiogram 2D Echocardiogram has been performed.  [name] [name] 09/06/2024, 10:19 AM

## 2024-09-06 NOTE — Progress Notes (Signed)
 Palliative Medicine Progress Note   Patient Name: [name] [name]       Date: 09/06/2024 DOB: [birth year]  Age: 78 y.o. MRN#: [id number] Attending Physician: Zenaida Morene PARAS, MD Primary Care Physician: Cleatus Arlyss RAMAN, MD Admit Date: 08/27/2024   HPI/Patient Profile: 78 y.o. male  with past medical history of chronic systolic heart failure due to mixed ischemic/nonischemic cardiomyopathy, CAD, permanent A-fib, severe MR, alcohol related cirrhosis, hypothyroidism, hypertension, and hyperlipidemia who was admitted on 08/27/2024 with acute on chronic CHF, now with low output.    Palliative Medicine has been consulted for goals of care discussions in the setting of LVAD evaluation.   Subjective: Chart reviewed. Patient underwent Impella placement on 12/9. He is on milrinone  and amiodarone  infusions. Note creatinine 2.45 today.   Update received from RN. I met with [name] and wife/[name] at bedside as a follow-up for palliative needs and emotional support. He reports feeling decent. Was able to sit up in the recliner this morning. Reports he did not sleep well last last and has poor appetite.   [name] reports that since our initial discussion on 12/5, she and [name] were able to speak with a patient (and his wife) who has an LVAD, and they found this was very helpful and encouraging.    Objective:  Physical Exam Vitals reviewed.  Constitutional:      General: He is not in acute distress. Cardiovascular:     Rate and Rhythm: Normal rate. Rhythm irregularly irregular.     Comments: A-fib Pulmonary:     Effort: Pulmonary effort is normal.  Neurological:     Mental Status: He is alert and oriented to person, place, and time.             Palliative Medicine Assessment & Plan    Assessment: Principal Problem:   Acute on chronic systolic heart failure (HCC) Active Problems:   Acquired hypothyroidism   SOB (shortness of breath)   Paroxysmal atrial fibrillation with RVR (HCC)   Hyperkalemia   Lactic acidosis   High anion gap metabolic acidosis   HLD (hyperlipidemia)   On mechanically assisted ventilation (HCC)   AKI (acute kidney injury)    Recommendations/Plan: Full code Full scope care Patient is hopeful for LVAD placement with the goal of improved quality of life Consider mirtazapine 7.5 mg at bedtime  for appetite and sleep (if melatonin is not effective) PMT will continue to follow    Medical Decision Making: Primary decision maker: Patient Patient states he does have advanced directives; I have asked family to bring a copy to the hospital   Thank you for allowing the Palliative Medicine Team to assist in the care of this patient.   Time: 35 minutes   Recardo KATHEE Loll, NP   Please contact Palliative Medicine Team phone at [phone] for questions and concerns.  For individual providers, please see AMION.

## 2024-09-06 NOTE — Progress Notes (Signed)
 PHARMACY - ANTICOAGULATION CONSULT NOTE  Pharmacy Consult for IV heparin  Indication: afib  No Known Allergies  Patient Measurements: Height: 5' 10 (177.8 cm) Weight: 75 kg (165 lb 5.5 oz) IBW/kg (Calculated) : 73 HEPARIN  DW (KG): 73.1  Vital Signs: Temp: 98.4 F (36.9 C) (12/10 1600) Temp Source: Oral (12/10 1600) BP: 129/92 (12/10 1845) Pulse Rate: 87 (12/10 1845)  Labs: Recent Labs    09/04/24 0513 09/04/24 1802 09/05/24 0811 09/05/24 1036 09/05/24 1055 09/05/24 1543 09/05/24 1637 09/06/24 0430 09/06/24 0431 09/06/24 1752  HGB 13.7  --    < > 11.3*   < > 10.9*  --  11.5* 11.2*  --   HCT 38.0*  --    < > 30.8*   < > 32.0*  --  31.6* 33.0*  --   PLT 170  --   --  144*  --   --   --  147*  --   --   APTT  --  68*  --   --   --   --   --   --   --  52*  HEPARINUNFRC  --  >1.10*  --   --   --   --   --   --   --  >1.10*  CREATININE 1.99*  --    < > 2.34*  --   --  2.39* 2.45*  --   --    < > = values in this interval not displayed.    Estimated Creatinine Clearance: 25.7 mL/min (A) (by C-G formula based on SCr of 2.45 mg/dL (H)).   Medical History: Past Medical History:  Diagnosis Date   Atrial fibrillation (HCC)    CHF (congestive heart failure) (HCC)    Cirrhosis (HCC)    ED (erectile dysfunction)    Hyperglycemia    Hyperlipidemia    Hypertension    Internal hemorrhoids    Myocardial infarction (HCC) 2008   SCCA (squamous cell carcinoma) of skin    Left ear.  MOHS surgery 2014 [facility]   Skin cancer of face    2012 basal cell   Thyroid  disease     Medications:  Infusions:   amiodarone  60 mg/hr (09/06/24 1800)   heparin  600 Units/hr (09/06/24 1800)   milrinone  0.25 mcg/kg/min (09/06/24 1800)   piperacillin -tazobactam (ZOSYN )  IV 12.5 mL/hr at 09/06/24 1800   sodium bicarbonate  25 mEq (Impella PURGE) in dextrose  5 % 1000 mL bag      Assessment: 78 yo male on apixaban  PTA for afib (last dose 12/7).  Switched to heparin  drip briefly, then  held for Impella 5.5 placement.  Pharmacy asked to resume IV heparin  today.  Previously had therapeutic aPTT on 950 units/hr.  12/10 PM: Heparin  level supratherapeutic and not correlating so dosing based on aPTT. aPTT subtherapeutic at 52. No bleeding noted, but patient's urine is dark orange tinged, H/H/Plt stable. Increasing rate slightly.  Goal of Therapy:  Heparin  level 0.3-0.5 APTT 66-85 Monitor platelets by anticoagulation protocol: Yes   Plan:  Increase IV heparin  without bolus to rate of 700 units/hr.   Check heparin  level and aPTT with AM labs. Daily heparin  level, aPTT and CBC. Per discussion with Dr. Rolan, okay for pharmacy to titrate slowly to goal.  Larraine Brazier, PharmD Clinical Pharmacist 09/06/2024  6:58 PM **Pharmacist phone directory can now be found on [email or web address] ([email or web address]).  Listed under [facility].

## 2024-09-06 NOTE — Progress Notes (Addendum)
 Patient ID: [name] [name], male   DOB: 1946/03/13, 78 y.o.   MRN: [id number]  Impella position assessed under echo guidance, Impella appeared shallow at 3.5 cm.  Impella advanced, left at 5 cm from aortic valve.  Flow stable 4.4 - 4.5 L/min.   [name] 09/06/2024 10:15 AM

## 2024-09-06 NOTE — Plan of Care (Signed)
°  Problem: Clinical Measurements: Goal: Ability to maintain clinical measurements within normal limits will improve Outcome: Progressing Goal: Respiratory complications will improve Outcome: Progressing   Problem: Activity: Goal: Risk for activity intolerance will decrease Outcome: Progressing   Problem: Nutrition: Goal: Adequate nutrition will be maintained Outcome: Progressing   Problem: Coping: Goal: Level of anxiety will decrease Outcome: Progressing   Problem: Elimination: Goal: Will not experience complications related to bowel motility Outcome: Progressing   Problem: Pain Managment: Goal: General experience of comfort will improve and/or be controlled Outcome: Progressing   Problem: Safety: Goal: Ability to remain free from injury will improve Outcome: Progressing   Problem: Skin Integrity: Goal: Risk for impaired skin integrity will decrease Outcome: Progressing

## 2024-09-06 NOTE — Progress Notes (Addendum)
 NAME:  [name] [name], MRN:  [id number], DOB:  08-11-1946, LOS: 9 ADMISSION DATE:  08/27/2024, CONSULTATION DATE:  12/9 REFERRING MD:  Zenaida, AHF CHIEF COMPLAINT: biventricular heart failure    History of Present Illness:  78 year old male with past medical history of hypothyroidism, hypertension, hyperlipidemia, paroxysmal atrial fibrillation on Eliquis , chronic biventricular heart failure, ischemic cardiomyopathy, CAD, severe functional MR who was initially admitted on 12/1 with shortness of breath.  Prior to arrival had 3 days of worsening shortness of breath, cough, peripheral edema.  In the ED was in A-fib RVR with rates up to 160s, K5.5, bicarb 19, SCR 1.49, BNP 1633, troponin 11> 15, lactic 2.  Chest x-ray with vascular congestion.  He was admitted to hospitalist and started on diuresis.  Cardiology and heart failure saw patient on 12/2.  PICC placement was ordered for Co. ox assessment, plan for RHC once stabilized.  On 12/3 after PICC was placed, Co. ox 49, Fick CI 1.02.  He was started on milrinone  0.25, continued on Lasix , Diamox , started on IV Amio.  He had a right heart cath on 12/5, T CTS consult for bad workup.  While on milrinone  had brief improvement in Co. ox until 12/8 when he had drop in Coxe to 44%, weight up 5 pounds, increased A-fib rates and was given diuresis.  TCTS was asked for Impella 5.5 placement consult.  Impella was placed on 12/9 and patient was taken to ICU postoperatively.  CCM consulted for help with medical management.  Pertinent  Medical History  hypothyroidism, hypertension, hyperlipidemia, paroxysmal atrial fibrillation on Eliquis , chronic biventricular heart failure, ischemic cardiomyopathy, CAD, severe functional MR  Significant Hospital Events: Including procedures, antibiotic start and stop dates in addition to other pertinent events   12/1: admit for AoC systolic heart failure, started on diuresis  12/2: cardiology and AHF consults  12/3: PICC placed,  coox 49, CI 1.02 > milrinone , ongoing diuresis  12/5: RHC RA 4, RV 39/5, PA 37/24(28), PCW 20, Fick CI 2.4, PAPi 3.25 on milrinone  0.25; TCTS consult for VAD work up  12/8: coox 44% dropped back off, weight up 5lb, afib rate up, given diuresis; consult to TCTS for impella 5.5 placement  12/9: went for impella 5.5>ICU post-op, CCM consult 12/9: Extubated   Interim History / Subjective:  Overnight: No acute events  Patient evaluated bedside this morning.  He states he is doing well.  He does report having some pain to his Impella site, but otherwise feels fine.  He has not had a bowel movement.  Encouraged him to eat and drink today.  Encouraged him to use his incentive spirometer today.  Objective   Blood pressure 119/73, pulse 88, temperature 99.3 F (37.4 C), resp. rate 15, height 5' 10 (1.778 m), weight 75 kg, SpO2 96%. CVP:  [6 mmHg-18 mmHg] 14 mmHg  Vent Mode: PSV;CPAP FiO2 (%):  [32 %-50 %] 32 % Set Rate:  [4 bmp-16 bmp] 4 bmp Vt Set:  [580 mL] 580 mL PEEP:  [5 cmH20] 5 cmH20 Pressure Support:  [10 cmH20] 10 cmH20 Plateau Pressure:  [12 cmH20-17 cmH20] 12 cmH20   Intake/Output Summary (Last 24 hours) at 09/06/2024 0728 Last data filed at 09/06/2024 0700 Gross per 24 hour  Intake 2659.23 ml  Output 795 ml  Net 1864.23 ml   Filed Weights   09/05/24 0500 09/05/24 0654 09/06/24 0500  Weight: 73.1 kg 73.1 kg 75 kg   Drips include amiodarone , milrinone , sodium bicarb  Examination: General: Resting in recliner,  no acute distress HENT: Normocephalic, atraumatic Lungs: Clear to auscultation bilaterally Cardiovascular: irregularly irregular rhythm mpella 5.5 Abdomen: Soft, normal active bowel sound Extremities: Warm with no extremity edema appreciated Neuro: Alert and oriented x 3 GU: Foley in place  Labs: Co.ox 68.2 ABG: pH 7.4, pCO2 40, pO2 93 CBC white count 15.8, hemoglobin 11.5, platelets 147 CMP: Sodium 128, potassium 3.9, creatinine 2.45, baseline seems to be  around 1.8-2.0  Chest x-ray: Cardiomegaly with central vascular congestion and mild basilar interstitial edema with small pleural effusions.  Resolved Hospital Problem list    Assessment & Plan:   This is a 78 year old male with a past medical history of CAD, CKD stage IIIa, hypothyroidism, mitral regurgitation, hypertension who presented to the emergency department on 12/1 with concerns of shortness of breath.  Patient found to have biventricular heart failure.  He worsened during his hospital course requiring Impella placement on 09/05/2024.  Acute on chronic biventricular heart failure  Cardiogenic shock s/p Impella 5.5 placement  Permanent afib  Severe MR  VT/NSVT  CAD  LVAD work up  Patient is still requiring inotropic support with milrinone  and now has mechanical cardiac support with Impella in place.  Patient has tolerated this well.  He has been extubated.  Will attempt to mobilize patient today.  Potential diuresis today. -Heart failure following, appreciate recommendations - Cardiothoracic surgery following, appreciate recommendations - Impella placed on 12/9 - Continue milrinone , wean as tolerated - Trend LDH, CVP, coox, lactate - Patient is still in A-fib, continue amiodarone  drip - Will start bowel regimen to promote bowel movements - keep K>4, mag >2 for arrhythmias  - optimize for LVAD placement  - GDMT when appropriate  AKI on CKD IIIa  hyponatremia Serum creatinine at 2.45 this morning.  Not at baseline.  Anticipate patient start improving with mechanical cardiac support.  Hyponatremia stable 128. - Potential diuresis today - Monitor creatinine function - Monitor BMP - Daily weights, ins and outs - Avoid nephrotoxic agents, renally dose medications - ensure adequate renal perfusion  - Continue to monitor sodium  Prostate abscess versus mass Urology consulted on 12/8.  Recommended treatment.  Will start Zosyn  - Start Zosyn  today 12/10 for 10-day course,  repeat imaging in 2 weeks (12/24)  Post-operative vent management, resolved Patient was extubated yesterday.  Will mobilize today. - Encourage incentive spirometry today - Brovana  nebs Yupelri  nebs  History of hypertension  Hyperlipidemia  Patient is still requiring inotropic support.  Will hold home antihypertensives. - Continue pravastatin  40mg  daily  - no antihypertensives with decompensated heart failure at present  - GDMT when appropriate   History of hypothyroidism  Continue home Synthroid  137 mcg daily  Diet: Regular DVT prophylaxis: On heparin  systemic Dispo: Pending clinical improvement  Labs   CBC: Recent Labs  Lab 09/02/24 0643 09/03/24 0539 09/04/24 0513 09/05/24 0811 09/05/24 1036 09/05/24 1055 09/05/24 1308 09/05/24 1543 09/06/24 0430 09/06/24 0431  WBC 10.2 13.5* 14.3*  --  12.2*  --   --   --  15.8*  --   NEUTROABS 6.7 10.3* 10.5*  --  9.6*  --   --   --  13.1*  --   HGB 13.6 13.0 13.7   < > 11.3* 10.9* 11.6* 10.9* 11.5* 11.2*  HCT 37.9* 36.8* 38.0*   < > 30.8* 32.0* 34.0* 32.0* 31.6* 33.0*  MCV 90.7 91.1 90.9  --  90.3  --   --   --  90.5  --   PLT 151 148* 170  --  144*  --   --   --  147*  --    < > = values in this interval not displayed.    Basic Metabolic Panel: Recent Labs  Lab 09/01/24 0531 09/01/24 1207 09/02/24 0643 09/03/24 0539 09/04/24 0513 09/05/24 0811 09/05/24 0815 09/05/24 1036 09/05/24 1055 09/05/24 1308 09/05/24 1543 09/05/24 1637 09/06/24 0430 09/06/24 0431  NA 131*   < > 131* 133* 133*   < > 131* 129*   < > 130* 129* 128* 128* 127*  K 3.3*   < > 3.4* 3.8 4.5   < > 3.4* 3.4*   < > 3.6 4.0 4.1 3.9 4.1  CL 88*  --  91* 95* 95*  --  90* 92*  --   --   --  92* 92*  --   CO2 29  --  28 27 25   --   --  25  --   --   --  25 26  --   GLUCOSE 204*  --  164* 118* 114*  --  175* 156*  --   --   --  122* 125*  --   BUN 50*  --  51* 44* 46*  --  48* 50*  --   --   --  49* 46*  --   CREATININE 2.03*  --  2.05* 1.83* 1.99*  --   2.60* 2.34*  --   --   --  2.39* 2.45*  --   CALCIUM 8.2*  --  8.3* 9.0 9.4  --   --  8.1*  --   --   --  8.1* 8.3*  --   MG 2.1  --  2.1 2.1 2.0  --   --  1.9  --   --   --   --   --   --   PHOS  --   --   --   --   --   --   --  3.8  --   --   --   --   --   --    < > = values in this interval not displayed.   GFR: Estimated Creatinine Clearance: 25.7 mL/min (A) (by C-G formula based on SCr of 2.45 mg/dL (H)). Recent Labs  Lab 09/03/24 0539 09/04/24 0513 09/05/24 1036 09/06/24 0430  WBC 13.5* 14.3* 12.2* 15.8*    Liver Function Tests: Recent Labs  Lab 09/02/24 0643 09/03/24 0539 09/04/24 0513 09/05/24 1036 09/06/24 0430  AST 18 16 17 15  13*  ALT 17 16 16 12 12   ALKPHOS 54 50 58 46 49  BILITOT 1.5* 1.4* 1.7* 1.3* 1.6*  PROT 6.9 6.6 7.1 5.7* 6.0*  ALBUMIN 4.0 3.7 3.7 3.1* 3.2*   No results for input(s): LIPASE, AMYLASE in the last 168 hours. No results for input(s): AMMONIA in the last 168 hours.  ABG    Component Value Date/Time   PHART 7.414 09/06/2024 0431   PCO2ART 40.2 09/06/2024 0431   PO2ART 93 09/06/2024 0431   HCO3 25.7 09/06/2024 0431   TCO2 27 09/06/2024 0431   ACIDBASEDEF 1.0 08/31/2023 1020   O2SAT 68.2 09/06/2024 0536     Coagulation Profile: No results for input(s): INR, PROTIME in the last 168 hours.  Cardiac Enzymes: No results for input(s): CKTOTAL, CKMB, CKMBINDEX, TROPONINI in the last 168 hours.  HbA1C: Hgb A1c MFr Bld  Date/Time Value Ref Range Status  09/01/2024 05:00 AM 5.7 (H) 4.8 - 5.6 %  Final    Comment:    (NOTE) Diagnosis of Diabetes The following HbA1c ranges recommended by the American Diabetes Association (ADA) may be used as an aid in the diagnosis of diabetes mellitus.  Hemoglobin             Suggested A1C NGSP%              Diagnosis  <5.7                   Non Diabetic  5.7-6.4                Pre-Diabetic  >6.4                   Diabetic  <7.0                   Glycemic control for                        adults with diabetes.    12/30/2023 08:22 AM 5.9 4.6 - 6.5 % Final    Comment:    Glycemic Control Guidelines for People with Diabetes:Non Diabetic:  <6%Goal of Therapy: <7%Additional Action Suggested:  >8%     CBG: Recent Labs  Lab 09/05/24 1630 09/05/24 2011 09/05/24 2305 09/06/24 0428 09/06/24 0642  GLUCAP 120* 134* 118* 127* 153*    Review of Systems:   As above   Past Medical History:  He,  has a past medical history of Atrial fibrillation (HCC), CHF (congestive heart failure) (HCC), Cirrhosis (HCC), ED (erectile dysfunction), Hyperglycemia, Hyperlipidemia, Hypertension, Internal hemorrhoids, Myocardial infarction (HCC) (2008), SCCA (squamous cell carcinoma) of skin, Skin cancer of face, and Thyroid  disease.   Surgical History:   Past Surgical History:  Procedure Laterality Date   A-Fib Cardiomyopathy [facility])  10/2006   EF 25 - 35%   DOPPLER ECHOCARDIOGRAPHY  10/31/2006   EF 25-35% Mod. M.R., mild -mod TR   RIGHT HEART CATH N/A 09/01/2024   Procedure: RIGHT HEART CATH;  Surgeon: Cherrie Toribio SAUNDERS, MD;  Location: [facility] INVASIVE CV LAB;  Service: Cardiovascular;  Laterality: N/A;   RIGHT/LEFT HEART CATH AND CORONARY ANGIOGRAPHY Bilateral 08/31/2023   Procedure: RIGHT/LEFT HEART CATH AND CORONARY ANGIOGRAPHY;  Surgeon: Mady Bruckner, MD;  Location: [facility] INVASIVE CV LAB;  Service: Cardiovascular;  Laterality: Bilateral;     Social History:   reports that he quit smoking about 58 years ago. His smoking use included cigarettes. He has a 0.3 pack-year smoking history. He has never used smokeless tobacco. He reports that he does not currently use alcohol. He reports that he does not use drugs.   Family History:  His family history includes Cancer in his mother; Colon cancer in his mother; Colon polyps in his mother; Heart disease in his father; Heart disease (age of onset: 80) in his brother; Hyperlipidemia in his father; Hypertension in his mother. There is no  history of Prostate cancer.   Allergies No Known Allergies   Home Medications  Prior to Admission medications   Medication Sig Start Date End Date Taking? Authorizing Provider  albuterol  (VENTOLIN  HFA) 108 (90 Base) MCG/ACT inhaler Inhale 2 puffs into the lungs every 4 (four) hours as needed for wheezing or shortness of breath. 08/27/24  Yes Graham, Laura E, PA-C  amoxicillin -clavulanate (AUGMENTIN ) 875-125 MG tablet Take 1 tablet by mouth 2 (two) times daily.   Yes [provider]  apixaban  (ELIQUIS ) 5 MG TABS tablet Take 1  tablet by mouth twice daily 08/16/24  Yes End, Lonni, MD  carvedilol  (COREG ) 25 MG tablet TAKE 1 TABLET BY MOUTH TWICE DAILY WITH A MEAL 06/12/24  Yes End, Lonni, MD  digoxin  (LANOXIN ) 0.125 MG tablet Take 0.125 mg by mouth daily.   Yes [provider]  doxazosin  (CARDURA ) 4 MG tablet Take 1 tablet (4 mg total) by mouth at bedtime. 01/06/24  Yes Cleatus Arlyss RAMAN, MD  empagliflozin  (JARDIANCE ) 10 MG TABS tablet Take 1 tablet (10 mg total) by mouth daily before breakfast. 10/04/23  Yes End, Lonni, MD  ENTRESTO  24-26 MG Take 1 tablet by mouth 2 (two) times daily. 12/12/18  Yes Cleatus Arlyss RAMAN, MD  fish oil-omega-3 fatty acids 1000 MG capsule Take 1 g by mouth daily.   Yes [provider]  fluticasone -salmeterol (ADVAIR) 250-50 MCG/ACT AEPB INHALE 1 DOSE BY MOUTH IN THE MORNING AND AT BEDTIME RINSE MOUTH AFTER USE 08/20/24  Yes Cleatus Arlyss RAMAN, MD  furosemide  (LASIX ) 20 MG tablet Take 1-2 tablets (20-40 mg total) by mouth daily. Patient taking differently: Take 40 mg by mouth 3 (three) times a week. 01/06/24  Yes Cleatus Arlyss RAMAN, MD  furosemide  (LASIX ) 40 MG tablet Take 40 mg by mouth daily. 05/08/24  Yes [provider]  levothyroxine  (SYNTHROID ) 137 MCG tablet Take 1 tablet (137 mcg total) by mouth daily before breakfast. 01/06/24  Yes Cleatus Arlyss RAMAN, MD  Melatonin 5 MG CAPS Take 5 mg by mouth daily.  12/18/14  Yes [provider]  Multiple Vitamin (MULTIVITAMIN) tablet Take 1 tablet by mouth daily.   Yes [provider]  pravastatin  (PRAVACHOL ) 40 MG tablet Take 1 tablet (40 mg total) by mouth daily. Patient taking differently: Take 40 mg by mouth at bedtime. 01/06/24  Yes Cleatus Arlyss RAMAN, MD  sildenafil  (VIAGRA ) 100 MG tablet Take 1 tablet (100 mg total) by mouth daily as needed for erectile dysfunction. 03/01/24  Yes Cleatus Arlyss RAMAN, MD  spironolactone  (ALDACTONE ) 25 MG tablet Take 1 tablet (25 mg total) by mouth daily. 01/06/24  Yes Cleatus Arlyss RAMAN, MD     Critical care time:     Libby Blanch, DO Internal Medicine Resident PGY-3   Please see [email or web address] for pager details.  From 7A-7P if no response, please call [phone] After hours, please call ELink [phone]

## 2024-09-06 NOTE — Progress Notes (Addendum)
 Advanced Heart Failure Rounding Note  Cardiologist: [name], MD  AHF: Dr. Zenaida  Chief Complaint: A/c Systolic HF Patient Profile   78 y/o male w/ chronic systolic heart failure w/ biventricular dysfunction d/t mixed ischemic/nonischemic CM, CAD, severe functional MR and permanent atrial fibrillation admitted w/ a/c CHF w/ NYHA Class IV symptoms. Now w/ low output. Initial Co-ox 47%. Started on Milrinone .   Subjective:   12/9 S/P Impella 5.5 Placement . CVP low. No diuretics given.   Continue milrinone  0.25 mcg + amio 60 mg per  Flow (Liters/min): 4.5 Liters/min Performance Level: P7 Impella @ P7 Flow 4.7   Complaining of R upper chest soreness.    Objective:    Weight Range: 75 kg Body mass index is 23.72 kg/m.   Vital Signs:   Temp:  [96.1 F (35.6 C)-99.7 F (37.6 C)] 99.3 F (37.4 C) (12/10 0600) Pulse Rate:  [47-131] 88 (12/10 0725) Resp:  [12-26] 15 (12/10 0700) BP: (98-137)/(66-104) 119/73 (12/10 0700) SpO2:  [93 %-99 %] 96 % (12/10 0700) Arterial Line BP: (110-155)/(58-78) 144/73 (12/10 0700) FiO2 (%):  [32 %-50 %] 32 % (12/10 0725) Weight:  [75 kg] 75 kg (12/10 0500) Last BM Date : 09/02/24  Weight change: Filed Weights   09/05/24 0500 09/05/24 0654 09/06/24 0500  Weight: 73.1 kg 73.1 kg 75 kg   Intake/Output:  Intake/Output Summary (Last 24 hours) at 09/06/2024 0736 Last data filed at 09/06/2024 0700 Gross per 24 hour  Intake 2659.23 ml  Output 795 ml  Net 1864.23 ml    Physical Exam  General:   No resp difficulty. In the chair  Neck: no JVD.  Cor: Irregular rate & rhythm. Impella 5.5  Lungs: clear Abdomen: soft, nontender, nondistended.  Extremities: no  edema Neuro: alert & oriented x3 GU: Foley Pink tinged.    Telemetry   A fib 100s -120s.  Labs    CBC Recent Labs    09/05/24 1036 09/05/24 1055 09/06/24 0430 09/06/24 0431  WBC 12.2*  --  15.8*  --   NEUTROABS 9.6*  --  13.1*  --   HGB 11.3*   < > 11.5* 11.2*   HCT 30.8*   < > 31.6* 33.0*  MCV 90.3  --  90.5  --   PLT 144*  --  147*  --    < > = values in this interval not displayed.   Basic Metabolic Panel Recent Labs    87/91/74 0513 09/05/24 0811 09/05/24 1036 09/05/24 1055 09/05/24 1637 09/06/24 0430 09/06/24 0431  NA 133*   < > 129*   < > 128* 128* 127*  K 4.5   < > 3.4*   < > 4.1 3.9 4.1  CL 95*   < > 92*  --  92* 92*  --   CO2 25  --  25  --  25 26  --   GLUCOSE 114*   < > 156*  --  122* 125*  --   BUN 46*   < > 50*  --  49* 46*  --   CREATININE 1.99*   < > 2.34*  --  2.39* 2.45*  --   CALCIUM 9.4  --  8.1*  --  8.1* 8.3*  --   MG 2.0  --  1.9  --   --   --   --   PHOS  --   --  3.8  --   --   --   --    < > =  values in this interval not displayed.   Liver Function Tests Recent Labs    09/05/24 1036 09/06/24 0430  AST 15 13*  ALT 12 12  ALKPHOS 46 49  BILITOT 1.3* 1.6*  PROT 5.7* 6.0*  ALBUMIN 3.1* 3.2*   BNP (last 3 results) Recent Labs    08/27/24 1907 08/28/24 0204  BNP 1,633.9* 1,952.5*   Medications:    Scheduled Medications:  arformoterol   15 mcg Nebulization BID   Chlorhexidine  Gluconate Cloth  6 each Topical Daily   feeding supplement  237 mL Oral BID BM   insulin  aspart  0-9 Units Subcutaneous TID WC   levothyroxine   137 mcg Oral QAC breakfast   pantoprazole  (PROTONIX ) IV  40 mg Intravenous QHS   polyethylene glycol  17 g Oral Daily   pravastatin   40 mg Oral QHS   revefenacin   175 mcg Nebulization Daily   senna-docusate  2 tablet Oral QHS   sodium chloride  flush  10-40 mL Intracatheter Q12H     Infusions:  amiodarone  60 mg/hr (09/06/24 0700)   milrinone  0.25 mcg/kg/min (09/06/24 0700)   sodium bicarbonate  25 mEq (Impella PURGE) in dextrose  5 % 1000 mL bag      PRN Medications: sodium chloride , acetaminophen  **OR** acetaminophen  (TYLENOL ) oral liquid 160 mg/5 mL **OR** acetaminophen , docusate sodium , fentaNYL  (SUBLIMAZE ) injection, melatonin, ondansetron  (ZOFRAN ) IV, oxyCODONE ,  polyethylene glycol, sodium chloride  flush  Assessment/Plan   1. Acute on Chronic Systolic Heart Failure w/ Low Output  - mixed ischemic/idiopathic CM  - Echo 2/25: LVEF 25 to 30%, moderate to severely dilated, mildly reduced RV function - Echo 12/1 EF <20%, RV not well visualized  - RHC 12/5 (on milrinone  0.25): Well compensated. mildly elevated L sided filling and PA pressures with mildly reduced CO by TD 4.3/2.3 - Initial co-ox 47%. Started on milrinone  0.25. CO-OX remained suboptimal. Escalation of care today --> MCS Impella 5.5 placed.  - Taken to OR for Impella 5.5 .  - Impella at P7 Flow 4.7. LDH stable.  - Check limited  Echo for positioning.  - Continue milrinone  0.25 mcg. CO-OX stable, 68%.   - CVP 9 .  Consider po lasix .  - off digoxin  given elevated level and AKI. - other GDMT limited by renal function - Discussed at [facility] today  - Keep PICC. Remove RIJ Ambulate today.   2. Permanent Afib  - Continue amio to 60/hr with VR  - Start Heparin  drip.    3. Severe MR - functional  - LV cavity more dilated on this admission echo in setting of a/c CHF exacerbation, LVIDs 7.7 cm (6.40 cm)   4. VT/NSVT - in setting of worsening HF, 17 beat run tele 12/2 - increased frequency of NSVT runs  - keep K > 4.0 and Mg > 2.0 - Continue IV amio  - he previously declined ICD, minimal role at this time for ICD   5. CAD - [facility] 08/31/2023: LMCA with 20% distal stenosis. LAD with sequential 50% proximal and mid lesions. LCx with 30% mid stenosis, and proximal/mid RCA with 30% disease and 70% stenosis at the ostium of AM1  -  continue statin - no ASA given need for Eliquis   6. AKI - b/l SCr 1.4 - bumped to 2.6-->2.5 -Continue to follow closely. Hopefully will improve with addition of MCS. .  7. Hypokalemia - K stable.   8. H/o Malignant Melanoma - occipital scalp, s/p excision @ [facility] in 2023 - never followed up for surveillance head CTs  -  CT head reassuring  9. Prostatitis Restart  antibiotics today. Keep foley for now.    Length of Stay: 9  Amy Clegg, NP  09/06/2024, 7:36 AM  Advanced Heart Failure Team Pager [phone] (M-F; 7a - 5p)  Please contact [facility] for night-coverage after hours (5p -7a ) and weekends on [email or web address]  Patient seen with NP, I formulated the plan and agree with the above note.   Co-ox 68%, creatinine 2.39 => 2.45.  Impella P7, flow 4.4 and no alarms.  On milrinone  0.25 mcg/kg/min.  Plts stable 147 and LDH normal though urine is blood-tinged.  CVP 9 on my read.   He is in permanent AF with amiodarone  gtt for rate control.   No complaints this morning.   General: NAD Neck: JVP 8 cm, no thyromegaly or thyroid  nodule.  Lungs: Clear to auscultation bilaterally with normal respiratory effort. CV: Nondisplaced PMI.  Heart regular S1/S2, no S3/S4, no murmur.  No peripheral edema.    Abdomen: Soft, nontender, no hepatosplenomegaly, no distention.  Skin: Intact without lesions or rashes.  Neurologic: Alert and oriented x 3.  Psych: Normal affect. Extremities: No clubbing or cyanosis.  HEENT: Normal.   Continue current Impella settings and milrinone , co-ox adequate.  Creatinine fairly stable today.  We are working towards LVAD with this patient, would like to see improvement in renal function prior to LVAD placement.  Echo today for Impella placement.  Will start on heparin  gtt today for AF and Impella. CVP 9, with renal dysfunction will follow for now but will dose IV Lasix  this afternoon if CVP higher.   Mild blood-tinged urine, possibly foley trauma with prostate issues.  LDH and platelets stable and not suggestive of hemolysis.  Will leave foley in a couple more days (possible prostate abscess).  Starting back on antibiotics today, CCM discussing regimen.   Needs to walk in halls today.   CRITICAL CARE Performed by: [name]  Total critical care time: 45 minutes  Critical care time was exclusive of separately billable  procedures and treating other patients.  Critical care was necessary to treat or prevent imminent or life-threatening deterioration.  Critical care was time spent personally by me on the following activities: development of treatment plan with patient and/or surrogate as well as nursing, discussions with consultants, evaluation of patient's response to treatment, examination of patient, obtaining history from patient or surrogate, ordering and performing treatments and interventions, ordering and review of laboratory studies, ordering and review of radiographic studies, pulse oximetry and re-evaluation of patient's condition.  [name] 09/06/2024 8:10 AM

## 2024-09-06 NOTE — Progress Notes (Signed)
 1 Day Post-Op Procedure(s) (LRB): INSERTION, CARDIAC ASSIST DEVICE, IMPELLA (N/A) ECHOCARDIOGRAM, TRANSESOPHAGEAL (N/A) Subjective: Just back from walk No pain currently  Objective: Vital signs in last 24 hours: Temp:  [97.2 F (36.2 C)-99.7 F (37.6 C)] 98.8 F (37.1 C) (12/10 1215) Pulse Rate:  [53-131] 71 (12/10 1300) Cardiac Rhythm: Atrial fibrillation (12/10 1200) Resp:  [12-27] 16 (12/10 1300) BP: (99-134)/(70-104) 129/96 (12/10 1300) SpO2:  [90 %-98 %] 95 % (12/10 1300) Arterial Line BP: (115-155)/(58-78) 144/73 (12/10 0700) FiO2 (%):  [32 %] 32 % (12/10 0725) Weight:  [75 kg] 75 kg (12/10 0500)  Hemodynamic parameters for last 24 hours: CVP:  [5 mmHg-21 mmHg] 12 mmHg  Intake/Output from previous day: 12/09 0701 - 12/10 0700 In: 2659.2 [P.O.:240; I.V.:1913; IV Piggyback:300.1] Out: 795 [Urine:795] Intake/Output this shift: Total I/O In: 323.9 [I.V.:265.5; Other:58.4] Out: 350 [Urine:350]  General appearance: alert, cooperative, and no distress Neurologic: intact Heart: + impella hum Lungs: diminished breath sounds bibasilar  Lab Results: Recent Labs    09/05/24 1036 09/05/24 1055 09/06/24 0430 09/06/24 0431  WBC 12.2*  --  15.8*  --   HGB 11.3*   < > 11.5* 11.2*  HCT 30.8*   < > 31.6* 33.0*  PLT 144*  --  147*  --    < > = values in this interval not displayed.   BMET:  Recent Labs    09/05/24 1637 09/06/24 0430 09/06/24 0431  NA 128* 128* 127*  K 4.1 3.9 4.1  CL 92* 92*  --   CO2 25 26  --   GLUCOSE 122* 125*  --   BUN 49* 46*  --   CREATININE 2.39* 2.45*  --   CALCIUM 8.1* 8.3*  --     PT/INR: No results for input(s): LABPROT, INR in the last 72 hours. ABG    Component Value Date/Time   PHART 7.414 09/06/2024 0431   HCO3 25.7 09/06/2024 0431   TCO2 27 09/06/2024 0431   ACIDBASEDEF 1.0 08/31/2023 1020   O2SAT 68.2 09/06/2024 0536   CBG (last 3)  Recent Labs    09/06/24 0428 09/06/24 0642 09/06/24 1135  GLUCAP 127* 153*  126*    Assessment/Plan: S/P Procedure(s) (LRB): INSERTION, CARDIAC ASSIST DEVICE, IMPELLA (N/A) ECHOCARDIOGRAM, TRANSESOPHAGEAL (N/A) POD # 1 Impella 5.5 Looks good Impella repositioned by Dr. Rolan earlier Creatinine 2.45- monitor   LOS: 9 days    [name] [name] 09/06/2024

## 2024-09-07 ENCOUNTER — Other Ambulatory Visit (HOSPITAL_COMMUNITY): Payer: Self-pay

## 2024-09-07 DIAGNOSIS — N1831 Chronic kidney disease, stage 3a: Secondary | ICD-10-CM | POA: Diagnosis not present

## 2024-09-07 DIAGNOSIS — E871 Hypo-osmolality and hyponatremia: Secondary | ICD-10-CM | POA: Diagnosis not present

## 2024-09-07 DIAGNOSIS — N410 Acute prostatitis: Secondary | ICD-10-CM

## 2024-09-07 DIAGNOSIS — I340 Nonrheumatic mitral (valve) insufficiency: Secondary | ICD-10-CM | POA: Diagnosis not present

## 2024-09-07 DIAGNOSIS — R570 Cardiogenic shock: Secondary | ICD-10-CM | POA: Diagnosis not present

## 2024-09-07 DIAGNOSIS — I5023 Acute on chronic systolic (congestive) heart failure: Secondary | ICD-10-CM | POA: Diagnosis not present

## 2024-09-07 DIAGNOSIS — I4821 Permanent atrial fibrillation: Secondary | ICD-10-CM | POA: Diagnosis not present

## 2024-09-07 DIAGNOSIS — R319 Hematuria, unspecified: Secondary | ICD-10-CM

## 2024-09-07 DIAGNOSIS — N179 Acute kidney failure, unspecified: Secondary | ICD-10-CM | POA: Diagnosis not present

## 2024-09-07 LAB — CBC WITH DIFFERENTIAL/PLATELET
Abs Immature Granulocytes: 0.13 K/uL — ABNORMAL HIGH (ref 0.00–0.07)
Basophils Absolute: 0 K/uL (ref 0.0–0.1)
Basophils Relative: 0 %
Eosinophils Absolute: 0.1 K/uL (ref 0.0–0.5)
Eosinophils Relative: 1 %
HCT: 30.5 % — ABNORMAL LOW (ref 39.0–52.0)
Hemoglobin: 10.8 g/dL — ABNORMAL LOW (ref 13.0–17.0)
Immature Granulocytes: 1 %
Lymphocytes Relative: 6 %
Lymphs Abs: 0.7 K/uL (ref 0.7–4.0)
MCH: 32.5 pg (ref 26.0–34.0)
MCHC: 35.4 g/dL (ref 30.0–36.0)
MCV: 91.9 fL (ref 80.0–100.0)
Monocytes Absolute: 1.1 K/uL — ABNORMAL HIGH (ref 0.1–1.0)
Monocytes Relative: 11 %
Neutro Abs: 8.6 K/uL — ABNORMAL HIGH (ref 1.7–7.7)
Neutrophils Relative %: 81 %
Platelets: 146 K/uL — ABNORMAL LOW (ref 150–400)
RBC: 3.32 MIL/uL — ABNORMAL LOW (ref 4.22–5.81)
RDW: 12.5 % (ref 11.5–15.5)
WBC: 10.7 K/uL — ABNORMAL HIGH (ref 4.0–10.5)
nRBC: 0 % (ref 0.0–0.2)

## 2024-09-07 LAB — COMPREHENSIVE METABOLIC PANEL WITH GFR
ALT: 8 U/L (ref 0–44)
AST: 13 U/L — ABNORMAL LOW (ref 15–41)
Albumin: 2.9 g/dL — ABNORMAL LOW (ref 3.5–5.0)
Alkaline Phosphatase: 48 U/L (ref 38–126)
Anion gap: 7 (ref 5–15)
BUN: 30 mg/dL — ABNORMAL HIGH (ref 8–23)
CO2: 30 mmol/L (ref 22–32)
Calcium: 8 mg/dL — ABNORMAL LOW (ref 8.9–10.3)
Chloride: 91 mmol/L — ABNORMAL LOW (ref 98–111)
Creatinine, Ser: 1.87 mg/dL — ABNORMAL HIGH (ref 0.61–1.24)
GFR, Estimated: 36 mL/min — ABNORMAL LOW (ref >60)
Glucose, Bld: 152 mg/dL — ABNORMAL HIGH (ref 70–99)
Potassium: 3.2 mmol/L — ABNORMAL LOW (ref 3.5–5.1)
Sodium: 128 mmol/L — ABNORMAL LOW (ref 135–145)
Total Bilirubin: 1.5 mg/dL — ABNORMAL HIGH (ref 0.0–1.2)
Total Protein: 5.8 g/dL — ABNORMAL LOW (ref 6.5–8.1)

## 2024-09-07 LAB — COOXEMETRY PANEL
Carboxyhemoglobin: 1.5 % (ref 0.5–1.5)
Methemoglobin: 0.9 % (ref 0.0–1.5)
O2 Saturation: 66.9 %
Total hemoglobin: 12.1 g/dL (ref 12.0–16.0)

## 2024-09-07 LAB — BASIC METABOLIC PANEL WITH GFR
Anion gap: 13 (ref 5–15)
BUN: 31 mg/dL — ABNORMAL HIGH (ref 8–23)
CO2: 29 mmol/L (ref 22–32)
Calcium: 8.2 mg/dL — ABNORMAL LOW (ref 8.9–10.3)
Chloride: 90 mmol/L — ABNORMAL LOW (ref 98–111)
Creatinine, Ser: 2.03 mg/dL — ABNORMAL HIGH (ref 0.61–1.24)
GFR, Estimated: 33 mL/min — ABNORMAL LOW (ref >60)
Glucose, Bld: 128 mg/dL — ABNORMAL HIGH (ref 70–99)
Potassium: 3.6 mmol/L (ref 3.5–5.1)
Sodium: 132 mmol/L — ABNORMAL LOW (ref 135–145)

## 2024-09-07 LAB — HEPARIN LEVEL (UNFRACTIONATED): Heparin Unfractionated: 1.08 [IU]/mL — ABNORMAL HIGH (ref 0.30–0.70)

## 2024-09-07 LAB — LACTATE DEHYDROGENASE: LDH: 126 U/L (ref 105–235)

## 2024-09-07 LAB — MAGNESIUM: Magnesium: 2.1 mg/dL (ref 1.7–2.4)

## 2024-09-07 LAB — APTT: aPTT: 52 s — ABNORMAL HIGH (ref 24–36)

## 2024-09-07 MED ORDER — FUROSEMIDE 10 MG/ML IJ SOLN
40.0000 mg | Freq: Once | INTRAMUSCULAR | Status: AC
  Administered 2024-09-07: 40 mg via INTRAVENOUS
  Filled 2024-09-07: qty 4

## 2024-09-07 MED ORDER — POTASSIUM CHLORIDE CRYS ER 20 MEQ PO TBCR: 40.0000 meq | EXTENDED_RELEASE_TABLET | Freq: Two times a day (BID) | ORAL | Status: DC

## 2024-09-07 MED ORDER — POTASSIUM CHLORIDE CRYS ER 20 MEQ PO TBCR
40.0000 meq | EXTENDED_RELEASE_TABLET | Freq: Once | ORAL | Status: AC
  Administered 2024-09-07: 40 meq via ORAL
  Filled 2024-09-07: qty 2

## 2024-09-07 MED ORDER — POTASSIUM CHLORIDE CRYS ER 20 MEQ PO TBCR
40.0000 meq | EXTENDED_RELEASE_TABLET | Freq: Two times a day (BID) | ORAL | Status: AC
  Administered 2024-09-07 (×2): 40 meq via ORAL
  Filled 2024-09-07 (×2): qty 2

## 2024-09-07 NOTE — Progress Notes (Addendum)
 Physical Therapy Treatment Patient Details Name: [name] [name] MRN: [id number] DOB: [birth year] Today's Date: 09/07/2024   History of Present Illness 78 yo male admitted 11/30 with SOB Pt with workup for PNA and acute CHF exacerbation. 12/5 RHC revealed biventricular heart failure. 12/9 Impella insertion, plan for LVAD. PMH CHF, a-fib on Eliquis , HTN, Hypothyroidism, MI,SCCA, cirrhosis   PT Comments  Pt received in recliner with RN present and agreeable for PT session. Pt was supervision/CGA for all mobility with +2 for line management. Pt was able to perform multiple sit<>stand transitions with no UE support with supervision for safety. Also able to ambulate ~874ft with use of RW with steady gait pattern. Pt reported fatigue at end of session with desire to rest. Continue to recommend d/c home with no PT follow up at this time. Will continue to follow and assess progress.   HR 110-152 BPM P-7   If plan is discharge home, recommend the following: Assist for transportation;Help with stairs or ramp for entrance   Can travel by private vehicle      Yes  Equipment Recommendations  None recommended by PT       Precautions / Restrictions Precautions Precautions: Fall Recall of Precautions/Restrictions: Intact Precaution/Restrictions Comments: Impella, watch HR Restrictions Weight Bearing Restrictions Per Provider Order: No     Mobility  Bed Mobility  General bed mobility comments: NT, pt received in recliner    Transfers Overall transfer level: Needs assistance Equipment used: None, Rolling walker (2 wheels) Transfers: Sit to/from Stand Sit to Stand: Supervision    General transfer comment: supervision for safety with lines, able to stand with hands on knees    Ambulation/Gait Ambulation/Gait assistance: Contact guard assist, +2 safety/equipment Gait Distance (Feet): 800 Feet Assistive device: Rolling walker (2 wheels) Gait Pattern/deviations: Step-through pattern, Decreased  stride length Gait velocity: slightly decreased     General Gait Details: CGA for safety with +2 for line management. Steady with no overt LOB     Balance Overall balance assessment: Needs assistance Sitting-balance support: No upper extremity supported, Feet supported Sitting balance-Leahy Scale: Good     Standing balance support: No upper extremity supported Standing balance-Leahy Scale: Fair       Hotel Manager: No apparent difficulties  Cognition Arousal: Alert Behavior During Therapy: WFL for tasks assessed/performed   PT - Cognitive impairments: No apparent impairments    Following commands: Intact      Cueing Cueing Techniques: Verbal cues  Exercises Other Exercises Other Exercises: x5 sit<>stand with no UE support    General Comments General comments (skin integrity, edema, etc.): Impella site inspected and intact pre and post-mobility      Pertinent Vitals/Pain Pain Assessment Pain Assessment: Faces Faces Pain Scale: Hurts a little bit Pain Location: impella insertion site Pain Descriptors / Indicators: Discomfort Pain Intervention(s): Limited activity within patient's tolerance, Monitored during session, Repositioned     PT Goals (current goals can now be found in the care plan section) Acute Rehab PT Goals Patient Stated Goal: to go home PT Goal Formulation: With patient/family Time For Goal Achievement: 09/21/24 Potential to Achieve Goals: Good Progress towards PT goals: Progressing toward goals    Frequency    Min 1X/week       AM-PAC PT 6 Clicks Mobility   Outcome Measure  Help needed turning from your back to your side while in a flat bed without using bedrails?: A Little Help needed moving from lying on your back to sitting on the side of a  flat bed without using bedrails?: A Little Help needed moving to and from a bed to a chair (including a wheelchair)?: A Little Help needed standing up from a chair  using your arms (e.g., wheelchair or bedside chair)?: A Little Help needed to walk in hospital room?: A Little Help needed climbing 3-5 steps with a railing? : A Lot 6 Click Score: 17    End of Session   Activity Tolerance: Patient tolerated treatment well Patient left: in chair;with call bell/phone within reach;with nursing/sitter in room Nurse Communication: Mobility status (RN present throughout session) PT Visit Diagnosis: Other abnormalities of gait and mobility (R26.89)     Time: 9041-8976 PT Time Calculation (min) (ACUTE ONLY): 25 min  Charges:    $Gait Training: 8-22 mins $Therapeutic Activity: 8-22 mins PT General Charges $$ ACUTE PT VISIT: 1 Visit                    Kate ORN, PT, DPT Secure Chat Preferred  Rehab Office [phone]   Kate BRAVO Wendolyn 09/07/2024, 11:49 AM

## 2024-09-07 NOTE — Plan of Care (Signed)
°  Problem: Clinical Measurements: Goal: Ability to maintain clinical measurements within normal limits will improve Outcome: Progressing Goal: Respiratory complications will improve Outcome: Progressing   Problem: Activity: Goal: Risk for activity intolerance will decrease Outcome: Progressing   Problem: Coping: Goal: Level of anxiety will decrease Outcome: Progressing   Problem: Pain Managment: Goal: General experience of comfort will improve and/or be controlled Outcome: Progressing   Problem: Safety: Goal: Ability to remain free from injury will improve Outcome: Progressing   Problem: Skin Integrity: Goal: Risk for impaired skin integrity will decrease Outcome: Progressing   Problem: Cardiovascular: Goal: Vascular access site(s) Level 0-1 will be maintained Outcome: Progressing

## 2024-09-07 NOTE — Progress Notes (Signed)
 NAME:  [name] [name], MRN:  [id number], DOB:  Dec 23, 1945, LOS: 10 ADMISSION DATE:  08/27/2024, CONSULTATION DATE:  12/9 REFERRING MD:  Zenaida, AHF CHIEF COMPLAINT: biventricular heart failure    History of Present Illness:  78 year old male with past medical history of hypothyroidism, hypertension, hyperlipidemia, paroxysmal atrial fibrillation on Eliquis , chronic biventricular heart failure, ischemic cardiomyopathy, CAD, severe functional MR who was initially admitted on 12/1 with shortness of breath.  Prior to arrival had 3 days of worsening shortness of breath, cough, peripheral edema.  In the ED was in A-fib RVR with rates up to 160s, K5.5, bicarb 19, SCR 1.49, BNP 1633, troponin 11> 15, lactic 2.  Chest x-ray with vascular congestion.  He was admitted to hospitalist and started on diuresis.  Cardiology and heart failure saw patient on 12/2.  PICC placement was ordered for Co. ox assessment, plan for RHC once stabilized.  On 12/3 after PICC was placed, Co. ox 49, Fick CI 1.02.  He was started on milrinone  0.25, continued on Lasix , Diamox , started on IV Amio.  He had a right heart cath on 12/5, T CTS consult for bad workup.  While on milrinone  had brief improvement in Co. ox until 12/8 when he had drop in Coxe to 44%, weight up 5 pounds, increased A-fib rates and was given diuresis.  TCTS was asked for Impella 5.5 placement consult.  Impella was placed on 12/9 and patient was taken to ICU postoperatively.  CCM consulted for help with medical management.  Pertinent  Medical History  hypothyroidism, hypertension, hyperlipidemia, paroxysmal atrial fibrillation on Eliquis , chronic biventricular heart failure, ischemic cardiomyopathy, CAD, severe functional MR  Significant Hospital Events: Including procedures, antibiotic start and stop dates in addition to other pertinent events   12/1: admit for AoC systolic heart failure, started on diuresis  12/2: cardiology and AHF consults  12/3: PICC placed,  coox 49, CI 1.02 > milrinone , ongoing diuresis  12/5: RHC RA 4, RV 39/5, PA 37/24(28), PCW 20, Fick CI 2.4, PAPi 3.25 on milrinone  0.25; TCTS consult for VAD work up  12/8: coox 44% dropped back off, weight up 5lb, afib rate up, given diuresis; consult to TCTS for impella 5.5 placement  12/9: went for impella 5.5>ICU post-op, CCM consult 12/9: Extubated  12/10: Heparin  started  Interim History / Subjective:  Overnight: No acute events  Patient evaluated bedside this morning.  Patient states he is doing well.  He walked yesterday.  He states his stomach is growling.  He has not had a bowel movement.  He has a poor appetite.  Objective   Blood pressure 119/79, pulse 69, temperature 99.3 F (37.4 C), resp. rate 19, height 5' 10 (1.778 m), weight 74.7 kg, SpO2 93%. CVP:  [3 mmHg-21 mmHg] 7 mmHg  FiO2 (%):  [32 %] 32 %   Intake/Output Summary (Last 24 hours) at 09/07/2024 9366 Last data filed at 09/07/2024 0600 Gross per 24 hour  Intake 1459.28 ml  Output 1670 ml  Net -210.72 ml   Filed Weights   09/05/24 0654 09/06/24 0500 09/07/24 0500  Weight: 73.1 kg 75 kg 74.7 kg   Drips include amiodarone , milrinone , sodium bicarb, heparin    Examination: General: Sitting in recliner, no acute distress HENT: Normocephalic, atraumatic Lungs: Clear to auscultation bilaterally Cardiovascular: Regularly irregular rhythm, Impella 5.5 in place Abdomen: Soft, normo active bowel sound, nondistended Extremities: 1+ edema to bilateral lower extremities Neuro: Alert and oriented x 3 GU: Foley in place  Labs: Co.ox 66.9 Magnesium  2.1 LDH  126 CBC: White count 10.7, hemoglobin 10.8, platelets 146 CMP: Sodium 128, potassium 3.2, creatinine 1.87   Resolved Hospital Problem list    Assessment & Plan:   This is a 78 year old male with a past medical history of CAD, CKD stage IIIa, hypothyroidism, mitral regurgitation, hypertension who presented to the emergency department on 12/1 with concerns of  shortness of breath.  Patient found to have biventricular heart failure.  He worsened during his hospital course requiring Impella placement on 09/05/2024.  Acute on chronic biventricular heart failure  Cardiogenic shock s/p Impella 5.5 placement  Permanent afib  Severe MR  VT/NSVT  CAD  LVAD work up  Patient still requiring milrinone .  He was up and moving yesterday.  He is tolerating this well.  Impella was repositioned yesterday and he tolerated this well.  Heart failure team optimizing patient for potential LVAD placement.  PT/OT orders placed today for mobilization.  Patient still in A-fib. - PT/OT following - Heart failure following, appreciate recommendations - Cardiothoracic surgery following, appreciate recommendations - Impella placed on 12/9 - Continue milrinone , wean as tolerated - Trend LDH, CVP, coox, lactate - Patient is still in A-fib, continue amiodarone  drip - Will start bowel regimen to promote bowel movements - keep K>4, mag >2 for arrhythmias  - optimize for LVAD placement  - GDMT when appropriate  AKI on CKD IIIa  Hyponatremia Hypokalemia Creatinine down to 1.7 this morning.  Replating potassium.  Will monitor sodium.  This is likely hypervolemic hyponatremia. - Potential diuresis today - Monitor creatinine function - Monitor BMP - Daily weights, ins and outs - Avoid nephrotoxic agents, renally dose medications - ensure adequate renal perfusion  - Continue to monitor sodium  Prostate abscess versus mass Urology consulted on 12/8.  Recommended treatment. Tolerated Zosyn  well. - Continue Zosyn  10-day course end date 12/20, repeat imaging in 2 weeks (12/24)  Post-operative vent management, resolved Patient was extubated Now. Will mobilize today. - Encourage incentive spirometry today - Brovana  nebs Yupelri  nebs  History of hypertension  Hyperlipidemia  Patient is still requiring inotropic support.  Will hold home antihypertensives. - Continue  pravastatin  40mg  daily  - no antihypertensives with decompensated heart failure at present  - GDMT when appropriate   History of hypothyroidism  Continue home Synthroid  137 mcg daily  Diet: Regular DVT prophylaxis: On heparin  systemic Dispo: Pending clinical improvement  Labs   CBC: Recent Labs  Lab 09/03/24 0539 09/04/24 0513 09/05/24 0811 09/05/24 1036 09/05/24 1055 09/05/24 1308 09/05/24 1543 09/06/24 0430 09/06/24 0431 09/07/24 0420  WBC 13.5* 14.3*  --  12.2*  --   --   --  15.8*  --  10.7*  NEUTROABS 10.3* 10.5*  --  9.6*  --   --   --  13.1*  --  8.6*  HGB 13.0 13.7   < > 11.3*   < > 11.6* 10.9* 11.5* 11.2* 10.8*  HCT 36.8* 38.0*   < > 30.8*   < > 34.0* 32.0* 31.6* 33.0* 30.5*  MCV 91.1 90.9  --  90.3  --   --   --  90.5  --  91.9  PLT 148* 170  --  144*  --   --   --  147*  --  146*   < > = values in this interval not displayed.    Basic Metabolic Panel: Recent Labs  Lab 09/02/24 0643 09/03/24 0539 09/04/24 0513 09/05/24 0811 09/05/24 0815 09/05/24 1036 09/05/24 1055 09/05/24 1543 09/05/24 1637 09/06/24 0430  09/06/24 0431 09/07/24 0420  NA 131* 133* 133*   < > 131* 129*   < > 129* 128* 128* 127* 128*  K 3.4* 3.8 4.5   < > 3.4* 3.4*   < > 4.0 4.1 3.9 4.1 3.2*  CL 91* 95* 95*  --  90* 92*  --   --  92* 92*  --  91*  CO2 28 27 25   --   --  25  --   --  25 26  --  30  GLUCOSE 164* 118* 114*  --  175* 156*  --   --  122* 125*  --  152*  BUN 51* 44* 46*  --  48* 50*  --   --  49* 46*  --  30*  CREATININE 2.05* 1.83* 1.99*  --  2.60* 2.34*  --   --  2.39* 2.45*  --  1.87*  CALCIUM 8.3* 9.0 9.4  --   --  8.1*  --   --  8.1* 8.3*  --  8.0*  MG 2.1 2.1 2.0  --   --  1.9  --   --   --   --   --  2.1  PHOS  --   --   --   --   --  3.8  --   --   --   --   --   --    < > = values in this interval not displayed.   GFR: Estimated Creatinine Clearance: 33.6 mL/min (A) (by C-G formula based on SCr of 1.87 mg/dL (H)). Recent Labs  Lab 09/04/24 0513  09/05/24 1036 09/06/24 0430 09/07/24 0420  WBC 14.3* 12.2* 15.8* 10.7*    Liver Function Tests: Recent Labs  Lab 09/03/24 0539 09/04/24 0513 09/05/24 1036 09/06/24 0430 09/07/24 0420  AST 16 17 15  13* 13*  ALT 16 16 12 12 8   ALKPHOS 50 58 46 49 48  BILITOT 1.4* 1.7* 1.3* 1.6* 1.5*  PROT 6.6 7.1 5.7* 6.0* 5.8*  ALBUMIN 3.7 3.7 3.1* 3.2* 2.9*   No results for input(s): LIPASE, AMYLASE in the last 168 hours. No results for input(s): AMMONIA in the last 168 hours.  ABG    Component Value Date/Time   PHART 7.414 09/06/2024 0431   PCO2ART 40.2 09/06/2024 0431   PO2ART 93 09/06/2024 0431   HCO3 25.7 09/06/2024 0431   TCO2 27 09/06/2024 0431   ACIDBASEDEF 1.0 08/31/2023 1020   O2SAT 66.9 09/07/2024 0503     Coagulation Profile: No results for input(s): INR, PROTIME in the last 168 hours.  Cardiac Enzymes: No results for input(s): CKTOTAL, CKMB, CKMBINDEX, TROPONINI in the last 168 hours.  HbA1C: Hgb A1c MFr Bld  Date/Time Value Ref Range Status  09/01/2024 05:00 AM 5.7 (H) 4.8 - 5.6 % Final    Comment:    (NOTE) Diagnosis of Diabetes The following HbA1c ranges recommended by the American Diabetes Association (ADA) may be used as an aid in the diagnosis of diabetes mellitus.  Hemoglobin             Suggested A1C NGSP%              Diagnosis  <5.7                   Non Diabetic  5.7-6.4                Pre-Diabetic  >6.4  Diabetic  <7.0                   Glycemic control for                       adults with diabetes.    12/30/2023 08:22 AM 5.9 4.6 - 6.5 % Final    Comment:    Glycemic Control Guidelines for People with Diabetes:Non Diabetic:  <6%Goal of Therapy: <7%Additional Action Suggested:  >8%     CBG: Recent Labs  Lab 09/05/24 2011 09/05/24 2305 09/06/24 0428 09/06/24 0642 09/06/24 1135  GLUCAP 134* 118* 127* 153* 126*    Review of Systems:   As above   Past Medical History:  He,  has a past medical  history of Atrial fibrillation (HCC), CHF (congestive heart failure) (HCC), Cirrhosis (HCC), ED (erectile dysfunction), Hyperglycemia, Hyperlipidemia, Hypertension, Internal hemorrhoids, Myocardial infarction (HCC) (2008), SCCA (squamous cell carcinoma) of skin, Skin cancer of face, and Thyroid  disease.   Surgical History:   Past Surgical History:  Procedure Laterality Date   A-Fib Cardiomyopathy [facility])  10/2006   EF 25 - 35%   DOPPLER ECHOCARDIOGRAPHY  10/31/2006   EF 25-35% Mod. M.R., mild -mod TR   PLACEMENT OF IMPELLA LEFT VENTRICULAR ASSIST DEVICE N/A 09/05/2024   Procedure: INSERTION, CARDIAC ASSIST DEVICE, IMPELLA;  Surgeon: Kerrin Elspeth BROCKS, MD;  Location: [facility] OR;  Service: Open Heart Surgery;  Laterality: N/A;  Impella 5.5 insertion   RIGHT HEART CATH N/A 09/01/2024   Procedure: RIGHT HEART CATH;  Surgeon: Cherrie Toribio SAUNDERS, MD;  Location: [facility] INVASIVE CV LAB;  Service: Cardiovascular;  Laterality: N/A;   RIGHT/LEFT HEART CATH AND CORONARY ANGIOGRAPHY Bilateral 08/31/2023   Procedure: RIGHT/LEFT HEART CATH AND CORONARY ANGIOGRAPHY;  Surgeon: Mady Bruckner, MD;  Location: [facility] INVASIVE CV LAB;  Service: Cardiovascular;  Laterality: Bilateral;   TEE WITHOUT CARDIOVERSION N/A 09/05/2024   Procedure: ECHOCARDIOGRAM, TRANSESOPHAGEAL;  Surgeon: Kerrin Elspeth BROCKS, MD;  Location: [facility] OR;  Service: Open Heart Surgery;  Laterality: N/A;     Social History:   reports that he quit smoking about 58 years ago. His smoking use included cigarettes. He has a 0.3 pack-year smoking history. He has never used smokeless tobacco. He reports that he does not currently use alcohol. He reports that he does not use drugs.   Family History:  His family history includes Cancer in his mother; Colon cancer in his mother; Colon polyps in his mother; Heart disease in his father; Heart disease (age of onset: 39) in his brother; Hyperlipidemia in his father; Hypertension in his mother. There is no history of  Prostate cancer.   Allergies No Known Allergies   Home Medications  Prior to Admission medications   Medication Sig Start Date End Date Taking? Authorizing Provider  albuterol  (VENTOLIN  HFA) 108 (90 Base) MCG/ACT inhaler Inhale 2 puffs into the lungs every 4 (four) hours as needed for wheezing or shortness of breath. 08/27/24  Yes Graham, Laura E, PA-C  amoxicillin -clavulanate (AUGMENTIN ) 875-125 MG tablet Take 1 tablet by mouth 2 (two) times daily.   Yes [provider]  apixaban  (ELIQUIS ) 5 MG TABS tablet Take 1 tablet by mouth twice daily 08/16/24  Yes End, Bruckner, MD  carvedilol  (COREG ) 25 MG tablet TAKE 1 TABLET BY MOUTH TWICE DAILY WITH A MEAL 06/12/24  Yes End, Bruckner, MD  digoxin  (LANOXIN ) 0.125 MG tablet Take 0.125 mg by mouth daily.   Yes [provider]  doxazosin  (CARDURA ) 4  MG tablet Take 1 tablet (4 mg total) by mouth at bedtime. 01/06/24  Yes Cleatus Arlyss RAMAN, MD  empagliflozin  (JARDIANCE ) 10 MG TABS tablet Take 1 tablet (10 mg total) by mouth daily before breakfast. 10/04/23  Yes End, Lonni, MD  ENTRESTO  24-26 MG Take 1 tablet by mouth 2 (two) times daily. 12/12/18  Yes Cleatus Arlyss RAMAN, MD  fish oil-omega-3 fatty acids 1000 MG capsule Take 1 g by mouth daily.   Yes [provider]  fluticasone -salmeterol (ADVAIR) 250-50 MCG/ACT AEPB INHALE 1 DOSE BY MOUTH IN THE MORNING AND AT BEDTIME RINSE MOUTH AFTER USE 08/20/24  Yes Cleatus Arlyss RAMAN, MD  furosemide  (LASIX ) 20 MG tablet Take 1-2 tablets (20-40 mg total) by mouth daily. Patient taking differently: Take 40 mg by mouth 3 (three) times a week. 01/06/24  Yes Cleatus Arlyss RAMAN, MD  furosemide  (LASIX ) 40 MG tablet Take 40 mg by mouth daily. 05/08/24  Yes [provider]  levothyroxine  (SYNTHROID ) 137 MCG tablet Take 1 tablet (137 mcg total) by mouth daily before breakfast. 01/06/24  Yes Cleatus Arlyss RAMAN, MD  Melatonin 5 MG CAPS Take 5 mg by mouth daily.  12/18/14  Yes [provider]  Multiple Vitamin (MULTIVITAMIN) tablet Take 1 tablet by mouth daily.   Yes [provider]  pravastatin  (PRAVACHOL ) 40 MG tablet Take 1 tablet (40 mg total) by mouth daily. Patient taking differently: Take 40 mg by mouth at bedtime. 01/06/24  Yes Cleatus Arlyss RAMAN, MD  sildenafil  (VIAGRA ) 100 MG tablet Take 1 tablet (100 mg total) by mouth daily as needed for erectile dysfunction. 03/01/24  Yes Cleatus Arlyss RAMAN, MD  spironolactone  (ALDACTONE ) 25 MG tablet Take 1 tablet (25 mg total) by mouth daily. 01/06/24  Yes Cleatus Arlyss RAMAN, MD     Critical care time:     Libby Blanch, DO Internal Medicine Resident PGY-3   Please see [email or web address] for pager details.  From 7A-7P if no response, please call [phone] After hours, please call ELink [phone]

## 2024-09-07 NOTE — TOC Progression Note (Signed)
 Transition of Care [facility]) - Progression Note    Patient Details  Name: [name] [name] MRN: [id number] Date of Birth: Jan 20, 1946  Transition of Care [facility]) CM/SW Contact  Justina Delcia Czar, RN Phone Number: [phone] 09/07/2024, 3:06 PM  Clinical Narrative:     Patient was independent from home with wife. Has RW and cane at home. Wife will transport home at dc. LVAD workup ongoing. Remain on Milrinone  and amiodarone  gtt.    Chart reviewed for discharge readiness, patient not medically stable for d/c. Inpatient CM/CSW will continue to monitor pt's advancement through interdisciplinary progression rounds.    If new pt transition needs arise, MD please place a TOC consult.    Expected Discharge Plan: Home w Home Health Services Barriers to Discharge: Continued Medical Work up    Expected Discharge Plan and Services   Discharge Planning Services: CM Consult                     Social Drivers of Health (SDOH) Interventions SDOH Screenings   Food Insecurity: No Food Insecurity (08/28/2024)  Housing: Low Risk (08/28/2024)  Transportation Needs: No Transportation Needs (08/28/2024)  Utilities: Not At Risk (08/28/2024)  Alcohol Screen: Low Risk (01/04/2024)  Depression (PHQ2-9): Low Risk (01/06/2024)  Financial Resource Strain: Low Risk (01/04/2024)  Physical Activity: Inactive (01/04/2024)  Social Connections: Moderately Integrated (08/28/2024)  Stress: No Stress Concern Present (01/04/2024)  Tobacco Use: Medium Risk (09/05/2024)  Health Literacy: Adequate Health Literacy (01/04/2024)    Readmission Risk Interventions    08/28/2024    4:12 PM  Readmission Risk Prevention Plan  Transportation Screening Complete  PCP or Specialist Appt within 5-7 Days Complete  Home Care Screening Complete  Medication Review (RN CM) Complete

## 2024-09-07 NOTE — Progress Notes (Addendum)
 Patient ID: [name] [name], male   DOB: April 20, 1946, 78 y.o.   MRN: [id number]     Advanced Heart Failure Rounding Note  Cardiologist: [name], MD  AHF: Dr. Zenaida  Chief Complaint: A/c Systolic HF Patient Profile   78 y/o male w/ chronic systolic heart failure w/ biventricular dysfunction d/t mixed ischemic/nonischemic CM, CAD, severe functional MR and permanent atrial fibrillation admitted w/ a/c CHF w/ NYHA Class IV symptoms. Now w/ low output. Initial Co-ox 47%. Started on Milrinone .   Subjective:    12/9 S/P Impella 5.5 Placement . CVP low. No diuretics given.   Continues on milrinone  0.25 mcg + amio 60 mg/min.  He is in rate-controlled atrial fibrillation 90s-100s. Co-ox 67%, CVP 9. Creatinine down to 1.87.   Impella 5.5 Flow (Liters/min): 4.5 Liters/min Performance Level: P7 LDH 126 Plts 146  On Zosyn  for prostatitis.    He has had nausea since Sunday, taking Zofran  regularly.  Not eating much but he is drinking Ensure.     Objective:    Weight Range: 74.7 kg Body mass index is 23.63 kg/m.   Vital Signs:   Temp:  [97.9 F (36.6 C)-99.7 F (37.6 C)] 99.3 F (37.4 C) (12/11 0600) Pulse Rate:  [47-147] 77 (12/11 0700) Resp:  [13-27] 16 (12/11 0700) BP: (99-141)/(67-103) 130/97 (12/11 0700) SpO2:  [88 %-98 %] 90 % (12/11 0700) Weight:  [74.7 kg] 74.7 kg (12/11 0500) Last BM Date : 09/04/24  Weight change: Filed Weights   09/05/24 0654 09/06/24 0500 09/07/24 0500  Weight: 73.1 kg 75 kg 74.7 kg   Intake/Output:  Intake/Output Summary (Last 24 hours) at 09/07/2024 0743 Last data filed at 09/07/2024 0700 Gross per 24 hour  Intake 1488.29 ml  Output 1700 ml  Net -211.71 ml    Physical Exam   General: NAD Neck: JVP 8-9 cm, no thyromegaly or thyroid  nodule.  Lungs: Clear to auscultation bilaterally with normal respiratory effort. CV: Nondisplaced PMI.  Heart regular S1/S2, no S3/S4, no murmur.  No peripheral edema.    Abdomen: Soft, nontender, no  hepatosplenomegaly, no distention.  Skin: Intact without lesions or rashes.  Neurologic: Alert and oriented x 3.  Psych: Normal affect. Extremities: No clubbing or cyanosis.  HEENT: Normal.    Telemetry   A fib 90s-100s.  Labs    CBC Recent Labs    09/06/24 0430 09/06/24 0431 09/07/24 0420  WBC 15.8*  --  10.7*  NEUTROABS 13.1*  --  8.6*  HGB 11.5* 11.2* 10.8*  HCT 31.6* 33.0* 30.5*  MCV 90.5  --  91.9  PLT 147*  --  146*   Basic Metabolic Panel Recent Labs    87/90/74 1036 09/05/24 1055 09/06/24 0430 09/06/24 0431 09/07/24 0420  NA 129*   < > 128* 127* 128*  K 3.4*   < > 3.9 4.1 3.2*  CL 92*   < > 92*  --  91*  CO2 25   < > 26  --  30  GLUCOSE 156*   < > 125*  --  152*  BUN 50*   < > 46*  --  30*  CREATININE 2.34*   < > 2.45*  --  1.87*  CALCIUM 8.1*   < > 8.3*  --  8.0*  MG 1.9  --   --   --  2.1  PHOS 3.8  --   --   --   --    < > = values in this interval not displayed.  Liver Function Tests Recent Labs    09/06/24 0430 09/07/24 0420  AST 13* 13*  ALT 12 8  ALKPHOS 49 48  BILITOT 1.6* 1.5*  PROT 6.0* 5.8*  ALBUMIN 3.2* 2.9*   BNP (last 3 results) Recent Labs    08/27/24 1907 08/28/24 0204  BNP 1,633.9* 1,952.5*   Medications:    Scheduled Medications:  arformoterol   15 mcg Nebulization BID   Chlorhexidine  Gluconate Cloth  6 each Topical Daily   feeding supplement  237 mL Oral BID BM   furosemide   40 mg Intravenous Once   levothyroxine   137 mcg Oral QAC breakfast   pantoprazole  (PROTONIX ) IV  40 mg Intravenous QHS   polyethylene glycol  17 g Oral Daily   potassium chloride   40 mEq Oral BID   pravastatin   40 mg Oral QHS   revefenacin   175 mcg Nebulization Daily   senna-docusate  2 tablet Oral QHS   sodium chloride  flush  10-40 mL Intracatheter Q12H     Infusions:  amiodarone  60 mg/hr (09/07/24 0700)   heparin  700 Units/hr (09/07/24 0700)   milrinone  0.25 mcg/kg/min (09/07/24 0700)   piperacillin -tazobactam (ZOSYN )  IV 12.5  mL/hr at 09/07/24 0700   sodium bicarbonate  25 mEq (Impella PURGE) in dextrose  5 % 1000 mL bag      PRN Medications: sodium chloride , acetaminophen  **OR** acetaminophen  (TYLENOL ) oral liquid 160 mg/5 mL **OR** acetaminophen , docusate sodium , fentaNYL  (SUBLIMAZE ) injection, melatonin, ondansetron  (ZOFRAN ) IV, oxyCODONE , polyethylene glycol, sodium chloride  flush  Assessment/Plan   1. Acute on Chronic Systolic Heart Failure w/ Low Output  - mixed ischemic/idiopathic CM  - Echo 2/25: LVEF 25 to 30%, moderate to severely dilated, mildly reduced RV function - Echo 12/1 EF <20%, RV not well visualized  - RHC 12/5 (on milrinone  0.25): Well compensated. mildly elevated L sided filling and PA pressures with mildly reduced CO by TD 4.3/2.3 - Initial co-ox 47%. Started on milrinone  0.25. CO-OX remained suboptimal. Escalation of care today --> MCS Impella 5.5 placed.  - Taken to OR for Impella 5.5 .  - Impella at P7 Flow 4.6. LDH and platelets stable.  - Echo 12/10, Impella advanced.   - Continue milrinone  0.25 mcg/kg/min, co-ox stable at 67%.   - CVP 9, will give Lasix  40 mg IV x 1.  Replace K.  - off digoxin  given elevated level and AKI. - other GDMT limited by renal function - Discussed at [facility], limited by renal dysfunction.  Creatinine lower at 1.87 today.  Working towards LVAD.  - Ambulate today.   2. Permanent Afib  - With nausea, decrease amiodarone  to 30 mg/hr. Using for rate control.  - Continue heparin  gtt.    3. Severe MR - functional  - LV cavity more dilated on this admission echo in setting of a/c CHF exacerbation, LVIDs 7.7 cm (6.40 cm)   4. VT/NSVT - in setting of worsening HF, 17 beat run tele 12/2 - increased frequency of NSVT runs  - keep K > 4.0 and Mg > 2.0 - Continue IV amiodarone  but decrease dose to  30 mg/hr.  - he previously declined ICD.    5. CAD - [facility] 08/31/2023: LMCA with 20% distal stenosis. LAD with sequential 50% proximal and mid lesions. LCx with 30% mid  stenosis, and proximal/mid RCA with 30% disease and 70% stenosis at the ostium of AM1  - continue statin - Has not been on ASA given need for anticoagulation.   6. AKI - b/l SCr 1.4 - bumped  to 2.6-->2.5 - Creatinine down to 1.87 this morning.   7. Hypokalemia - Replace K.   8. H/o Malignant Melanoma - occipital scalp, s/p excision @ [facility] in 2023 - never followed up for surveillance head CTs  - CT head reassuring  9. Prostatitis - Possible prostate abscess.  - He is on Zosyn .   10. Nausea - Began Sunday, so not likely to be related to Zosyn  that started yesterday.  - Decrease amiodarone  rate to see if this helps.  - Zofran  - Continue Ensure.   Length of Stay: 10  Advanced Heart Failure Team Pager [phone] (M-F; 7a - 5p)  Please contact [facility] for night-coverage after hours (5p -7a ) and weekends on [email or web address]  CRITICAL CARE Performed by: [name]  Total critical care time: 45 minutes  Critical care time was exclusive of separately billable procedures and treating other patients.  Critical care was necessary to treat or prevent imminent or life-threatening deterioration.  Critical care was time spent personally by me on the following activities: development of treatment plan with patient and/or surrogate as well as nursing, discussions with consultants, evaluation of patient's response to treatment, examination of patient, obtaining history from patient or surrogate, ordering and performing treatments and interventions, ordering and review of laboratory studies, ordering and review of radiographic studies, pulse oximetry and re-evaluation of patient's condition.  [name] 09/07/2024 7:43 AM

## 2024-09-07 NOTE — Progress Notes (Signed)
 PHARMACY - ANTICOAGULATION CONSULT NOTE  Pharmacy Consult for IV heparin  Indication: afib/ Impella 5.5  No Known Allergies  Patient Measurements: Height: 5' 10 (177.8 cm) Weight: 74.7 kg (164 lb 10.9 oz) IBW/kg (Calculated) : 73 HEPARIN  DW (KG): 73.1  Vital Signs: Temp: 97.2 F (36.2 C) (12/11 0751) Temp Source: Axillary (12/11 0751) BP: 118/101 (12/11 1100) Pulse Rate: 75 (12/11 1100)  Labs: Recent Labs    09/04/24 1802 09/05/24 0811 09/05/24 1036 09/05/24 1055 09/05/24 1637 09/06/24 0430 09/06/24 0431 09/06/24 1752 09/07/24 0420  HGB  --    < > 11.3*   < >  --  11.5* 11.2*  --  10.8*  HCT  --    < > 30.8*   < >  --  31.6* 33.0*  --  30.5*  PLT  --   --  144*  --   --  147*  --   --  146*  APTT 68*  --   --   --   --   --   --  52* 52*  HEPARINUNFRC >1.10*  --   --   --   --   --   --  >1.10* 1.08*  CREATININE  --    < > 2.34*  --  2.39* 2.45*  --   --  1.87*   < > = values in this interval not displayed.    Estimated Creatinine Clearance: 33.6 mL/min (A) (by C-G formula based on SCr of 1.87 mg/dL (H)).   Medical History: Past Medical History:  Diagnosis Date   Atrial fibrillation (HCC)    CHF (congestive heart failure) (HCC)    Cirrhosis (HCC)    ED (erectile dysfunction)    Hyperglycemia    Hyperlipidemia    Hypertension    Internal hemorrhoids    Myocardial infarction (HCC) 2008   SCCA (squamous cell carcinoma) of skin    Left ear.  MOHS surgery 2014 [facility]   Skin cancer of face    2012 basal cell   Thyroid  disease     Medications:  Infusions:   amiodarone  30 mg/hr (09/07/24 1100)   heparin  700 Units/hr (09/07/24 1100)   milrinone  0.25 mcg/kg/min (09/07/24 1100)   piperacillin -tazobactam (ZOSYN )  IV 12.5 mL/hr at 09/07/24 1100   sodium bicarbonate  25 mEq (Impella PURGE) in dextrose  5 % 1000 mL bag      Assessment: 78 yo male on apixaban  PTA for afib (last dose 12/7).  Switched to heparin  drip briefly, then held for Impella 5.5  placement.  Pharmacy asked to resume IV heparin  today.  Previously had therapeutic aPTT on 950 units/hr.  Heparin  level supratherapeutic and not correlating - with recent apixaban  use - dose heparin  with aptt Heparin  drip 700 uts/hr aith aptt 55sec CBC stable - no over bleeding noted Patient's urine is dark orange tinged - has been this way - stable   Goal of Therapy:  Heparin  level 0.3-0.5 APTT 66-85 Monitor platelets by anticoagulation protocol: Yes   Plan:  Increase IV heparin  without bolus to rate of 800 units/hr.   Daily heparin  level, aPTT and CBC. Per discussion with Dr. Rolan, okay for pharmacy to titrate slowly to goal. Monitor s/s bleeding     Olam Chalk Pharm.D. CPP, BCPS Clinical Pharmacist [phone] 09/07/2024 12:02 PM   **Pharmacist phone directory can now be found on [email or web address] ([email or web address]).  Listed under [facility].

## 2024-09-08 DIAGNOSIS — I4821 Permanent atrial fibrillation: Secondary | ICD-10-CM | POA: Diagnosis not present

## 2024-09-08 DIAGNOSIS — I5023 Acute on chronic systolic (congestive) heart failure: Secondary | ICD-10-CM | POA: Diagnosis not present

## 2024-09-08 DIAGNOSIS — I340 Nonrheumatic mitral (valve) insufficiency: Secondary | ICD-10-CM | POA: Diagnosis not present

## 2024-09-08 DIAGNOSIS — R570 Cardiogenic shock: Secondary | ICD-10-CM | POA: Diagnosis not present

## 2024-09-08 LAB — COMPREHENSIVE METABOLIC PANEL WITH GFR
ALT: 11 U/L (ref 0–44)
AST: 14 U/L — ABNORMAL LOW (ref 15–41)
Albumin: 2.8 g/dL — ABNORMAL LOW (ref 3.5–5.0)
Alkaline Phosphatase: 58 U/L (ref 38–126)
Anion gap: 9 (ref 5–15)
BUN: 28 mg/dL — ABNORMAL HIGH (ref 8–23)
CO2: 28 mmol/L (ref 22–32)
Calcium: 7.8 mg/dL — ABNORMAL LOW (ref 8.9–10.3)
Chloride: 93 mmol/L — ABNORMAL LOW (ref 98–111)
Creatinine, Ser: 2.02 mg/dL — ABNORMAL HIGH (ref 0.61–1.24)
GFR, Estimated: 33 mL/min — ABNORMAL LOW (ref >60)
Glucose, Bld: 100 mg/dL — ABNORMAL HIGH (ref 70–99)
Potassium: 3.7 mmol/L (ref 3.5–5.1)
Sodium: 130 mmol/L — ABNORMAL LOW (ref 135–145)
Total Bilirubin: 1.3 mg/dL — ABNORMAL HIGH (ref 0.0–1.2)
Total Protein: 5.6 g/dL — ABNORMAL LOW (ref 6.5–8.1)

## 2024-09-08 LAB — COOXEMETRY PANEL
Carboxyhemoglobin: 1.3 % (ref 0.5–1.5)
Methemoglobin: 1.7 % — ABNORMAL HIGH (ref 0.0–1.5)
O2 Saturation: 76 %
Total hemoglobin: 11.1 g/dL — ABNORMAL LOW (ref 12.0–16.0)

## 2024-09-08 LAB — CBC
HCT: 29.1 % — ABNORMAL LOW (ref 39.0–52.0)
Hemoglobin: 10.6 g/dL — ABNORMAL LOW (ref 13.0–17.0)
MCH: 33.2 pg (ref 26.0–34.0)
MCHC: 36.4 g/dL — ABNORMAL HIGH (ref 30.0–36.0)
MCV: 91.2 fL (ref 80.0–100.0)
Platelets: 143 K/uL — ABNORMAL LOW (ref 150–400)
RBC: 3.19 MIL/uL — ABNORMAL LOW (ref 4.22–5.81)
RDW: 12.5 % (ref 11.5–15.5)
WBC: 10.2 K/uL (ref 4.0–10.5)
nRBC: 0 % (ref 0.0–0.2)

## 2024-09-08 LAB — HEPARIN LEVEL (UNFRACTIONATED): Heparin Unfractionated: 0.58 [IU]/mL (ref 0.30–0.70)

## 2024-09-08 LAB — APTT: aPTT: 49 s — ABNORMAL HIGH (ref 24–36)

## 2024-09-08 LAB — LACTATE DEHYDROGENASE: LDH: 144 U/L (ref 105–235)

## 2024-09-08 LAB — MAGNESIUM: Magnesium: 1.9 mg/dL (ref 1.7–2.4)

## 2024-09-08 MED ORDER — POTASSIUM CHLORIDE CRYS ER 10 MEQ PO TBCR
30.0000 meq | EXTENDED_RELEASE_TABLET | Freq: Once | ORAL | Status: AC
  Administered 2024-09-08: 30 meq via ORAL
  Filled 2024-09-08: qty 3

## 2024-09-08 MED ORDER — MAGNESIUM SULFATE 2 GM/50ML IV SOLN
2.0000 g | Freq: Once | INTRAVENOUS | Status: AC
  Administered 2024-09-08: 2 g via INTRAVENOUS
  Filled 2024-09-08: qty 50

## 2024-09-08 MED ORDER — ENSURE PLUS HIGH PROTEIN PO LIQD
237.0000 mL | Freq: Three times a day (TID) | ORAL | Status: DC
  Administered 2024-09-08 – 2024-09-11 (×9): 237 mL via ORAL

## 2024-09-08 NOTE — Progress Notes (Signed)
° °  3 Days Post-Op Subjective: Patient up in chair in good spirits, visiting with family.  Case and plan was reviewed with all parties.  Urine has nearly cleared  Objective: Vital signs in last 24 hours: Temp:  [92.7 F (33.7 C)-99.7 F (37.6 C)] 98.6 F (37 C) (12/12 1100) Pulse Rate:  [68-117] 90 (12/12 1200) Resp:  [12-26] 18 (12/12 1200) BP: (102-133)/(71-100) 132/97 (12/12 0700) SpO2:  [86 %-96 %] 91 % (12/12 1200) Weight:  [75.7 kg] 75.7 kg (12/12 0500)  Assessment/Plan: 57y male with severe heart failure requiring LVAD.  Incidental finding of prostate mass during workup on chronic anticoagulation.  # Prostate mass  Incidental finding during CT imaging before LVAD.  Asymptomatic.  Not immediately clear if this represents abscess or malignancy.  Patient is unable to stop anticoagulation.  Treating as infectious with plan for repeat imaging on or around 12/22.  If no improvement we will monitor closely and try to find a window for safe anticoagulation holiday and biopsy once patient has HeartMate.  # Gross hematuria Foley catheter was placed in OR, balloon deployed in prostate while on anticoagulation.  Resultant hematuria has largely cleared.  No acute indication for CBI  Intake/Output from previous day: 12/11 0701 - 12/12 0700 In: 1395.2 [P.O.:240; I.V.:726.2; IV Piggyback:192] Out: 2280 [Urine:2280]  Intake/Output this shift: Total I/O In: 504.1 [P.O.:240; I.V.:150.1; Other:28.9; IV Piggyback:85.1] Out: 100 [Urine:100]  Physical Exam:  General: Alert and oriented CV: No cyanosis Lungs: equal chest rise Gu: Foley catheter in place draining lightly blood-tinged urine  Lab Results: Recent Labs    09/06/24 0431 09/07/24 0420 09/08/24 0507  HGB 11.2* 10.8* 10.6*  HCT 33.0* 30.5* 29.1*   BMET Recent Labs    09/07/24 0420 09/07/24 1545 09/08/24 0507  NA 128* 132* 130*  K 3.2* 3.6 3.7  CL 91* 90* 93*  CO2 30 29 28   GLUCOSE 152* 128* 100*  BUN 30* 31* 28*   CREATININE 1.87* 2.03* 2.02*  CALCIUM 8.0* 8.2* 7.8*  HGB 10.8*  --  10.6*  WBC 10.7*  --  10.2     Studies/Results: No results found.    LOS: 11 days   Ole Bourdon, NP [facility] Pager: [phone]  09/08/2024, 1:30 PM

## 2024-09-08 NOTE — Progress Notes (Addendum)
 Patient ID: [name] [name], male   DOB: March 11, 1946, 78 y.o.   MRN: [id number]     Advanced Heart Failure Rounding Note  Cardiologist: [name], MD  AHF: Dr. Zenaida  Chief Complaint: A/c Systolic HF Patient Profile   78 y/o male w/ chronic systolic heart failure w/ biventricular dysfunction d/t mixed ischemic/nonischemic CM, CAD, severe functional MR and permanent atrial fibrillation admitted w/ a/c CHF w/ NYHA Class IV symptoms. Now w/ low output. Initial Co-ox 47%. Started on Milrinone .   Subjective:    12/9 S/P Impella 5.5 Placement. CVP low. No diuretics given.   Continues on milrinone  0.25 mcg + amio 30 mg/min.  He is in rate-controlled atrial fibrillation 90s-100s. Co-ox 76%, CVP 5/6. Creatinine 2.02 today.   Impella 5.5 Flow (Liters/min): 4.6 Liters/min Performance Level: P7 LDH 144 Plts 143  On Zosyn  for prostatitis.    Already walked around the unit this morning. Nausea much better today. Denies CP/SOB.   Objective:    Weight Range: 75.7 kg Body mass index is 23.95 kg/m.   Vital Signs:   Temp:  [92.7 F (33.7 C)-99.7 F (37.6 C)] 98.6 F (37 C) (12/12 1000) Pulse Rate:  [65-117] 91 (12/12 1000) Resp:  [12-28] 12 (12/12 1000) BP: (102-133)/(71-108) 132/97 (12/12 0700) SpO2:  [86 %-96 %] 92 % (12/12 1000) Weight:  [75.7 kg] 75.7 kg (12/12 0500) Last BM Date : 09/07/24  Weight change: Filed Weights   09/06/24 0500 09/07/24 0500 09/08/24 0500  Weight: 75 kg 74.7 kg 75.7 kg   Intake/Output:  Intake/Output Summary (Last 24 hours) at 09/08/2024 1002 Last data filed at 09/08/2024 0900 Gross per 24 hour  Intake 1240.96 ml  Output 2195 ml  Net -954.04 ml    Physical Exam  General:  elderly appearing, sitting up in chair.  No respiratory difficulty Neck: JVD ~6 cm.  Cor: Regular rate & irregular rhythm. +impella, site stable.  Lungs: clear Extremities: no edema, + TED hose. PICC RUE GU: + foley Neuro: alert & oriented x 3. Affect pleasant.    Telemetry    A fib 80s-90s (Personally reviewed)    Labs    CBC Recent Labs    09/06/24 0430 09/06/24 0431 09/07/24 0420 09/08/24 0507  WBC 15.8*  --  10.7* 10.2  NEUTROABS 13.1*  --  8.6*  --   HGB 11.5*   < > 10.8* 10.6*  HCT 31.6*   < > 30.5* 29.1*  MCV 90.5  --  91.9 91.2  PLT 147*  --  146* 143*   < > = values in this interval not displayed.   Basic Metabolic Panel Recent Labs    87/90/74 1036 09/05/24 1055 09/07/24 0420 09/07/24 1545 09/08/24 0507  NA 129*   < > 128* 132* 130*  K 3.4*   < > 3.2* 3.6 3.7  CL 92*   < > 91* 90* 93*  CO2 25   < > 30 29 28   GLUCOSE 156*   < > 152* 128* 100*  BUN 50*   < > 30* 31* 28*  CREATININE 2.34*   < > 1.87* 2.03* 2.02*  CALCIUM 8.1*   < > 8.0* 8.2* 7.8*  MG 1.9  --  2.1  --  1.9  PHOS 3.8  --   --   --   --    < > = values in this interval not displayed.   Liver Function Tests Recent Labs    09/07/24 0420 09/08/24 0507  AST 13*  14*  ALT 8 11  ALKPHOS 48 58  BILITOT 1.5* 1.3*  PROT 5.8* 5.6*  ALBUMIN 2.9* 2.8*   BNP (last 3 results) Recent Labs    08/27/24 1907 08/28/24 0204  BNP 1,633.9* 1,952.5*   Medications:    Scheduled Medications:  arformoterol   15 mcg Nebulization BID   Chlorhexidine  Gluconate Cloth  6 each Topical Daily   feeding supplement  237 mL Oral BID BM   levothyroxine   137 mcg Oral QAC breakfast   polyethylene glycol  17 g Oral Daily   pravastatin   40 mg Oral QHS   revefenacin   175 mcg Nebulization Daily   senna-docusate  2 tablet Oral QHS   sodium chloride  flush  10-40 mL Intracatheter Q12H     Infusions:  amiodarone  30 mg/hr (09/08/24 0700)   heparin  800 Units/hr (09/08/24 0700)   milrinone  0.25 mcg/kg/min (09/08/24 0700)   piperacillin -tazobactam (ZOSYN )  IV 12.5 mL/hr at 09/08/24 0700   sodium bicarbonate  25 mEq (Impella PURGE) in dextrose  5 % 1000 mL bag      PRN Medications: sodium chloride , acetaminophen  **OR** acetaminophen  (TYLENOL ) oral liquid 160 mg/5 mL **OR**  acetaminophen , docusate sodium , fentaNYL  (SUBLIMAZE ) injection, melatonin, ondansetron  (ZOFRAN ) IV, oxyCODONE , polyethylene glycol, sodium chloride  flush  Assessment/Plan   1. Acute on Chronic Systolic Heart Failure w/ Low Output  - mixed ischemic/idiopathic CM  - Echo 2/25: LVEF 25 to 30%, moderate to severely dilated, mildly reduced RV function - Echo 12/1 EF <20%, RV not well visualized  - RHC 12/5 (on milrinone  0.25): Well compensated. mildly elevated L sided filling and PA pressures with mildly reduced CO by TD 4.3/2.3 - Initial co-ox 47%. Started on milrinone  0.25. CO-OX remained suboptimal. Escalation of care 12/9--> MCS Impella 5.5 placed.  - Impella at P7 Flow 4.6. LDH and platelets stable.  - Echo 12/10, Impella advanced.   - Continue milrinone  0.25 mcg/kg/min, co-ox stable at 76%.   - CVP 5/6. Would hold off on additional diuretics today.  - off digoxin  given elevated level and AKI. - other GDMT limited by renal function - Discussed at [facility], limited by renal dysfunction. Creatinine 2.02 today. Working towards LVAD.  - Continue to ambulate.   2. Permanent Afib  - Continue amiodarone  30 mg/hr. Using for rate control.  - Continue heparin  gtt.    3. Severe MR - functional  - LV cavity more dilated on this admission echo in setting of a/c CHF exacerbation, LVIDs 7.7 cm (6.40 cm)   4. VT/NSVT - in setting of worsening HF, 17 beat run tele 12/2 - increased frequency of NSVT runs  - keep K > 4.0 and Mg > 2.0 - Continue IV amiodarone  at 30 mg/hr.  - he previously declined ICD.    5. CAD - LHC 08/31/2023: LMCA with 20% distal stenosis. LAD with sequential 50% proximal and mid lesions. LCx with 30% mid stenosis, and proximal/mid RCA with 30% disease and 70% stenosis at the ostium of AM1  - continue statin - Has not been on ASA given need for anticoagulation.   6. AKI - b/l SCr 1.4 - bumped to 2.6-->2.5 - Creatinine 2.02 today  7. Hypokalemia - Replace K.   8. H/o  Malignant Melanoma - occipital scalp, s/p excision @ [facility] in 2023 - Never followed up for surveillance head CTs  - CT head reassuring  9. Prostatitis - Possible prostate abscess.  - He is on Zosyn .   10. Nausea - Began Sunday, so not likely to be related  to Zosyn  that started 12/10.  - Amiodarone  decreased 12/11 - Zofran  - Continue Ensure.  - Much improved today.   Length of Stay: 11  Advanced Heart Failure Team Pager [phone] (M-F; 7a - 5p)  Please contact [facility] for night-coverage after hours (5p -7a ) and weekends on [email or web address]  CRITICAL CARE Performed by: Beckey LITTIE Coe  Total critical care time: 14 minutes  Critical care time was exclusive of separately billable procedures and treating other patients.  Critical care was necessary to treat or prevent imminent or life-threatening deterioration.  Critical care was time spent personally by me on the following activities: development of treatment plan with patient and/or surrogate as well as nursing, discussions with consultants, evaluation of patient's response to treatment, examination of patient, obtaining history from patient or surrogate, ordering and performing treatments and interventions, ordering and review of laboratory studies, ordering and review of radiographic studies, pulse oximetry and re-evaluation of patient's condition.   Beckey LITTIE Coe 09/08/2024 10:02 AM  Patient seen with NP, I formulated the plan and agree with the above note.   CVP 5-6 today, co-ox 76%.  Impella at P7, flow 4.6. Nausea resolved, he thinks it was related to decreasing amiodarone  dose.  Has been walking in halls  Got Lasix  IV x 1 yesterday, I/Os net negative 886.  Creatinine stable 2.02.   General: NAD Neck: No JVD, no thyromegaly or thyroid  nodule.  Lungs: Clear to auscultation bilaterally with normal respiratory effort. CV: Nondisplaced PMI.  Heart regular S1/S2, no S3/S4, no murmur.  No peripheral edema.    Abdomen: Soft,  nontender, no hepatosplenomegaly, no distention.  Skin: Intact without lesions or rashes.  Neurologic: Alert and oriented x 3.  Psych: Normal affect. Extremities: No clubbing or cyanosis.  HEENT: Normal.   Feels better today with resolution of nausea.  Will continue lower dose of amiodarone  for suppression of NSVT.    Hemodynamically stable on milrinone  0.25 and Impella 5.5 at P7.  Will continue this regimen.  CVP 5-6, no Lasix  today.  Creatinine 2.02.  We are working towards LVAD placement but would like to see improvement in creatinine.  Will watch over weekend and reassess Monday.  Needs to walk in halls regularly, encourage po intake.   CRITICAL CARE Performed by: [name]  Total critical care time: 35 minutes  Critical care time was exclusive of separately billable procedures and treating other patients.  Critical care was necessary to treat or prevent imminent or life-threatening deterioration.  Critical care was time spent personally by me on the following activities: development of treatment plan with patient and/or surrogate as well as nursing, discussions with consultants, evaluation of patient's response to treatment, examination of patient, obtaining history from patient or surrogate, ordering and performing treatments and interventions, ordering and review of laboratory studies, ordering and review of radiographic studies, pulse oximetry and re-evaluation of patient's condition.  [name] 09/08/2024 11:08 AM

## 2024-09-08 NOTE — Evaluation (Signed)
 Occupational Therapy Evaluation Patient Details Name: [name] [name] MRN: [id number] DOB: 1946/08/17 Today's Date: 09/08/2024   History of Present Illness   78 yo male admitted 11/30 with SOB Pt with workup for PNA and acute CHF exacerbation. 12/5 RHC revealed biventricular heart failure. 12/9 Impella insertion, plan for LVAD. PMH CHF, a-fib on Eliquis , HTN, Hypothyroidism, MI,SCCA, cirrhosis     Clinical Impressions OT re-consulted, after pt s/p Impella insertion.  PTA pt was independent with ADLs and IADLs. Today pt is mod I for ADLs and mobility. Completed LB dressing and standing ADLs at mod I. Mobilized in hallway, with max HR 122 bpm. Verbally reviewed energy conservation strategies with pt to reinforce previous education. No follow up OT needs. OT is signing off on this pt. Please re-consult as needed.    If plan is discharge home, recommend the following:   Assist for transportation    Functional Status Assessment   Patient has not had a recent decline in their functional status     Equipment Recommendations   None recommended by OT      Precautions/Restrictions   Precautions Precautions: Fall Recall of Precautions/Restrictions: Intact Precaution/Restrictions Comments: Impella, watch HR Restrictions Weight Bearing Restrictions Per Provider Order: No     Mobility Bed Mobility   General bed mobility comments: Received in recliner    Transfers Overall transfer level: Needs assistance Equipment used: Rolling walker (2 wheels) Transfers: Sit to/from Stand Sit to Stand: Supervision           General transfer comment: S for safety with lines      Balance Overall balance assessment: Needs assistance Sitting-balance support: No upper extremity supported, Feet supported Sitting balance-Leahy Scale: Good     Standing balance support: No upper extremity supported Standing balance-Leahy Scale: Fair       ADL either performed or assessed with  clinical judgement   ADL Overall ADL's : Modified independent   General ADL Comments: Standing ADLs, LB dressing mod I with RW. Reviewed energy conservation strategies     Vision Baseline Vision/History: 1 Wears glasses Patient Visual Report: No change from baseline Vision Assessment?: No apparent visual deficits            Pertinent Vitals/Pain Pain Assessment Pain Assessment: No/denies pain     Extremity/Trunk Assessment Upper Extremity Assessment Upper Extremity Assessment: Overall WFL for tasks assessed   Lower Extremity Assessment Lower Extremity Assessment: Defer to PT evaluation   Cervical / Trunk Assessment Cervical / Trunk Assessment: Normal   Communication Communication Communication: No apparent difficulties   Cognition Arousal: Alert Behavior During Therapy: WFL for tasks assessed/performed Cognition: No apparent impairments   Following commands: Intact       Cueing  General Comments   Cueing Techniques: Verbal cues  Max Hr 122 BPM with activity           Home Living Family/patient expects to be discharged to:: Private residence Living Arrangements: Spouse/significant other Available Help at Discharge: Family;Available 24 hours/day Type of Home: House Home Access: Stairs to enter [employer] of Steps: 2 Entrance Stairs-Rails: None Home Layout: One level     Bathroom Shower/Tub: Walk-in shower;Tub/shower unit   Bathroom Toilet: Standard Bathroom Accessibility: Yes   Home Equipment: Shower seat          Prior Functioning/Environment Prior Level of Function : Independent/Modified Independent;Driving             Mobility Comments: Ind ADLs Comments: Ind    OT Problem List: Cardiopulmonary status limiting  activity        OT Goals(Current goals can be found in the care plan section)   Acute Rehab OT Goals Patient Stated Goal: To get better OT Goal Formulation: All assessment and education complete, DC  therapy Time For Goal Achievement: 09/22/24 Potential to Achieve Goals: Good   AM-PAC OT 6 Clicks Daily Activity     Outcome Measure Help from another person eating meals?: None Help from another person taking care of personal grooming?: None Help from another person toileting, which includes using toliet, bedpan, or urinal?: None Help from another person bathing (including washing, rinsing, drying)?: None Help from another person to put on and taking off regular upper body clothing?: None Help from another person to put on and taking off regular lower body clothing?: None 6 Click Score: 24   End of Session Equipment Utilized During Treatment: Gait belt;Rolling walker (2 wheels) Nurse Communication: Mobility status  Activity Tolerance: Patient tolerated treatment well Patient left: in chair;with call bell/phone within reach  OT Visit Diagnosis: Other (comment) (decreased activity tolerance)                Time: 8692-8670 OT Time Calculation (min): 22 min Charges:  OT General Charges $OT Visit: 1 Visit OT Evaluation $OT Eval Moderate Complexity: 1 Mod  Jem Castro C, OT  Acute Rehabilitation Services Office [phone] Secure chat preferred   Adrianne GORMAN Savers 09/08/2024, 1:40 PM

## 2024-09-08 NOTE — Progress Notes (Signed)
 Nutrition Follow-up  DOCUMENTATION CODES:   Not applicable  INTERVENTION:   Continue Regular diet; encourage small, frequent meals  Ensure Plus High Protein po TID, each supplement provides 350 kcal and 20 grams of protein   NUTRITION DIAGNOSIS:   Increased nutrient needs related to acute illness as evidenced by estimated needs.  Continues but being addressed  GOAL:   Patient will meet greater than or equal to 90% of their needs  Progressing  MONITOR:   PO intake, Supplement acceptance, Labs, Weight trends, I & O's  REASON FOR ASSESSMENT:   Consult LVAD Eval  ASSESSMENT:   Pt admitted with 2-3 days shortness of breath r/t acute on chronic heart failure. PMH significant for biventricular systolic heart failure, mixed ischemic/nonischemic CM, CAD, severe functional MR, paroxysmal afib, HTN, hypothyroidism.  11/30 Admitted 12/05 TCTS consult for LVAD work-up 12/09 Impella 5.5 placed, extubated  Impella currently at P7, 4.6 L/min flow Working towards LVAD per MD note, watching renal function at present Baseline Creatinine 1.4, currently 2.0  Remains on milrinone , heparin  and amiodarone  gtt  Pt up and ambulating well around the unit with RN. RD spoke to pt while he was ambulating.   Appetite relatively good, ate eggs and bacon at breakfast this AM. RN and pt note that at baseline, pt is a one meal a day kind of guy. Recorded po intake 0-75% of meals recorded.   Pt does reporting drinking several protein shakes at baseline and is currently drinking Ensure shakes well   +good BM yesterday and today. Nausea improved post BM, Amiodarone  dose decreased on 12/11 as well  Current wt 75.7 kg; up from 74.7 kg, 75 kg the day before. Lowest wt this admission around 70 kg. UOP 2.3 L in 24 hours  Labs: Sodium 130 (L) Potassium 3.7 (wdl) Magnesium  1.9 (wdl) CBGs 127-153 (acceptable range) BUN 28 Creatinine 2.02  Meds: Senna Docusate Miralax   Diet Order:   Diet  Order             Diet regular Fluid consistency: Thin  Diet effective now                   EDUCATION NEEDS:   Not appropriate for education at this time  Skin:  Skin Assessment: Reviewed RN Assessment  Last BM:  12/12  Height:   Ht Readings from Last 1 Encounters:  09/05/24 5' 10 (1.778 m)    Weight:   Wt Readings from Last 1 Encounters:  09/08/24 75.7 kg     BMI:  Body mass index is 23.95 kg/m.  Estimated Nutritional Needs:   Kcal:  2000-2200  Protein:  95-110g  Fluid:  2L   Betsey Finger MS, RDN, LDN, CNSC Registered Dietitian 3 Clinical Nutrition RD Inpatient Contact Info in Amion

## 2024-09-08 NOTE — Plan of Care (Signed)
°  Problem: Clinical Measurements: Goal: Ability to maintain clinical measurements within normal limits will improve Outcome: Progressing Goal: Will remain free from infection Outcome: Progressing Goal: Diagnostic test results will improve Outcome: Progressing Goal: Respiratory complications will improve Outcome: Progressing Goal: Cardiovascular complication will be avoided Outcome: Progressing   Problem: Activity: Goal: Risk for activity intolerance will decrease Outcome: Progressing   Problem: Nutrition: Goal: Adequate nutrition will be maintained Outcome: Progressing   Problem: Coping: Goal: Level of anxiety will decrease Outcome: Adequate for Discharge

## 2024-09-08 NOTE — Progress Notes (Addendum)
 PHARMACY - ANTICOAGULATION CONSULT NOTE  Pharmacy Consult for IV heparin  Indication: afib/ Impella 5.5  No Known Allergies  Patient Measurements: Height: 5' 10 (177.8 cm) Weight: 75.7 kg (166 lb 14.2 oz) IBW/kg (Calculated) : 73 HEPARIN  DW (KG): 73.1  Vital Signs: Temp: 98.6 F (37 C) (12/12 1000) BP: 132/97 (12/12 0700) Pulse Rate: 91 (12/12 1000)  Labs: Recent Labs    09/06/24 0430 09/06/24 0431 09/06/24 1752 09/07/24 0420 09/07/24 1545 09/08/24 0507  HGB 11.5* 11.2*  --  10.8*  --  10.6*  HCT 31.6* 33.0*  --  30.5*  --  29.1*  PLT 147*  --   --  146*  --  143*  APTT  --   --  52* 52*  --  49*  HEPARINUNFRC  --   --  >1.10* 1.08*  --  0.58  CREATININE 2.45*  --   --  1.87* 2.03* 2.02*    Estimated Creatinine Clearance: 31.1 mL/min (A) (by C-G formula based on SCr of 2.02 mg/dL (H)).   Medical History: Past Medical History:  Diagnosis Date   Atrial fibrillation (HCC)    CHF (congestive heart failure) (HCC)    Cirrhosis (HCC)    ED (erectile dysfunction)    Hyperglycemia    Hyperlipidemia    Hypertension    Internal hemorrhoids    Myocardial infarction (HCC) 2008   SCCA (squamous cell carcinoma) of skin    Left ear.  MOHS surgery 2014 [facility]   Skin cancer of face    2012 basal cell   Thyroid  disease     Medications:  Infusions:   amiodarone  30 mg/hr (09/08/24 1000)   heparin  800 Units/hr (09/08/24 1000)   milrinone  0.25 mcg/kg/min (09/08/24 1000)   piperacillin -tazobactam (ZOSYN )  IV 12.5 mL/hr at 09/08/24 1000   sodium bicarbonate  25 mEq (Impella PURGE) in dextrose  5 % 1000 mL bag      Assessment: 78 yo male on apixaban  PTA for afib (last dose 12/7).  Switched to heparin  drip briefly, then held for Impella 5.5 placement.  Pharmacy asked to resume IV heparin  today.  Previously had therapeutic aPTT on 950 units/hr.  Heparin  level supratherapeutic and not correlating - with recent apixaban  use - dose heparin  with aptt Heparin  drip 800  uts/hr with aptt 49sec CBC stable - no over bleeding noted Patient's urine is dark orange tinged - has been this way - stable   Goal of Therapy:  Heparin  level 0.3-0.5 APTT 66-85 Monitor platelets by anticoagulation protocol: Yes   Plan:  Increase IV heparin  without bolus to rate of 900 units/hr.   Daily heparin  level, aPTT and CBC. Per discussion - titrate slowly to goal. Monitor s/s bleeding     Olam Chalk Pharm.D. CPP, BCPS Clinical Pharmacist [phone] 09/08/2024 10:09 AM   **Pharmacist phone directory can now be found on [email or web address] ([email or web address]).  Listed under [facility].

## 2024-09-08 NOTE — Progress Notes (Signed)
 NAME:  [name] [name], MRN:  [id number], DOB:  09-Feb-1946, LOS: 11 ADMISSION DATE:  08/27/2024, CONSULTATION DATE:  12/9 REFERRING MD:  Zenaida, AHF CHIEF COMPLAINT: biventricular heart failure    History of Present Illness:  78 year old male with past medical history of hypothyroidism, hypertension, hyperlipidemia, paroxysmal atrial fibrillation on Eliquis , chronic biventricular heart failure, ischemic cardiomyopathy, CAD, severe functional MR who was initially admitted on 12/1 with shortness of breath.  Prior to arrival had 3 days of worsening shortness of breath, cough, peripheral edema.  In the ED was in A-fib RVR with rates up to 160s, K5.5, bicarb 19, SCR 1.49, BNP 1633, troponin 11> 15, lactic 2.  Chest x-ray with vascular congestion.  He was admitted to hospitalist and started on diuresis.  Cardiology and heart failure saw patient on 12/2.  PICC placement was ordered for Co. ox assessment, plan for RHC once stabilized.  On 12/3 after PICC was placed, Co. ox 49, Fick CI 1.02.  He was started on milrinone  0.25, continued on Lasix , Diamox , started on IV Amio.  He had a right heart cath on 12/5, T CTS consult for bad workup.  While on milrinone  had brief improvement in Co. ox until 12/8 when he had drop in [place] to 44%, weight up 5 pounds, increased A-fib rates and was given diuresis.  TCTS was asked for Impella 5.5 placement consult.  Impella was placed on 12/9 and patient was taken to ICU postoperatively.  CCM consulted for help with medical management.  Pertinent  Medical History  hypothyroidism, hypertension, hyperlipidemia, paroxysmal atrial fibrillation on Eliquis , chronic biventricular heart failure, ischemic cardiomyopathy, CAD, severe functional MR  Significant Hospital Events: Including procedures, antibiotic start and stop dates in addition to other pertinent events   12/1: admit for AoC systolic heart failure, started on diuresis  12/2: cardiology and AHF consults  12/3: PICC placed,  coox 49, CI 1.02 > milrinone , ongoing diuresis  12/5: RHC RA 4, RV 39/5, PA 37/24(28), PCW 20, Fick CI 2.4, PAPi 3.25 on milrinone  0.25; TCTS consult for VAD work up  12/8: coox 44% dropped back off, weight up 5lb, afib rate up, given diuresis; consult to TCTS for impella 5.5 placement  12/9: went for impella 5.5>ICU post-op, CCM consult 12/9: Extubated  12/10: Heparin  started  Interim History / Subjective:  No acute overnight events. Patient ambulated 1.5 laps this morning. Having bowel movements. No complaints.   Objective   Blood pressure (!) 132/97, pulse 91, temperature 98.6 F (37 C), resp. rate 12, height 5' 10 (1.778 m), weight 75.7 kg, SpO2 92%. CVP:  [6 mmHg-22 mmHg] 16 mmHg      Intake/Output Summary (Last 24 hours) at 09/08/2024 1146 Last data filed at 09/08/2024 1000 Gross per 24 hour  Intake 1367.94 ml  Output 2120 ml  Net -752.06 ml   Filed Weights   09/06/24 0500 09/07/24 0500 09/08/24 0500  Weight: 75 kg 74.7 kg 75.7 kg   Examination: General: no acute distress, sitting in chair comfortably  HENT: [place]/AT, sclera anicteric  Lungs: breathing comfortably on room air, clear to auscultation bilaterally  Cardiovascular: irregular rate and rhythm, normal S1 and S2, no m/r/g Abdomen: soft, non-distended, normoactive bowel sounds Extremities: 1+ pitting edema to bilateral lower extremities  Neuro: awake and responsive, moves all extremities  GU: foley in place  Impella 5.5 at P7 with flow 4.6 L/min   Labs/imaging reviewed   Resolved Hospital Problem list   Endotracheally intubated  Assessment & Plan:   Acute  on chronic biventricular heart failure  Cardiogenic shock s/p Impella 5.5 placement  Severe MR  VT/NSVT  CAD  LVAD work up  - PT/OT following - Heart failure following, appreciate recommendations - Cardiothoracic surgery following, appreciate recommendations - Continue milrinone  - GDMT limited by renal function  - working towards LVAD  Permament  Afib - continue amiodarone  drip - continue heparin  drip   AKI on CKD IIIa  Hyponatremia Hypokalemia - appears to be approaching euvolemia, hold diuresis today - continue to monitor BMP - avoid nephrotoxic agents, renally dose medications, ensure adequate renal perfusion  - replete electrolytes   Prostate abscess versus mass Urology consulted on 12/8.  Recommended treatment. Tolerated Zosyn  well. - Continue Zosyn  10-day course end date 12/20, repeat imaging in 2 weeks (12/24)  History of hypertension  Hyperlipidemia  - continue statin  - holding home antihypertensives with cardiogenic shock  History of hypothyroidism  - continue home synthroid    Diet: Regular DVT prophylaxis: On heparin  systemic Dispo: Pending clinical improvement  Labs   CBC: Recent Labs  Lab 09/03/24 0539 09/04/24 0513 09/05/24 0811 09/05/24 1036 09/05/24 1055 09/05/24 1543 09/06/24 0430 09/06/24 0431 09/07/24 0420 09/08/24 0507  WBC 13.5* 14.3*  --  12.2*  --   --  15.8*  --  10.7* 10.2  NEUTROABS 10.3* 10.5*  --  9.6*  --   --  13.1*  --  8.6*  --   HGB 13.0 13.7   < > 11.3*   < > 10.9* 11.5* 11.2* 10.8* 10.6*  HCT 36.8* 38.0*   < > 30.8*   < > 32.0* 31.6* 33.0* 30.5* 29.1*  MCV 91.1 90.9  --  90.3  --   --  90.5  --  91.9 91.2  PLT 148* 170  --  144*  --   --  147*  --  146* 143*   < > = values in this interval not displayed.    Basic Metabolic Panel: Recent Labs  Lab 09/03/24 0539 09/04/24 0513 09/05/24 0811 09/05/24 1036 09/05/24 1055 09/05/24 1637 09/06/24 0430 09/06/24 0431 09/07/24 0420 09/07/24 1545 09/08/24 0507  NA 133* 133*   < > 129*   < > 128* 128* 127* 128* 132* 130*  K 3.8 4.5   < > 3.4*   < > 4.1 3.9 4.1 3.2* 3.6 3.7  CL 95* 95*   < > 92*  --  92* 92*  --  91* 90* 93*  CO2 27 25  --  25  --  25 26  --  30 29 28   GLUCOSE 118* 114*   < > 156*  --  122* 125*  --  152* 128* 100*  BUN 44* 46*   < > 50*  --  49* 46*  --  30* 31* 28*  CREATININE 1.83* 1.99*   < > 2.34*   --  2.39* 2.45*  --  1.87* 2.03* 2.02*  CALCIUM 9.0 9.4  --  8.1*  --  8.1* 8.3*  --  8.0* 8.2* 7.8*  MG 2.1 2.0  --  1.9  --   --   --   --  2.1  --  1.9  PHOS  --   --   --  3.8  --   --   --   --   --   --   --    < > = values in this interval not displayed.   GFR: Estimated Creatinine Clearance: 31.1 mL/min (A) (by C-G  formula based on SCr of 2.02 mg/dL (H)). Recent Labs  Lab 09/05/24 1036 09/06/24 0430 09/07/24 0420 09/08/24 0507  WBC 12.2* 15.8* 10.7* 10.2    Liver Function Tests: Recent Labs  Lab 09/04/24 0513 09/05/24 1036 09/06/24 0430 09/07/24 0420 09/08/24 0507  AST 17 15 13* 13* 14*  ALT 16 12 12 8 11   ALKPHOS 58 46 49 48 58  BILITOT 1.7* 1.3* 1.6* 1.5* 1.3*  PROT 7.1 5.7* 6.0* 5.8* 5.6*  ALBUMIN 3.7 3.1* 3.2* 2.9* 2.8*   No results for input(s): LIPASE, AMYLASE in the last 168 hours. No results for input(s): AMMONIA in the last 168 hours.  ABG    Component Value Date/Time   PHART 7.414 09/06/2024 0431   PCO2ART 40.2 09/06/2024 0431   PO2ART 93 09/06/2024 0431   HCO3 25.7 09/06/2024 0431   TCO2 27 09/06/2024 0431   ACIDBASEDEF 1.0 08/31/2023 1020   O2SAT 76 09/08/2024 0507     Coagulation Profile: No results for input(s): INR, PROTIME in the last 168 hours.  Cardiac Enzymes: No results for input(s): CKTOTAL, CKMB, CKMBINDEX, TROPONINI in the last 168 hours.  HbA1C: Hgb A1c MFr Bld  Date/Time Value Ref Range Status  09/01/2024 05:00 AM 5.7 (H) 4.8 - 5.6 % Final    Comment:    (NOTE) Diagnosis of Diabetes The following HbA1c ranges recommended by the American Diabetes Association (ADA) may be used as an aid in the diagnosis of diabetes mellitus.  Hemoglobin             Suggested A1C NGSP%              Diagnosis  <5.7                   Non Diabetic  5.7-6.4                Pre-Diabetic  >6.4                   Diabetic  <7.0                   Glycemic control for                       adults with diabetes.     12/30/2023 08:22 AM 5.9 4.6 - 6.5 % Final    Comment:    Glycemic Control Guidelines for People with Diabetes:Non Diabetic:  <6%Goal of Therapy: <7%Additional Action Suggested:  >8%     CBG: Recent Labs  Lab 09/05/24 2011 09/05/24 2305 09/06/24 0428 09/06/24 0642 09/06/24 1135  GLUCAP 134* 118* 127* 153* 126*   Critical care time:    The patient is critically ill with multiple organ system failure and requires high complexity decision making for assessment and support, frequent evaluation and titration of therapies, advanced monitoring, review of radiographic studies and interpretation of complex data.   Critical Care Time devoted to patient care services, exclusive of separately billable procedures, described in this note is 30 minutes.  Rexene LOISE Blush, PA-C [facility] 09/08/2024 11:55 AM  Please see [email or web address] for pager details.  From 7A-7P if no response, please call [phone] After hours, please call ELink [phone]

## 2024-09-08 NOTE — Plan of Care (Signed)
°  Problem: Education: Goal: Knowledge of General Education information will improve Description: Including pain rating scale, medication(s)/side effects and non-pharmacologic comfort measures Outcome: Progressing   Problem: Health Behavior/Discharge Planning: Goal: Ability to manage health-related needs will improve Outcome: Progressing   Problem: Clinical Measurements: Goal: Ability to maintain clinical measurements within normal limits will improve Outcome: Progressing Goal: Will remain free from infection Outcome: Progressing Goal: Diagnostic test results will improve Outcome: Progressing Goal: Respiratory complications will improve Outcome: Progressing Goal: Cardiovascular complication will be avoided Outcome: Progressing   Problem: Activity: Goal: Risk for activity intolerance will decrease Outcome: Progressing   Problem: Nutrition: Goal: Adequate nutrition will be maintained Outcome: Progressing   Problem: Coping: Goal: Level of anxiety will decrease Outcome: Progressing   Problem: Elimination: Goal: Will not experience complications related to bowel motility Outcome: Progressing Goal: Will not experience complications related to urinary retention Outcome: Progressing   Problem: Pain Managment: Goal: General experience of comfort will improve and/or be controlled Outcome: Progressing   Problem: Safety: Goal: Ability to remain free from injury will improve Outcome: Progressing   Problem: Skin Integrity: Goal: Risk for impaired skin integrity will decrease Outcome: Progressing   Problem: Education: Goal: Understanding of CV disease, CV risk reduction, and recovery process will improve Outcome: Progressing Goal: Individualized Educational Video(s) Outcome: Progressing   Problem: Activity: Goal: Ability to return to baseline activity level will improve Outcome: Progressing   Problem: Cardiovascular: Goal: Ability to achieve and maintain adequate  cardiovascular perfusion will improve Outcome: Progressing Goal: Vascular access site(s) Level 0-1 will be maintained Outcome: Progressing   Problem: Health Behavior/Discharge Planning: Goal: Ability to safely manage health-related needs after discharge will improve Outcome: Progressing   Problem: Cardiac: Goal: Ability to achieve and maintain adequate cardiopulmonary perfusion will improve Outcome: Progressing Goal: Vascular access site(s) Level 0-1 will be maintained Outcome: Progressing   Problem: Fluid Volume: Goal: Ability to achieve a balanced intake and output will improve Outcome: Progressing   Problem: Physical Regulation: Goal: Complications related to the disease process, condition or treatment will be avoided or minimized Outcome: Progressing   Problem: Respiratory: Goal: Will regain and/or maintain adequate ventilation Outcome: Progressing   Problem: Education: Goal: Ability to describe self-care measures that may prevent or decrease complications (Diabetes Survival Skills Education) will improve Outcome: Progressing Goal: Individualized Educational Video(s) Outcome: Progressing   Problem: Coping: Goal: Ability to adjust to condition or change in health will improve Outcome: Progressing   Problem: Fluid Volume: Goal: Ability to maintain a balanced intake and output will improve Outcome: Progressing   Problem: Health Behavior/Discharge Planning: Goal: Ability to identify and utilize available resources and services will improve Outcome: Progressing Goal: Ability to manage health-related needs will improve Outcome: Progressing   Problem: Metabolic: Goal: Ability to maintain appropriate glucose levels will improve Outcome: Progressing   Problem: Nutritional: Goal: Maintenance of adequate nutrition will improve Outcome: Progressing Goal: Progress toward achieving an optimal weight will improve Outcome: Progressing   Problem: Skin Integrity: Goal:  Risk for impaired skin integrity will decrease Outcome: Progressing   Problem: Tissue Perfusion: Goal: Adequacy of tissue perfusion will improve Outcome: Progressing

## 2024-09-09 DIAGNOSIS — I5023 Acute on chronic systolic (congestive) heart failure: Secondary | ICD-10-CM | POA: Diagnosis not present

## 2024-09-09 LAB — CULTURE, BLOOD (ROUTINE X 2)
Culture: NO GROWTH
Culture: NO GROWTH
Special Requests: ADEQUATE
Special Requests: ADEQUATE

## 2024-09-09 LAB — COMPREHENSIVE METABOLIC PANEL WITH GFR
ALT: 15 U/L (ref 0–44)
AST: 20 U/L (ref 15–41)
Albumin: 2.9 g/dL — ABNORMAL LOW (ref 3.5–5.0)
Alkaline Phosphatase: 69 U/L (ref 38–126)
Anion gap: 7 (ref 5–15)
BUN: 20 mg/dL (ref 8–23)
CO2: 29 mmol/L (ref 22–32)
Calcium: 7.9 mg/dL — ABNORMAL LOW (ref 8.9–10.3)
Chloride: 97 mmol/L — ABNORMAL LOW (ref 98–111)
Creatinine, Ser: 1.87 mg/dL — ABNORMAL HIGH (ref 0.61–1.24)
GFR, Estimated: 36 mL/min — ABNORMAL LOW (ref >60)
Glucose, Bld: 85 mg/dL (ref 70–99)
Potassium: 3.7 mmol/L (ref 3.5–5.1)
Sodium: 133 mmol/L — ABNORMAL LOW (ref 135–145)
Total Bilirubin: 1.3 mg/dL — ABNORMAL HIGH (ref 0.0–1.2)
Total Protein: 5.7 g/dL — ABNORMAL LOW (ref 6.5–8.1)

## 2024-09-09 LAB — CBC
HCT: 30 % — ABNORMAL LOW (ref 39.0–52.0)
Hemoglobin: 10.6 g/dL — ABNORMAL LOW (ref 13.0–17.0)
MCH: 33 pg (ref 26.0–34.0)
MCHC: 35.3 g/dL (ref 30.0–36.0)
MCV: 93.5 fL (ref 80.0–100.0)
Platelets: 153 K/uL (ref 150–400)
RBC: 3.21 MIL/uL — ABNORMAL LOW (ref 4.22–5.81)
RDW: 12.6 % (ref 11.5–15.5)
WBC: 9.8 K/uL (ref 4.0–10.5)
nRBC: 0 % (ref 0.0–0.2)

## 2024-09-09 LAB — APTT: aPTT: 55 s — ABNORMAL HIGH (ref 24–36)

## 2024-09-09 LAB — MAGNESIUM: Magnesium: 2.2 mg/dL (ref 1.7–2.4)

## 2024-09-09 LAB — COOXEMETRY PANEL
Carboxyhemoglobin: 1.7 % — ABNORMAL HIGH (ref 0.5–1.5)
Methemoglobin: <0.7 % (ref 0.0–1.5)
O2 Saturation: 72 %
Total hemoglobin: 10.8 g/dL — ABNORMAL LOW (ref 12.0–16.0)

## 2024-09-09 LAB — LACTATE DEHYDROGENASE: LDH: 142 U/L (ref 105–235)

## 2024-09-09 LAB — HEPARIN LEVEL (UNFRACTIONATED)
Heparin Unfractionated: 0.21 [IU]/mL — ABNORMAL LOW (ref 0.30–0.70)
Heparin Unfractionated: 0.19 [IU]/mL — ABNORMAL LOW (ref 0.30–0.70)

## 2024-09-09 MED ORDER — POTASSIUM CHLORIDE CRYS ER 20 MEQ PO TBCR
40.0000 meq | EXTENDED_RELEASE_TABLET | Freq: Once | ORAL | Status: AC
  Administered 2024-09-09: 40 meq via ORAL
  Filled 2024-09-09: qty 2

## 2024-09-09 NOTE — Plan of Care (Signed)
°  Problem: Education: Goal: Knowledge of General Education information will improve Description: Including pain rating scale, medication(s)/side effects and non-pharmacologic comfort measures Outcome: Progressing   Problem: Health Behavior/Discharge Planning: Goal: Ability to manage health-related needs will improve Outcome: Progressing   Problem: Clinical Measurements: Goal: Ability to maintain clinical measurements within normal limits will improve Outcome: Progressing Goal: Will remain free from infection Outcome: Progressing Goal: Diagnostic test results will improve Outcome: Progressing Goal: Respiratory complications will improve Outcome: Progressing Goal: Cardiovascular complication will be avoided Outcome: Progressing   Problem: Activity: Goal: Risk for activity intolerance will decrease Outcome: Progressing   Problem: Nutrition: Goal: Adequate nutrition will be maintained Outcome: Progressing   Problem: Coping: Goal: Level of anxiety will decrease Outcome: Progressing   Problem: Elimination: Goal: Will not experience complications related to bowel motility Outcome: Progressing Goal: Will not experience complications related to urinary retention Outcome: Progressing   Problem: Pain Managment: Goal: General experience of comfort will improve and/or be controlled Outcome: Progressing   Problem: Safety: Goal: Ability to remain free from injury will improve Outcome: Progressing   Problem: Skin Integrity: Goal: Risk for impaired skin integrity will decrease Outcome: Progressing   Problem: Education: Goal: Understanding of CV disease, CV risk reduction, and recovery process will improve Outcome: Progressing Goal: Individualized Educational Video(s) Outcome: Progressing   Problem: Activity: Goal: Ability to return to baseline activity level will improve Outcome: Progressing   Problem: Cardiovascular: Goal: Ability to achieve and maintain adequate  cardiovascular perfusion will improve Outcome: Progressing Goal: Vascular access site(s) Level 0-1 will be maintained Outcome: Progressing   Problem: Health Behavior/Discharge Planning: Goal: Ability to safely manage health-related needs after discharge will improve Outcome: Progressing   Problem: Cardiac: Goal: Ability to achieve and maintain adequate cardiopulmonary perfusion will improve Outcome: Progressing Goal: Vascular access site(s) Level 0-1 will be maintained Outcome: Progressing   Problem: Fluid Volume: Goal: Ability to achieve a balanced intake and output will improve Outcome: Progressing   Problem: Physical Regulation: Goal: Complications related to the disease process, condition or treatment will be avoided or minimized Outcome: Progressing   Problem: Respiratory: Goal: Will regain and/or maintain adequate ventilation Outcome: Progressing   Problem: Education: Goal: Ability to describe self-care measures that may prevent or decrease complications (Diabetes Survival Skills Education) will improve Outcome: Progressing Goal: Individualized Educational Video(s) Outcome: Progressing   Problem: Coping: Goal: Ability to adjust to condition or change in health will improve Outcome: Progressing   Problem: Fluid Volume: Goal: Ability to maintain a balanced intake and output will improve Outcome: Progressing   Problem: Health Behavior/Discharge Planning: Goal: Ability to identify and utilize available resources and services will improve Outcome: Progressing Goal: Ability to manage health-related needs will improve Outcome: Progressing   Problem: Metabolic: Goal: Ability to maintain appropriate glucose levels will improve Outcome: Progressing   Problem: Nutritional: Goal: Maintenance of adequate nutrition will improve Outcome: Progressing Goal: Progress toward achieving an optimal weight will improve Outcome: Progressing   Problem: Skin Integrity: Goal:  Risk for impaired skin integrity will decrease Outcome: Progressing   Problem: Tissue Perfusion: Goal: Adequacy of tissue perfusion will improve Outcome: Progressing

## 2024-09-09 NOTE — Progress Notes (Signed)
 4 Days Post-Op Procedures (LRB): INSERTION, CARDIAC ASSIST DEVICE, IMPELLA (N/A) ECHOCARDIOGRAM, TRANSESOPHAGEAL (N/A) Subjective: Some nausea, no pain  Objective: Vital signs in last 24 hours: Temp:  [98.5 F (36.9 C)-99.5 F (37.5 C)] 99.3 F (37.4 C) (12/13 0900) Pulse Rate:  [57-135] 80 (12/13 0900) Cardiac Rhythm: Atrial fibrillation (12/13 0800) Resp:  [13-27] 24 (12/13 0900) BP: (111-132)/(71-98) 122/77 (12/13 0900) SpO2:  [86 %-97 %] 95 % (12/13 0900) Weight:  [76.2 kg] 76.2 kg (12/13 0500)  Hemodynamic parameters for last 24 hours: CVP:  [9 mmHg-23 mmHg] 11 mmHg  Intake/Output from previous day: 12/12 0701 - 12/13 0700 In: 1336 [P.O.:240; I.V.:680; IV Piggyback:185.1] Out: 1630 [Urine:1630] Intake/Output this shift: Total I/O In: 372.7 [P.O.:240; I.V.:89.6; Other:18.8; IV Piggyback:24.4] Out: 145 [Urine:145]  General appearance: alert, cooperative, and no distress Wound: intact, minimal swelling  Lab Results: Recent Labs    09/08/24 0507 09/09/24 0509  WBC 10.2 9.8  HGB 10.6* 10.6*  HCT 29.1* 30.0*  PLT 143* 153   BMET:  Recent Labs    09/08/24 0507 09/09/24 0509  NA 130* 133*  K 3.7 3.7  CL 93* 97*  CO2 28 29  GLUCOSE 100* 85  BUN 28* 20  CREATININE 2.02* 1.87*  CALCIUM 7.8* 7.9*    PT/INR: No results for input(s): LABPROT, INR in the last 72 hours. ABG    Component Value Date/Time   PHART 7.414 09/06/2024 0431   HCO3 25.7 09/06/2024 0431   TCO2 27 09/06/2024 0431   ACIDBASEDEF 1.0 08/31/2023 1020   O2SAT 72 09/09/2024 0509   CBG (last 3)  Recent Labs    09/06/24 1135  GLUCAP 126*    Assessment/Plan: S/P Procedures (LRB): INSERTION, CARDIAC ASSIST DEVICE, IMPELLA (N/A) ECHOCARDIOGRAM, TRANSESOPHAGEAL (N/A) POD # 4 Impella Impella functioning well p7 with 4.4 L/min flow Creatinine slowly improving   LOS: 12 days    [name] [name] Millers 09/09/2024

## 2024-09-09 NOTE — Progress Notes (Signed)
 PHARMACY - ANTICOAGULATION CONSULT NOTE  Pharmacy Consult for IV heparin  Indication: afib/ Impella 5.5  No Known Allergies  Patient Measurements: Height: 5' 10 (177.8 cm) Weight: 76.2 kg (167 lb 15.9 oz) IBW/kg (Calculated) : 73 HEPARIN  DW (KG): 73.1  Vital Signs: Temp: 99.5 F (37.5 C) (12/13 1600) Temp Source: Bladder (12/13 1600) BP: 117/83 (12/13 1800) Pulse Rate: 110 (12/13 1800)  Labs: Recent Labs    09/07/24 0420 09/07/24 1545 09/08/24 0507 09/09/24 0509 09/09/24 1807  HGB 10.8*  --  10.6* 10.6*  --   HCT 30.5*  --  29.1* 30.0*  --   PLT 146*  --  143* 153  --   APTT 52*  --  49* 55*  --   HEPARINUNFRC 1.08*  --  0.58 0.21* 0.19*  CREATININE 1.87* 2.03* 2.02* 1.87*  --     Estimated Creatinine Clearance: 33.6 mL/min (A) (by C-G formula based on SCr of 1.87 mg/dL (H)).   Medical History: Past Medical History:  Diagnosis Date   Atrial fibrillation (HCC)    CHF (congestive heart failure) (HCC)    Cirrhosis (HCC)    ED (erectile dysfunction)    Hyperglycemia    Hyperlipidemia    Hypertension    Internal hemorrhoids    Myocardial infarction (HCC) 2008   SCCA (squamous cell carcinoma) of skin    Left ear.  MOHS surgery 2014 [facility]   Skin cancer of face    2012 basal cell   Thyroid  disease     Medications:  Infusions:   amiodarone  30 mg/hr (09/09/24 1800)   heparin  1,000 Units/hr (09/09/24 1800)   milrinone  0.25 mcg/kg/min (09/09/24 1800)   piperacillin -tazobactam (ZOSYN )  IV 12.5 mL/hr at 09/09/24 1800   sodium bicarbonate  25 mEq (Impella PURGE) in dextrose  5 % 1000 mL bag      Assessment: 78 yo male on apixaban  PTA for afib (last dose 12/7).  Switched to heparin  drip briefly, then held for Impella 5.5 placement.  Pharmacy asked to resume IV heparin  today.  Previously had therapeutic aPTT on 950 units/hr.  09/09/24 pm: Heparin  level 0.19, subtherapeutic at heparin  1000 units/hr. No issues with infusion running or signs of bleeding per  RN.  Goal of Therapy:  Heparin  level 0.3-0.5 APTT 66-85 Monitor platelets by anticoagulation protocol: Yes   Plan:  Increase IV heparin  without bolus to rate of 1100 units/hr - per discussion with AHF, okay to titrate slowly to goal Heparin  level with AM labs Daily heparin  level, aPTT and CBC Monitor s/sx bleeding   Larraine Brazier, PharmD Clinical Pharmacist 09/09/2024  6:52 PM **Pharmacist phone directory can now be found on [email or web address] ([email or web address]).  Listed under [facility].

## 2024-09-09 NOTE — Progress Notes (Signed)
 PHARMACY - ANTICOAGULATION CONSULT NOTE  Pharmacy Consult for IV heparin  Indication: afib/ Impella 5.5  No Known Allergies  Patient Measurements: Height: 5' 10 (177.8 cm) Weight: 76.2 kg (167 lb 15.9 oz) IBW/kg (Calculated) : 73 HEPARIN  DW (KG): 73.1  Vital Signs: Temp: 99.3 F (37.4 C) (12/13 0600) Temp Source: Oral (12/13 0330) BP: 123/98 (12/13 0600) Pulse Rate: 77 (12/13 0600)  Labs: Recent Labs    09/07/24 0420 09/07/24 1545 09/08/24 0507 09/09/24 0509  HGB 10.8*  --  10.6* 10.6*  HCT 30.5*  --  29.1* 30.0*  PLT 146*  --  143* 153  APTT 52*  --  49* 55*  HEPARINUNFRC 1.08*  --  0.58 0.21*  CREATININE 1.87* 2.03* 2.02* 1.87*    Estimated Creatinine Clearance: 33.6 mL/min (A) (by C-G formula based on SCr of 1.87 mg/dL (H)).   Medical History: Past Medical History:  Diagnosis Date   Atrial fibrillation (HCC)    CHF (congestive heart failure) (HCC)    Cirrhosis (HCC)    ED (erectile dysfunction)    Hyperglycemia    Hyperlipidemia    Hypertension    Internal hemorrhoids    Myocardial infarction (HCC) 2008   SCCA (squamous cell carcinoma) of skin    Left ear.  MOHS surgery 2014 [facility]   Skin cancer of face    2012 basal cell   Thyroid  disease     Medications:  Infusions:   amiodarone  30 mg/hr (09/09/24 0600)   heparin  900 Units/hr (09/09/24 0600)   milrinone  0.25 mcg/kg/min (09/09/24 0600)   piperacillin -tazobactam (ZOSYN )  IV 3.375 g (09/09/24 0603)   sodium bicarbonate  25 mEq (Impella PURGE) in dextrose  5 % 1000 mL bag      Assessment: 78 yo male on apixaban  PTA for afib (last dose 12/7).  Switched to heparin  drip briefly, then held for Impella 5.5 placement.  Pharmacy asked to resume IV heparin  today.  Previously had therapeutic aPTT on 950 units/hr.  09/09/24: Heparin  level 0.21 and aPTT 55 sec, subtherapeutic at heparin  900 units/hr. No issues with infusion running or signs of bleeding per RN. CBC stable (Hgb 10.6, PLT 153).   Goal  of Therapy:  Heparin  level 0.3-0.5 APTT 66-85 Monitor platelets by anticoagulation protocol: Yes   Plan:  Increase IV heparin  without bolus to rate of 1000 units/hr - per discussion with AHF, okay to titrate slowly to goal Heparin  level in 8 hours Daily heparin  level, aPTT and CBC Monitor s/sx bleeding   Morna Breach, PharmD, BCPS PGY2 Cardiology Pharmacy Resident 09/09/2024 7:13 AM

## 2024-09-09 NOTE — Progress Notes (Signed)
 Patient ID: [name] [name], male   DOB: [birth year], 78 y.o.   MRN: [id number]     Advanced Heart Failure Rounding Note  Cardiologist: [name], MD  AHF: Dr. Zenaida  Chief Complaint: A/c Systolic HF Patient Profile   78 y/o male w/ chronic systolic heart failure w/ biventricular dysfunction d/t mixed ischemic/nonischemic CM, CAD, severe functional MR and permanent atrial fibrillation admitted w/ a/c CHF w/ NYHA Class IV symptoms. Now w/ low output. Initial Co-ox 47%. Started on Milrinone .   Subjective:    12/9 S/P Impella 5.5 Placement.   Continues on milrinone  0.25 mcg + amio 30 mg/min.  He is in rate-controlled atrial fibrillation 90s-100s. Co-ox 72%, CVP 5. Creatinine 2.02 => 1.87 today.   Impella 5.5 Flow (Liters/min): 4.4 Liters/min Performance Level: P7 LDH 144 => 142 On heparin  gtt  On Zosyn  for prostatitis.    Already walked around the unit this morning. Nausea much better today. Denies CP/SOB.   Objective:    Weight Range: 76.2 kg Body mass index is 24.1 kg/m.   Vital Signs:   Temp:  [98.5 F (36.9 C)-99.5 F (37.5 C)] 99.3 F (37.4 C) (12/13 0900) Pulse Rate:  [57-135] 80 (12/13 0900) Resp:  [12-27] 24 (12/13 0900) BP: (111-132)/(71-98) 122/77 (12/13 0900) SpO2:  [86 %-97 %] 95 % (12/13 0900) Weight:  [76.2 kg] 76.2 kg (12/13 0500) Last BM Date : 09/09/24  Weight change: Filed Weights   09/07/24 0500 09/08/24 0500 09/09/24 0500  Weight: 74.7 kg 75.7 kg 76.2 kg   Intake/Output:  Intake/Output Summary (Last 24 hours) at 09/09/2024 0953 Last data filed at 09/09/2024 0900 Gross per 24 hour  Intake 1689.75 ml  Output 1575 ml  Net 114.75 ml    Physical Exam   General: NAD Neck: No JVD, no thyromegaly or thyroid  nodule.  Lungs: Clear to auscultation bilaterally with normal respiratory effort. CV: Nondisplaced PMI.  Heart regular S1/S2, no S3/S4, no murmur.  No peripheral edema.   Abdomen: Soft, nontender, no hepatosplenomegaly, no distention.   Skin: Intact without lesions or rashes.  Neurologic: Alert and oriented x 3.  Psych: Normal affect. Extremities: No clubbing or cyanosis.  HEENT: Normal.   Telemetry    A fib 80s-90s (Personally reviewed)    Labs    CBC Recent Labs    09/07/24 0420 09/08/24 0507 09/09/24 0509  WBC 10.7* 10.2 9.8  NEUTROABS 8.6*  --   --   HGB 10.8* 10.6* 10.6*  HCT 30.5* 29.1* 30.0*  MCV 91.9 91.2 93.5  PLT 146* 143* 153   Basic Metabolic Panel Recent Labs    87/87/74 0507 09/09/24 0509  NA 130* 133*  K 3.7 3.7  CL 93* 97*  CO2 28 29  GLUCOSE 100* 85  BUN 28* 20  CREATININE 2.02* 1.87*  CALCIUM 7.8* 7.9*  MG 1.9 2.2   Liver Function Tests Recent Labs    09/08/24 0507 09/09/24 0509  AST 14* 20  ALT 11 15  ALKPHOS 58 69  BILITOT 1.3* 1.3*  PROT 5.6* 5.7*  ALBUMIN 2.8* 2.9*   BNP (last 3 results) Recent Labs    08/27/24 1907 08/28/24 0204  BNP 1,633.9* 1,952.5*   Medications:    Scheduled Medications:  arformoterol   15 mcg Nebulization BID   Chlorhexidine  Gluconate Cloth  6 each Topical Daily   feeding supplement  237 mL Oral TID BM   levothyroxine   137 mcg Oral QAC breakfast   polyethylene glycol  17 g Oral Daily  potassium chloride   40 mEq Oral Once   pravastatin   40 mg Oral QHS   revefenacin   175 mcg Nebulization Daily   senna-docusate  2 tablet Oral QHS   sodium chloride  flush  10-40 mL Intracatheter Q12H     Infusions:  amiodarone  30 mg/hr (09/09/24 0933)   heparin  900 Units/hr (09/09/24 0800)   milrinone  0.25 mcg/kg/min (09/09/24 0800)   piperacillin -tazobactam (ZOSYN )  IV 12.5 mL/hr at 09/09/24 0800   sodium bicarbonate  25 mEq (Impella PURGE) in dextrose  5 % 1000 mL bag      PRN Medications: sodium chloride , acetaminophen  **OR** acetaminophen  (TYLENOL ) oral liquid 160 mg/5 mL **OR** acetaminophen , docusate sodium , fentaNYL  (SUBLIMAZE ) injection, ondansetron  (ZOFRAN ) IV, oxyCODONE , polyethylene glycol, sodium chloride  flush  Assessment/Plan    1. Acute on Chronic Systolic Heart Failure w/ Low Output  - mixed ischemic/idiopathic CM  - Echo 2/25: LVEF 25 to 30%, moderate to severely dilated, mildly reduced RV function - Echo 12/1 EF <20%, RV not well visualized  - RHC 12/5 (on milrinone  0.25): Well compensated. mildly elevated L sided filling and PA pressures with mildly reduced CO by TD 4.3/2.3 - Initial co-ox 47%. Started on milrinone  0.25. CO-OX remained suboptimal. Escalation of care 12/9--> MCS Impella 5.5 placed.  - Impella at P7 Flow 4.4.  On heparin  gtt, discussed low PTT with pharmacist, will adjust.  - Continue milrinone  0.25 mcg/kg/min, co-ox stable at 72%.   - CVP 5. No diuretics.   - off digoxin  given elevated level and AKI. - other GDMT limited by renal function - Discussed at [facility], limited by renal dysfunction. Creatinine 2.02 => 1.87 today. Working towards LVAD but want to make sure renal function will be adequate. Will watch over weekend and reassess Monday.  Needs to walk in halls regularly, encourage po intake.   2. Permanent Afib  - Continue amiodarone  30 mg/hr. Using for rate control.  - Continue heparin  gtt.    3. Severe MR - functional  - LV cavity more dilated on this admission echo in setting of a/c CHF exacerbation, LVIDs 7.7 cm (6.40 cm)   4. VT/NSVT - in setting of worsening HF, 17 beat run tele 12/2 - increased frequency of NSVT runs  - keep K > 4.0 and Mg > 2.0 - Continue IV amiodarone  at 30 mg/hr.  - he previously declined ICD.    5. CAD - LHC 08/31/2023: LMCA with 20% distal stenosis. LAD with sequential 50% proximal and mid lesions. LCx with 30% mid stenosis, and proximal/mid RCA with 30% disease and 70% stenosis at the ostium of AM1  - continue statin - Has not been on ASA given need for anticoagulation.   6. AKI - b/l SCr 1.4 - bumped to 2.6-->2.5 - Creatinine 2.02 => 1.87 today  7. Hypokalemia - Replace K.   8. H/o Malignant Melanoma - occipital scalp, s/p excision @ [facility] in  2023 - Never followed up for surveillance head CTs  - CT head reassuring  9. Prostatitis - Possible prostate abscess.  - He is on Zosyn .   10. Nausea - Began Sunday, so not likely to be related to Zosyn  that started 12/10.  - Amiodarone  decreased 12/11 - Zofran  - Continue Ensure.  - Has been improved on lower amiodarone  dose.   Length of Stay: 12  CRITICAL CARE Performed by: [name]  Total critical care time: 35 minutes  Critical care time was exclusive of separately billable procedures and treating other patients.  Critical care was necessary to treat  or prevent imminent or life-threatening deterioration.  Critical care was time spent personally by me on the following activities: development of treatment plan with patient and/or surrogate as well as nursing, discussions with consultants, evaluation of patient's response to treatment, examination of patient, obtaining history from patient or surrogate, ordering and performing treatments and interventions, ordering and review of laboratory studies, ordering and review of radiographic studies, pulse oximetry and re-evaluation of patient's condition.  [name] 09/09/2024 9:53 AM

## 2024-09-10 DIAGNOSIS — I5023 Acute on chronic systolic (congestive) heart failure: Secondary | ICD-10-CM | POA: Diagnosis not present

## 2024-09-10 LAB — COMPREHENSIVE METABOLIC PANEL WITH GFR
ALT: 24 U/L (ref 0–44)
AST: 27 U/L (ref 15–41)
Albumin: 2.7 g/dL — ABNORMAL LOW (ref 3.5–5.0)
Alkaline Phosphatase: 76 U/L (ref 38–126)
Anion gap: 10 (ref 5–15)
BUN: 23 mg/dL (ref 8–23)
CO2: 27 mmol/L (ref 22–32)
Calcium: 8.1 mg/dL — ABNORMAL LOW (ref 8.9–10.3)
Chloride: 94 mmol/L — ABNORMAL LOW (ref 98–111)
Creatinine, Ser: 1.78 mg/dL — ABNORMAL HIGH (ref 0.61–1.24)
GFR, Estimated: 39 mL/min — ABNORMAL LOW (ref >60)
Glucose, Bld: 145 mg/dL — ABNORMAL HIGH (ref 70–99)
Potassium: 3.5 mmol/L (ref 3.5–5.1)
Sodium: 131 mmol/L — ABNORMAL LOW (ref 135–145)
Total Bilirubin: 1.4 mg/dL — ABNORMAL HIGH (ref 0.0–1.2)
Total Protein: 5.5 g/dL — ABNORMAL LOW (ref 6.5–8.1)

## 2024-09-10 LAB — COOXEMETRY PANEL
Carboxyhemoglobin: 1.9 % — ABNORMAL HIGH (ref 0.5–1.5)
Methemoglobin: <0.7 % (ref 0.0–1.5)
O2 Saturation: 68.7 %
Total hemoglobin: 10.2 g/dL — ABNORMAL LOW (ref 12.0–16.0)

## 2024-09-10 LAB — APTT: aPTT: 57 s — ABNORMAL HIGH (ref 24–36)

## 2024-09-10 LAB — CBC
HCT: 29.4 % — ABNORMAL LOW (ref 39.0–52.0)
Hemoglobin: 10.1 g/dL — ABNORMAL LOW (ref 13.0–17.0)
MCH: 32.4 pg (ref 26.0–34.0)
MCHC: 34.4 g/dL (ref 30.0–36.0)
MCV: 94.2 fL (ref 80.0–100.0)
Platelets: 147 K/uL — ABNORMAL LOW (ref 150–400)
RBC: 3.12 MIL/uL — ABNORMAL LOW (ref 4.22–5.81)
RDW: 12.9 % (ref 11.5–15.5)
WBC: 10.2 K/uL (ref 4.0–10.5)
nRBC: 0 % (ref 0.0–0.2)

## 2024-09-10 LAB — HEPARIN LEVEL (UNFRACTIONATED)
Heparin Unfractionated: 0.17 [IU]/mL — ABNORMAL LOW (ref 0.30–0.70)
Heparin Unfractionated: 0.2 [IU]/mL — ABNORMAL LOW (ref 0.30–0.70)
Heparin Unfractionated: 0.22 [IU]/mL — ABNORMAL LOW (ref 0.30–0.70)

## 2024-09-10 LAB — LACTATE DEHYDROGENASE: LDH: 146 U/L (ref 105–235)

## 2024-09-10 LAB — GLUCOSE, CAPILLARY: Glucose-Capillary: 126 mg/dL — ABNORMAL HIGH (ref 70–99)

## 2024-09-10 LAB — MAGNESIUM: Magnesium: 2 mg/dL (ref 1.7–2.4)

## 2024-09-10 MED ORDER — POTASSIUM CHLORIDE 20 MEQ PO PACK
60.0000 meq | PACK | Freq: Once | ORAL | Status: AC
  Administered 2024-09-10: 60 meq via ORAL
  Filled 2024-09-10: qty 3

## 2024-09-10 MED ORDER — POTASSIUM CHLORIDE CRYS ER 20 MEQ PO TBCR: 30.0000 meq | EXTENDED_RELEASE_TABLET | ORAL | Status: DC

## 2024-09-10 NOTE — Progress Notes (Signed)
 PHARMACY - ANTICOAGULATION CONSULT NOTE  Pharmacy Consult for IV heparin  Indication: afib/ Impella 5.5  No Known Allergies  Patient Measurements: Height: 5' 10 (177.8 cm) Weight: 77.9 kg (171 lb 11.8 oz) IBW/kg (Calculated) : 73 HEPARIN  DW (KG): 73.1  Vital Signs: Temp: 98.7 F (37.1 C) (12/14 1940) Temp Source: Oral (12/14 1940) BP: 128/93 (12/14 2200) Pulse Rate: 97 (12/14 1919)  Labs: Recent Labs    09/08/24 0507 09/09/24 0509 09/09/24 1807 09/10/24 0435 09/10/24 1300 09/10/24 2208  HGB 10.6* 10.6*  --  10.1*  --   --   HCT 29.1* 30.0*  --  29.4*  --   --   PLT 143* 153  --  147*  --   --   APTT 49* 55*  --  57*  --   --   HEPARINUNFRC 0.58 0.21*   < > 0.17* 0.20* 0.22*  CREATININE 2.02* 1.87*  --  1.78*  --   --    < > = values in this interval not displayed.    Estimated Creatinine Clearance: 35.3 mL/min (A) (by C-G formula based on SCr of 1.78 mg/dL (H)).   Medical History: Past Medical History:  Diagnosis Date   Atrial fibrillation (HCC)    CHF (congestive heart failure) (HCC)    Cirrhosis (HCC)    ED (erectile dysfunction)    Hyperglycemia    Hyperlipidemia    Hypertension    Internal hemorrhoids    Myocardial infarction (HCC) 2008   SCCA (squamous cell carcinoma) of skin    Left ear.  MOHS surgery 2014 [facility]   Skin cancer of face    2012 basal cell   Thyroid  disease     Medications:  Infusions:   amiodarone  30 mg/hr (09/10/24 2200)   heparin  1,300 Units/hr (09/10/24 2200)   milrinone  0.25 mcg/kg/min (09/10/24 2200)   piperacillin -tazobactam (ZOSYN )  IV 12.5 mL/hr at 09/10/24 2200   sodium bicarbonate  25 mEq (Impella PURGE) in dextrose  5 % 1000 mL bag      Assessment: 78 yo male on apixaban  PTA for afib (last dose 12/7).  Switched to heparin  drip briefly, then held for Impella 5.5 placement.  Pharmacy asked to resume IV heparin  today.  Previously had therapeutic aPTT on 950 units/hr.   12/14 PM update:  Heparin  level  remains sub-therapeutic  Goal of Therapy:  Heparin  level 0.3-0.5 units/mL Monitor platelets by anticoagulation protocol: Yes   Plan:  Increase IV heparin  without bolus to rate of 1450 units/hr - per discussion with AHF, okay to titrate slowly to goal (100-200 units/hr) Heparin  level in 8 hours Heparin  level with AM labs Daily heparin  level and CBC Monitor s/sx bleeding   Lynwood Mckusick, PharmD, BCPS Clinical Pharmacist Phone: [phone]

## 2024-09-10 NOTE — Progress Notes (Signed)
 Patient ID: [name] [name], male   DOB: 25-Feb-1946, 78 y.o.   MRN: [id number]     Advanced Heart Failure Rounding Note  Cardiologist: [name], MD  AHF: Dr. Zenaida  Chief Complaint: A/c Systolic HF Patient Profile   78 y/o male w/ chronic systolic heart failure w/ biventricular dysfunction d/t mixed ischemic/nonischemic CM, CAD, severe functional MR and permanent atrial fibrillation admitted w/ a/c CHF w/ NYHA Class IV symptoms. Now w/ low output. Initial Co-ox 47%. Started on Milrinone .   Subjective:    12/9 S/P Impella 5.5 Placement.   Continues on milrinone  0.25 mcg + amio 30 mg/min.  He is in rate-controlled atrial fibrillation 90s. Co-ox 69%, CVP 6. Creatinine 2.02 => 1.87 => 1.78 today.   Impella 5.5 Flow (Liters/min): 4.4 Liters/min Performance Level: P7 LDH 144 => 142 => 146 Plts 153 => 147 On heparin  gtt  On Zosyn  for prostatitis.    Already walked around the unit this morning. Nausea much better today. Denies CP/SOB.    Objective:    Weight Range: 77.9 kg Body mass index is 24.64 kg/m.   Vital Signs:   Temp:  [97.5 F (36.4 C)-99.5 F (37.5 C)] 97.5 F (36.4 C) (12/14 0700) Pulse Rate:  [73-122] 82 (12/14 0800) Resp:  [12-43] 18 (12/14 0800) BP: (109-145)/(72-110) 140/90 (12/14 0800) SpO2:  [90 %-96 %] 96 % (12/14 0842) Weight:  [77.9 kg] 77.9 kg (12/14 0600) Last BM Date : 09/10/24  Weight change: Filed Weights   09/08/24 0500 09/09/24 0500 09/10/24 0600  Weight: 75.7 kg 76.2 kg 77.9 kg   Intake/Output:  Intake/Output Summary (Last 24 hours) at 09/10/2024 0846 Last data filed at 09/10/2024 0800 Gross per 24 hour  Intake 1898.76 ml  Output 915 ml  Net 983.76 ml    Physical Exam   General: NAD Neck: No JVD, no thyromegaly or thyroid  nodule.  Lungs: Clear to auscultation bilaterally with normal respiratory effort. CV: Nondisplaced PMI.  Heart regular S1/S2, no S3/S4, no murmur.  1+ ankle edema.     Abdomen: Soft, nontender, no  hepatosplenomegaly, no distention.  Skin: Intact without lesions or rashes.  Neurologic: Alert and oriented x 3.  Psych: Normal affect. Extremities: No clubbing or cyanosis.  HEENT: Normal.   Telemetry    A fib 80s-90s (Personally reviewed)    Labs    CBC Recent Labs    09/09/24 0509 09/10/24 0435  WBC 9.8 10.2  HGB 10.6* 10.1*  HCT 30.0* 29.4*  MCV 93.5 94.2  PLT 153 147*   Basic Metabolic Panel Recent Labs    87/86/74 0509 09/10/24 0435  NA 133* 131*  K 3.7 3.5  CL 97* 94*  CO2 29 27  GLUCOSE 85 145*  BUN 20 23  CREATININE 1.87* 1.78*  CALCIUM 7.9* 8.1*  MG 2.2 2.0   Liver Function Tests Recent Labs    09/09/24 0509 09/10/24 0435  AST 20 27  ALT 15 24  ALKPHOS 69 76  BILITOT 1.3* 1.4*  PROT 5.7* 5.5*  ALBUMIN 2.9* 2.7*   BNP (last 3 results) Recent Labs    08/27/24 1907 08/28/24 0204  BNP 1,633.9* 1,952.5*   Medications:    Scheduled Medications:  arformoterol   15 mcg Nebulization BID   Chlorhexidine  Gluconate Cloth  6 each Topical Daily   feeding supplement  237 mL Oral TID BM   levothyroxine   137 mcg Oral QAC breakfast   potassium chloride   60 mEq Oral Once   pravastatin   40 mg Oral  QHS   revefenacin   175 mcg Nebulization Daily   sodium chloride  flush  10-40 mL Intracatheter Q12H     Infusions:  amiodarone  30 mg/hr (09/10/24 0800)   heparin  1,200 Units/hr (09/10/24 0800)   milrinone  0.25 mcg/kg/min (09/10/24 0800)   piperacillin -tazobactam (ZOSYN )  IV 12.5 mL/hr at 09/10/24 0800   sodium bicarbonate  25 mEq (Impella PURGE) in dextrose  5 % 1000 mL bag      PRN Medications: sodium chloride , acetaminophen  **OR** acetaminophen  (TYLENOL ) oral liquid 160 mg/5 mL **OR** acetaminophen , docusate sodium , fentaNYL  (SUBLIMAZE ) injection, ondansetron  (ZOFRAN ) IV, oxyCODONE , polyethylene glycol, sodium chloride  flush  Assessment/Plan   1. Acute on Chronic Systolic Heart Failure w/ Low Output  - mixed ischemic/idiopathic CM  - Echo 2/25: LVEF  25 to 30%, moderate to severely dilated, mildly reduced RV function - Echo 12/1 EF <20%, RV not well visualized  - RHC 12/5 (on milrinone  0.25): Well compensated. mildly elevated L sided filling and PA pressures with mildly reduced CO by TD 4.3/2.3 - Initial co-ox 47%. Started on milrinone  0.25. CO-OX remained suboptimal. Escalation of care 12/9--> MCS Impella 5.5 placed.  - Impella at P7 Flow 4.4.  On heparin  gtt, adjusting dose today.   - Continue milrinone  0.25 mcg/kg/min, co-ox stable at 69%.   - CVP 6. No diuretics today.   - off digoxin  given elevated level and AKI. - other GDMT limited by renal function - Discussed at [facility], limited by renal dysfunction. Creatinine 2.02 => 1.87 => 1.78 today. Working towards LVAD but want to make sure renal function will be adequate. Will watch over weekend and reassess Monday.  Needs to walk in halls regularly, encourage po intake.   2. Permanent Afib  - Continue amiodarone  30 mg/hr. Using for rate control.  - Continue heparin  gtt.    3. Severe MR - functional  - LV cavity more dilated on this admission echo in setting of a/c CHF exacerbation, LVIDs 7.7 cm (6.40 cm)   4. VT/NSVT - in setting of worsening HF, 17 beat run tele 12/2 - increased frequency of NSVT runs  - keep K > 4.0 and Mg > 2.0 - Continue IV amiodarone  at 30 mg/hr.  - he previously declined ICD.    5. CAD - LHC 08/31/2023: LMCA with 20% distal stenosis. LAD with sequential 50% proximal and mid lesions. LCx with 30% mid stenosis, and proximal/mid RCA with 30% disease and 70% stenosis at the ostium of AM1  - continue statin - Has not been on ASA given need for anticoagulation.   6. AKI - b/l SCr 1.4 - bumped to 2.6-->2.5 - Creatinine 2.02 => 1.87 => 1.78 today  7. Hypokalemia - Replace K.   8. H/o Malignant Melanoma - occipital scalp, s/p excision @ [facility] in 2023 - Never followed up for surveillance head CTs  - CT head reassuring  9. Prostatitis - Possible prostate  abscess.  - He is on Zosyn  for 10 day course.   10. Nausea - Amiodarone  decreased 12/11 - Zofran  - Continue Ensure.  - Has been improved on lower amiodarone  dose.   Length of Stay: 13  CRITICAL CARE Performed by: [name]  Total critical care time: 35 minutes  Critical care time was exclusive of separately billable procedures and treating other patients.  Critical care was necessary to treat or prevent imminent or life-threatening deterioration.  Critical care was time spent personally by me on the following activities: development of treatment plan with patient and/or surrogate as well as nursing,  discussions with consultants, evaluation of patient's response to treatment, examination of patient, obtaining history from patient or surrogate, ordering and performing treatments and interventions, ordering and review of laboratory studies, ordering and review of radiographic studies, pulse oximetry and re-evaluation of patient's condition.  [name] 09/10/2024 8:46 AM

## 2024-09-10 NOTE — Progress Notes (Signed)
 PHARMACY - ANTICOAGULATION CONSULT NOTE  Pharmacy Consult for IV heparin  Indication: afib/ Impella 5.5  No Known Allergies  Patient Measurements: Height: 5' 10 (177.8 cm) Weight: 76.2 kg (167 lb 15.9 oz) IBW/kg (Calculated) : 73 HEPARIN  DW (KG): 73.1  Vital Signs: Temp: 98.5 F (36.9 C) (12/14 0400) Temp Source: Oral (12/14 0400) BP: 125/81 (12/14 0400) Pulse Rate: 87 (12/14 0400)  Labs: Recent Labs    09/08/24 0507 09/09/24 0509 09/09/24 1807 09/10/24 0435  HGB 10.6* 10.6*  --  10.1*  HCT 29.1* 30.0*  --  29.4*  PLT 143* 153  --  147*  APTT 49* 55*  --  57*  HEPARINUNFRC 0.58 0.21* 0.19* 0.17*  CREATININE 2.02* 1.87*  --  1.78*    Estimated Creatinine Clearance: 35.3 mL/min (A) (by C-G formula based on SCr of 1.78 mg/dL (H)).   Medical History: Past Medical History:  Diagnosis Date   Atrial fibrillation (HCC)    CHF (congestive heart failure) (HCC)    Cirrhosis (HCC)    ED (erectile dysfunction)    Hyperglycemia    Hyperlipidemia    Hypertension    Internal hemorrhoids    Myocardial infarction (HCC) 2008   SCCA (squamous cell carcinoma) of skin    Left ear.  MOHS surgery 2014 [facility]   Skin cancer of face    2012 basal cell   Thyroid  disease     Medications:  Infusions:   amiodarone  30 mg/hr (09/10/24 0400)   heparin  1,200 Units/hr (09/10/24 0516)   milrinone  0.25 mcg/kg/min (09/10/24 0400)   piperacillin -tazobactam (ZOSYN )  IV Stopped (09/10/24 0232)   sodium bicarbonate  25 mEq (Impella PURGE) in dextrose  5 % 1000 mL bag      Assessment: 78 yo male on apixaban  PTA for afib (last dose 12/7).  Switched to heparin  drip briefly, then held for Impella 5.5 placement.  Pharmacy asked to resume IV heparin  today.  Previously had therapeutic aPTT on 950 units/hr.  09/09/24 pm: Heparin  level 0.19, subtherapeutic at heparin  1000 units/hr. No issues with infusion running or signs of bleeding per RN.  12/14 AM update:  Heparin  level remains  sub-therapeutic Heparin  level and aPTT are correlating-will DC aPTTs  Goal of Therapy:  Heparin  level 0.3-0.5 units/mL Monitor platelets by anticoagulation protocol: Yes   Plan:  Increase IV heparin  without bolus to rate of 1200 units/hr - per discussion with AHF, okay to titrate slowly to goal Heparin  level in 8 hours Heparin  level with AM labs Daily heparin  level and CBC Monitor s/sx bleeding   Lynwood Mckusick, PharmD, BCPS Clinical Pharmacist Phone: [phone]

## 2024-09-10 NOTE — Progress Notes (Signed)
 PHARMACY - ANTICOAGULATION CONSULT NOTE  Pharmacy Consult for IV heparin  Indication: afib/ Impella 5.5  No Known Allergies  Patient Measurements: Height: 5' 10 (177.8 cm) Weight: 77.9 kg (171 lb 11.8 oz) IBW/kg (Calculated) : 73 HEPARIN  DW (KG): 73.1  Vital Signs: Temp: 97.5 F (36.4 C) (12/14 0700) Temp Source: Axillary (12/14 0700) BP: 116/88 (12/14 1400) Pulse Rate: 87 (12/14 1100)  Labs: Recent Labs    09/08/24 0507 09/09/24 0509 09/09/24 1807 09/10/24 0435 09/10/24 1300  HGB 10.6* 10.6*  --  10.1*  --   HCT 29.1* 30.0*  --  29.4*  --   PLT 143* 153  --  147*  --   APTT 49* 55*  --  57*  --   HEPARINUNFRC 0.58 0.21* 0.19* 0.17* 0.20*  CREATININE 2.02* 1.87*  --  1.78*  --     Estimated Creatinine Clearance: 35.3 mL/min (A) (by C-G formula based on SCr of 1.78 mg/dL (H)).   Medical History: Past Medical History:  Diagnosis Date   Atrial fibrillation (HCC)    CHF (congestive heart failure) (HCC)    Cirrhosis (HCC)    ED (erectile dysfunction)    Hyperglycemia    Hyperlipidemia    Hypertension    Internal hemorrhoids    Myocardial infarction (HCC) 2008   SCCA (squamous cell carcinoma) of skin    Left ear.  MOHS surgery 2014 [facility]   Skin cancer of face    2012 basal cell   Thyroid  disease     Medications:  Infusions:   amiodarone  30 mg/hr (09/10/24 1400)   heparin  1,200 Units/hr (09/10/24 1400)   milrinone  0.25 mcg/kg/min (09/10/24 1412)   piperacillin -tazobactam (ZOSYN )  IV 3.375 g (09/10/24 1415)   sodium bicarbonate  25 mEq (Impella PURGE) in dextrose  5 % 1000 mL bag      Assessment: 78 yo male on apixaban  PTA for afib (last dose 12/7).  Switched to heparin  drip briefly, then held for Impella 5.5 placement.  Pharmacy asked to resume IV heparin  today.  Previously had therapeutic aPTT on 950 units/hr.  09/10/24 1500: Heparin  level 0.20, remains subtherapeutic at heparin  1200 units/hr. No issues with infusion running noted. Per RN,  patient when new hemorrhoid bleeding, not severe. No other signs of bleeding noted. CBC stable (Hgb 10.1, PLT 147).   Goal of Therapy:  Heparin  level 0.3-0.5 units/mL Monitor platelets by anticoagulation protocol: Yes   Plan:  Increase IV heparin  without bolus to rate of 1300 units/hr - per discussion with AHF, okay to titrate by 100-200 units/hr Heparin  level in 8 hours Daily heparin  level and CBC Monitor s/sx bleeding   Morna Breach, PharmD, BCPS PGY2 Cardiology Pharmacy Resident 09/10/2024 2:59 PM

## 2024-09-10 NOTE — Progress Notes (Signed)
 Foley replaced with a 16 fr foley. Per MD order. urine retrieved from bladder. [name] [name] assisted with procedure. 10 ml instill in balloon.

## 2024-09-10 NOTE — Progress Notes (Signed)
 Pt urine output about 10 ph x3h. Urine leakage around foley x2. Bladder scan shows in bladder. Pt comfortable. Dr Debarah notified. See orders.

## 2024-09-10 NOTE — Plan of Care (Signed)
°  Problem: Clinical Measurements: Goal: Respiratory complications will improve Outcome: Progressing   Problem: Clinical Measurements: Goal: Cardiovascular complication will be avoided Outcome: Progressing   Problem: Clinical Measurements: Goal: Diagnostic test results will improve Outcome: Progressing   Problem: Elimination: Goal: Will not experience complications related to bowel motility Outcome: Progressing   Problem: Pain Managment: Goal: General experience of comfort will improve and/or be controlled Outcome: Progressing   Problem: Activity: Goal: Ability to return to baseline activity level will improve Outcome: Progressing   Problem: Education: Goal: Knowledge of General Education information will improve Description: Including pain rating scale, medication(s)/side effects and non-pharmacologic comfort measures Outcome: Progressing

## 2024-09-11 ENCOUNTER — Inpatient Hospital Stay (HOSPITAL_COMMUNITY)

## 2024-09-11 DIAGNOSIS — R0603 Acute respiratory distress: Secondary | ICD-10-CM | POA: Diagnosis not present

## 2024-09-11 DIAGNOSIS — Z95811 Presence of heart assist device: Secondary | ICD-10-CM

## 2024-09-11 DIAGNOSIS — I130 Hypertensive heart and chronic kidney disease with heart failure and stage 1 through stage 4 chronic kidney disease, or unspecified chronic kidney disease: Principal | ICD-10-CM

## 2024-09-11 DIAGNOSIS — I5082 Biventricular heart failure: Secondary | ICD-10-CM

## 2024-09-11 DIAGNOSIS — I2510 Atherosclerotic heart disease of native coronary artery without angina pectoris: Secondary | ICD-10-CM

## 2024-09-11 DIAGNOSIS — I5023 Acute on chronic systolic (congestive) heart failure: Secondary | ICD-10-CM | POA: Diagnosis not present

## 2024-09-11 DIAGNOSIS — I4729 Other ventricular tachycardia: Secondary | ICD-10-CM

## 2024-09-11 LAB — BASIC METABOLIC PANEL WITH GFR
Anion gap: 11 (ref 5–15)
Anion gap: 13 (ref 5–15)
BUN: 19 mg/dL (ref 8–23)
BUN: 21 mg/dL (ref 8–23)
CO2: 28 mmol/L (ref 22–32)
CO2: 23 mmol/L (ref 22–32)
Calcium: 8.3 mg/dL — ABNORMAL LOW (ref 8.9–10.3)
Calcium: 8.3 mg/dL — ABNORMAL LOW (ref 8.9–10.3)
Chloride: 94 mmol/L — ABNORMAL LOW (ref 98–111)
Chloride: 94 mmol/L — ABNORMAL LOW (ref 98–111)
Creatinine, Ser: 1.83 mg/dL — ABNORMAL HIGH (ref 0.61–1.24)
Creatinine, Ser: 1.95 mg/dL — ABNORMAL HIGH (ref 0.61–1.24)
GFR, Estimated: 37 mL/min — ABNORMAL LOW (ref >60)
GFR, Estimated: 35 mL/min — ABNORMAL LOW (ref >60)
Glucose, Bld: 207 mg/dL — ABNORMAL HIGH (ref 70–99)
Glucose, Bld: 254 mg/dL — ABNORMAL HIGH (ref 70–99)
Potassium: 3.4 mmol/L — ABNORMAL LOW (ref 3.5–5.1)
Potassium: 5.3 mmol/L — ABNORMAL HIGH (ref 3.5–5.1)
Sodium: 133 mmol/L — ABNORMAL LOW (ref 135–145)
Sodium: 130 mmol/L — ABNORMAL LOW (ref 135–145)

## 2024-09-11 LAB — MAGNESIUM
Magnesium: 1.9 mg/dL (ref 1.7–2.4)
Magnesium: 2.4 mg/dL (ref 1.7–2.4)

## 2024-09-11 LAB — COMPREHENSIVE METABOLIC PANEL WITH GFR
ALT: 34 U/L (ref 0–44)
AST: 34 U/L (ref 15–41)
Albumin: 3 g/dL — ABNORMAL LOW (ref 3.5–5.0)
Alkaline Phosphatase: 80 U/L (ref 38–126)
Anion gap: 7 (ref 5–15)
BUN: 18 mg/dL (ref 8–23)
CO2: 28 mmol/L (ref 22–32)
Calcium: 8.3 mg/dL — ABNORMAL LOW (ref 8.9–10.3)
Chloride: 98 mmol/L (ref 98–111)
Creatinine, Ser: 1.78 mg/dL — ABNORMAL HIGH (ref 0.61–1.24)
GFR, Estimated: 39 mL/min — ABNORMAL LOW (ref >60)
Glucose, Bld: 95 mg/dL (ref 70–99)
Potassium: 4.1 mmol/L (ref 3.5–5.1)
Sodium: 133 mmol/L — ABNORMAL LOW (ref 135–145)
Total Bilirubin: 1.6 mg/dL — ABNORMAL HIGH (ref 0.0–1.2)
Total Protein: 5.9 g/dL — ABNORMAL LOW (ref 6.5–8.1)

## 2024-09-11 LAB — COOXEMETRY PANEL
Carboxyhemoglobin: 1.7 % — ABNORMAL HIGH (ref 0.5–1.5)
Carboxyhemoglobin: 1.4 % (ref 0.5–1.5)
Methemoglobin: 0.8 % (ref 0.0–1.5)
Methemoglobin: <0.7 % (ref 0.0–1.5)
O2 Saturation: 68.3 %
O2 Saturation: 46 %
Total hemoglobin: 10.9 g/dL — ABNORMAL LOW (ref 12.0–16.0)
Total hemoglobin: 13.1 g/dL (ref 12.0–16.0)

## 2024-09-11 LAB — CG4 I-STAT (LACTIC ACID)
Lactic Acid, Venous: 2.5 mmol/L (ref 0.5–1.9)
Lactic Acid, Venous: 4.7 mmol/L (ref 0.5–1.9)

## 2024-09-11 LAB — PREALBUMIN: Prealbumin: 19 mg/dL (ref 18–38)

## 2024-09-11 LAB — CBC
HCT: 38.1 % — ABNORMAL LOW (ref 39.0–52.0)
Hemoglobin: 13.3 g/dL (ref 13.0–17.0)
MCH: 32.6 pg (ref 26.0–34.0)
MCHC: 34.9 g/dL (ref 30.0–36.0)
MCV: 93.4 fL (ref 80.0–100.0)
Platelets: 213 K/uL (ref 150–400)
RBC: 4.08 MIL/uL — ABNORMAL LOW (ref 4.22–5.81)
RDW: 13.2 % (ref 11.5–15.5)
WBC: 19.2 K/uL — ABNORMAL HIGH (ref 4.0–10.5)
nRBC: 0 % (ref 0.0–0.2)

## 2024-09-11 LAB — LACTATE DEHYDROGENASE: LDH: 168 U/L (ref 105–235)

## 2024-09-11 LAB — HEPARIN LEVEL (UNFRACTIONATED): Heparin Unfractionated: 0.39 [IU]/mL (ref 0.30–0.70)

## 2024-09-11 MED ORDER — EPINEPHRINE HCL 5 MG/250ML IV SOLN IN NS
1.0000 ug/min | INTRAVENOUS | Status: DC
  Administered 2024-09-11: 18:00:00 1 ug/min via INTRAVENOUS

## 2024-09-11 MED ORDER — FENTANYL CITRATE (PF) 50 MCG/ML IJ SOSY
25.0000 ug | PREFILLED_SYRINGE | Freq: Once | INTRAMUSCULAR | Status: AC
  Administered 2024-09-11: 19:00:00 25 ug via INTRAVENOUS

## 2024-09-11 MED ORDER — AMIODARONE LOAD VIA INFUSION
150.0000 mg | Freq: Once | INTRAVENOUS | Status: AC
  Administered 2024-09-11: 18:00:00 150 mg via INTRAVENOUS

## 2024-09-11 MED ORDER — MIRTAZAPINE 15 MG PO TABS: 15.0000 mg | ORAL_TABLET | Freq: Every day | ORAL | Status: DC

## 2024-09-11 MED ORDER — HYDROMORPHONE HCL-NACL 50-0.9 MG/50ML-% IV SOLN
5.0000 mg/h | INTRAVENOUS | Status: DC
  Administered 2024-09-11: 21:00:00 2 mg/h via INTRAVENOUS
  Filled 2024-09-11: qty 50

## 2024-09-11 MED ORDER — MELATONIN 3 MG PO TABS
6.0000 mg | ORAL_TABLET | Freq: Every evening | ORAL | Status: DC | PRN
  Administered 2024-09-11: 01:00:00 6 mg via ORAL
  Filled 2024-09-11: qty 2

## 2024-09-11 MED ORDER — ACETAMINOPHEN 650 MG RE SUPP: 650.0000 mg | Freq: Four times a day (QID) | RECTAL | Status: DC | PRN

## 2024-09-11 MED ORDER — GLYCOPYRROLATE 0.2 MG/ML IJ SOLN: 0.2000 mg | INTRAMUSCULAR | Status: DC | PRN

## 2024-09-11 MED ORDER — EPINEPHRINE HCL 5 MG/250ML IV SOLN IN NS
INTRAVENOUS | Status: AC
  Filled 2024-09-11: qty 250

## 2024-09-11 MED ORDER — ORAL CARE MOUTH RINSE: 15.0000 mL | OROMUCOSAL | Status: DC

## 2024-09-11 MED ORDER — ACETAMINOPHEN 325 MG PO TABS: 650.0000 mg | ORAL_TABLET | Freq: Four times a day (QID) | ORAL | Status: DC | PRN

## 2024-09-11 MED ORDER — SODIUM CHLORIDE 0.9 % IV SOLN: INTRAVENOUS | Status: DC

## 2024-09-11 MED ORDER — ORAL CARE MOUTH RINSE: 15.0000 mL | OROMUCOSAL | Status: DC | PRN

## 2024-09-11 MED ORDER — MAGNESIUM SULFATE 2 GM/50ML IV SOLN
2.0000 g | Freq: Once | INTRAVENOUS | Status: AC
  Administered 2024-09-11: 10:00:00 2 g via INTRAVENOUS
  Filled 2024-09-11: qty 50

## 2024-09-11 MED ORDER — POTASSIUM CHLORIDE 20 MEQ PO PACK
60.0000 meq | PACK | Freq: Once | ORAL | Status: AC
  Administered 2024-09-11: 16:00:00 60 meq via ORAL
  Filled 2024-09-11: qty 3

## 2024-09-11 MED ORDER — HYDROMORPHONE BOLUS VIA INFUSION
5.0000 mg | INTRAVENOUS | Status: DC | PRN
  Administered 2024-09-11: 21:00:00 5 mg via INTRAVENOUS

## 2024-09-11 MED ORDER — POLYVINYL ALCOHOL 1.4 % OP SOLN: 1.0000 [drp] | Freq: Four times a day (QID) | OPHTHALMIC | Status: DC | PRN

## 2024-09-11 MED ORDER — FUROSEMIDE 10 MG/ML IJ SOLN
60.0000 mg | Freq: Once | INTRAMUSCULAR | Status: AC
  Administered 2024-09-11: 10:00:00 60 mg via INTRAVENOUS
  Filled 2024-09-11: qty 6

## 2024-09-11 MED ORDER — PERFLUTREN LIPID MICROSPHERE
1.0000 mL | INTRAVENOUS | Status: DC | PRN
  Administered 2024-08-28: 09:00:00 2 mL via INTRAVENOUS

## 2024-09-11 MED ORDER — HYDROMORPHONE BOLUS VIA INFUSION
5.0000 mg | Freq: Once | INTRAVENOUS | Status: AC
  Administered 2024-09-11: 21:00:00 5 mg via INTRAVENOUS
  Filled 2024-09-11: qty 5

## 2024-09-11 MED ORDER — MIDAZOLAM-SODIUM CHLORIDE 100-0.9 MG/100ML-% IV SOLN
5.0000 mg/h | INTRAVENOUS | Status: DC
  Administered 2024-09-11: 21:00:00 5 mg/h via INTRAVENOUS
  Filled 2024-09-11: qty 100

## 2024-09-11 MED ORDER — GLYCOPYRROLATE 1 MG PO TABS: 1.0000 mg | ORAL_TABLET | ORAL | Status: DC | PRN

## 2024-09-11 MED ORDER — MIDAZOLAM HCL (PF) 2 MG/2ML IJ SOLN
5.0000 mg | Freq: Once | INTRAMUSCULAR | Status: AC
  Administered 2024-09-11: 21:00:00 5 mg via INTRAVENOUS

## 2024-09-11 MED ORDER — GLYCOPYRROLATE 0.2 MG/ML IJ SOLN
0.2000 mg | INTRAMUSCULAR | Status: DC | PRN
  Administered 2024-09-11: 21:00:00 0.2 mg via INTRAVENOUS
  Filled 2024-09-11: qty 1

## 2024-09-11 MED ORDER — MIDAZOLAM BOLUS VIA INFUSION
5.0000 mg | Freq: Once | INTRAVENOUS | Status: AC
  Administered 2024-09-11: 21:00:00 5 mg via INTRAVENOUS
  Filled 2024-09-11: qty 5

## 2024-09-11 MED ORDER — DEXMEDETOMIDINE HCL IN NACL 400 MCG/100ML IV SOLN
INTRAVENOUS | Status: AC
  Filled 2024-09-11: qty 100

## 2024-09-11 MED ORDER — DEXMEDETOMIDINE HCL IN NACL 400 MCG/100ML IV SOLN
0.0000 ug/kg/h | INTRAVENOUS | Status: DC
  Administered 2024-09-11: 19:00:00 0.4 ug/kg/h via INTRAVENOUS

## 2024-09-13 ENCOUNTER — Telehealth: Payer: Self-pay | Admitting: Family Medicine

## 2024-09-13 NOTE — Telephone Encounter (Signed)
 Called his wife, gave condolences.  I was always glad to see this gentleman and he will be missed.  She thanked me for the call.

## 2024-09-28 NOTE — Progress Notes (Signed)
° ° °  Comfort drips started at 2049.   Inhaled nitric stopped. Bipap removed personally with the help of RT..   Amiodarone , epi and milrinone  drips stopped.   Impella turned down to P-1.   Patient quickly sedated but was extremely air hungry and family was concerned over him suffering.   Drips rapidly titrated. To ensure comfort. Robinol given for secretions and airway personally suctioned.   After about 25 minutes we were able to get patient's air hunger controlled.  Patient passed quietly at 2153 with family at bedside. Chaplain visited with family.   Additional 40 mins CCT  Toribio Fuel, MD  12:08 AM

## 2024-09-28 NOTE — TOC Progression Note (Signed)
 Transition of Care [facility]) - Progression Note    Patient Details  Name: [name] [name] MRN: [id number] Date of Birth: 03/22/1946  Transition of Care [facility]) CM/SW Contact  Justina Delcia Czar, RN Phone Number: [phone] 09/21/24, 2:32 PM  Clinical Narrative:    LVAD workup ongoing. Pt remains on Milrinone  and amiodarone  gtt.    Chart reviewed for discharge readiness, patient not medically stable for d/c. Inpatient CM/CSW will continue to monitor pt's advancement through interdisciplinary progression rounds.    If new pt transition needs arise, MD please place a [facility] consult   Expected Discharge Plan: Home w Home Health Services Barriers to Discharge: Continued Medical Work up    Expected Discharge Plan and Services   Discharge Planning Services: CM Consult      Social Drivers of Health (SDOH) Interventions SDOH Screenings   Food Insecurity: No Food Insecurity (08/28/2024)  Housing: Low Risk (08/28/2024)  Transportation Needs: No Transportation Needs (08/28/2024)  Utilities: Not At Risk (08/28/2024)  Alcohol  Screen: Low Risk (01/04/2024)  Depression (PHQ2-9): Low Risk (01/06/2024)  Financial Resource Strain: Low Risk (01/04/2024)  Physical Activity: Inactive (01/04/2024)  Social Connections: Moderately Integrated (08/28/2024)  Stress: No Stress Concern Present (01/04/2024)  Tobacco Use: Medium Risk (09/05/2024)  Health Literacy: Adequate Health Literacy (01/04/2024)    Readmission Risk Interventions    08/28/2024    4:12 PM  Readmission Risk Prevention Plan  Transportation Screening Complete  PCP or Specialist Appt within 5-7 Days Complete  Home Care Screening Complete  Medication Review (RN CM) Complete

## 2024-09-28 NOTE — Progress Notes (Signed)
 NAME:  [name] [name], MRN:  [id number], DOB:  [birth year], LOS: 14 ADMISSION DATE:  08/27/2024, CONSULTATION DATE:  12/9 REFERRING MD:  Zenaida, AHF CHIEF COMPLAINT: biventricular heart failure    History of Present Illness:  79 year old male with past medical history of hypothyroidism, hypertension, hyperlipidemia, paroxysmal atrial fibrillation on Eliquis , chronic biventricular heart failure, ischemic cardiomyopathy, CAD, severe functional MR who was initially admitted on 12/1 with shortness of breath.  Prior to arrival had 3 days of worsening shortness of breath, cough, peripheral edema.  In the ED was in A-fib RVR with rates up to 160s, K5.5, bicarb 19, SCR 1.49, BNP 1633, troponin 11> 15, lactic 2.  Chest x-ray with vascular congestion.  He was admitted to hospitalist and started on diuresis.  Cardiology and heart failure saw patient on 12/2.  PICC placement was ordered for Co. ox assessment, plan for RHC once stabilized.  On 12/3 after PICC was placed, Co. ox 49, Fick CI 1.02.  He was started on milrinone  0.25, continued on Lasix , Diamox , started on IV Amio.  He had a right heart cath on 12/5, T CTS consult for bad workup.  While on milrinone  had brief improvement in Co. ox until 12/8 when he had drop in [place] to 44%, weight up 5 pounds, increased A-fib rates and was given diuresis.  TCTS was asked for Impella 5.5 placement consult.  Impella was placed on 12/9 and patient was taken to ICU postoperatively.  CCM consulted for help with medical management.  Pertinent  Medical History  hypothyroidism, hypertension, hyperlipidemia, paroxysmal atrial fibrillation on Eliquis , chronic biventricular heart failure, ischemic cardiomyopathy, CAD, severe functional MR  Significant Hospital Events: Including procedures, antibiotic start and stop dates in addition to other pertinent events   12/1: admit for AoC systolic heart failure, started on diuresis  12/2: cardiology and AHF consults  12/3: PICC placed,  coox 49, CI 1.02 > milrinone , ongoing diuresis  12/5: RHC RA 4, RV 39/5, PA 37/24(28), PCW 20, Fick CI 2.4, PAPi 3.25 on milrinone  0.25; TCTS consult for VAD work up  12/8: coox 44% dropped back off, weight up 5lb, afib rate up, given diuresis; consult to TCTS for impella 5.5 placement  12/9: went for impella 5.5>ICU post-op, CCM consult 12/9: Extubated  12/10: Heparin  started  Interim History / Subjective:  Called back for increased WOB Diuresing well, walking hallways this evening abrupt onset worsening SOB, increased CVP despite diuresis.  Objective   Blood pressure (!) 118/96, pulse 70, temperature (!) 97.5 F (36.4 C), temperature source Oral, resp. rate 17, height 5' 10 (1.778 m), weight 74.7 kg, SpO2 90%. CVP:  [2 mmHg-23 mmHg] 17 mmHg      Intake/Output Summary (Last 24 hours) at September 13, 2024 1832 Last data filed at September 13, 2024 1817 Gross per 24 hour  Intake 2369.83 ml  Output 3870 ml  Net -1500.17 ml   Filed Weights   09/09/24 0500 09/10/24 0600 09-13-24 0900  Weight: 76.2 kg 77.9 kg 74.7 kg    amiodarone  30 mg/hr (13-Sep-2024 1817)   epinephrine  1 mcg/min (Sep 13, 2024 1824)   heparin  1,450 Units/hr (2024/09/13 1825)   milrinone  0.25 mcg/kg/min (Sep 13, 2024 1817)   piperacillin -tazobactam (ZOSYN )  IV 12.5 mL/hr at September 13, 2024 1817   sodium bicarbonate  25 mEq (Impella PURGE) in dextrose  5 % 1000 mL bag       Examination: Tachypneic  Ext lukewarm RV looks more down on bedside, impella position looks okay Aox3, pursed lip breathing Lungs clear  CXR unchanged Lactate 2.4 CBC, coox  pending CVP 15-20  Resolved Hospital Problem list    Assessment & Plan:   This is a 79 year old male with a past medical history of CAD, CKD stage IIIa, hypothyroidism, mitral regurgitation, hypertension who presented to the emergency department on 12/1 with concerns of shortness of breath.  Patient found to have biventricular heart failure.  He worsened during his hospital course requiring Impella  placement on 09/05/2024.  Acute on chronic biventricular heart failure  Cardiogenic shock s/p Impella 5.5 placement  Permanent afib  Severe MR  VT/NSVT  CAD  LVAD work up  AKI on CKD  New- worsening SOB, rising CVP, worse bedside RV function abrupt onset; CXR unchanged; has been on heparin  therapeutic by ptt but not by heparin  level  Epi started BIPAP + iNO to offload RV AHF to come see, if can get to steady spot would CTA and consider catheter-directed mechanical thrombectomy if positive He looks much worse than this am  32 min cc time Rolan Sharps MD PCCM

## 2024-09-28 NOTE — Progress Notes (Signed)
 Midazolam  infusion started at 2049; hydromorphone  infusion started at 2050. Boluses of both medications ordered by Dr. Bensimhon at bedside. Family educated and agrees to boluses. Other infusions discontinued per Dr. Nelle direction at bedside. Impella turned to P2 by Dr. Bensimhon. Hydromorphone  infusion increased to 5mg /hr per Dr. Bensimhon at bedside.

## 2024-09-28 NOTE — Plan of Care (Signed)
°  Problem: Education: Goal: Knowledge of General Education information will improve Description: Including pain rating scale, medication(s)/side effects and non-pharmacologic comfort measures Outcome: Progressing   Problem: Health Behavior/Discharge Planning: Goal: Ability to manage health-related needs will improve Outcome: Progressing   Problem: Clinical Measurements: Goal: Ability to maintain clinical measurements within normal limits will improve Outcome: Progressing Goal: Will remain free from infection Outcome: Progressing Goal: Diagnostic test results will improve Outcome: Progressing Goal: Respiratory complications will improve Outcome: Progressing Goal: Cardiovascular complication will be avoided Outcome: Progressing   Problem: Activity: Goal: Risk for activity intolerance will decrease Outcome: Progressing   Problem: Nutrition: Goal: Adequate nutrition will be maintained Outcome: Progressing   Problem: Coping: Goal: Level of anxiety will decrease Outcome: Progressing   Problem: Elimination: Goal: Will not experience complications related to bowel motility Outcome: Progressing Goal: Will not experience complications related to urinary retention Outcome: Progressing   Problem: Pain Managment: Goal: General experience of comfort will improve and/or be controlled Outcome: Progressing   Problem: Safety: Goal: Ability to remain free from injury will improve Outcome: Progressing   Problem: Skin Integrity: Goal: Risk for impaired skin integrity will decrease Outcome: Progressing   Problem: Education: Goal: Understanding of CV disease, CV risk reduction, and recovery process will improve Outcome: Progressing Goal: Individualized Educational Video(s) Outcome: Progressing   Problem: Activity: Goal: Ability to return to baseline activity level will improve Outcome: Progressing   Problem: Cardiovascular: Goal: Ability to achieve and maintain adequate  cardiovascular perfusion will improve Outcome: Progressing Goal: Vascular access site(s) Level 0-1 will be maintained Outcome: Progressing   Problem: Health Behavior/Discharge Planning: Goal: Ability to safely manage health-related needs after discharge will improve Outcome: Progressing   Problem: Cardiac: Goal: Ability to achieve and maintain adequate cardiopulmonary perfusion will improve Outcome: Progressing Goal: Vascular access site(s) Level 0-1 will be maintained Outcome: Progressing   Problem: Fluid Volume: Goal: Ability to achieve a balanced intake and output will improve Outcome: Progressing   Problem: Physical Regulation: Goal: Complications related to the disease process, condition or treatment will be avoided or minimized Outcome: Progressing   Problem: Respiratory: Goal: Will regain and/or maintain adequate ventilation Outcome: Progressing   Problem: Education: Goal: Ability to describe self-care measures that may prevent or decrease complications (Diabetes Survival Skills Education) will improve Outcome: Progressing Goal: Individualized Educational Video(s) Outcome: Progressing   Problem: Coping: Goal: Ability to adjust to condition or change in health will improve Outcome: Progressing   Problem: Fluid Volume: Goal: Ability to maintain a balanced intake and output will improve Outcome: Progressing   Problem: Health Behavior/Discharge Planning: Goal: Ability to identify and utilize available resources and services will improve Outcome: Progressing Goal: Ability to manage health-related needs will improve Outcome: Progressing   Problem: Metabolic: Goal: Ability to maintain appropriate glucose levels will improve Outcome: Progressing   Problem: Nutritional: Goal: Maintenance of adequate nutrition will improve Outcome: Progressing Goal: Progress toward achieving an optimal weight will improve Outcome: Progressing   Problem: Skin Integrity: Goal:  Risk for impaired skin integrity will decrease Outcome: Progressing   Problem: Tissue Perfusion: Goal: Adequacy of tissue perfusion will improve Outcome: Progressing

## 2024-09-28 NOTE — Progress Notes (Signed)
° °  Patient developed acute respiratory distress this afternoon.  Now markedly SOB and requiring Bipap.   Echo done personally at bedside and reviewed with CCM. Shows severely dysfunctional RV (new).  Co-ox dropping with rising lactate despite addition of epi. HR 140-150s   On exam Sitting straight up in bed. Marked respiratory distress On Bipap + tachypneic. Coarse lung sound Tachy irregular Ext cool   He almost certainly has massive PE. I have discussed case with Drs. Rolan Brownie, Su and [place].   We discussed option of intubation with emergent CT and possible IR thrombectomy but I am not sure he can survive this and even if we were successful with thrombectomy would no longer be VAD candidate. Not candidate for systemic t-PA with recent 5.5 placement.   I have discussed with patient and family. They are clear about desire for comfort care.   Will start comfort drips urgently as he is quite uncomfortable.   CCT 120 mins.   Toribio Fuel, MD  8:43 PM

## 2024-09-28 NOTE — Progress Notes (Signed)
 PHARMACY - ANTICOAGULATION CONSULT NOTE  Pharmacy Consult for IV heparin  Indication: afib/ Impella 5.5  No Known Allergies  Patient Measurements: Height: 5' 10 (177.8 cm) Weight: 74.7 kg (164 lb 10.9 oz) IBW/kg (Calculated) : 73 HEPARIN  DW (KG): 73.1  Vital Signs: Temp: 98.5 F (36.9 C) 09-12-2024 0815) Temp Source: Oral 09-12-2024 0815) BP: 138/87 (12/15 0900) Pulse Rate: 74 09-12-2024 0900)  Labs: Recent Labs    09/09/24 0509 09/09/24 1807 09/10/24 0435 09/10/24 1300 09/10/24 2208 2024-09-12 0523  HGB 10.6*  --  10.1*  --   --   --   HCT 30.0*  --  29.4*  --   --   --   PLT 153  --  147*  --   --   --   APTT 55*  --  57*  --   --   --   HEPARINUNFRC 0.21*   < > 0.17* 0.20* 0.22*  --   CREATININE 1.87*  --  1.78*  --   --  1.78*   < > = values in this interval not displayed.    Estimated Creatinine Clearance: 35.3 mL/min (A) (by C-G formula based on SCr of 1.78 mg/dL (H)).   Medical History: Past Medical History:  Diagnosis Date   Atrial fibrillation (HCC)    CHF (congestive heart failure) (HCC)    Cirrhosis (HCC)    ED (erectile dysfunction)    Hyperglycemia    Hyperlipidemia    Hypertension    Internal hemorrhoids    Myocardial infarction (HCC) 2008   SCCA (squamous cell carcinoma) of skin    Left ear.  MOHS surgery 2014 [facility]   Skin cancer of face    2012 basal cell   Thyroid  disease     Medications:  Infusions:   amiodarone  30 mg/hr (2024-09-12 0700)   heparin  1,450 Units/hr (2024/09/12 0700)   magnesium  sulfate bolus IVPB     milrinone  0.25 mcg/kg/min (Sep 12, 2024 0700)   piperacillin -tazobactam (ZOSYN )  IV 12.5 mL/hr at 2024/09/12 0700   sodium bicarbonate  25 mEq (Impella PURGE) in dextrose  5 % 1000 mL bag      Assessment: 79 yo male on apixaban  PTA for afib (last dose 12/7).  Switched to heparin  drip briefly, then held for Impella 5.5 placement.  Pharmacy asked to resume IV heparin  today.  Previously had therapeutic aPTT on 950 units/hr.  09-12-24  AM: Heparin  level 0.39 therapeutic now on 1450 units/hr.  Impella 5.5 at P7.  No CBC today - ordered but not collected.  MRB planning to meet Tues to re-evaluate for LVAD candidacy.   Goal of Therapy:  Heparin  level 0.3-0.5 units/mL Monitor platelets by anticoagulation protocol: Yes   Plan:  Continue Heparin  IV 1450 units/hr Daily heparin  level and CBC Monitor s/sx bleeding   Maurilio Fila, PharmD Clinical Pharmacist 2024-09-12  9:19 AM

## 2024-09-28 NOTE — Death Summary Note (Cosign Needed Addendum)
°  Advanced Heart Failure Death Summary  Death Summary   Patient ID: [name] [name] MRN: [id number], DOB/AGE: [birth year] 79 y.o. Admit date: 08/27/2024 D/C date:     09/12/2024   Primary Discharge Diagnoses:  A/C systolic HF with low output Permanent a fib Severe MR VT/NSVT CAD AKI Hypokalemia Hypomagnesemia H/o malignant melanoma Prostatitis Nausea   Hospital Course:  [name] [name] is a 79 y/o male w/ chronic systolic heart failure w/ biventricular dysfunction d/t mixed ischemic/nonischemic CM, CAD, severe functional MR and permanent atrial fibrillation.   Admitted w/ a/c CHF w/ NYHA Class IV symptoms. Initially with poor response to high dose diuretics and worsening SCr, concern for low output HF, AHF team was consulted. Noted to be in WCT earlier during admission, EP consulted and determined to be a fib. PICC placed, initial co-ox was 49% and calcQx Fick 1.02. Started on milrinone . Diuresed well with inotrope support and IV lasix  initially but ended up needing further support to fully offload/optimize. TCTS, LVAD coordinators and palliative team all consulted to begin work up for LVAD. A fib with intt rates up to 150-170s with activity, this was treated with amiodarone  gtt and hep gtt. Urology was consulted for incidental finding of 1.8 cm abscess vs mass in L lobe of prostate. Prostatitis treated with abx.   Impella 5.5 was placed 09/05/24 for additional support and optimization. Responded very well to this and felt great after placement. Diuresed well with IV lasix  intt. Discussed at [facility] 12/10, decision made to reevaluate in upcoming days with elevated renal function.  Overall had been doing very well since then. He was ambulating in the halls. He really only felt bad when his a fib was briefly uncontrolled but quickly resettled and he would be asymptomatic after. Required diuretics PRN. Renal function mostly stable.  Plan was to discuss him again at [facility] morning of 12/16.  Unfortunately  the evening of 09-29-2024 after going for a walk and eating his dinner he developed acute respiratory distress requiring BiPAP +iNO for RV offload. CVP went from 4 to 15/16. Echo at the bedside showed new severe RV dysfunction. Co-ox dropped and lactic acid trended up despite addition of Epi. HR also elevated to 150s. Suspected to have massive PE, too unstable to transport to imaging and not a candidate for systolic tPA with recent 5.5 placement. Plan discussed with family who opted for comfort care. Comfort drips started with family at the bedside. MCS and inotropes/pressors turned down. Patient passed peacefully at 2153 with family at bedside.   Significant Diagnostic Studies - Echo 12/1: EF < 20%, global HK, RV not well visualized, severe BAE, mitral valve not well visualized. LV dimensions noted to be larger compared to prior study, LVIDs 7.71 cm (6.40 cm).  - 12/4 dopplers: L/R carotids consistent with CIA 1-39% stenosis. R/L ABI WNL - 12/4 BLE dopplers: (-) DVT - RHC 12/5: with well compensated hemodynamics on milrinone  .25. RA 4, PA 37/24 (28) PCW 20, Fick CO/CI 4.4/2.4, PAPi 3.25 - Impella 5.5 placement 09/05/24 - Echo 12/9: EF <20%. Impella stable - Echo 12/10: EF <20%, RV mildly reduced, LA/RA severely dilated - September 29, 2024. Bedside echo with severe RV dysfunction.   Consultations  TCTS Palliative Urology PCCM EP Cardiology   APP Duration of Discharge Encounter: 15 minutes   Signed, Beckey LITTIE Coe, NP 09/12/2024, 9:54 AM

## 2024-09-28 NOTE — Progress Notes (Signed)
 Orthopedic Tech Progress Note Patient Details:  [name] [name] June 06, 1946 [id number]  Ortho Devices Type of Ortho Device: Ace wrap, Unna boot Ortho Device/Splint Location: BLE Ortho Device/Splint Interventions: Ordered, Application, Adjustment   Post Interventions Patient Tolerated: Well Instructions Provided: Care of device  [name] Pac Oct 09, 2024, 10:59 AM

## 2024-09-28 NOTE — Progress Notes (Signed)
 Brief MCS Note:  VAD Coordinator met with pt at bedside. Pt POD6 post Impella 5.5 placement. States he is feeling better with the Impella. He has been ambulating in the hall. Pt has been connected with another [facility] VAD patient to discuss life with VAD therapy. Pt has no questions or concerns at this time. Cr trending down. Plan for MRB tomorrow for finalized decision on VAD.   Schuyler Lunger RN, BSN VAD Coordinator 24/7 Pager [phone]

## 2024-09-28 NOTE — Progress Notes (Signed)
°   2024/09/28 2130  Spiritual Encounters  Type of Visit Initial  Care provided to: Pt and family  Reason for visit End-of-life  OnCall Visit Yes   Responded to call for chaplain to provided support. Patient moved to comfort care and family request the presence of a chaplain as the patient transitioned. Provided comfort, support and prayer to/for patient and family. Remained with family.  Wife, son and daughter and law present at bedside.

## 2024-09-28 NOTE — Plan of Care (Signed)
°  Problem: Education: Goal: Knowledge of General Education information will improve Description: Including pain rating scale, medication(s)/side effects and non-pharmacologic comfort measures Outcome: Progressing   Problem: Health Behavior/Discharge Planning: Goal: Ability to manage health-related needs will improve Outcome: Progressing   Problem: Clinical Measurements: Goal: Ability to maintain clinical measurements within normal limits will improve Outcome: Progressing Goal: Will remain free from infection Outcome: Progressing Goal: Diagnostic test results will improve Outcome: Progressing Goal: Respiratory complications will improve Outcome: Progressing Goal: Cardiovascular complication will be avoided Outcome: Progressing   Problem: Activity: Goal: Risk for activity intolerance will decrease Outcome: Progressing   Problem: Nutrition: Goal: Adequate nutrition will be maintained Outcome: Progressing   Problem: Coping: Goal: Level of anxiety will decrease Outcome: Progressing   Problem: Elimination: Goal: Will not experience complications related to bowel motility Outcome: Progressing Goal: Will not experience complications related to urinary retention Outcome: Progressing   Problem: Pain Managment: Goal: General experience of comfort will improve and/or be controlled Outcome: Progressing   Problem: Safety: Goal: Ability to remain free from injury will improve Outcome: Progressing   Problem: Skin Integrity: Goal: Risk for impaired skin integrity will decrease Outcome: Progressing   Problem: Education: Goal: Understanding of CV disease, CV risk reduction, and recovery process will improve Outcome: Progressing   Problem: Activity: Goal: Ability to return to baseline activity level will improve Outcome: Progressing   Problem: Cardiovascular: Goal: Ability to achieve and maintain adequate cardiovascular perfusion will improve Outcome: Progressing Goal:  Vascular access site(s) Level 0-1 will be maintained Outcome: Progressing   Problem: Health Behavior/Discharge Planning: Goal: Ability to safely manage health-related needs after discharge will improve Outcome: Progressing   Problem: Cardiac: Goal: Ability to achieve and maintain adequate cardiopulmonary perfusion will improve Outcome: Progressing Goal: Vascular access site(s) Level 0-1 will be maintained Outcome: Progressing   Problem: Fluid Volume: Goal: Ability to achieve a balanced intake and output will improve Outcome: Progressing   Problem: Physical Regulation: Goal: Complications related to the disease process, condition or treatment will be avoided or minimized Outcome: Progressing   Problem: Respiratory: Goal: Will regain and/or maintain adequate ventilation Outcome: Progressing   Problem: Coping: Goal: Ability to adjust to condition or change in health will improve Outcome: Progressing   Problem: Fluid Volume: Goal: Ability to maintain a balanced intake and output will improve Outcome: Progressing   Problem: Health Behavior/Discharge Planning: Goal: Ability to identify and utilize available resources and services will improve Outcome: Progressing Goal: Ability to manage health-related needs will improve Outcome: Progressing   Problem: Metabolic: Goal: Ability to maintain appropriate glucose levels will improve Outcome: Progressing   Problem: Nutritional: Goal: Maintenance of adequate nutrition will improve Outcome: Progressing Goal: Progress toward achieving an optimal weight will improve Outcome: Progressing   Problem: Skin Integrity: Goal: Risk for impaired skin integrity will decrease Outcome: Progressing   Problem: Tissue Perfusion: Goal: Adequacy of tissue perfusion will improve Outcome: Progressing

## 2024-09-28 NOTE — Progress Notes (Addendum)
 Patient ID: [name] [name], male   DOB: 01-28-1946, 79 y.o.   MRN: [id number]     Advanced Heart Failure Rounding Note  Cardiologist: [name], MD  AHF: Dr. Zenaida  Chief Complaint: A/c Systolic HF Patient Profile   79 y/o male w/ chronic systolic heart failure w/ biventricular dysfunction d/t mixed ischemic/nonischemic CM, CAD, severe functional MR and permanent atrial fibrillation admitted w/ a/c CHF w/ NYHA Class IV symptoms. Now w/ low output. Initial Co-ox 47%. Started on Milrinone .   Subjective:    12/9 S/P Impella 5.5 Placement.   Continues on milrinone  0.25 mcg + amio 30 mg/min.  He is in rate-controlled atrial fibrillation 90s- low 100s with intt rate increases as high as 170 with activity. Co-ox 68%, CVP 8/9. Creatinine 2.02 => 1.87 => 1.78 today.   Impella 5.5 Flow (Liters/min): 4.3 Liters/min Performance Level: P7 LDH 144 => 142 => 146> 168 Plts 153 => 147 On heparin  gtt  On Zosyn  for prostatitis.    Feels ok right now, family at the bedside. Didn't feel too well earlier during ambulation. HR up to 150s.   Objective:    Weight Range: 77.9 kg Body mass index is 24.64 kg/m.   Vital Signs:   Temp:  [97.6 F (36.4 C)-98.7 F (37.1 C)] 98.5 F (36.9 C) 2024-10-06 0815) Pulse Rate:  [85-97] 87 06-Oct-2024 0830) Resp:  [16-27] 24 06-Oct-2024 0830) BP: (102-142)/(72-107) 134/88 06-Oct-2024 0800) SpO2:  [92 %-97 %] 95 % October 06, 2024 0830) Last BM Date : 10-06-2024  Weight change: Filed Weights   09/08/24 0500 09/09/24 0500 09/10/24 0600  Weight: 75.7 kg 76.2 kg 77.9 kg   Intake/Output:  Intake/Output Summary (Last 24 hours) at 10-06-2024 0850 Last data filed at 2024/10/06 0800 Gross per 24 hour  Intake 1807.11 ml  Output 1020 ml  Net 787.11 ml    Physical Exam  General:  elderly appearing.  No respiratory difficulty Neck: JVD ~10 cm.  Cor: Irregular rate & rhythm. + Impella. Impella site stable.  Lungs: clear, diminished bases Extremities: +2 BLE edema  Neuro: alert &  oriented x 3. Affect pleasant.  Telemetry    A fib 90s-low 100s. Up to 170s briefly with activity (Personally reviewed)    Labs    CBC Recent Labs    09/09/24 0509 09/10/24 0435  WBC 9.8 10.2  HGB 10.6* 10.1*  HCT 30.0* 29.4*  MCV 93.5 94.2  PLT 153 147*   Basic Metabolic Panel Recent Labs    87/85/74 0435 10-06-24 0523  NA 131* 133*  K 3.5 4.1  CL 94* 98  CO2 27 28  GLUCOSE 145* 95  BUN 23 18  CREATININE 1.78* 1.78*  CALCIUM 8.1* 8.3*  MG 2.0 1.9   Liver Function Tests Recent Labs    09/10/24 0435 10-06-24 0523  AST 27 34  ALT 24 34  ALKPHOS 76 80  BILITOT 1.4* 1.6*  PROT 5.5* 5.9*  ALBUMIN 2.7* 3.0*   BNP (last 3 results) Recent Labs    08/27/24 1907 08/28/24 0204  BNP 1,633.9* 1,952.5*   Medications:    Scheduled Medications:  arformoterol   15 mcg Nebulization BID   Chlorhexidine  Gluconate Cloth  6 each Topical Daily   feeding supplement  237 mL Oral TID BM   levothyroxine   137 mcg Oral QAC breakfast   pravastatin   40 mg Oral QHS   revefenacin   175 mcg Nebulization Daily   sodium chloride  flush  10-40 mL Intracatheter Q12H     Infusions:  amiodarone  30 mg/hr (2024-10-08 0700)   heparin  1,450 Units/hr (10-08-2024 0700)   milrinone  0.25 mcg/kg/min (2024/10/08 0700)   piperacillin -tazobactam (ZOSYN )  IV 12.5 mL/hr at 10-08-24 0700   sodium bicarbonate  25 mEq (Impella PURGE) in dextrose  5 % 1000 mL bag      PRN Medications: sodium chloride , acetaminophen  **OR** acetaminophen  (TYLENOL ) oral liquid 160 mg/5 mL **OR** acetaminophen , docusate sodium , fentaNYL  (SUBLIMAZE ) injection, melatonin, ondansetron  (ZOFRAN ) IV, oxyCODONE , polyethylene glycol, sodium chloride  flush  Assessment/Plan   1. Acute on Chronic Systolic Heart Failure w/ Low Output  - mixed ischemic/idiopathic CM  - Echo 2/25: LVEF 25 to 30%, moderate to severely dilated, mildly reduced RV function - Echo 12/1 EF <20%, RV not well visualized  - RHC 12/5 (on milrinone  0.25): Well  compensated. mildly elevated L sided filling and PA pressures with mildly reduced CO by TD 4.3/2.3 - Initial co-ox 47%. Started on milrinone  0.25. CO-OX remained suboptimal. Escalation of care 12/9--> MCS Impella 5.5 placed.  - Impella at P7 Flow 4.3.  On heparin  gtt, mgmt per PharmD.  - Continue milrinone  0.25 mcg/kg/min, co-ox stable at 68%.   - CVP 8/9. Weights up and down, RN reports standing weights every morning. Will give 60 IV lasix  x1 today and order UNNA boots.  - off digoxin  given elevated level and AKI. - other GDMT limited by renal function - Discussed at [facility], limited by renal dysfunction. Creatinine 2.02 => 1.87 => 1.78 today. Working towards LVAD but want to make sure renal function will be adequate. Needs to walk in halls regularly, encourage po intake.   2. Permanent Afib  - Continue amiodarone  30 mg/hr. Using for rate control.  - Continue heparin  gtt.    3. Severe MR - functional  - LV cavity more dilated on this admission echo in setting of a/c CHF exacerbation, LVIDs 7.7 cm (6.40 cm)   4. VT/NSVT - in setting of worsening HF - increased frequency of NSVT runs  - keep K > 4.0 and Mg > 2.0 - Continue IV amiodarone  at 30 mg/hr.  - he previously declined ICD. - PCXR today    5. CAD - [facility] 08/31/2023: LMCA with 20% distal stenosis. LAD with sequential 50% proximal and mid lesions. LCx with 30% mid stenosis, and proximal/mid RCA with 30% disease and 70% stenosis at the ostium of AM1  - continue statin - Has not been on ASA given need for anticoagulation.   6. AKI - b/l SCr 1.4 - bumped to 2.6-->2.5 - Creatinine 2.02 => 1.87 => 1.78> 1.78 today  7. Hypokalemia/hypomagnesemia - 4.1 today - will replete Mg today  8. H/o Malignant Melanoma - occipital scalp, s/p excision @ [facility] in 2023 - Never followed up for surveillance head CTs  - CT head reassuring  9. Prostatitis - Possible prostate abscess.  - He is on Zosyn  for 10 day course.   10. Nausea - Amiodarone   decreased 12/11 - Zofran  - Continue Ensure.  - Has been improved on lower amiodarone  dose.   Discussed with HF MD and LVAD coordinator who discussed with Dr. Daniel. Plan for [facility] tomorrow morning to readdress LVAD.   Length of Stay: 14  CRITICAL CARE Performed by: Beckey LITTIE Coe   Total critical care time: 16 minutes  Critical care time was exclusive of separately billable procedures and treating other patients.  Critical care was necessary to treat or prevent imminent or life-threatening deterioration.  Critical care was time spent personally by me on the following activities: development  of treatment plan with patient and/or surrogate as well as nursing, discussions with consultants, evaluation of patient's response to treatment, examination of patient, obtaining history from patient or surrogate, ordering and performing treatments and interventions, ordering and review of laboratory studies, ordering and review of radiographic studies, pulse oximetry and re-evaluation of patient's condition.   Beckey LITTIE Coe 2024-09-26 8:50 AM  Patient seen with NP, I formulated the plan and agree with the above note.   Impella 5.5 at P7 with flow 4.4.  He is also on milrinone  0.25.  Co-ox 68% today.  CVP 9-10.  Creatinine stable at 1.78.   Poor appetite but drinking 2 Ensures/day.  Walking in hall.   General: NAD Neck: JVP 10 cm, no thyromegaly or thyroid  nodule.  Lungs: Clear to auscultation bilaterally with normal respiratory effort. CV: Nondisplaced PMI.  Heart regular S1/S2, no S3/S4, no murmur.  1+ edema to knees.   Abdomen: Soft, nontender, no hepatosplenomegaly, no distention.  Skin: Intact without lesions or rashes.  Neurologic: Alert and oriented x 3.  Psych: Normal affect. Extremities: No clubbing or cyanosis.  HEENT: Normal.   Impella stable flow, platelets and LDH remain stable.  Continue current P7 and milrinone  0.25.  Co-ox good.   Mild volume overload, will give Lasix  60 mg IV x 1  today and follow response.  Being careful with renal function, stable at 1.78 today.  Continue amiodarone  gtt and heparin  gtt with permanent AF (amiodarone  for rate control).   Will discuss at [facility] tomorrow.  If creatinine remains stable, hopefully can set up OR time for LVAD.   CRITICAL CARE Performed by: [name]  Total critical care time: 35 minutes  Critical care time was exclusive of separately billable procedures and treating other patients.  Critical care was necessary to treat or prevent imminent or life-threatening deterioration.  Critical care was time spent personally by me on the following activities: development of treatment plan with patient and/or surrogate as well as nursing, discussions with consultants, evaluation of patient's response to treatment, examination of patient, obtaining history from patient or surrogate, ordering and performing treatments and interventions, ordering and review of laboratory studies, ordering and review of radiographic studies, pulse oximetry and re-evaluation of patient's condition.  [name] 2024-09-26 9:40 AM

## 2024-09-28 DEATH — deceased

## 2025-01-04 ENCOUNTER — Ambulatory Visit
